# Patient Record
Sex: Male | Born: 1945
Health system: Southern US, Community
[De-identification: ages and names within clinical notes are randomized; demographics above are authoritative.]

## PROBLEM LIST (undated history)

## (undated) DIAGNOSIS — Z8673 Personal history of transient ischemic attack (TIA), and cerebral infarction without residual deficits: Secondary | ICD-10-CM

## (undated) DIAGNOSIS — E785 Hyperlipidemia, unspecified: Secondary | ICD-10-CM

## (undated) DIAGNOSIS — M47812 Spondylosis without myelopathy or radiculopathy, cervical region: Secondary | ICD-10-CM

## (undated) DIAGNOSIS — Z8669 Personal history of other diseases of the nervous system and sense organs: Secondary | ICD-10-CM

## (undated) DIAGNOSIS — D151 Benign neoplasm of heart: Secondary | ICD-10-CM

## (undated) DIAGNOSIS — C439 Malignant melanoma of skin, unspecified: Secondary | ICD-10-CM

## (undated) DIAGNOSIS — I10 Essential (primary) hypertension: Secondary | ICD-10-CM

## (undated) DIAGNOSIS — Z8679 Personal history of other diseases of the circulatory system: Secondary | ICD-10-CM

## (undated) HISTORY — PX: BACK SURGERY: SHX140

## (undated) HISTORY — PX: APPENDECTOMY: SHX54

## (undated) HISTORY — DX: Personal history of other diseases of the nervous system and sense organs: Z86.69

## (undated) HISTORY — PX: ROTATOR CUFF REPAIR: SHX139

## (undated) HISTORY — PX: NOSE SURGERY: SHX723

## (undated) HISTORY — DX: Spondylosis without myelopathy or radiculopathy, cervical region: M47.812

## (undated) HISTORY — DX: Hyperlipidemia, unspecified: E78.5

## (undated) HISTORY — DX: Essential (primary) hypertension: I10

## (undated) HISTORY — DX: Personal history of other diseases of the circulatory system: Z86.79

## (undated) HISTORY — PX: NASAL SEPTUM SURGERY: SHX37

---

## 2000-04-27 ENCOUNTER — Encounter: Payer: Self-pay | Admitting: Orthopedic Surgery

## 2000-04-27 ENCOUNTER — Ambulatory Visit (HOSPITAL_COMMUNITY): Admission: RE | Admit: 2000-04-27 | Discharge: 2000-04-27 | Payer: Self-pay | Admitting: Orthopedic Surgery

## 2000-06-13 ENCOUNTER — Ambulatory Visit (HOSPITAL_COMMUNITY): Admission: RE | Admit: 2000-06-13 | Discharge: 2000-06-13 | Payer: Self-pay | Admitting: Orthopedic Surgery

## 2000-06-13 ENCOUNTER — Encounter: Payer: Self-pay | Admitting: Orthopedic Surgery

## 2000-06-20 ENCOUNTER — Ambulatory Visit (HOSPITAL_COMMUNITY): Admission: RE | Admit: 2000-06-20 | Discharge: 2000-06-20 | Payer: Self-pay | Admitting: Orthopedic Surgery

## 2000-06-20 ENCOUNTER — Encounter: Payer: Self-pay | Admitting: Orthopedic Surgery

## 2000-07-24 ENCOUNTER — Inpatient Hospital Stay (HOSPITAL_COMMUNITY): Admission: RE | Admit: 2000-07-24 | Discharge: 2000-07-25 | Payer: Self-pay | Admitting: Neurosurgery

## 2000-07-24 ENCOUNTER — Encounter: Payer: Self-pay | Admitting: Neurosurgery

## 2000-08-09 ENCOUNTER — Encounter: Admission: RE | Admit: 2000-08-09 | Discharge: 2000-08-09 | Payer: Self-pay | Admitting: Neurosurgery

## 2000-08-09 ENCOUNTER — Encounter: Payer: Self-pay | Admitting: Neurosurgery

## 2001-01-03 ENCOUNTER — Inpatient Hospital Stay (HOSPITAL_COMMUNITY): Admission: AD | Admit: 2001-01-03 | Discharge: 2001-01-05 | Payer: Self-pay | Admitting: Orthopedic Surgery

## 2003-05-21 ENCOUNTER — Ambulatory Visit (HOSPITAL_COMMUNITY): Admission: RE | Admit: 2003-05-21 | Discharge: 2003-05-21 | Payer: Self-pay | Admitting: Neurosurgery

## 2007-04-19 ENCOUNTER — Encounter: Admission: RE | Admit: 2007-04-19 | Discharge: 2007-04-19 | Payer: Self-pay | Admitting: Neurosurgery

## 2008-05-20 ENCOUNTER — Encounter: Admission: RE | Admit: 2008-05-20 | Discharge: 2008-05-20 | Payer: Self-pay | Admitting: Neurosurgery

## 2008-06-09 ENCOUNTER — Encounter: Admission: RE | Admit: 2008-06-09 | Discharge: 2008-06-09 | Payer: Self-pay | Admitting: Neurosurgery

## 2008-06-29 ENCOUNTER — Encounter: Admission: RE | Admit: 2008-06-29 | Discharge: 2008-06-29 | Payer: Self-pay | Admitting: Neurosurgery

## 2009-03-12 ENCOUNTER — Encounter: Admission: RE | Admit: 2009-03-12 | Discharge: 2009-03-12 | Payer: Self-pay | Admitting: Neurosurgery

## 2009-05-06 ENCOUNTER — Encounter: Admission: RE | Admit: 2009-05-06 | Discharge: 2009-05-06 | Payer: Self-pay | Admitting: Neurosurgery

## 2009-05-26 ENCOUNTER — Encounter: Admission: RE | Admit: 2009-05-26 | Discharge: 2009-05-26 | Payer: Self-pay | Admitting: Neurosurgery

## 2009-06-06 ENCOUNTER — Encounter: Admission: RE | Admit: 2009-06-06 | Discharge: 2009-06-06 | Payer: Self-pay | Admitting: Neurosurgery

## 2009-07-07 ENCOUNTER — Inpatient Hospital Stay (HOSPITAL_COMMUNITY): Admission: RE | Admit: 2009-07-07 | Discharge: 2009-07-08 | Payer: Self-pay | Admitting: Neurosurgery

## 2010-03-22 ENCOUNTER — Ambulatory Visit: Payer: Self-pay | Admitting: Cardiology

## 2010-09-20 ENCOUNTER — Ambulatory Visit (INDEPENDENT_AMBULATORY_CARE_PROVIDER_SITE_OTHER): Payer: Medicare Other | Admitting: Cardiology

## 2010-09-20 DIAGNOSIS — I119 Hypertensive heart disease without heart failure: Secondary | ICD-10-CM

## 2010-09-20 DIAGNOSIS — E7800 Pure hypercholesterolemia, unspecified: Secondary | ICD-10-CM

## 2010-09-20 DIAGNOSIS — Z79899 Other long term (current) drug therapy: Secondary | ICD-10-CM

## 2010-10-10 LAB — URINALYSIS, MICROSCOPIC ONLY
Leukocytes, UA: NEGATIVE
Protein, ur: NEGATIVE mg/dL
Specific Gravity, Urine: 1.008 (ref 1.005–1.030)
pH: 6.5 (ref 5.0–8.0)

## 2010-10-10 LAB — COMPREHENSIVE METABOLIC PANEL
ALT: 29 U/L (ref 0–53)
AST: 21 U/L (ref 0–37)
BUN: 10 mg/dL (ref 6–23)
Calcium: 9.1 mg/dL (ref 8.4–10.5)
Chloride: 103 mEq/L (ref 96–112)
Creatinine, Ser: 0.84 mg/dL (ref 0.4–1.5)
GFR calc Af Amer: >60 mL/min (ref >60)
Glucose, Bld: 107 mg/dL — ABNORMAL HIGH (ref 70–99)
Potassium: 4.3 mEq/L (ref 3.5–5.1)
Sodium: 139 mEq/L (ref 135–145)
Total Bilirubin: 0.7 mg/dL (ref 0.3–1.2)

## 2010-10-10 LAB — DIFFERENTIAL
Eosinophils Absolute: 0.1 10*3/uL (ref 0.0–0.7)
Eosinophils Relative: 2 % (ref 0–5)
Lymphocytes Relative: 22 % (ref 12–46)
Neutro Abs: 4.5 10*3/uL (ref 1.7–7.7)
Neutrophils Relative %: 67 % (ref 43–77)

## 2010-10-10 LAB — CBC
Platelets: 183 10*3/uL (ref 150–400)
RDW: 14.1 % (ref 11.5–15.5)

## 2010-11-25 NOTE — H&P (Signed)
[place]. [facility]  Patient:    Matthew Sullivan, Matthew Sullivan                        MRN: [id number] Adm. Date:  07/24/00 Attending:  Payton Doughty, M.D.                         History and Physical  ADMISSION DIAGNOSIS:  Cervical spondylosis with myelopathy at C3-4, C4-5, and C5-6.  HISTORY OF PRESENT ILLNESS:  A 65 year old right-handed white gentleman who for the past three months has increasing discomfort of his left trapezius, has shoulder discomfort that has been tended to by Dr. Teressa Senter.  During the course of his investigation of his shoulder pain, he had an MRI of the neck which showed severe spondylitic change with cord compression and he has referred himself to me.  PAST MEDICAL HISTORY:  Otherwise reasonably unremarkable save for an appendectomy in 1989 and some attempts to cut his fingers off with a saw. This was attended to by Dr. Teressa Senter.  ALLERGIES:  No known drug allergies.  MEDICATIONS:  He is currently using Celebrex as needed and Vicodin on p.r.n. basis.  FAMILY HISTORY:  Mother is 22 and in good health with spondylitic disease in her lumbar spine.  Father is deceased, cause is not given.  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  REVIEW OF SYSTEMS:  Remarkable for shoulder pain.  He wears glasses, has neck pain, and notices his arm strength is going away.  HEENT:  Within normal limits.  He has limited range of motion in his neck, although, there is no Lhermittes.  CHEST:  Clear.  HEART:  Regular rate and rhythm.  ABDOMEN:  Nontender with no hepatosplenomegaly.  EXTREMITIES:  Without clubbing or cyanosis.  GENITOURINARY:  Deferred.  EXTREMITIES:  Peripheral pulses are good.  NEUROLOGICAL:  He is awake, alert, and oriented.  His Cranial nerves II-XII intact.  Motor examination shows 5/5 strength throughout the upper and lower extremities save for the left biceps which is 4+-5-/5.   He has upgoing toe on the right at this time, previously he did not.  Hoffmans is positive.  MRI shows an 8 mm canal at 3-4, 4-5, and 6 mm canal at 5-6.  6-7 is in good shape as well as 2-3.  IMPRESSION:  Cervical spondylitic disease with myelomalacia and myelopathy. The plan is for an anterior cervical diskectomy and fusion at C3-4, C4-5, and C5-6.  The risks and benefits of this approach had been discussed with him and he wishes to proceed. DD:  07/24/00 TD:  07/24/00 Job: [id number]

## 2010-11-25 NOTE — Op Note (Signed)
[place]. [facility]  Patient:    Matthew Sullivan, Matthew Sullivan                      MRN: [id number] Proc. Date: 07/24/00 Adm. Date:  [id number] Attending:  Emeterio Reeve                           Operative Report  PREOPERATIVE DIAGNOSIS:  Spondylosis with cord compression at C3-4, C4-5 and C5-6.  POSTOPERATIVE DIAGNOSIS:  Spondylosis with cord compression at C3-4, C4-5 and C5-6.  OPERATION PERFORMED:  Anterior cervical diskectomy and fusion at C3-4, C4-5 and C5-6 with a tether plate.  SURGEON:  Payton Doughty, M.D.  ANESTHESIA:  General endotracheal.  PREP:  Sterile Betadine prep and scrub with alcohol wipe.  COMPLICATIONS:  None.  ASSISTANT:  Kyle L. Cabbell, M.D.  DESCRIPTION OF PROCEDURE:  The patient is a 65 year old, right-handed, white gentleman with severe cervical spondylosis at 3-4, 4-5 and 5-6. The patient was taken to the operating room, smoothly anesthetized and intubated, and placed supine on the operating table.  Following shave, prep and drape in the usual sterile fashion.  The skin was incised starting one fingerbreadth below the level of the carotid tubercle extending parallel to the sternocleidomastoid muscle for approximately 10 cm.  The platysma was identified, elevated, divided and undermined.  The sternocleidomastoid was identified and medial dissection revealed the carotid artery to retract lateral to the left, trachea and esophagus retracted laterally to the right, exposing the bones of the anterior cervical spine.  Marker was placed and intraoperative x-ray obtained correctness of level.  The longus colli was taken down and the self-retaining retractor Shadowline retractor placed. Diskectomy was carried out at C3-4, C4-5 and C5-6, first under gross observation and then under the operating microscope.  The neural foramina and then epidural space at each level was carefully explored.  The posterior longitudinal ligament was divided.  At  C3-4 there was disk into the anterior epidural space which was removed without difficulty.  At 4-5 and 5-6, there was severe spondylosis with biforaminal compression.  The osteophytes were removed from the neural foramina.  Following complete decompression of nerves roots at all three levels, a 7 mm bone graft was fashioned from patellar allograft and tapped into place.  Following  placement of bone graft, a 40 mm tether plate was then placed with two screws in C3, one in C4, one in C5 and two in C6.  Intraoperative x-ray showed good placement of bone grafts, plate and screws.  The wound was irrigated and hemostasis assured.  The platysma was reapproximated with 3-0 Vicryl in interrupted fashion.  Subcutaneous tissues were reapproximated with 3-0 Vicryl in interrupted fashion.  The skin was closed with 4-0 Vicryl in a running subcuticular fashion.  Benzoin and Steri-Strips were placed and made occlusive with Telfa and Op-Site.  The patient was then placed in an Aspen collar and returned to the recovery room in good condition. DD:  07/24/00 TD:  07/24/00 Job: [id number]

## 2011-02-08 ENCOUNTER — Other Ambulatory Visit: Payer: Self-pay | Admitting: Cardiology

## 2011-02-08 DIAGNOSIS — E785 Hyperlipidemia, unspecified: Secondary | ICD-10-CM

## 2011-02-09 ENCOUNTER — Other Ambulatory Visit: Payer: Self-pay | Admitting: *Deleted

## 2011-02-09 DIAGNOSIS — E7800 Pure hypercholesterolemia, unspecified: Secondary | ICD-10-CM

## 2011-02-09 MED ORDER — ATORVASTATIN CALCIUM 80 MG PO TABS: ORAL_TABLET | ORAL | Status: DC

## 2011-02-09 NOTE — Telephone Encounter (Signed)
Refilled generic lipitor 

## 2011-02-20 ENCOUNTER — Other Ambulatory Visit: Payer: Self-pay | Admitting: Neurosurgery

## 2011-02-20 DIAGNOSIS — M47816 Spondylosis without myelopathy or radiculopathy, lumbar region: Secondary | ICD-10-CM

## 2011-03-08 ENCOUNTER — Other Ambulatory Visit: Payer: Medicare Other

## 2011-03-09 ENCOUNTER — Ambulatory Visit
Admission: RE | Admit: 2011-03-09 | Discharge: 2011-03-09 | Disposition: A | Discharge status: Home / Self Care | Payer: Medicare Other | Source: Ambulatory Visit | Attending: Neurosurgery | Admitting: Neurosurgery

## 2011-03-09 DIAGNOSIS — M47816 Spondylosis without myelopathy or radiculopathy, lumbar region: Secondary | ICD-10-CM

## 2011-03-14 ENCOUNTER — Other Ambulatory Visit: Payer: Self-pay | Admitting: Cardiology

## 2011-03-14 DIAGNOSIS — I10 Essential (primary) hypertension: Secondary | ICD-10-CM

## 2011-03-14 DIAGNOSIS — E785 Hyperlipidemia, unspecified: Secondary | ICD-10-CM

## 2011-04-20 ENCOUNTER — Ambulatory Visit: Payer: Medicare Other | Admitting: Cardiology

## 2011-04-20 ENCOUNTER — Other Ambulatory Visit: Payer: Medicare Other | Admitting: *Deleted

## 2011-05-04 ENCOUNTER — Encounter: Payer: Self-pay | Admitting: Cardiology

## 2011-05-08 ENCOUNTER — Ambulatory Visit: Payer: Medicare Other | Admitting: Cardiology

## 2011-05-08 ENCOUNTER — Other Ambulatory Visit: Payer: Medicare Other | Admitting: *Deleted

## 2011-06-22 ENCOUNTER — Encounter: Payer: Self-pay | Admitting: Cardiology

## 2011-06-22 ENCOUNTER — Ambulatory Visit
Admission: RE | Admit: 2011-06-22 | Discharge: 2011-06-22 | Disposition: A | Discharge status: Home / Self Care | Payer: Medicare Other | Source: Ambulatory Visit | Attending: Cardiology | Admitting: Cardiology

## 2011-06-22 ENCOUNTER — Other Ambulatory Visit (INDEPENDENT_AMBULATORY_CARE_PROVIDER_SITE_OTHER): Payer: Medicare Other | Admitting: *Deleted

## 2011-06-22 ENCOUNTER — Ambulatory Visit (INDEPENDENT_AMBULATORY_CARE_PROVIDER_SITE_OTHER): Payer: Medicare Other | Admitting: Cardiology

## 2011-06-22 VITALS — BP 128/78 | HR 60 | Ht 72.0 in | Wt 208.0 lb

## 2011-06-22 DIAGNOSIS — I119 Hypertensive heart disease without heart failure: Secondary | ICD-10-CM

## 2011-06-22 DIAGNOSIS — E7800 Pure hypercholesterolemia, unspecified: Secondary | ICD-10-CM | POA: Insufficient documentation

## 2011-06-22 LAB — BASIC METABOLIC PANEL
BUN: 14 mg/dL (ref 6–23)
CO2: 29 mEq/L (ref 19–32)
Chloride: 102 mEq/L (ref 96–112)
GFR: 103.97 mL/min (ref >60.00)
Glucose, Bld: 99 mg/dL (ref 70–99)
Sodium: 140 mEq/L (ref 135–145)

## 2011-06-22 LAB — LIPID PANEL
HDL: 45.9 mg/dL (ref >39.00)
Triglycerides: 84 mg/dL (ref 0.0–149.0)
VLDL: 16.8 mg/dL (ref 0.0–40.0)

## 2011-06-22 LAB — HEPATIC FUNCTION PANEL
AST: 20 U/L (ref 0–37)
Albumin: 4.3 g/dL (ref 3.5–5.2)
Alkaline Phosphatase: 82 U/L (ref 39–117)
Bilirubin, Direct: 0.1 mg/dL (ref 0.0–0.3)
Total Protein: 7.3 g/dL (ref 6.0–8.3)

## 2011-06-22 NOTE — Progress Notes (Signed)
Matthew Sullivan Date of Birth:  Sep 15, 1945 [facility] [address]   [address] [place], [place]  [address] [phone]           Fax   [phone]  HPI: This pleasant 65 year old gentleman is seen for a six-month followup office visit.  He has a history of essential hypertension and history of hypercholesterolemia.  Since last visit has had no new cardiac symptoms.  He denies any chest pain or shortness of breath.  He exercises regularly by hiking in the woods.  He is not having any side effects from his hydrochlorothiazide or Lipitor.  He also remains on a daily aspirin.  Current Outpatient Prescriptions  Medication Sig Dispense Refill  . aspirin 81 MG tablet Take 81 mg by mouth daily.        Marland Kitchen atorvastatin (LIPITOR) 80 MG tablet Takes 1/2 daily  30 tablet  11  . hydrochlorothiazide (MICROZIDE) 12.5 MG capsule Take 12.5 mg by mouth daily.        . Multiple Vitamin (MULTIVITAMIN) tablet Take 1 tablet by mouth daily.          No Known Allergies  There is no problem list on file for this patient.   History  Smoking status  . Former Smoker  . Quit date: 07/10/1977  Smokeless tobacco  . Not on file    History  Alcohol Use No    Family History  Problem Relation Age of Onset  . Cancer Father     Review of Systems: The patient denies any heat or cold intolerance.  No weight gain or weight loss.  The patient denies headaches or blurry vision.  There is no cough or sputum production.  The patient denies dizziness.  There is no hematuria or hematochezia.  The patient denies any muscle aches or arthritis.  The patient denies any rash.  The patient denies frequent falling or instability.  There is no history of depression or anxiety.  All other systems were reviewed and are negative.   Physical Exam: Filed Vitals:   06/22/11 1105  BP: 128/78  Pulse: 60   The general appearance reveals a well-developed well-nourished gentleman in no distress.The  head and neck exam reveals pupils equal and reactive.  Extraocular movements are full.  There is no scleral icterus.  The mouth and pharynx are normal.  The neck is supple.  The carotids reveal no bruits.  The jugular venous pressure is normal.  The  thyroid is not enlarged.  There is no lymphadenopathy.  The chest is clear to percussion and auscultation.  There are no rales or rhonchi.  Expansion of the chest is symmetrical.  The precordium is quiet.  The first heart sound is normal.  The second heart sound is physiologically split.  There is no murmur gallop rub or click.  There is no abnormal lift or heave.  The abdomen is soft and nontender.  The bowel sounds are normal.  The liver and spleen are not enlarged.  There are no abdominal masses.  There are no abdominal bruits.  Extremities reveal good pedal pulses.  There is no phlebitis or edema.  There is no cyanosis or clubbing.  Strength is normal and symmetrical in all extremities.  There is no lateralizing weakness.  There are no sensory deficits.  The skin is warm and dry.  There is no rash.     Assessment / Plan: Continue present regimen.  Blood work today is pending.  His  last chest x-ray was in 2007 and we are getting a followup chest x-ray today to followup on heart size.

## 2011-06-22 NOTE — Patient Instructions (Addendum)
Will have you get chest xray and call you with the results.  Your physician recommends that you continue on your current medications as directed. Please refer to the Current Medication list given to you today.  Your physician wants you to follow-up in: 6 months You will receive a reminder letter in the mail two months in advance. If you don't receive a letter, please call our office to schedule the follow-up appointment.

## 2011-06-22 NOTE — Assessment & Plan Note (Signed)
Patient remains on Lipitor.  He is having no side effects.  Blood work today is pending

## 2011-06-22 NOTE — Assessment & Plan Note (Signed)
The patient denies any headaches or dizzy spells.  No exertional chest pain or shortness of breath.

## 2011-06-23 ENCOUNTER — Telehealth: Payer: Self-pay | Admitting: *Deleted

## 2011-06-23 NOTE — Telephone Encounter (Signed)
Message copied by Eugenia Pancoast on Fri Jun 23, 2011  5:20 PM ------      Message from: Cassell Clement      Created: Thu Jun 22, 2011  8:46 PM       Xray okay.  Lungs clear. Heart okay.

## 2011-06-23 NOTE — Telephone Encounter (Signed)
Adv patient of labs

## 2011-06-23 NOTE — Telephone Encounter (Signed)
Adv patient of cxr report

## 2011-06-23 NOTE — Telephone Encounter (Signed)
Adv patient of labs 

## 2011-06-23 NOTE — Telephone Encounter (Signed)
Message copied by Eugenia Pancoast on Fri Jun 23, 2011  5:18 PM ------      Message from: Cassell Clement      Created: Thu Jun 22, 2011  8:38 PM       Please report.  The labs are stable.  Continue same meds.  Continue careful diet.

## 2011-06-23 NOTE — Telephone Encounter (Signed)
Message copied by Eugenia Pancoast on Fri Jun 23, 2011  5:21 PM ------      Message from: Cassell Clement      Created: Thu Jun 22, 2011  8:45 PM       Please report.  The labs are stable.  Continue same meds.  Continue careful diet.

## 2011-08-01 ENCOUNTER — Ambulatory Visit: Payer: Medicare Other | Admitting: Family Medicine

## 2011-10-16 DIAGNOSIS — Z85820 Personal history of malignant melanoma of skin: Secondary | ICD-10-CM | POA: Diagnosis not present

## 2011-10-16 DIAGNOSIS — L570 Actinic keratosis: Secondary | ICD-10-CM | POA: Diagnosis not present

## 2011-11-06 ENCOUNTER — Other Ambulatory Visit: Payer: Self-pay | Admitting: Cardiology

## 2011-12-22 ENCOUNTER — Ambulatory Visit: Payer: Medicare Other | Admitting: Cardiology

## 2011-12-28 DIAGNOSIS — M47817 Spondylosis without myelopathy or radiculopathy, lumbosacral region: Secondary | ICD-10-CM | POA: Diagnosis not present

## 2012-01-01 ENCOUNTER — Other Ambulatory Visit: Payer: Self-pay | Admitting: Neurosurgery

## 2012-01-01 DIAGNOSIS — M47816 Spondylosis without myelopathy or radiculopathy, lumbar region: Secondary | ICD-10-CM

## 2012-01-05 ENCOUNTER — Other Ambulatory Visit: Payer: Self-pay | Admitting: Neurosurgery

## 2012-01-05 ENCOUNTER — Ambulatory Visit
Admission: RE | Admit: 2012-01-05 | Discharge: 2012-01-05 | Disposition: A | Discharge status: Home / Self Care | Payer: Medicare Other | Source: Ambulatory Visit | Attending: Neurosurgery | Admitting: Neurosurgery

## 2012-01-05 VITALS — BP 146/71 | HR 61

## 2012-01-05 DIAGNOSIS — M47816 Spondylosis without myelopathy or radiculopathy, lumbar region: Secondary | ICD-10-CM

## 2012-01-05 DIAGNOSIS — M47817 Spondylosis without myelopathy or radiculopathy, lumbosacral region: Secondary | ICD-10-CM | POA: Diagnosis not present

## 2012-01-05 MED ORDER — METHYLPREDNISOLONE ACETATE 40 MG/ML INJ SUSP (RADIOLOG
120.0000 mg | Freq: Once | INTRAMUSCULAR | Status: AC
  Administered 2012-01-05: 120 mg via EPIDURAL

## 2012-01-05 MED ORDER — IOHEXOL 180 MG/ML  SOLN
1.0000 mL | Freq: Once | INTRAMUSCULAR | Status: AC | PRN
  Administered 2012-01-05: 1 mL via EPIDURAL

## 2012-01-05 NOTE — Discharge Instructions (Signed)

## 2012-01-18 ENCOUNTER — Ambulatory Visit: Payer: Medicare Other | Admitting: Cardiology

## 2012-02-01 ENCOUNTER — Encounter: Payer: Self-pay | Admitting: Cardiology

## 2012-02-01 ENCOUNTER — Ambulatory Visit (INDEPENDENT_AMBULATORY_CARE_PROVIDER_SITE_OTHER): Payer: Medicare Other | Admitting: Cardiology

## 2012-02-01 VITALS — BP 130/86 | HR 60 | Ht 72.0 in | Wt 216.0 lb

## 2012-02-01 DIAGNOSIS — R5381 Other malaise: Secondary | ICD-10-CM

## 2012-02-01 DIAGNOSIS — E785 Hyperlipidemia, unspecified: Secondary | ICD-10-CM

## 2012-02-01 DIAGNOSIS — E7800 Pure hypercholesterolemia, unspecified: Secondary | ICD-10-CM | POA: Diagnosis not present

## 2012-02-01 DIAGNOSIS — I10 Essential (primary) hypertension: Secondary | ICD-10-CM

## 2012-02-01 DIAGNOSIS — R5383 Other fatigue: Secondary | ICD-10-CM

## 2012-02-01 DIAGNOSIS — I119 Hypertensive heart disease without heart failure: Secondary | ICD-10-CM

## 2012-02-01 LAB — CBC WITH DIFFERENTIAL/PLATELET
Basophils Relative: 0.4 % (ref 0.0–3.0)
Hemoglobin: 13.2 g/dL (ref 13.0–17.0)
Lymphocytes Relative: 21.9 % (ref 12.0–46.0)
MCHC: 33 g/dL (ref 30.0–36.0)
Monocytes Relative: 9.4 % (ref 3.0–12.0)
Neutro Abs: 4.1 10*3/uL (ref 1.4–7.7)
RBC: 4.36 Mil/uL (ref 4.22–5.81)

## 2012-02-01 LAB — BASIC METABOLIC PANEL
Calcium: 9.1 mg/dL (ref 8.4–10.5)
GFR: 87.05 mL/min (ref >60.00)
Glucose, Bld: 122 mg/dL — ABNORMAL HIGH (ref 70–99)
Sodium: 137 mEq/L (ref 135–145)

## 2012-02-01 LAB — HEPATIC FUNCTION PANEL
Albumin: 4.2 g/dL (ref 3.5–5.2)
Alkaline Phosphatase: 64 U/L (ref 39–117)

## 2012-02-01 LAB — LIPID PANEL
HDL: 47.4 mg/dL (ref >39.00)
VLDL: 24.2 mg/dL (ref 0.0–40.0)

## 2012-02-01 LAB — TSH: TSH: 3.06 u[IU]/mL (ref 0.35–5.50)

## 2012-02-01 NOTE — Assessment & Plan Note (Signed)
Patient is on Lipitor 80 mg daily.  He is not having any myalgias.  Blood work today is pending.  Continue same dose for now.

## 2012-02-01 NOTE — Progress Notes (Signed)
Quick Note:  Please make copy of labs for patient visit. Thyroid function is normal. There is no anemia. No cause for his fatigue noted in the lab work ______

## 2012-02-01 NOTE — Patient Instructions (Signed)
Will obtain labs today and call you with the results   Your physician recommends that you continue on your current medications as directed. Please refer to the Current Medication list given to you today.  Your physician wants you to follow-up in: 6 months with fasting labs (lp/bmet/hfp)  You will receive a reminder letter in the mail two months in advance. If you don't receive a letter, please call our office to schedule the follow-up appointment.  

## 2012-02-01 NOTE — Assessment & Plan Note (Signed)
His blood pressure is higher today.  This reflects his weight gain and decreased exercise regimen.  We talked about adding a ACE inhibitor but he would prefer not to add any additional drugs at this time but he will work harder on exercise diet and weight loss instead.

## 2012-02-01 NOTE — Progress Notes (Signed)
Matthew Sullivan Radon Date of Birth:  10-22-1945 [facility] [address] [place], [place]  [address] [phone]  Fax   [phone]  HPI: This pleasant 66 year old gentleman is seen for a six-month followup office visit.  He has a past history of hypercholesterolemia and essential hypertension.  He also has a history of chronic back pain.  Last year he had surgery on L4.  Now he is having additional pain coming from L5.  He had a injection of steroids about 6 weeks ago.  He sees Dr. Channing Mutters.  The patient is hoping to avoid further surgery.  Because of his back pain his exercise regimen has been decreased and he has gained 8 pounds.  He complains of malaise fatigue and lack of energy and we are going to check TSH and CBC today as well as his fasting labs.  The patient is also concerned that he may have low testosterone level.  We had checked it several years ago and it was normal at bedtime.  Medicare will not pay for it from this office.  He may want to see urology for further evaluation.  Current Outpatient Prescriptions  Medication Sig Dispense Refill  . aspirin 81 MG tablet Take 81 mg by mouth daily.        Marland Kitchen atorvastatin (LIPITOR) 80 MG tablet Takes 1/2 daily  30 tablet  11  . hydrochlorothiazide (MICROZIDE) 12.5 MG capsule TAKE ONE CAPSULE DAILY FOR BLOOD PRESSURE.  30 capsule  11  . Multiple Vitamin (MULTIVITAMIN) tablet Take 1 tablet by mouth daily.          No Known Allergies  Patient Active Problem List  Diagnosis  . Benign hypertensive heart disease without heart failure  . Pure hypercholesterolemia    History  Smoking status  . Former Smoker  . Quit date: 07/10/1977  Smokeless tobacco  . Not on file    History  Alcohol Use No    Family History  Problem Relation Age of Onset  . Cancer Father     Review of Systems: The patient denies any heat or cold intolerance.  No weight gain or weight loss.  The patient denies headaches or blurry vision.   There is no cough or sputum production.  The patient denies dizziness.  There is no hematuria or hematochezia.  The patient denies any muscle aches or arthritis.  The patient denies any rash.  The patient denies frequent falling or instability.  There is no history of depression or anxiety.  All other systems were reviewed and are negative.   Physical Exam: Filed Vitals:   02/01/12 0908  BP: 130/86  Pulse: 60   the general appearance reveals a well-developed well-nourished gentleman in no distress.The head and neck exam reveals pupils equal and reactive.  Extraocular movements are full.  There is no scleral icterus.  The mouth and pharynx are normal.  The neck is supple.  The carotids reveal no bruits.  The jugular venous pressure is normal.  The  thyroid is not enlarged.  There is no lymphadenopathy.  The chest is clear to percussion and auscultation.  There are no rales or rhonchi.  Expansion of the chest is symmetrical.  The precordium is quiet.  The first heart sound is normal.  The second heart sound is physiologically split.  There is no murmur gallop rub or click.  There is no abnormal lift or heave.  The abdomen is soft and nontender.  The bowel sounds are normal.  The  liver and spleen are not enlarged.  There are no abdominal masses.  There are no abdominal bruits.  Extremities reveal good pedal pulses.  There is no phlebitis or edema.  There is no cyanosis or clubbing.  Strength is normal and symmetrical in all extremities.  There is no lateralizing weakness.  There are no sensory deficits.  The skin is warm and dry.  There is no rash.      Assessment / Plan: Continue same medication.  Work harder on weight loss.  Continue walking.  Recheck 6 months for followup office visit lipid panel hepatic function panel and basal metabolic panel.  Today because of his malaise and fatigue we are also checking CBC and TSH

## 2012-02-02 ENCOUNTER — Telehealth: Payer: Self-pay | Admitting: *Deleted

## 2012-02-02 NOTE — Telephone Encounter (Signed)
Message copied by Burnell Blanks on Fri Feb 02, 2012  2:15 PM ------      Message from: Cassell Clement      Created: Thu Feb 01, 2012  4:32 PM       Please make copy of labs for patient visit.  Thyroid function is normal.  There is no anemia.  No cause for his fatigue noted in the lab work

## 2012-02-02 NOTE — Telephone Encounter (Signed)
Mailed copy of labs and left message to call if any questions  

## 2012-03-27 ENCOUNTER — Telehealth: Payer: Self-pay | Admitting: Cardiology

## 2012-03-27 ENCOUNTER — Other Ambulatory Visit: Payer: Self-pay | Admitting: Cardiology

## 2012-03-27 NOTE — Telephone Encounter (Signed)
Spoke with wife and she stated patients fatigue is getting worse not better.   Per wife patient doesn't really snore, but wakes up tired.  She doesn't know what to do next.  Will forward to  Dr. [name] for review aware it will be next week.   Dr. [name] is PCP

## 2012-03-27 NOTE — Telephone Encounter (Signed)
New Problem:    PAtient's wife called in with questions regarding the patient's labs form 02/01/12.  Please call back.

## 2012-04-01 NOTE — Telephone Encounter (Signed)
Advised patients wife of changes. After reviewing medications notice his HCTZ is a capsule that he takes daily.  Will forward to  Dr. [name] for review

## 2012-04-01 NOTE — Telephone Encounter (Signed)
Left message to call back  

## 2012-04-01 NOTE — Telephone Encounter (Signed)
Would decrease the present dose of HCTZ by half. If he is taking one daily, change to QOD

## 2012-04-01 NOTE — Telephone Encounter (Signed)
His fatigue may be coming from some of his medication.  The elevated blood sugar may be secondary in part to the hydrochlorothiazide.  The Lipitor may also be causing some fatigue.  I want him to decrease his Lipitor to just one half tablet every other day.  I want him to decrease his hydrochlorothiazide to one half tablet every other day.  Watch carbohydrates carefully in his diet and try to lose weight.

## 2012-04-02 NOTE — Telephone Encounter (Signed)
Advised wife  

## 2012-04-08 DIAGNOSIS — M47817 Spondylosis without myelopathy or radiculopathy, lumbosacral region: Secondary | ICD-10-CM | POA: Diagnosis not present

## 2012-04-08 DIAGNOSIS — M47812 Spondylosis without myelopathy or radiculopathy, cervical region: Secondary | ICD-10-CM | POA: Diagnosis not present

## 2012-04-16 DIAGNOSIS — Z85820 Personal history of malignant melanoma of skin: Secondary | ICD-10-CM | POA: Diagnosis not present

## 2012-04-16 DIAGNOSIS — D235 Other benign neoplasm of skin of trunk: Secondary | ICD-10-CM | POA: Diagnosis not present

## 2012-08-15 DIAGNOSIS — M47817 Spondylosis without myelopathy or radiculopathy, lumbosacral region: Secondary | ICD-10-CM | POA: Diagnosis not present

## 2012-08-15 DIAGNOSIS — M47812 Spondylosis without myelopathy or radiculopathy, cervical region: Secondary | ICD-10-CM | POA: Diagnosis not present

## 2012-08-26 DIAGNOSIS — M6530 Trigger finger, unspecified finger: Secondary | ICD-10-CM | POA: Diagnosis not present

## 2012-11-12 ENCOUNTER — Other Ambulatory Visit: Payer: Self-pay | Admitting: *Deleted

## 2012-11-12 MED ORDER — HYDROCHLOROTHIAZIDE 12.5 MG PO CAPS: 12.5000 mg | ORAL_CAPSULE | Freq: Every day | ORAL | Status: DC

## 2012-11-12 MED ORDER — CELECOXIB 200 MG PO CAPS: 200.0000 mg | ORAL_CAPSULE | Freq: Every day | ORAL | Status: DC

## 2012-11-12 NOTE — Telephone Encounter (Signed)
Wife requested Rx for Celebrex, ok by  Dr. [name]. Given to wife at her appointment

## 2012-11-15 ENCOUNTER — Other Ambulatory Visit: Payer: Self-pay | Admitting: *Deleted

## 2012-11-15 MED ORDER — HYDROCHLOROTHIAZIDE 12.5 MG PO CAPS: 12.5000 mg | ORAL_CAPSULE | Freq: Every day | ORAL | Status: DC

## 2012-12-24 DIAGNOSIS — Z85820 Personal history of malignant melanoma of skin: Secondary | ICD-10-CM | POA: Diagnosis not present

## 2013-01-23 DIAGNOSIS — H2510 Age-related nuclear cataract, unspecified eye: Secondary | ICD-10-CM | POA: Diagnosis not present

## 2013-01-23 DIAGNOSIS — H40039 Anatomical narrow angle, unspecified eye: Secondary | ICD-10-CM | POA: Diagnosis not present

## 2013-01-24 DIAGNOSIS — H2510 Age-related nuclear cataract, unspecified eye: Secondary | ICD-10-CM | POA: Diagnosis not present

## 2013-03-21 ENCOUNTER — Ambulatory Visit (INDEPENDENT_AMBULATORY_CARE_PROVIDER_SITE_OTHER): Payer: Medicare Other | Admitting: Cardiology

## 2013-03-21 ENCOUNTER — Encounter: Payer: Self-pay | Admitting: Cardiology

## 2013-03-21 VITALS — BP 122/78 | HR 55 | Ht 73.0 in | Wt 213.0 lb

## 2013-03-21 DIAGNOSIS — E7800 Pure hypercholesterolemia, unspecified: Secondary | ICD-10-CM | POA: Diagnosis not present

## 2013-03-21 DIAGNOSIS — I119 Hypertensive heart disease without heart failure: Secondary | ICD-10-CM | POA: Diagnosis not present

## 2013-03-21 LAB — BASIC METABOLIC PANEL
CO2: 30 mEq/L (ref 19–32)
GFR: 99.08 mL/min (ref >60.00)
Glucose, Bld: 93 mg/dL (ref 70–99)
Potassium: 3.8 mEq/L (ref 3.5–5.1)
Sodium: 139 mEq/L (ref 135–145)

## 2013-03-21 LAB — LIPID PANEL
HDL: 42.5 mg/dL (ref >39.00)
LDL Cholesterol: 70 mg/dL (ref 0–99)
Total CHOL/HDL Ratio: 3
VLDL: 13 mg/dL (ref 0.0–40.0)

## 2013-03-21 LAB — HEPATIC FUNCTION PANEL: Total Bilirubin: 1 mg/dL (ref 0.3–1.2)

## 2013-03-21 NOTE — Assessment & Plan Note (Signed)
Patient remains on hydrochlorothiazide 12.5 mg daily for his blood pressure.  No headaches.  No dizzy spells.  Blood pressure remaining stable per.

## 2013-03-21 NOTE — Patient Instructions (Addendum)
Will obtain labs today and call you with the results (LP/BMET/HFP)  Your physician recommends that you continue on your current medications as directed. Please refer to the Current Medication list given to you today.  Your physician wants you to follow-up in: 6 months with fasting labs (lp/bmet/hfp)  You will receive a reminder letter in the mail two months in advance. If you don't receive a letter, please call our office to schedule the follow-up appointment.  

## 2013-03-21 NOTE — Progress Notes (Signed)
Quick Note:  Please report to patient. The recent labs are stable. Continue same medication and careful diet. ______ 

## 2013-03-21 NOTE — Assessment & Plan Note (Signed)
The patient has a past history of hypercholesterolemia and is on Lipitor 80 mg daily.  We're awaiting results of today's labs work.

## 2013-03-21 NOTE — Progress Notes (Signed)
Tahmir Elige Radon Date of Birth:  03/05/1946 [facility] [address] [place], [place]  [address] [phone]  Fax   [phone]  HPI: This pleasant 67 year old gentleman is seen for a six-month followup office visit. He has a past history of hypercholesterolemia and essential hypertension. He also has a history of chronic back pain. Last year he had surgery on L4.  Dr. Trey Sailors is his neurosurgeon. Overall the patient has been feeling well.  No chest pain or cardiac symptoms.  His back pain appears to be better and he has been elevated walk on a regular basis in his neighborhood.  Current Outpatient Prescriptions  Medication Sig Dispense Refill  . aspirin 81 MG tablet Take 81 mg by mouth daily.        Marland Kitchen atorvastatin (LIPITOR) 80 MG tablet 1/2 tab  every  day      . celecoxib (CELEBREX) 200 MG capsule Take 1 capsule (200 mg total) by mouth daily.  90 capsule  3  . hydrochlorothiazide (MICROZIDE) 12.5 MG capsule Take 1 capsule (12.5 mg total) by mouth daily.  90 capsule  0  . Multiple Vitamin (MULTIVITAMIN) tablet Take 1 tablet by mouth daily.         No current facility-administered medications for this visit.    No Known Allergies  Patient Active Problem List   Diagnosis Date Noted  . Benign hypertensive heart disease without heart failure 06/22/2011  . Pure hypercholesterolemia 06/22/2011    History  Smoking status  . Former Smoker  . Quit date: 07/10/1977  Smokeless tobacco  . Not on file    History  Alcohol Use No    Family History  Problem Relation Age of Onset  . Cancer Father     Review of Systems: The patient denies any heat or cold intolerance.  No weight gain or weight loss.  The patient denies headaches or blurry vision.  There is no cough or sputum production.  The patient denies dizziness.  There is no hematuria or hematochezia.  The patient denies any muscle aches or arthritis.  The patient denies any rash.  The patient denies  frequent falling or instability.  There is no history of depression or anxiety.  All other systems were reviewed and are negative.   Physical Exam: Filed Vitals:   03/21/13 1109  BP: 122/78  Pulse: 55   the general appearance reveals a well-developed well-nourished gentleman in no distress.The head and neck exam reveals pupils equal and reactive.  Extraocular movements are full.  There is no scleral icterus.  The mouth and pharynx are normal.  The neck is supple.  The carotids reveal no bruits.  The jugular venous pressure is normal.  The  thyroid is not enlarged.  There is no lymphadenopathy.  The chest is clear to percussion and auscultation.  There are no rales or rhonchi.  Expansion of the chest is symmetrical.  The precordium is quiet.  The first heart sound is normal.  The second heart sound is physiologically split.  There is no murmur gallop rub or click.  There is no abnormal lift or heave.  The abdomen is soft and nontender.  The bowel sounds are normal.  The liver and spleen are not enlarged.  There are no abdominal masses.  There are no abdominal bruits.  Extremities reveal good pedal pulses.  There is no phlebitis or edema.  There is no cyanosis or clubbing.  Strength is normal and symmetrical in all extremities.  There is no lateralizing weakness.  There are no sensory deficits.  The skin is warm and dry.  There is no rash.  EKG shows sinus bradycardia otherwise within normal limits    Assessment / Plan:  continue same medication.  Rec 6 months for office visit and fasting lipid panel, hepatic function panel, and basal metabolic panel.

## 2013-03-26 ENCOUNTER — Telehealth: Payer: Self-pay | Admitting: *Deleted

## 2013-03-26 NOTE — Telephone Encounter (Signed)
Message copied by Burnell Blanks on Wed Mar 26, 2013  8:59 AM ------      Message from: Cassell Clement      Created: Fri Mar 21, 2013  9:21 PM       Please report to patient.  The recent labs are stable. Continue same medication and careful diet. ------

## 2013-03-26 NOTE — Telephone Encounter (Signed)
Advised wife of labs 

## 2013-04-04 ENCOUNTER — Other Ambulatory Visit: Payer: Self-pay | Admitting: Neurosurgery

## 2013-04-04 DIAGNOSIS — M47816 Spondylosis without myelopathy or radiculopathy, lumbar region: Secondary | ICD-10-CM

## 2013-04-05 ENCOUNTER — Emergency Department (HOSPITAL_COMMUNITY): Payer: Medicare Other

## 2013-04-05 ENCOUNTER — Inpatient Hospital Stay (HOSPITAL_COMMUNITY)
Admission: EM | Admit: 2013-04-05 | Discharge: 2013-04-13 | DRG: 228 | Disposition: A | Discharge status: Home / Self Care | Payer: Medicare Other | Attending: Thoracic Surgery (Cardiothoracic Vascular Surgery) | Admitting: Thoracic Surgery (Cardiothoracic Vascular Surgery)

## 2013-04-05 ENCOUNTER — Encounter (HOSPITAL_COMMUNITY): Payer: Self-pay

## 2013-04-05 DIAGNOSIS — D151 Benign neoplasm of heart: Secondary | ICD-10-CM | POA: Diagnosis not present

## 2013-04-05 DIAGNOSIS — Z79899 Other long term (current) drug therapy: Secondary | ICD-10-CM | POA: Diagnosis not present

## 2013-04-05 DIAGNOSIS — D62 Acute posthemorrhagic anemia: Secondary | ICD-10-CM | POA: Diagnosis not present

## 2013-04-05 DIAGNOSIS — Z85820 Personal history of malignant melanoma of skin: Secondary | ICD-10-CM

## 2013-04-05 DIAGNOSIS — E7800 Pure hypercholesterolemia, unspecified: Secondary | ICD-10-CM | POA: Diagnosis not present

## 2013-04-05 DIAGNOSIS — E8779 Other fluid overload: Secondary | ICD-10-CM | POA: Diagnosis not present

## 2013-04-05 DIAGNOSIS — R0609 Other forms of dyspnea: Secondary | ICD-10-CM | POA: Diagnosis not present

## 2013-04-05 DIAGNOSIS — Z7982 Long term (current) use of aspirin: Secondary | ICD-10-CM

## 2013-04-05 DIAGNOSIS — I498 Other specified cardiac arrhythmias: Secondary | ICD-10-CM | POA: Diagnosis present

## 2013-04-05 DIAGNOSIS — R0789 Other chest pain: Secondary | ICD-10-CM

## 2013-04-05 DIAGNOSIS — I10 Essential (primary) hypertension: Secondary | ICD-10-CM | POA: Diagnosis not present

## 2013-04-05 DIAGNOSIS — Q211 Atrial septal defect: Secondary | ICD-10-CM | POA: Diagnosis not present

## 2013-04-05 DIAGNOSIS — Q2111 Secundum atrial septal defect: Secondary | ICD-10-CM

## 2013-04-05 DIAGNOSIS — Z87891 Personal history of nicotine dependence: Secondary | ICD-10-CM

## 2013-04-05 DIAGNOSIS — I214 Non-ST elevation (NSTEMI) myocardial infarction: Secondary | ICD-10-CM | POA: Diagnosis not present

## 2013-04-05 DIAGNOSIS — E876 Hypokalemia: Secondary | ICD-10-CM | POA: Diagnosis not present

## 2013-04-05 DIAGNOSIS — R0989 Other specified symptoms and signs involving the circulatory and respiratory systems: Secondary | ICD-10-CM | POA: Diagnosis not present

## 2013-04-05 DIAGNOSIS — D696 Thrombocytopenia, unspecified: Secondary | ICD-10-CM | POA: Diagnosis not present

## 2013-04-05 DIAGNOSIS — I119 Hypertensive heart disease without heart failure: Secondary | ICD-10-CM | POA: Diagnosis not present

## 2013-04-05 DIAGNOSIS — D487 Neoplasm of uncertain behavior of other specified sites: Secondary | ICD-10-CM | POA: Diagnosis not present

## 2013-04-05 DIAGNOSIS — E785 Hyperlipidemia, unspecified: Secondary | ICD-10-CM | POA: Diagnosis present

## 2013-04-05 DIAGNOSIS — J9819 Other pulmonary collapse: Secondary | ICD-10-CM | POA: Diagnosis not present

## 2013-04-05 DIAGNOSIS — Z8673 Personal history of transient ischemic attack (TIA), and cerebral infarction without residual deficits: Secondary | ICD-10-CM

## 2013-04-05 DIAGNOSIS — I517 Cardiomegaly: Secondary | ICD-10-CM | POA: Diagnosis not present

## 2013-04-05 DIAGNOSIS — R55 Syncope and collapse: Secondary | ICD-10-CM | POA: Diagnosis present

## 2013-04-05 DIAGNOSIS — I252 Old myocardial infarction: Secondary | ICD-10-CM | POA: Diagnosis not present

## 2013-04-05 DIAGNOSIS — R42 Dizziness and giddiness: Secondary | ICD-10-CM | POA: Diagnosis not present

## 2013-04-05 DIAGNOSIS — R404 Transient alteration of awareness: Secondary | ICD-10-CM | POA: Diagnosis not present

## 2013-04-05 DIAGNOSIS — Z452 Encounter for adjustment and management of vascular access device: Secondary | ICD-10-CM | POA: Diagnosis not present

## 2013-04-05 DIAGNOSIS — Z4682 Encounter for fitting and adjustment of non-vascular catheter: Secondary | ICD-10-CM | POA: Diagnosis not present

## 2013-04-05 HISTORY — DX: Benign neoplasm of heart: D15.1

## 2013-04-05 HISTORY — DX: Malignant melanoma of skin, unspecified: C43.9

## 2013-04-05 LAB — BASIC METABOLIC PANEL
BUN: 18 mg/dL (ref 6–23)
Calcium: 9.1 mg/dL (ref 8.4–10.5)
Creatinine, Ser: 1.04 mg/dL (ref 0.50–1.35)
GFR calc Af Amer: 83 mL/min — ABNORMAL LOW (ref >90)
GFR calc non Af Amer: 72 mL/min — ABNORMAL LOW (ref >90)
Potassium: 3.5 mEq/L (ref 3.5–5.1)

## 2013-04-05 LAB — TROPONIN I: Troponin I: 1.9 ng/mL (ref <0.30)

## 2013-04-05 LAB — CBC
MCHC: 35 g/dL (ref 30.0–36.0)
MCV: 88.5 fL (ref 78.0–100.0)
Platelets: 170 10*3/uL (ref 150–400)
RDW: 13.3 % (ref 11.5–15.5)
WBC: 9.7 10*3/uL (ref 4.0–10.5)

## 2013-04-05 LAB — URINALYSIS, ROUTINE W REFLEX MICROSCOPIC
Bilirubin Urine: NEGATIVE
Nitrite: NEGATIVE
Protein, ur: NEGATIVE mg/dL
Specific Gravity, Urine: 1.013 (ref 1.005–1.030)
Urobilinogen, UA: 0.2 mg/dL (ref 0.0–1.0)

## 2013-04-05 LAB — RAPID URINE DRUG SCREEN, HOSP PERFORMED
Amphetamines: NOT DETECTED
Barbiturates: NOT DETECTED
Benzodiazepines: NOT DETECTED

## 2013-04-05 MED ORDER — ASPIRIN 81 MG PO TABS: 81.0000 mg | ORAL_TABLET | Freq: Every day | ORAL | Status: DC

## 2013-04-05 MED ORDER — ASPIRIN 81 MG PO CHEW: 81.0000 mg | CHEWABLE_TABLET | Freq: Every day | ORAL | Status: DC

## 2013-04-05 MED ORDER — METOPROLOL TARTRATE 12.5 MG HALF TABLET
12.5000 mg | ORAL_TABLET | Freq: Two times a day (BID) | ORAL | Status: DC
  Administered 2013-04-05 – 2013-04-08 (×6): 12.5 mg via ORAL
  Filled 2013-04-05 (×10): qty 1

## 2013-04-05 MED ORDER — SODIUM CHLORIDE 0.9 % IJ SOLN: 3.0000 mL | Freq: Two times a day (BID) | INTRAMUSCULAR | Status: DC

## 2013-04-05 MED ORDER — ENOXAPARIN SODIUM 40 MG/0.4ML ~~LOC~~ SOLN
40.0000 mg | SUBCUTANEOUS | Status: DC
  Administered 2013-04-05: 40 mg via SUBCUTANEOUS
  Filled 2013-04-05 (×2): qty 0.4

## 2013-04-05 MED ORDER — ATORVASTATIN CALCIUM 40 MG PO TABS
40.0000 mg | ORAL_TABLET | Freq: Every day | ORAL | Status: DC
  Administered 2013-04-06 – 2013-04-12 (×6): 40 mg via ORAL
  Filled 2013-04-05 (×9): qty 1

## 2013-04-05 MED ORDER — HYDROCHLOROTHIAZIDE 12.5 MG PO CAPS
12.5000 mg | ORAL_CAPSULE | Freq: Every day | ORAL | Status: DC
  Administered 2013-04-05 – 2013-04-08 (×3): 12.5 mg via ORAL
  Filled 2013-04-05 (×5): qty 1

## 2013-04-05 MED ORDER — ONE-DAILY MULTI VITAMINS PO TABS: 1.0000 | ORAL_TABLET | Freq: Every day | ORAL | Status: DC

## 2013-04-05 MED ORDER — SODIUM CHLORIDE 0.9 % IJ SOLN: 3.0000 mL | INTRAMUSCULAR | Status: DC | PRN

## 2013-04-05 MED ORDER — ONDANSETRON HCL 4 MG/2ML IJ SOLN: 4.0000 mg | Freq: Four times a day (QID) | INTRAMUSCULAR | Status: DC | PRN

## 2013-04-05 MED ORDER — ONDANSETRON HCL 4 MG PO TABS: 4.0000 mg | ORAL_TABLET | Freq: Four times a day (QID) | ORAL | Status: DC | PRN

## 2013-04-05 MED ORDER — CELECOXIB 200 MG PO CAPS
200.0000 mg | ORAL_CAPSULE | Freq: Every day | ORAL | Status: DC
  Administered 2013-04-05: 200 mg via ORAL
  Filled 2013-04-05 (×2): qty 1

## 2013-04-05 MED ORDER — SODIUM CHLORIDE 0.9 % IJ SOLN
3.0000 mL | Freq: Two times a day (BID) | INTRAMUSCULAR | Status: DC
  Administered 2013-04-05: 3 mL via INTRAVENOUS

## 2013-04-05 MED ORDER — ADULT MULTIVITAMIN W/MINERALS CH
1.0000 | ORAL_TABLET | Freq: Every day | ORAL | Status: DC
  Administered 2013-04-05 – 2013-04-08 (×3): 1 via ORAL
  Filled 2013-04-05 (×5): qty 1

## 2013-04-05 MED ORDER — SODIUM CHLORIDE 0.9 % IV SOLN: 250.0000 mL | INTRAVENOUS | Status: DC | PRN

## 2013-04-05 NOTE — ED Provider Notes (Signed)
I saw and evaluated the patient, reviewed the resident's note and I agree with the findings and plan.   .Face to face Exam:  General:  Awake HEENT:  Atraumatic Resp:  Normal effort Abd:  Nondistended Neuro:No focal weakness  Nelia Shi, MD 04/05/13 [phone]

## 2013-04-05 NOTE — H&P (Signed)
[facility] History and Physical  Matthew Sullivan ZOX:[id number] DOB: Apr 15, 1946 DOA: 04/05/2013  Referring physician: None PCP: Cassell Clement, MD  Specialists: None  Chief Complaint: Dizziness  HPI: Matthew Sullivan is a 67 y.o. male has a history of hypertension as well as hyperlipidemia that presents to the emergency department for dizziness. Patient states he was outside working in his yard doing some heavy lifting when he started to feel dizzy. At that point he decided to sit down his dizziness continued. Patient stated that his surroundings were not or not moving but he just felt lightheaded. Patient denies any chest pain shortness of breath or palpitations. He does state that this happened approximately one month ago when he was walking his dog and he had walked approximately 1-2 miles. Patient states he stays very active. Patient also stated that he did feel very hot today and did and started sweating profusely.  He also stated that he felt as if he was going to pass out however did not. I got roughly 1 month ago patient states he felt he was put on his left side when he caught himself.    Of note in the emergency department CT of the head was conducted showing no acute problems. Patient does state he had a questionable TIA in the past.  Review of Systems:  In addition to the HPI above,  No Fever-chills, No Headache, No changes with Vision or hearing, No problems swallowing food or Liquids, No Chest pain, Cough or Shortness of Breath, No Abdominal pain, No Nausea or Vommitting, Bowel movements are regular, No Blood in stool or Urine, No dysuria, No new skin rashes or bruises, No new joints pains-aches,  No new weakness, tingling, numbness in any extremity, No recent weight gain or loss, No polyuria, polydypsia or polyphagia, No significant Mental Stressors.  Past Medical History  Diagnosis Date  . Hypertension   . Hyperlipidemia   . History of tinnitus   . Cervical  osteoarthritis   . MI, old 57  . History of embolic stroke   . Stroke    Past Surgical History  Procedure Laterality Date  . Back surgery    . Appendectomy    . Nose surgery    . Nasal septum surgery     Social History:  reports that he quit smoking about 35 years ago. He does not have any smokeless tobacco history on file. He reports that he does not drink alcohol or use illicit drugs. Patient is married and lives with his wife.  No Known Allergies  Family History  Problem Relation Age of Onset  . Cancer Father     Prior to Admission medications   Medication Sig Start Date End Date Taking? Authorizing Provider  aspirin EC 81 MG tablet Take 81 mg by mouth every evening.   Yes Historical Provider, MD  atorvastatin (LIPITOR) 80 MG tablet Take 40 mg by mouth every evening.   Yes Historical Provider, MD  celecoxib (CELEBREX) 200 MG capsule Take 200 mg by mouth daily as needed for pain.   Yes Historical Provider, MD  hydrochlorothiazide (MICROZIDE) 12.5 MG capsule Take 12.5 mg by mouth every evening.   Yes Historical Provider, MD   Physical Exam: Filed Vitals:   04/05/13 1758  BP:   Pulse:   Temp: 98 F (36.7 C)  Resp:      General: Well developed, well nourished, NAD, appears stated age  HEENT: NCAT, PERRLA, EOMI, Anicteic Sclera, mucous membranes moist. No pharyngeal erythema  or exudates  Neck: Supple, no JVD, no masses,  Cardiovascular: S1 S2 auscultated, no rubs, murmurs or gallops.  No ventricular heave. Regular rate and rhythm.  Respiratory: Clear to auscultation bilaterally with equal chest rise  Abdomen: Soft, non-tender to palpation, nondistended + bowel sounds  Extremities: warm dry without cyanosis clubbing or edema  Neuro: AAOx3, cranial nerves grossly intact. Strength 5/5 in patient's upper and lower extremities bilaterally  Skin: Without rashes exudates or nodules  Psych: Normal affect and demeanor with intact judgement and insight  Labs on  Admission:  Basic Metabolic Panel:  Recent Labs Lab 04/05/13 1610  NA 139  K 3.5  CL 101  CO2 28  GLUCOSE 113*  BUN 18  CREATININE 1.04  CALCIUM 9.1   Liver Function Tests: No results found for this basename: AST, ALT, ALKPHOS, BILITOT, PROT, ALBUMIN,  in the last 168 hours No results found for this basename: LIPASE, AMYLASE,  in the last 168 hours No results found for this basename: AMMONIA,  in the last 168 hours CBC:  Recent Labs Lab 04/05/13 1610  WBC 9.7  HGB 13.4  HCT 38.3*  MCV 88.5  PLT 170   Cardiac Enzymes: No results found for this basename: CKTOTAL, CKMB, CKMBINDEX, TROPONINI,  in the last 168 hours  BNP (last 3 results) No results found for this basename: PROBNP,  in the last 8760 hours CBG: No results found for this basename: GLUCAP,  in the last 168 hours  Radiological Exams on Admission: Dg Chest 2 View  04/05/2013   CLINICAL DATA:  Dizziness and syncope. History of hypertension.  EXAM: CHEST  2 VIEW  COMPARISON:  06/22/2011  FINDINGS: The heart size and mediastinal contours are within normal limits. Both lungs are clear. There has been a previous anterior cervical spine fusion. The bony thorax is intact.  IMPRESSION: No active cardiopulmonary disease.   Electronically Signed   By: Amie Portland   On: 04/05/2013 17:10   Ct Head Wo Contrast  04/05/2013   CLINICAL DATA:  At the sudden dizziness.  EXAM: CT HEAD WITHOUT CONTRAST  TECHNIQUE: Contiguous axial images were obtained from the base of the skull through the vertex without intravenous contrast.  COMPARISON:  None.  FINDINGS: Ventricles are normal in size and configuration.  There is a focus of well-defined fluid attenuation along the superior left cerebellum. This may reflect an old lacune infarct. It is nonspecific.  No other areas of abnormal parenchymal attenuation. No mass effect. No evidence of a recent infarct.  No extra-axial masses or abnormal fluid collections.  There is no intracranial  hemorrhage.  The visualized sinuses, mastoid air cells and middle ear cavities are clear.  IMPRESSION: Oval, well-defined fluid attenuation lesion of the superior left cerebellum. This could reflect an old lacune infarct or a dilated perivascular space. It is nonspecific.  There are no other abnormalities.   Electronically Signed   By: Amie Portland   On: 04/05/2013 16:39    EKG: Independently reviewed. Sinus bradycardia, rate 57, normal axis  Assessment/Plan 1. Syncope/ Near syncope  Will the patient to telemetry unit for observation. Will monitor for any tachycardia or bradycardia arrhythmias. Will obtain orthostatic blood pressures and neuro checks every 6 hours.  CT of the head was negative. EKG does show sinus bradycardia. This is likely vasovagal in nature. This patient did state he got profusely hot and started to sweat today. Will place holding parameters on his blood pressure medications.  May consider neuro or cardiology  consult if needed.  2.  Pure hypercholesterolemia  Will continue patient on his statin.  3.  HTN (hypertension)  We'll continue patient on his HCTZ with holding parameters.   Code Status: Full  Condition: Stable  Family Communication: wife at bedside  Disposition Plan: Admitted for observation  Time spent: 30 minutes  Catha Gosselin [facility] Pager [phone]  If 7PM-7AM, please contact night-coverage [email or web address] Password [facility] 04/05/2013, 6:35 PM

## 2013-04-05 NOTE — ED Notes (Signed)
MD at bedside. 

## 2013-04-05 NOTE — ED Notes (Signed)
Family at bedside. 

## 2013-04-05 NOTE — ED Provider Notes (Signed)
CSN: [id number]     Arrival date & time 04/05/13  1508 History   First MD Initiated Contact with Patient 04/05/13 1517     Chief Complaint  Patient presents with  . Near Syncope   (Consider location/radiation/quality/duration/timing/severity/associated sxs/prior Treatment) Patient is a 67 y.o. male presenting with general illness.  Illness Location:  Generalized Quality:  Dizziness Severity:  Moderate Onset quality:  Sudden Duration:  1 hour Timing:  Constant Progression:  Partially resolved Chronicity:  New Context:  While doing yardwork Relieved by:  Partially by rest Worsened by:  Nothing Associated symptoms: chest pain (pressure)   Associated symptoms: no abdominal pain, no congestion, no cough, no diarrhea, no fever, no headaches, no nausea, no rash, no rhinorrhea, no shortness of breath, no sore throat and no vomiting     Past Medical History  Diagnosis Date  . Hypertension   . Hyperlipidemia   . History of tinnitus   . Cervical osteoarthritis   . MI, old 44  . History of embolic stroke   . Stroke    Past Surgical History  Procedure Laterality Date  . Back surgery    . Appendectomy    . Nose surgery    . Nasal septum surgery     Family History  Problem Relation Age of Onset  . Cancer Father    History  Substance Use Topics  . Smoking status: Former Smoker    Quit date: 07/10/1977  . Smokeless tobacco: Not on file  . Alcohol Use: No    Review of Systems  Constitutional: Negative for fever and chills.  HENT: Negative for congestion, sore throat and rhinorrhea.   Eyes: Negative for photophobia and visual disturbance.  Respiratory: Negative for cough and shortness of breath.   Cardiovascular: Positive for chest pain (pressure). Negative for leg swelling.  Gastrointestinal: Negative for nausea, vomiting, abdominal pain, diarrhea and constipation.  Endocrine: Negative for polydipsia and polyuria.  Genitourinary: Negative for dysuria and hematuria.   Musculoskeletal: Negative for back pain and arthralgias.  Skin: Negative for color change and rash.  Neurological: Negative for dizziness, syncope, light-headedness and headaches.  Hematological: Negative for adenopathy. Does not bruise/bleed easily.  All other systems reviewed and are negative.    Allergies  Review of patient's allergies indicates no known allergies.  Home Medications   Current Outpatient Rx  Name  Route  Sig  Dispense  Refill  . aspirin EC 81 MG tablet   Oral   Take 81 mg by mouth every evening.         Marland Kitchen atorvastatin (LIPITOR) 80 MG tablet   Oral   Take 40 mg by mouth every evening.         . celecoxib (CELEBREX) 200 MG capsule   Oral   Take 200 mg by mouth daily as needed for pain.         . hydrochlorothiazide (MICROZIDE) 12.5 MG capsule   Oral   Take 12.5 mg by mouth every evening.          BP 130/75  Pulse 58  Temp(Src) 98 F (36.7 C) (Oral)  Resp 16  Ht 6' (1.829 m)  Wt 212 lb (96.163 kg)  BMI 28.75 kg/m2  SpO2 99% Physical Exam  Vitals reviewed. Constitutional: He is oriented to person, place, and time. He appears well-developed and well-nourished.  HENT:  Head: Normocephalic and atraumatic.  Eyes: Conjunctivae and EOM are normal.  Neck: Normal range of motion. Neck supple.  Cardiovascular: Normal rate, regular rhythm  and normal heart sounds.   Pulmonary/Chest: Effort normal and breath sounds normal. No respiratory distress.  Abdominal: He exhibits no distension. There is no tenderness. There is no rebound and no guarding.  Musculoskeletal: Normal range of motion.  Neurological: He is alert and oriented to person, place, and time. He has normal strength. No cranial nerve deficit or sensory deficit. Gait normal. GCS eye subscore is 4. GCS verbal subscore is 5. GCS motor subscore is 6.  Reflex Scores:      Tricep reflexes are 2+ on the right side and 2+ on the left side.      Bicep reflexes are 2+ on the right side and 2+ on the  left side.      Brachioradialis reflexes are 2+ on the right side and 2+ on the left side.      Patellar reflexes are 1+ on the right side and 3+ on the left side.      Achilles reflexes are 2+ on the right side and 2+ on the left side. Skin: Skin is warm and dry.    ED Course  Procedures (including critical care time) Labs Review Labs Reviewed  CBC - Abnormal; Notable for the following:    HCT 38.3 (*)    All other components within normal limits  BASIC METABOLIC PANEL - Abnormal; Notable for the following:    Glucose, Bld 113 (*)    GFR calc non Af Amer 72 (*)    GFR calc Af Amer 83 (*)    All other components within normal limits  URINE RAPID DRUG SCREEN (HOSP PERFORMED)  URINALYSIS, ROUTINE W REFLEX MICROSCOPIC  POCT I-STAT TROPONIN I   Imaging Review Dg Chest 2 View  04/05/2013   CLINICAL DATA:  Dizziness and syncope. History of hypertension.  EXAM: CHEST  2 VIEW  COMPARISON:  06/22/2011  FINDINGS: The heart size and mediastinal contours are within normal limits. Both lungs are clear. There has been a previous anterior cervical spine fusion. The bony thorax is intact.  IMPRESSION: No active cardiopulmonary disease.   Electronically Signed   By: Amie Portland   On: 04/05/2013 17:10   Ct Head Wo Contrast  04/05/2013   CLINICAL DATA:  At the sudden dizziness.  EXAM: CT HEAD WITHOUT CONTRAST  TECHNIQUE: Contiguous axial images were obtained from the base of the skull through the vertex without intravenous contrast.  COMPARISON:  None.  FINDINGS: Ventricles are normal in size and configuration.  There is a focus of well-defined fluid attenuation along the superior left cerebellum. This may reflect an old lacune infarct. It is nonspecific.  No other areas of abnormal parenchymal attenuation. No mass effect. No evidence of a recent infarct.  No extra-axial masses or abnormal fluid collections.  There is no intracranial hemorrhage.  The visualized sinuses, mastoid air cells and middle ear  cavities are clear.  IMPRESSION: Oval, well-defined fluid attenuation lesion of the superior left cerebellum. This could reflect an old lacune infarct or a dilated perivascular space. It is nonspecific.  There are no other abnormalities.   Electronically Signed   By: Amie Portland   On: 04/05/2013 16:39      Date: 04/05/2013  Rate: 57  Rhythm: sinus bradycardia  QRS Axis: normal  Intervals: normal  ST/T Wave abnormalities: normal  Conduction Disutrbances:none  Narrative Interpretation:   Old EKG Reviewed: unchanged   MDM   1. HTN (hypertension)   2. Benign hypertensive heart disease without heart failure   3. Pure hypercholesterolemia  4. Syncope   5. Near syncope   6. Chest pressure    67 y.o. male  with pertinent PMH of TIA, CAD, HTN presents with near syncopal episode while working in yard shortly prior to arrival.  Pt has ho prior TIA, states today he had onset of "dizziness" which he cannot describe as lightheadedness or vertigo.  He also complained of chest pressure, without frank pain.   He denies exertional component, states symptoms persisted when sitting.  ECG as above with sinus brady, unchanged from previous, and physical exam with decreased R patellar reflex, otherwise benign.  Labs and imaging as above without acute pathology to explain symptoms.  Consulted hospitalist and pt admitted for tele and further wu.     Labs and imaging as above reviewed by myself and attending,Dr. Radford Pax, with whom case was discussed.   1. HTN (hypertension)   2. Benign hypertensive heart disease without heart failure   3. Pure hypercholesterolemia   4. Syncope   5. Near syncope   6. Chest pressure         Noel Gerold, MD 04/05/13 1819

## 2013-04-05 NOTE — ED Notes (Signed)
Pt out of room in x-ray  

## 2013-04-05 NOTE — ED Notes (Signed)
Arrived EMS.  Pt awake and oriented.  Per EMS pt working in yard and got dizzy, felt like he was going to pass out.  Now pt denies chest pain, dizziness.  States "feel fatigued".

## 2013-04-06 ENCOUNTER — Encounter (HOSPITAL_COMMUNITY): Payer: Self-pay | Admitting: *Deleted

## 2013-04-06 DIAGNOSIS — R0789 Other chest pain: Secondary | ICD-10-CM

## 2013-04-06 DIAGNOSIS — I10 Essential (primary) hypertension: Secondary | ICD-10-CM

## 2013-04-06 DIAGNOSIS — R55 Syncope and collapse: Secondary | ICD-10-CM

## 2013-04-06 DIAGNOSIS — I214 Non-ST elevation (NSTEMI) myocardial infarction: Secondary | ICD-10-CM

## 2013-04-06 DIAGNOSIS — E7800 Pure hypercholesterolemia, unspecified: Secondary | ICD-10-CM

## 2013-04-06 LAB — TROPONIN I
Troponin I: 5.91 ng/mL (ref <0.30)
Troponin I: 6.38 ng/mL (ref <0.30)
Troponin I: 3.21 ng/mL (ref <0.30)
Troponin I: 2.24 ng/mL (ref <0.30)

## 2013-04-06 LAB — CBC
HCT: 37.9 % — ABNORMAL LOW (ref 39.0–52.0)
HCT: 37.9 % — ABNORMAL LOW (ref 39.0–52.0)
Hemoglobin: 13.3 g/dL (ref 13.0–17.0)
MCH: 31.3 pg (ref 26.0–34.0)
MCHC: 35.1 g/dL (ref 30.0–36.0)
MCV: 88.6 fL (ref 78.0–100.0)
Platelets: 148 10*3/uL — ABNORMAL LOW (ref 150–400)
RBC: 4.26 MIL/uL (ref 4.22–5.81)
RDW: 13.6 % (ref 11.5–15.5)
WBC: 7 10*3/uL (ref 4.0–10.5)

## 2013-04-06 LAB — BASIC METABOLIC PANEL
BUN: 16 mg/dL (ref 6–23)
BUN: 15 mg/dL (ref 6–23)
CO2: 27 mEq/L (ref 19–32)
CO2: 27 mEq/L (ref 19–32)
Chloride: 103 mEq/L (ref 96–112)
Chloride: 102 mEq/L (ref 96–112)
Creatinine, Ser: 0.9 mg/dL (ref 0.50–1.35)
Creatinine, Ser: 0.9 mg/dL (ref 0.50–1.35)
GFR calc Af Amer: >90 mL/min (ref >90)
GFR calc non Af Amer: 85 mL/min — ABNORMAL LOW (ref >90)
Glucose, Bld: 110 mg/dL — ABNORMAL HIGH (ref 70–99)
Glucose, Bld: 117 mg/dL — ABNORMAL HIGH (ref 70–99)
Potassium: 3.4 mEq/L — ABNORMAL LOW (ref 3.5–5.1)
Sodium: 139 mEq/L (ref 135–145)

## 2013-04-06 LAB — CK TOTAL AND CKMB (NOT AT ARMC)
CK, MB: 29.1 ng/mL (ref 0.3–4.0)
CK, MB: 22.7 ng/mL (ref 0.3–4.0)
CK, MB: 14.2 ng/mL (ref 0.3–4.0)
Relative Index: 4.9 — ABNORMAL HIGH (ref 0.0–2.5)
Total CK: 436 U/L — ABNORMAL HIGH (ref 7–232)

## 2013-04-06 LAB — HEPARIN LEVEL (UNFRACTIONATED)
Heparin Unfractionated: 0.22 IU/mL — ABNORMAL LOW (ref 0.30–0.70)
Heparin Unfractionated: 0.23 IU/mL — ABNORMAL LOW (ref 0.30–0.70)

## 2013-04-06 MED ORDER — SODIUM CHLORIDE 0.9 % IV SOLN: 1.0000 mL/kg/h | INTRAVENOUS | Status: DC

## 2013-04-06 MED ORDER — SODIUM CHLORIDE 0.9 % IJ SOLN: 3.0000 mL | INTRAMUSCULAR | Status: DC | PRN

## 2013-04-06 MED ORDER — ASPIRIN EC 325 MG PO TBEC
325.0000 mg | DELAYED_RELEASE_TABLET | Freq: Every day | ORAL | Status: DC
  Administered 2013-04-06 – 2013-04-08 (×2): 325 mg via ORAL
  Filled 2013-04-06 (×2): qty 1

## 2013-04-06 MED ORDER — HEPARIN BOLUS VIA INFUSION
4000.0000 [IU] | Freq: Once | INTRAVENOUS | Status: AC
  Administered 2013-04-06: 4000 [IU] via INTRAVENOUS
  Filled 2013-04-06: qty 4000

## 2013-04-06 MED ORDER — POTASSIUM CHLORIDE CRYS ER 20 MEQ PO TBCR
40.0000 meq | EXTENDED_RELEASE_TABLET | Freq: Once | ORAL | Status: AC
  Administered 2013-04-06: 40 meq via ORAL
  Filled 2013-04-06: qty 2

## 2013-04-06 MED ORDER — SODIUM CHLORIDE 0.9 % IV SOLN: 250.0000 mL | INTRAVENOUS | Status: DC | PRN

## 2013-04-06 MED ORDER — SODIUM CHLORIDE 0.9 % IJ SOLN: 3.0000 mL | Freq: Two times a day (BID) | INTRAMUSCULAR | Status: DC

## 2013-04-06 MED ORDER — NITROGLYCERIN 0.4 MG SL SUBL
0.4000 mg | SUBLINGUAL_TABLET | SUBLINGUAL | Status: DC | PRN
Start: 2013-04-06 — End: 2013-04-09

## 2013-04-06 MED ORDER — HEPARIN (PORCINE) IN NACL 100-0.45 UNIT/ML-% IJ SOLN
1600.0000 [IU]/h | INTRAMUSCULAR | Status: AC
  Administered 2013-04-06: 950 [IU]/h via INTRAVENOUS
  Filled 2013-04-06 (×3): qty 250

## 2013-04-06 MED ORDER — POTASSIUM CHLORIDE 20 MEQ/15ML (10%) PO LIQD
40.0000 meq | Freq: Once | ORAL | Status: DC
  Filled 2013-04-06: qty 30

## 2013-04-06 NOTE — Progress Notes (Signed)
CRITICAL VALUE ALERT  Critical value received:  Troponin 1.9  Date of notification:  04/05/13  Time of notification:  2030  Critical value read back:yes  Nurse who received alert:  Merry Proud, RN  MD notified (1st page):  Elray Mcgregor, NP  Time of first page:  2040  MD notified (2nd page):  Time of second page:  Responding MD:  Elray Mcgregor, NP  Time MD responded:  2042

## 2013-04-06 NOTE — Progress Notes (Signed)
Patient ID: Matthew Sullivan  male  ZOX:[id number]    DOB: 12/25/1945    DOA: 04/05/2013  PCP: Cassell Clement, MD  Assessment/Plan: Principal Problem:   NSTEMI : Patient is not having any chest pain or shortness of breath, he did endorse having intermittent dizziness for last one month. Had mowed lawn, lifting, cutting trees yesterday. Troponin 1.9 at the time of admission. - Troponins trended up to 8.0 this a.m (0040), stat cardiac enzymes showed CK-MB 29.1 CK 436, troponin 6.3. - per sign-out, hospitalist PA, Ms Burnadette Peter had notified on-call cardiology fellow, Dr Katha Cabal.  - Placed on heparin drip this morning, continue aspirin, Lipitor, beta blocker - Discussed in detail with Dr. Donnie Aho for cardiology consultation. Will keep patient n.p.o. for possible cath  Active Problems:   Pure hypercholesterolemia: - Continue statin    HTN (hypertension): - Currently stable, continue beta blocker, HCTZ    Syncope: Likely due to #1 - Cardiology consultation called, await recommendations  DVT Prophylaxis:  Code Status:  Disposition:    Subjective: Denies any chest pain, no shortness of breath, states helping intermittent dizziness with exertion for last one month  Objective: Weight change:   Intake/Output Summary (Last 24 hours) at 04/06/13 1005 Last data filed at 04/06/13 0853  Gross per 24 hour  Intake      0 ml  Output      0 ml  Net      0 ml   Blood pressure 120/74, pulse 59, temperature 97.6 F (36.4 C), temperature source Oral, resp. rate 18, height 6' (1.829 m), weight 96.163 kg (212 lb), SpO2 96.00%.  Physical Exam: General: Alert and awake, oriented x3, not in any acute distress. CVS: S1-S2 clear, no murmur rubs or gallops Chest: clear to auscultation bilaterally, no wheezing, rales or rhonchi Abdomen: soft nontender, nondistended, normal bowel sounds  Extremities: no cyanosis, clubbing or edema noted bilaterally Neuro: Cranial nerves II-XII intact, no focal  neurological deficits  Lab Results: Basic Metabolic Panel:  Recent Labs Lab 04/06/13 0700 04/06/13 0805  NA 139 139  K 3.4* 3.5  CL 103 102  CO2 27 27  GLUCOSE 110* 117*  BUN 16 15  CREATININE 0.90 0.90  CALCIUM 8.9 9.0   Liver Function Tests: No results found for this basename: AST, ALT, ALKPHOS, BILITOT, PROT, ALBUMIN,  in the last 168 hours No results found for this basename: LIPASE, AMYLASE,  in the last 168 hours No results found for this basename: AMMONIA,  in the last 168 hours CBC:  Recent Labs Lab 04/06/13 0700 04/06/13 0805  WBC 7.2 7.0  HGB 13.3 13.4  HCT 37.9* 37.9*  MCV 89.0 88.6  PLT 156 148*   Cardiac Enzymes:  Recent Labs Lab 04/06/13 0040 04/06/13 0700 04/06/13 0805  CKTOTAL  --   --  436*  CKMB  --   --  29.1*  TROPONINI 8.00* 5.91* 6.38*   BNP: No components found with this basename: POCBNP,  CBG: No results found for this basename: GLUCAP,  in the last 168 hours   Micro Results: No results found for this or any previous visit (from the past 240 hour(s)).  Studies/Results: Dg Chest 2 View  04/05/2013   CLINICAL DATA:  Dizziness and syncope. History of hypertension.  EXAM: CHEST  2 VIEW  COMPARISON:  06/22/2011  FINDINGS: The heart size and mediastinal contours are within normal limits. Both lungs are clear. There has been a previous anterior cervical spine fusion. The bony thorax is intact.  IMPRESSION: No active cardiopulmonary disease.   Electronically Signed   By: Amie Portland   On: 04/05/2013 17:10   Ct Head Wo Contrast  04/05/2013   CLINICAL DATA:  At the sudden dizziness.  EXAM: CT HEAD WITHOUT CONTRAST  TECHNIQUE: Contiguous axial images were obtained from the base of the skull through the vertex without intravenous contrast.  COMPARISON:  None.  FINDINGS: Ventricles are normal in size and configuration.  There is a focus of well-defined fluid attenuation along the superior left cerebellum. This may reflect an old lacune infarct.  It is nonspecific.  No other areas of abnormal parenchymal attenuation. No mass effect. No evidence of a recent infarct.  No extra-axial masses or abnormal fluid collections.  There is no intracranial hemorrhage.  The visualized sinuses, mastoid air cells and middle ear cavities are clear.  IMPRESSION: Oval, well-defined fluid attenuation lesion of the superior left cerebellum. This could reflect an old lacune infarct or a dilated perivascular space. It is nonspecific.  There are no other abnormalities.   Electronically Signed   By: Amie Portland   On: 04/05/2013 16:39    Medications: Scheduled Meds: . aspirin EC  325 mg Oral Daily  . atorvastatin  40 mg Oral q1800  . hydrochlorothiazide  12.5 mg Oral Daily  . metoprolol tartrate  12.5 mg Oral BID  . multivitamin with minerals  1 tablet Oral Daily  . sodium chloride  3 mL Intravenous Q12H  . sodium chloride  3 mL Intravenous Q12H      LOS: 1 day   RAI,RIPUDEEP M.D. [facility] 04/06/2013, 10:05 AM Pager: [phone]  If 7PM-7AM, please contact night-coverage [email or web address] Password [email or web address]

## 2013-04-06 NOTE — Progress Notes (Signed)
ANTICOAGULATION CONSULT NOTE - Initial Consult  Pharmacy Consult for Heparin Indication: chest pain/ACS  No Known Allergies  Patient Measurements: Height: 6' (182.9 cm) Weight: 212 lb (96.163 kg) IBW/kg (Calculated) : 77.6 Heparin Dosing Weight:   Vital Signs: Temp: 98.1 F (36.7 C) (09/28 0500) BP: 120/88 mmHg (09/28 0506) Pulse Rate: 58 (09/28 0506)  Labs:  Recent Labs  04/05/13 1610 04/05/13 1903 04/06/13 0040 04/06/13 0700 04/06/13 0805  HGB 13.4  --   --  13.3 13.4  HCT 38.3*  --   --  37.9* 37.9*  PLT 170  --   --  156 148*  CREATININE 1.04  --   --  0.90  --   TROPONINI  --  1.90* 8.00* 5.91*  --     Estimated Creatinine Clearance: 94.4 ml/min (by C-G formula based on Cr of 0.9).   Medical History: Past Medical History  Diagnosis Date  . Hypertension   . Hyperlipidemia   . History of tinnitus   . Cervical osteoarthritis   . MI, old 67  . History of embolic stroke   . Stroke   . Melanoma     Medications:  Scheduled:  . aspirin EC  325 mg Oral Daily  . atorvastatin  40 mg Oral q1800  . celecoxib  200 mg Oral Daily  . heparin  4,000 Units Intravenous Once  . hydrochlorothiazide  12.5 mg Oral Daily  . metoprolol tartrate  12.5 mg Oral BID  . multivitamin with minerals  1 tablet Oral Daily  . sodium chloride  3 mL Intravenous Q12H  . sodium chloride  3 mL Intravenous Q12H    Assessment: 67yo male presenting with syncope/near syncope, to start Heparin for R/O MI.  Heparin has been initiated at 950 units/hr, will adjust dosing for weight.  Hg and pltc are wnl; there are no baseline coags.  No history of bleeding problems per d/w patient.    Goal of Therapy:  Heparin level 0.3-0.7 units/ml Monitor platelets by anticoagulation protocol: Yes   Plan:  1.  Heparin 4000 units IV x 1 and increase to 1100 units/hr 2.  Heparin level 6 hours 3.  Daily HL, CBC  Marisue Humble, PharmD Clinical Pharmacist [facility]

## 2013-04-06 NOTE — Progress Notes (Signed)
ANTICOAGULATION CONSULT NOTE - Initial Consult  Pharmacy Consult for Heparin Indication: chest pain/ACS  No Known Allergies  Patient Measurements: Height: 6' (182.9 cm) Weight: 212 lb (96.163 kg) IBW/kg (Calculated) : 77.6 Heparin Dosing Weight: 96 kg  Vital Signs: Temp: 98 F (36.7 C) (09/28 1629) Temp src: Oral (09/28 1629) BP: 121/76 mmHg (09/28 1629) Pulse Rate: 58 (09/28 1629)  Labs:  Recent Labs  04/05/13 1610  04/06/13 0700 04/06/13 0805 04/06/13 1310 04/06/13 1437  HGB 13.4  --  13.3 13.4  --   --   HCT 38.3*  --  37.9* 37.9*  --   --   PLT 170  --  156 148*  --   --   LABPROT  --   --   --  12.8  --   --   INR  --   --   --  0.98  --   --   HEPARINUNFRC  --   --   --   --   --  0.22*  CREATININE 1.04  --  0.90 0.90  --   --   CKTOTAL  --   --   --  436* 359*  --   CKMB  --   --   --  29.1* 22.7*  --   TROPONINI  --   < > 5.91* 6.38* 3.21*  --   < > = values in this interval not displayed.  Estimated Creatinine Clearance: 94.4 ml/min (by C-G formula based on Cr of 0.9).   Medical History: Past Medical History  Diagnosis Date  . Hypertension   . Hyperlipidemia   . History of tinnitus   . Cervical osteoarthritis   . MI, old 60  . History of embolic stroke   . Stroke   . Melanoma     Medications:  Scheduled:  . aspirin EC  325 mg Oral Daily  . atorvastatin  40 mg Oral q1800  . hydrochlorothiazide  12.5 mg Oral Daily  . metoprolol tartrate  12.5 mg Oral BID  . multivitamin with minerals  1 tablet Oral Daily  . sodium chloride  3 mL Intravenous Q12H  . sodium chloride  3 mL Intravenous Q12H    Assessment: 67 yo male presenting with syncope/near syncope, on Heparin for NSTEMI.  Heparin level is sub therapeutic on 1100 units/hr.  No IV issues or bleeding noted per RN.  Noted plans for cardiac cath tomorrow.  Goal of Therapy:  Heparin level 0.3-0.7 units/ml Monitor platelets by anticoagulation protocol: Yes   Plan:  1.  Increase heparin  to 1350 units/hr.   2.  Heparin level 6 hours 3.  Daily HL, CBC  Cheyanna Strick, Pharm.D., BCPS Clinical Pharmacist Pager [phone] 04/06/2013 5:51 PM

## 2013-04-06 NOTE — Progress Notes (Signed)
TC from lab: Critical lab value CKMB 29.1 . Text to Dr. Isidoro Donning- TC from MD acknowledged.Will await cards

## 2013-04-06 NOTE — Consult Note (Addendum)
CARDIOLOGY CONSULT NOTE  Patient ID: ZYAN COBY MRN: [id number], DOB/AGE: 12-04-1945   Admit date: 04/05/2013 Date of Consult: 04/06/2013   Primary Physician: Cassell Clement, MD Primary Cardiologist: Cassell Clement, MD  Pt. Profile  Dizziness,Syncope, Elevated cardiac enzymes  Problem List  Past Medical History  Diagnosis Date  . Hypertension   . Hyperlipidemia   . History of tinnitus   . Cervical osteoarthritis   . MI, old 78  . History of embolic stroke   . Stroke   . Melanoma     Past Surgical History  Procedure Laterality Date  . Back surgery    . Appendectomy    . Nose surgery    . Nasal septum surgery       Allergies  No Known Allergies  HPI   67 year old male who is followed by Dr [name] in his clinic for hypertension and hyperlipidemia, last seen on 03/21/2013. He presented to the ER yesterday for intermittent dizziness. He states that for the last month he gets dizzy while walking his dog. Yesterday, while working in his yard, he experienced dizziness even after sitting down and resting. Patient denies shortness of breath or palpitations but admits to diaphoresis yesterday. He also stated that he felt as if he was going to pass out however did not. Right now he feels mild chest heaviness.  He has no prior CAD history. He quit smoking in 1979 and has significant family h/o CAD - father had MI at 60, uncle at 15.   Inpatient Medications  . aspirin EC  325 mg Oral Daily  . atorvastatin  40 mg Oral q1800  . hydrochlorothiazide  12.5 mg Oral Daily  . metoprolol tartrate  12.5 mg Oral BID  . multivitamin with minerals  1 tablet Oral Daily  . sodium chloride  3 mL Intravenous Q12H  . sodium chloride  3 mL Intravenous Q12H    Family History Family History  Problem Relation Age of Onset  . Cancer Father      Social History History   Social History  . Marital Status: Married    Spouse Name: N/A    Number of Children: N/A  . Years of  Education: N/A   Occupational History  . Not on file.   Social History Main Topics  . Smoking status: Former Smoker    Quit date: 07/10/1977  . Smokeless tobacco: Not on file  . Alcohol Use: No  . Drug Use: No  . Sexual Activity: Not on file   Other Topics Concern  . Not on file   Social History Narrative  . No narrative on file     Review of Systems  General:  No chills, fever, night sweats or weight changes.  Cardiovascular:  No chest pain, dyspnea on exertion, edema, orthopnea, palpitations, paroxysmal nocturnal dyspnea. Dermatological: No rash, lesions/masses Respiratory: No cough, dyspnea Urologic: No hematuria, dysuria Abdominal:   No nausea, vomiting, diarrhea, bright red blood per rectum, melena, or hematemesis Neurologic:  No visual changes, wkns, changes in mental status. All other systems reviewed and are otherwise negative except as noted above.  Physical Exam  Blood pressure 116/71, pulse 59, temperature 97.6 F (36.4 C), temperature source Oral, resp. rate 18, height 6' (1.829 m), weight 212 lb (96.163 kg), SpO2 96.00%.  General: Pleasant, NAD Psych: Normal affect. Neuro: Alert and oriented X 3. Moves all extremities spontaneously. HEENT: Normal  Neck: Supple without bruits or JVD. Lungs:  Resp regular and unlabored, CTA. Heart:  RRR no s3, s4, or murmurs. Abdomen: Soft, non-tender, non-distended, BS + x 4.  Extremities: No clubbing, cyanosis or edema. DP/PT/Radials 2+ and equal bilaterally.  Labs  Recent Labs  04/05/13 1903 04/06/13 0040 04/06/13 0700 04/06/13 0805  CKTOTAL  --   --   --  436*  CKMB  --   --   --  29.1*  TROPONINI 1.90* 8.00* 5.91* 6.38*   Lab Results  Component Value Date   WBC 7.0 04/06/2013   HGB 13.4 04/06/2013   HCT 37.9* 04/06/2013   MCV 88.6 04/06/2013   PLT 148* 04/06/2013    Recent Labs Lab 04/06/13 0805  NA 139  K 3.5  CL 102  CO2 27  BUN 15  CREATININE 0.90  CALCIUM 9.0  GLUCOSE 117*   Lab Results    Component Value Date   CHOL 125 03/21/2013   HDL 42.50 03/21/2013   LDLCALC 70 03/21/2013   TRIG 65.0 03/21/2013   Radiology/Studies  Dg Chest 2 View  04/05/2013   CLINICAL DATA:  Dizziness and syncope. History of hypertension. IMPRESSION: No active cardiopulmonary disease.     Ct Head Wo Contrast  04/05/2013   CLINICAL DATA:  IMPRESSION: Oval, well-defined fluid attenuation lesion of the superior left cerebellum. This could reflect an old lacune infarct or a dilated perivascular space. It is nonspecific.  There are no other abnormalities.  ECG  SB 57 BPM, otherwise normal ECG, unchanged fro ECG on 03/21/2013   ASSESSMENT AND PLAN  67 year old male with HTN. HLP, no prior CAD history, but with significant FH of CAD  1. NSTEMI - Troponin peak 8, CK-MB 29, now downtrending, the patient is pain free - continue ASA 325 mg, Heparin drip, Metoprolol, atorvastatin - Cath tomorrow, normal GFR - Echo - we will order  2. Hypertension - well controlled on current regimen  3. Hyperlipdemia - well controlled on Lipitor 40 mg  4. Hypokalemia - replace KCl   Signed, Lars Masson, MD 04/06/2013, 10:42 AM

## 2013-04-06 NOTE — Progress Notes (Signed)
Utilization Review completed.  

## 2013-04-06 NOTE — Progress Notes (Signed)
ANTICOAGULATION CONSULT NOTE - Follow Up Consult  Pharmacy Consult for heparin Indication: NSTEMI  Labs:  Recent Labs  04/05/13 1610  04/06/13 0700 04/06/13 0805 04/06/13 1310 04/06/13 1437 04/06/13 1925 04/06/13 2319  HGB 13.4  --  13.3 13.4  --   --   --   --   HCT 38.3*  --  37.9* 37.9*  --   --   --   --   PLT 170  --  156 148*  --   --   --   --   LABPROT  --   --   --  12.8  --   --   --   --   INR  --   --   --  0.98  --   --   --   --   HEPARINUNFRC  --   --   --   --   --  0.22*  --  0.23*  CREATININE 1.04  --  0.90 0.90  --   --   --   --   CKTOTAL  --   --   --  436* 359*  --  287*  --   CKMB  --   --   --  29.1* 22.7*  --  14.2*  --   TROPONINI  --   < > 5.91* 6.38* 3.21*  --  2.24*  --   < > = values in this interval not displayed.   Assessment: 67yo male remains subtherapeutic on heparin with almost no movement in level after rate increase.  Goal of Therapy:  Heparin level 0.3-0.7 units/ml   Plan:  Will increase heparin gtt by ~2-3 units/kg/hr to 1600 units/hr and check level in 6hr.  Vernard Gambles, PharmD, BCPS  04/07/2013,12:05 AM

## 2013-04-06 NOTE — Progress Notes (Signed)
Troponin = 8.  Elray Mcgregor, NP notified.  No new orders received.  Patient reports no c/o chest pain.  Will continue to monitor.  Matthew Sullivan

## 2013-04-07 ENCOUNTER — Encounter (HOSPITAL_COMMUNITY)
Admission: EM | Disposition: A | Discharge status: Home / Self Care | Payer: Self-pay | Source: Home / Self Care | Attending: Thoracic Surgery (Cardiothoracic Vascular Surgery)

## 2013-04-07 ENCOUNTER — Other Ambulatory Visit: Payer: Self-pay

## 2013-04-07 DIAGNOSIS — D151 Benign neoplasm of heart: Secondary | ICD-10-CM | POA: Diagnosis present

## 2013-04-07 DIAGNOSIS — I214 Non-ST elevation (NSTEMI) myocardial infarction: Secondary | ICD-10-CM

## 2013-04-07 DIAGNOSIS — I517 Cardiomegaly: Secondary | ICD-10-CM

## 2013-04-07 HISTORY — PX: LEFT HEART CATHETERIZATION WITH CORONARY ANGIOGRAM: SHX5451

## 2013-04-07 LAB — HEPARIN LEVEL (UNFRACTIONATED): Heparin Unfractionated: 0.33 IU/mL (ref 0.30–0.70)

## 2013-04-07 SURGERY — LEFT HEART CATHETERIZATION WITH CORONARY ANGIOGRAM
Anesthesia: LOCAL

## 2013-04-07 MED ORDER — HEPARIN (PORCINE) IN NACL 100-0.45 UNIT/ML-% IJ SOLN
1700.0000 [IU]/h | INTRAMUSCULAR | Status: DC
  Administered 2013-04-07: 1600 [IU]/h via INTRAVENOUS
  Administered 2013-04-08 (×2): 1700 [IU]/h via INTRAVENOUS
  Filled 2013-04-07 (×4): qty 250

## 2013-04-07 MED ORDER — SODIUM CHLORIDE 0.9 % IJ SOLN: 3.0000 mL | Freq: Two times a day (BID) | INTRAMUSCULAR | Status: DC

## 2013-04-07 MED ORDER — LIDOCAINE HCL (PF) 1 % IJ SOLN
INTRAMUSCULAR | Status: AC
  Filled 2013-04-07: qty 30

## 2013-04-07 MED ORDER — HEPARIN SODIUM (PORCINE) 1000 UNIT/ML IJ SOLN
INTRAMUSCULAR | Status: AC
  Filled 2013-04-07: qty 1

## 2013-04-07 MED ORDER — SODIUM CHLORIDE 0.9 % IV SOLN: 1.0000 mL/kg/h | INTRAVENOUS | Status: AC

## 2013-04-07 MED ORDER — HEPARIN (PORCINE) IN NACL 2-0.9 UNIT/ML-% IJ SOLN
INTRAMUSCULAR | Status: AC
  Filled 2013-04-07: qty 1000

## 2013-04-07 MED ORDER — NITROGLYCERIN 0.2 MG/ML ON CALL CATH LAB
INTRAVENOUS | Status: AC
  Filled 2013-04-07: qty 1

## 2013-04-07 MED ORDER — MIDAZOLAM HCL 2 MG/2ML IJ SOLN
INTRAMUSCULAR | Status: AC
  Filled 2013-04-07: qty 2

## 2013-04-07 MED ORDER — SODIUM CHLORIDE 0.9 % IV SOLN: 250.0000 mL | INTRAVENOUS | Status: DC | PRN

## 2013-04-07 MED ORDER — FENTANYL CITRATE 0.05 MG/ML IJ SOLN
INTRAMUSCULAR | Status: AC
  Filled 2013-04-07: qty 2

## 2013-04-07 MED ORDER — ACETAMINOPHEN 325 MG PO TABS: 650.0000 mg | ORAL_TABLET | ORAL | Status: DC | PRN

## 2013-04-07 MED ORDER — SODIUM CHLORIDE 0.9 % IJ SOLN: 3.0000 mL | INTRAMUSCULAR | Status: DC | PRN

## 2013-04-07 MED ORDER — VERAPAMIL HCL 2.5 MG/ML IV SOLN
INTRAVENOUS | Status: AC
  Filled 2013-04-07: qty 2

## 2013-04-07 NOTE — Progress Notes (Signed)
Subjective:  Patient seen briefly this am as he was being transported to cath lab. Patient feels well. No chest pain overnight. No further dizziness or syncope.  Objective:  Vital Signs in the last 24 hours: Temp:  [97.6 F (36.4 C)-98.1 F (36.7 C)] 97.7 F (36.5 C) (09/29 0500) Pulse Rate:  [56-78] 78 (09/29 0506) Resp:  [16-18] 16 (09/29 0500) BP: (105-121)/(60-76) 105/65 mmHg (09/29 0506) SpO2:  [96 %-98 %] 96 % (09/29 0500)  Intake/Output from previous day: 09/28 0701 - 09/29 0700 In: 475.3 [P.O.:240; I.V.:235.3] Out: -  Intake/Output from this shift:    . aspirin EC  325 mg Oral Daily  . atorvastatin  40 mg Oral q1800  . hydrochlorothiazide  12.5 mg Oral Daily  . metoprolol tartrate  12.5 mg Oral BID  . multivitamin with minerals  1 tablet Oral Daily  . sodium chloride  3 mL Intravenous Q12H   . sodium chloride 1 mL/kg/hr (04/07/13 0444)    Physical Exam: The patient appears to be in no distress.  Head and neck exam reveals that the pupils are equal and reactive.  The extraocular movements are full.  There is no scleral icterus.  Mouth and pharynx are benign.  No lymphadenopathy.  No carotid bruits.  The jugular venous pressure is normal.  Thyroid is not enlarged or tender.  Chest is clear to percussion and auscultation.  No rales or rhonchi.  Expansion of the chest is symmetrical.  Heart reveals no abnormal lift or heave.  First and second heart sounds are normal.  There is no murmur gallop rub or click.  The abdomen is soft and nontender.  Bowel sounds are normoactive.  There is no hepatosplenomegaly or mass.  There are no abdominal bruits.  Extremities reveal no phlebitis or edema.  Pedal pulses are good.  There is no cyanosis or clubbing.  Neurologic exam is normal strength and no lateralizing weakness.  No sensory deficits.  Integument reveals no rash  Lab Results:  Recent Labs  04/06/13 0700 04/06/13 0805  WBC 7.2 7.0  HGB 13.3 13.4  PLT 156  148*    Recent Labs  04/06/13 0700 04/06/13 0805  NA 139 139  K 3.4* 3.5  CL 103 102  CO2 27 27  GLUCOSE 110* 117*  BUN 16 15  CREATININE 0.90 0.90    Recent Labs  04/06/13 1310 04/06/13 1925  TROPONINI 3.21* 2.24*   Hepatic Function Panel No results found for this basename: PROT, ALBUMIN, AST, ALT, ALKPHOS, BILITOT, BILIDIR, IBILI,  in the last 72 hours No results found for this basename: CHOL,  in the last 72 hours No results found for this basename: PROTIME,  in the last 72 hours  Imaging: Dg Chest 2 View  04/05/2013   CLINICAL DATA:  Dizziness and syncope. History of hypertension.  EXAM: CHEST  2 VIEW  COMPARISON:  06/22/2011  FINDINGS: The heart size and mediastinal contours are within normal limits. Both lungs are clear. There has been a previous anterior cervical spine fusion. The bony thorax is intact.  IMPRESSION: No active cardiopulmonary disease.   Electronically Signed   By: Amie Portland   On: 04/05/2013 17:10   Ct Head Wo Contrast  04/05/2013   CLINICAL DATA:  At the sudden dizziness.  EXAM: CT HEAD WITHOUT CONTRAST  TECHNIQUE: Contiguous axial images were obtained from the base of the skull through the vertex without intravenous contrast.  COMPARISON:  None.  FINDINGS: Ventricles are normal in size and  configuration.  There is a focus of well-defined fluid attenuation along the superior left cerebellum. This may reflect an old lacune infarct. It is nonspecific.  No other areas of abnormal parenchymal attenuation. No mass effect. No evidence of a recent infarct.  No extra-axial masses or abnormal fluid collections.  There is no intracranial hemorrhage.  The visualized sinuses, mastoid air cells and middle ear cavities are clear.  IMPRESSION: Oval, well-defined fluid attenuation lesion of the superior left cerebellum. This could reflect an old lacune infarct or a dilated perivascular space. It is nonspecific.  There are no other abnormalities.   Electronically Signed    By: Amie Portland   On: 04/05/2013 16:39    Cardiac Studies: Left heart cath this am pending. 2D echo pending. Assessment/Plan:  1. NSTEMI - Troponin peak 8, CK-MB 29, now downtrending, the patient is pain free  - continue ASA 325 mg,, Metoprolol, atorvastatin  - Cath today - Echo - pending. 2. Hypertension - well controlled on current regimen  3. Hyperlipdemia - well controlled on Lipitor 40 mg  4. Hypokalemia - resolved.  Plan: possibly home later today depending on results of cath and echo.   LOS: 2 days    Cassell Clement 04/07/2013, 7:34 AM

## 2013-04-07 NOTE — CV Procedure (Signed)
   Cardiac Catheterization Procedure Note  Name: Matthew Sullivan MRN: [id number] DOB: [birth year]  Procedure: Left Heart Cath, Selective Coronary Angiography, LV angiography  Indication: NSTEMI   Procedural Details: The right wrist was prepped, draped, and anesthetized with 1% lidocaine. Using the modified Seldinger technique, a 5 French sheath was introduced into the right radial artery. 3 mg of verapamil was administered through the sheath, weight-based unfractionated heparin was administered intravenously. Standard Judkins catheters were used for selective coronary angiography and left ventriculography. Catheter exchanges were performed over an exchange length guidewire. There were no immediate procedural complications. A TR band was used for radial hemostasis at the completion of the procedure.  The patient was transferred to the post catheterization recovery area for further monitoring.  Procedural Findings: Hemodynamics: AO 140/80 LV 140/17  Coronary angiography: Coronary dominance: right  Left mainstem: The left main stem is angiographically normal. There is no obstructive disease. The vessel arises from the left cusp it divides into the LAD and left circumflex.  Left anterior descending (LAD): The LAD reaches the left ventricular apex. There is a large first diagonal with no obstructive disease. There is no significant obstructive disease throughout the course of the LAD.  Left circumflex (LCx): The left circumflex is normal in caliber. The intermediate branch divides into twin vessels and has no obstructive disease. The AV circumflex is large and gives off an obtuse marginal branch without stenosis.  Right coronary artery (RCA): The RCA is a dominant vessel. There is no significant obstructive disease throughout the course of this vessel. The PDA and PLA branches are widely patent. At the very end of the PLA branch, there is a dilated vessel which may represent an arteriovenous  fistula or a small coronary aneurysm.  Left ventriculography: Left ventricular systolic function is normal, LVEF is estimated at 55-65%, there is no significant mitral regurgitation   Final Conclusions:   1. Widely patent coronary arteries without obstructive CAD 2. Normal left ventricular systolic function.  Recommendations: The patient has no apparent coronary artery disease. His history is concerning with near syncope and elevated cardiac enzymes. He did have some supraventricular tachycardia during the procedure and I think the differential for his non-ST infarct includes demand ischemia related to cardiac arrhythmia versus pulmonary embolus. Will check a VQ scan today to rule out pulmonary embolus.  Tonny Bollman 04/07/2013, 9:13 AM

## 2013-04-07 NOTE — Progress Notes (Signed)
ANTICOAGULATION CONSULT NOTE - Follow Up Consult  Pharmacy Consult for heparin Indication: r/o PE  Labs:  Recent Labs  04/05/13 1610  04/06/13 0700 04/06/13 0805 04/06/13 1310 04/06/13 1437 04/06/13 1925 04/06/13 2319 04/07/13 2240  HGB 13.4  --  13.3 13.4  --   --   --   --   --   HCT 38.3*  --  37.9* 37.9*  --   --   --   --   --   PLT 170  --  156 148*  --   --   --   --   --   LABPROT  --   --   --  12.8  --   --   --   --   --   INR  --   --   --  0.98  --   --   --   --   --   HEPARINUNFRC  --   --   --   --   --  0.22*  --  0.23* 0.33  CREATININE 1.04  --  0.90 0.90  --   --   --   --   --   CKTOTAL  --   --   --  436* 359*  --  287*  --   --   CKMB  --   --   --  29.1* 22.7*  --  14.2*  --   --   TROPONINI  --   < > 5.91* 6.38* 3.21*  --  2.24*  --   --   < > = values in this interval not displayed.   Assessment/Plan:  67yo male therapeutic on heparin after resumed post-cath, now awaiting VQ scan.  Will continue gtt at current rate and confirm stable with am labs.  Vernard Gambles, PharmD, BCPS  04/07/2013,11:54 PM

## 2013-04-07 NOTE — Progress Notes (Signed)
Patient ID: Matthew Sullivan  male  ZOX:[id number]    DOB: 1945-12-02    DOA: 04/05/2013  PCP: Cassell Clement, MD  Assessment/Plan: Principal Problem: Elevated troponins with Intermittent dizziness for last one month/ syncope. Had mowed lawn, lifting, cutting trees yesterday. Troponin 1.9 at the time of admission, then trended up to 8.0, . 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 Dr. [name] has called CT surgery consult per note. - V/Q scan canceled by cardiology  Active Problems:   Pure hypercholesterolemia: - Continue statin    HTN (hypertension): - Currently stable, continue beta blocker, HCTZ  DVT Prophylaxis: Currently on heparin drip  Code Status:  Disposition:    Subjective: Patient seen after cardiac catheterization, had no complaints except wanted to go home  Objective: Weight change:   Intake/Output Summary (Last 24 hours) at 04/07/13 1315 Last data filed at 04/07/13 0300  Gross per 24 hour  Intake 469.93 ml  Output      0 ml  Net 469.93 ml   Blood pressure 112/61, pulse 69, temperature 97.7 F (36.5 C), temperature source Oral, resp. rate 16, height 6' (1.829 m), weight 96.163 kg (212 lb), SpO2 100.00%.  Physical Exam: General: Alert and awake, oriented x3, not in any acute distress. CVS: S1-S2 clear, no murmur rubs or gallops Chest: clear to auscultation bilaterally, no wheezing, rales or rhonchi Abdomen: soft nontender, nondistended, normal bowel sounds  Extremities: no cyanosis, clubbing or edema noted bilaterally Neuro: Cranial nerves II-XII intact, no focal neurological deficits  Lab Results: Basic Metabolic Panel:  Recent  Labs Lab 04/06/13 0700 04/06/13 0805  NA 139 139  K 3.4* 3.5  CL 103 102  CO2 27 27  GLUCOSE 110* 117*  BUN 16 15  CREATININE 0.90 0.90  CALCIUM 8.9 9.0   Liver Function Tests: No results found for this basename: AST, ALT, ALKPHOS, BILITOT, PROT, ALBUMIN,  in the last 168 hours No results found for this basename: LIPASE, AMYLASE,  in the last 168 hours No results found for this basename: AMMONIA,  in the last 168 hours CBC:  Recent Labs Lab 04/06/13 0700 04/06/13 0805  WBC 7.2 7.0  HGB 13.3 13.4  HCT 37.9* 37.9*  MCV 89.0 88.6  PLT 156 148*   Cardiac Enzymes:  Recent Labs Lab 04/06/13 0805 04/06/13 1310 04/06/13 1925  CKTOTAL 436* 359* 287*  CKMB 29.1* 22.7* 14.2*  TROPONINI 6.38* 3.21* 2.24*   BNP: No components found with this basename: POCBNP,  CBG: No results found for this basename: GLUCAP,  in the last 168 hours   Micro Results: No results found for this or any previous visit (from the past 240 hour(s)).  Studies/Results: Dg Chest 2 View  04/05/2013   CLINICAL DATA:  Dizziness and syncope. History of hypertension.  EXAM: CHEST  2 VIEW  COMPARISON:  06/22/2011  FINDINGS: The heart size and mediastinal contours are within normal limits. Both lungs are clear. There has been a previous anterior cervical spine fusion. The bony thorax is intact.  IMPRESSION: No active cardiopulmonary disease.   Electronically Signed   By: Amie Portland   On: 04/05/2013 17:10   Ct Head Wo Contrast  04/05/2013  CLINICAL DATA:  At the sudden dizziness.  EXAM: CT HEAD WITHOUT CONTRAST  TECHNIQUE: Contiguous axial images were obtained from the base of the skull through the vertex without intravenous contrast.  COMPARISON:  None.  FINDINGS: Ventricles are normal in size and configuration.  There is a focus of well-defined fluid attenuation along the superior left cerebellum. This may reflect an old lacune infarct. It is nonspecific.  No other areas of abnormal parenchymal attenuation.  No mass effect. No evidence of a recent infarct.  No extra-axial masses or abnormal fluid collections.  There is no intracranial hemorrhage.  The visualized sinuses, mastoid air cells and middle ear cavities are clear.  IMPRESSION: Oval, well-defined fluid attenuation lesion of the superior left cerebellum. This could reflect an old lacune infarct or a dilated perivascular space. It is nonspecific.  There are no other abnormalities.   Electronically Signed   By: Amie Portland   On: 04/05/2013 16:39    Medications: Scheduled Meds: . aspirin EC  325 mg Oral Daily  . atorvastatin  40 mg Oral q1800  . hydrochlorothiazide  12.5 mg Oral Daily  . metoprolol tartrate  12.5 mg Oral BID  . multivitamin with minerals  1 tablet Oral Daily  . sodium chloride  3 mL Intravenous Q12H      LOS: 2 days   RAI,RIPUDEEP M.D. [facility] 04/07/2013, 1:15 PM Pager: [phone]  If 7PM-7AM, please contact night-coverage [email or web address] Password [email or web address]

## 2013-04-07 NOTE — Progress Notes (Signed)
ANTICOAGULATION CONSULT NOTE - Follow Up Consult  Pharmacy Consult for Heparin Indication: r/o PE  No Known Allergies  Patient Measurements: Height: 6' (182.9 cm) Weight: 212 lb (96.163 kg) IBW/kg (Calculated) : 77.6 Heparin Dosing Weight: 96kg  Vital Signs: Temp: 97.7 F (36.5 C) (09/29 0500) BP: 105/65 mmHg (09/29 0506) Pulse Rate: 55 (09/29 0743)  Labs:  Recent Labs  04/05/13 1610  04/06/13 0700 04/06/13 0805 04/06/13 1310 04/06/13 1437 04/06/13 1925 04/06/13 2319  HGB 13.4  --  13.3 13.4  --   --   --   --   HCT 38.3*  --  37.9* 37.9*  --   --   --   --   PLT 170  --  156 148*  --   --   --   --   LABPROT  --   --   --  12.8  --   --   --   --   INR  --   --   --  0.98  --   --   --   --   HEPARINUNFRC  --   --   --   --   --  0.22*  --  0.23*  CREATININE 1.04  --  0.90 0.90  --   --   --   --   CKTOTAL  --   --   --  436* 359*  --  287*  --   CKMB  --   --   --  29.1* 22.7*  --  14.2*  --   TROPONINI  --   < > 5.91* 6.38* 3.21*  --  2.24*  --   < > = values in this interval not displayed.  Estimated Creatinine Clearance: 94.4 ml/min (by C-G formula based on Cr of 0.9).  Assessment: 66yom initially on heparin for NSTEMI. He is now s/p cath and found to have patent coronary arteries w/o obstructive CAD. He will resume heparin for possible PE as he had SVT during the cath. VQ scan ordered for today.  Heparin to resume 4 hours after TR band removal. Per RN, TR band will be removed ~1045.   Heparin rate was increased to 1600 units/hr this morning but a follow up level was never checked since he went to cath. Will resume at this rate.  Goal of Therapy:  Heparin level 0.3-0.7 units/ml Monitor platelets by anticoagulation protocol: Yes   Plan:  1) At 1500, resume heparin at 1600 units/hr w/ NO bolus 2) Check 6 hour heparin level 3) Follow up VQ scan  Fredrik Rigger 04/07/2013,10:02 AM

## 2013-04-07 NOTE — Progress Notes (Signed)
  Echocardiogram 2D Echocardiogram has been performed.  Deann Mclaine 04/07/2013, 11:14 AM

## 2013-04-07 NOTE — Consult Note (Signed)
Reason for Consult: left atrial myxoma Referring Physician: Dr. Janne Lab Matthew Sullivan is an 67 y.o. male.  HPI: 67 yo male presents with cc/o dizziness.  67 yo male with PMH of TIA. He was walking his dog about a month ago when he felt extremely dizzy and almost fell. On the day of admission he was doing yard work. He felt dizzy and stopped what he was doing. His light headedness did not resolve with rest. He had a sense that something was very wrong and asked his wife to take him to the fire station. He tried to stand up but had to sit right back down. He began sweating profusely.   He was admitted and ruled in for MI. 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  Past Medical History  Diagnosis Date  . Hypertension   . Hyperlipidemia   . History of tinnitus   . Cervical osteoarthritis   . MI, old 62  . History of embolic stroke   . Stroke   . Melanoma     Past Surgical History  Procedure Laterality Date  . Back surgery    . Appendectomy    . Nose surgery    . Nasal septum surgery      Family History  Problem Relation Age of Onset  . Cancer Father     Social History:  reports that he quit smoking about 35 years ago. He does not have any smokeless tobacco history on file. He reports that he does not drink alcohol or use illicit drugs.  Allergies: No Known Allergies  Medications:  Prior to Admission:  Prescriptions prior to admission  Medication Sig Dispense Refill  . aspirin EC 81 MG tablet Take 81 mg by mouth every evening.      Marland Kitchen atorvastatin (LIPITOR) 80 MG tablet Take 40 mg by mouth every evening.      . celecoxib (CELEBREX) 200 MG capsule Take 200 mg by mouth daily as needed for pain.      . hydrochlorothiazide (MICROZIDE) 12.5 MG capsule Take 12.5 mg by mouth every evening.        Results for orders placed during the hospital encounter of 04/05/13 (from the past 48 hour(s))   URINE RAPID DRUG SCREEN (HOSP PERFORMED)     Status: None   Collection Time    04/05/13  5:31 PM      Result Value Range   Opiates NONE DETECTED  NONE DETECTED   Cocaine NONE DETECTED  NONE DETECTED   Benzodiazepines NONE DETECTED  NONE DETECTED   Amphetamines NONE DETECTED  NONE DETECTED   Tetrahydrocannabinol NONE DETECTED  NONE DETECTED   Barbiturates NONE DETECTED  NONE DETECTED   Comment:            DRUG SCREEN FOR MEDICAL PURPOSES     ONLY.  IF CONFIRMATION IS NEEDED     FOR ANY PURPOSE, NOTIFY LAB     WITHIN 5 DAYS.                LOWEST DETECTABLE LIMITS     FOR URINE DRUG SCREEN     Drug Class       Cutoff (ng/mL)     Amphetamine      1000     Barbiturate      200     Benzodiazepine   200     Tricyclics       300  Opiates          300     Cocaine          300     THC              50  URINALYSIS, ROUTINE W REFLEX MICROSCOPIC     Status: None   Collection Time    04/05/13  5:31 PM      Result Value Range   Color, Urine YELLOW  YELLOW   APPearance CLEAR  CLEAR   Specific Gravity, Urine 1.013  1.005 - 1.030   pH 6.0  5.0 - 8.0   Glucose, UA NEGATIVE  NEGATIVE mg/dL   Hgb urine dipstick NEGATIVE  NEGATIVE   Bilirubin Urine NEGATIVE  NEGATIVE   Ketones, ur NEGATIVE  NEGATIVE mg/dL   Protein, ur NEGATIVE  NEGATIVE mg/dL   Urobilinogen, UA 0.2  0.0 - 1.0 mg/dL   Nitrite NEGATIVE  NEGATIVE   Leukocytes, UA NEGATIVE  NEGATIVE   Comment: MICROSCOPIC NOT DONE ON URINES WITH NEGATIVE PROTEIN, BLOOD, LEUKOCYTES, NITRITE, OR GLUCOSE <1000 mg/dL.  TROPONIN I     Status: Abnormal   Collection Time    04/05/13  7:03 PM      Result Value Range   Troponin I 1.90 (*) <0.30 ng/mL   Comment:            Due to the release kinetics of cTnI,     a negative result within the first hours     of the onset of symptoms does not rule out     myocardial infarction with certainty.     If myocardial infarction is still suspected,     repeat the test at appropriate intervals.      REPEATED TO VERIFY     CRITICAL RESULT CALLED TO, READ BACK BY AND VERIFIED WITH:     D.DUVALL,RN 2030 04/05/13 M.CAMPBELL  TROPONIN I     Status: Abnormal   Collection Time    04/06/13 12:40 AM      Result Value Range   Troponin I 8.00 (*) <0.30 ng/mL   Comment:            Due to the release kinetics of cTnI,     a negative result within the first hours     of the onset of symptoms does not rule out     myocardial infarction with certainty.     If myocardial infarction is still suspected,     repeat the test at appropriate intervals.     CRITICAL VALUE NOTED.  VALUE IS CONSISTENT WITH PREVIOUSLY REPORTED AND CALLED VALUE.     REPEATED TO VERIFY  CBC     Status: Abnormal   Collection Time    04/06/13  7:00 AM      Result Value Range   WBC 7.2  4.0 - 10.5 K/uL   RBC 4.26  4.22 - 5.81 MIL/uL   Hemoglobin 13.3  13.0 - 17.0 g/dL   HCT 16.1 (*) 09.6 - 04.5 %   MCV 89.0  78.0 - 100.0 fL   MCH 31.2  26.0 - 34.0 pg   MCHC 35.1  30.0 - 36.0 g/dL   RDW 40.9  81.1 - 91.4 %   Platelets 156  150 - 400 K/uL  BASIC METABOLIC PANEL     Status: Abnormal   Collection Time    04/06/13  7:00 AM      Result Value Range   Sodium 139  135 - 145 mEq/L   Potassium 3.4 (*) 3.5 - 5.1 mEq/L   Chloride 103  96 - 112 mEq/L   CO2 27  19 - 32 mEq/L   Glucose, Bld 110 (*) 70 - 99 mg/dL   BUN 16  6 - 23 mg/dL   Creatinine, Ser 1.61  0.50 - 1.35 mg/dL   Calcium 8.9  8.4 - 09.6 mg/dL   GFR calc non Af Amer 85 (*) >90 mL/min   GFR calc Af Amer >90  >90 mL/min   Comment: (NOTE)     The eGFR has been calculated using the CKD EPI equation.     This calculation has not been validated in all clinical situations.     eGFR's persistently <90 mL/min signify possible Chronic Kidney     Disease.  TROPONIN I     Status: Abnormal   Collection Time    04/06/13  7:00 AM      Result Value Range   Troponin I 5.91 (*) <0.30 ng/mL   Comment:            Due to the release kinetics of cTnI,     a negative result  within the first hours     of the onset of symptoms does not rule out     myocardial infarction with certainty.     If myocardial infarction is still suspected,     repeat the test at appropriate intervals.     REPEATED TO VERIFY     CRITICAL VALUE NOTED.  VALUE IS CONSISTENT WITH PREVIOUSLY REPORTED AND CALLED VALUE.  TROPONIN I     Status: Abnormal   Collection Time    04/06/13  8:05 AM      Result Value Range   Troponin I 6.38 (*) <0.30 ng/mL   Comment:            Due to the release kinetics of cTnI,     a negative result within the first hours     of the onset of symptoms does not rule out     myocardial infarction with certainty.     If myocardial infarction is still suspected,     repeat the test at appropriate intervals.     REPEATED TO VERIFY     CRITICAL VALUE NOTED.  VALUE IS CONSISTENT WITH PREVIOUSLY REPORTED AND CALLED VALUE.  CK TOTAL AND CKMB     Status: Abnormal   Collection Time    04/06/13  8:05 AM      Result Value Range   Total CK 436 (*) 7 - 232 U/L   CK, MB 29.1 (*) 0.3 - 4.0 ng/mL   Comment: CRITICAL RESULT CALLED TO, READ BACK BY AND VERIFIED WITH:     WORRELL J.,RN 04/06/13 0955 BY JONESJ   Relative Index 6.7 (*) 0.0 - 2.5  BASIC METABOLIC PANEL     Status: Abnormal   Collection Time    04/06/13  8:05 AM      Result Value Range   Sodium 139  135 - 145 mEq/L   Potassium 3.5  3.5 - 5.1 mEq/L   Chloride 102  96 - 112 mEq/L   CO2 27  19 - 32 mEq/L   Glucose, Bld 117 (*) 70 - 99 mg/dL   BUN 15  6 - 23 mg/dL   Creatinine, Ser 0.45  0.50 - 1.35 mg/dL   Calcium 9.0  8.4 - 40.9 mg/dL   GFR calc non Af Amer 85 (*) >  90 mL/min   GFR calc Af Amer >90  >90 mL/min   Comment: (NOTE)     The eGFR has been calculated using the CKD EPI equation.     This calculation has not been validated in all clinical situations.     eGFR's persistently <90 mL/min signify possible Chronic Kidney     Disease.  CBC     Status: Abnormal   Collection Time    04/06/13  8:05 AM       Result Value Range   WBC 7.0  4.0 - 10.5 K/uL   RBC 4.28  4.22 - 5.81 MIL/uL   Hemoglobin 13.4  13.0 - 17.0 g/dL   HCT 40.9 (*) 81.1 - 91.4 %   MCV 88.6  78.0 - 100.0 fL   MCH 31.3  26.0 - 34.0 pg   MCHC 35.4  30.0 - 36.0 g/dL   RDW 78.2  95.6 - 21.3 %   Platelets 148 (*) 150 - 400 K/uL  PROTIME-INR     Status: None   Collection Time    04/06/13  8:05 AM      Result Value Range   Prothrombin Time 12.8  11.6 - 15.2 seconds   INR 0.98  0.00 - 1.49  TROPONIN I     Status: Abnormal   Collection Time    04/06/13  1:10 PM      Result Value Range   Troponin I 3.21 (*) <0.30 ng/mL   Comment:            Due to the release kinetics of cTnI,     a negative result within the first hours     of the onset of symptoms does not rule out     myocardial infarction with certainty.     If myocardial infarction is still suspected,     repeat the test at appropriate intervals.     REPEATED TO VERIFY     CRITICAL VALUE NOTED.  VALUE IS CONSISTENT WITH PREVIOUSLY REPORTED AND CALLED VALUE.  CK TOTAL AND CKMB     Status: Abnormal   Collection Time    04/06/13  1:10 PM      Result Value Range   Total CK 359 (*) 7 - 232 U/L   CK, MB 22.7 (*) 0.3 - 4.0 ng/mL   Comment: CRITICAL VALUE NOTED.  VALUE IS CONSISTENT WITH PREVIOUSLY REPORTED AND CALLED VALUE.   Relative Index 6.3 (*) 0.0 - 2.5  HEPARIN LEVEL (UNFRACTIONATED)     Status: Abnormal   Collection Time    04/06/13  2:37 PM      Result Value Range   Heparin Unfractionated 0.22 (*) 0.30 - 0.70 IU/mL   Comment:            IF HEPARIN RESULTS ARE BELOW     EXPECTED VALUES, AND PATIENT     DOSAGE HAS BEEN CONFIRMED,     SUGGEST FOLLOW UP TESTING     OF ANTITHROMBIN III LEVELS.  TROPONIN I     Status: Abnormal   Collection Time    04/06/13  7:25 PM      Result Value Range   Troponin I 2.24 (*) <0.30 ng/mL   Comment:            Due to the release kinetics of cTnI,     a negative result within the first hours     of the onset of  symptoms does not rule out     myocardial  infarction with certainty.     If myocardial infarction is still suspected,     repeat the test at appropriate intervals.     REPEATED TO VERIFY     CRITICAL VALUE NOTED.  VALUE IS CONSISTENT WITH PREVIOUSLY REPORTED AND CALLED VALUE.  CK TOTAL AND CKMB     Status: Abnormal   Collection Time    04/06/13  7:25 PM      Result Value Range   Total CK 287 (*) 7 - 232 U/L   CK, MB 14.2 (*) 0.3 - 4.0 ng/mL   Comment: CRITICAL VALUE NOTED.  VALUE IS CONSISTENT WITH PREVIOUSLY REPORTED AND CALLED VALUE.   Relative Index 4.9 (*) 0.0 - 2.5  HEPARIN LEVEL (UNFRACTIONATED)     Status: Abnormal   Collection Time    04/06/13 11:19 PM      Result Value Range   Heparin Unfractionated 0.23 (*) 0.30 - 0.70 IU/mL   Comment:            IF HEPARIN RESULTS ARE BELOW     EXPECTED VALUES, AND PATIENT     DOSAGE HAS BEEN CONFIRMED,     SUGGEST FOLLOW UP TESTING     OF ANTITHROMBIN III LEVELS.    Dg Chest 2 View  04/05/2013   CLINICAL DATA:  Dizziness and syncope. History of hypertension.  EXAM: CHEST  2 VIEW  COMPARISON:  06/22/2011  FINDINGS: The heart size and mediastinal contours are within normal limits. Both lungs are clear. There has been a previous anterior cervical spine fusion. The bony thorax is intact.  IMPRESSION: No active cardiopulmonary disease.   Electronically Signed   By: Amie Portland   On: 04/05/2013 17:10    Review of Systems  Constitutional: Positive for diaphoresis.  Respiratory: Positive for shortness of breath.   Cardiovascular: Negative for chest pain.  Neurological: Positive for dizziness.  All other systems reviewed and are negative.   Blood pressure 112/61, pulse 69, temperature 97.7 F (36.5 C), temperature source Oral, resp. rate 16, height 6' (1.829 m), weight 212 lb (96.163 kg), SpO2 100.00%. Physical Exam  Vitals reviewed. Constitutional: He is oriented to person, place, and time. He appears well-developed and  well-nourished. No distress.  HENT:  Head: Normocephalic and atraumatic.  Eyes: EOM are normal. Pupils are equal, round, and reactive to light.  Neck: Neck supple. No thyromegaly present.  Cardiovascular: Normal rate, regular rhythm and normal heart sounds.  Exam reveals no gallop and no friction rub.   No murmur heard. Respiratory: Effort normal and breath sounds normal. He has no wheezes. He has no rales.  GI: Soft. There is no tenderness.  Musculoskeletal: He exhibits no edema.  Lymphadenopathy:    He has no cervical adenopathy.  Neurological: He is alert and oriented to person, place, and time. No cranial nerve deficit.  Skin: Skin is warm and dry.    Assessment/Plan: 67 yo WM with a presyncopal spell and NQWMI from a left atrial myxoma. This is a large tumor that is prolapsing through the mitral valve. He is at extremely high risk for embolic complications and hemodynamic collapse. Surgery is indicated for survival benefit and relief of symptoms.  I have discussed with the patient the general nature of the procedure, the need for general anesthesia, the use of cardiopulmonary bypass, and the incisions to be used. I have discussed the expected hospital stay, overall recovery and short and long term outcomes. He understands the risks include but are not limited to death,  stroke, MI, DVT/PE, bleeding, possible need for transfusion, infections,other organ system dysfunction including respiratory, renal, or GI complications. He accepts the risks and agrees to proceed.  Plan OR Wednesday 10/1  Marifer Hurd C 04/07/2013, 4:38 PM

## 2013-04-07 NOTE — Progress Notes (Signed)
Phone report from Dr. Jens Som reveals that the patient has a very large left atrial myxoma prolapsing in and out of his mitral outflow tract.  I have called thoracic surgeons for consultation.  The finding of the myxoma could certainly explain his otherwise unexplained syncope.

## 2013-04-07 NOTE — H&P (View-Only) (Signed)
Subjective:  Patient seen briefly this am as he was being transported to cath lab. Patient feels well. No chest pain overnight. No further dizziness or syncope.  Objective:  Vital Signs in the last 24 hours: Temp:  [97.6 F (36.4 C)-98.1 F (36.7 C)] 97.7 F (36.5 C) (09/29 0500) Pulse Rate:  [56-78] 78 (09/29 0506) Resp:  [16-18] 16 (09/29 0500) BP: (105-121)/(60-76) 105/65 mmHg (09/29 0506) SpO2:  [96 %-98 %] 96 % (09/29 0500)  Intake/Output from previous day: 09/28 0701 - 09/29 0700 In: 475.3 [P.O.:240; I.V.:235.3] Out: -  Intake/Output from this shift:    . aspirin EC  325 mg Oral Daily  . atorvastatin  40 mg Oral q1800  . hydrochlorothiazide  12.5 mg Oral Daily  . metoprolol tartrate  12.5 mg Oral BID  . multivitamin with minerals  1 tablet Oral Daily  . sodium chloride  3 mL Intravenous Q12H   . sodium chloride 1 mL/kg/hr (04/07/13 0444)    Physical Exam: The patient appears to be in no distress.  Head and neck exam reveals that the pupils are equal and reactive.  The extraocular movements are full.  There is no scleral icterus.  Mouth and pharynx are benign.  No lymphadenopathy.  No carotid bruits.  The jugular venous pressure is normal.  Thyroid is not enlarged or tender.  Chest is clear to percussion and auscultation.  No rales or rhonchi.  Expansion of the chest is symmetrical.  Heart reveals no abnormal lift or heave.  First and second heart sounds are normal.  There is no murmur gallop rub or click.  The abdomen is soft and nontender.  Bowel sounds are normoactive.  There is no hepatosplenomegaly or mass.  There are no abdominal bruits.  Extremities reveal no phlebitis or edema.  Pedal pulses are good.  There is no cyanosis or clubbing.  Neurologic exam is normal strength and no lateralizing weakness.  No sensory deficits.  Integument reveals no rash  Lab Results:  Recent Labs  04/06/13 0700 04/06/13 0805  WBC 7.2 7.0  HGB 13.3 13.4  PLT 156  148*    Recent Labs  04/06/13 0700 04/06/13 0805  NA 139 139  K 3.4* 3.5  CL 103 102  CO2 27 27  GLUCOSE 110* 117*  BUN 16 15  CREATININE 0.90 0.90    Recent Labs  04/06/13 1310 04/06/13 1925  TROPONINI 3.21* 2.24*   Hepatic Function Panel No results found for this basename: PROT, ALBUMIN, AST, ALT, ALKPHOS, BILITOT, BILIDIR, IBILI,  in the last 72 hours No results found for this basename: CHOL,  in the last 72 hours No results found for this basename: PROTIME,  in the last 72 hours  Imaging: Dg Chest 2 View  04/05/2013   CLINICAL DATA:  Dizziness and syncope. History of hypertension.  EXAM: CHEST  2 VIEW  COMPARISON:  06/22/2011  FINDINGS: The heart size and mediastinal contours are within normal limits. Both lungs are clear. There has been a previous anterior cervical spine fusion. The bony thorax is intact.  IMPRESSION: No active cardiopulmonary disease.   Electronically Signed   By: Amie Portland   On: 04/05/2013 17:10   Ct Head Wo Contrast  04/05/2013   CLINICAL DATA:  At the sudden dizziness.  EXAM: CT HEAD WITHOUT CONTRAST  TECHNIQUE: Contiguous axial images were obtained from the base of the skull through the vertex without intravenous contrast.  COMPARISON:  None.  FINDINGS: Ventricles are normal in size and  configuration.  There is a focus of well-defined fluid attenuation along the superior left cerebellum. This may reflect an old lacune infarct. It is nonspecific.  No other areas of abnormal parenchymal attenuation. No mass effect. No evidence of a recent infarct.  No extra-axial masses or abnormal fluid collections.  There is no intracranial hemorrhage.  The visualized sinuses, mastoid air cells and middle ear cavities are clear.  IMPRESSION: Oval, well-defined fluid attenuation lesion of the superior left cerebellum. This could reflect an old lacune infarct or a dilated perivascular space. It is nonspecific.  There are no other abnormalities.   Electronically Signed    By: Amie Portland   On: 04/05/2013 16:39    Cardiac Studies: Left heart cath this am pending. 2D echo pending. Assessment/Plan:  1. NSTEMI - Troponin peak 8, CK-MB 29, now downtrending, the patient is pain free  - continue ASA 325 mg,, Metoprolol, atorvastatin  - Cath today - Echo - pending. 2. Hypertension - well controlled on current regimen  3. Hyperlipdemia - well controlled on Lipitor 40 mg  4. Hypokalemia - resolved.  Plan: possibly home later today depending on results of cath and echo.   LOS: 2 days    Cassell Clement 04/07/2013, 7:34 AM

## 2013-04-07 NOTE — Interval H&P Note (Signed)
History and Physical Interval Note:  04/07/2013 8:03 AM  Matthew Sullivan  has presented today for surgery, with the diagnosis of cp  The various methods of treatment have been discussed with the patient and family. After consideration of risks, benefits and other options for treatment, the patient has consented to  Procedure(s): LEFT HEART CATHETERIZATION WITH CORONARY ANGIOGRAM (N/A) as a surgical intervention .  The patient's history has been reviewed, patient examined, no change in status, stable for surgery.  I have reviewed the patient's chart and labs.  Questions were answered to the patient's satisfaction.    Cath Lab Visit (complete for each Cath Lab visit)  Clinical Evaluation Leading to the Procedure:   ACS: yes  Non-ACS:    Anginal Classification: CCS IV  Anti-ischemic medical therapy: Minimal Therapy (1 class of medications)  Non-Invasive Test Results: No non-invasive testing performed  Prior CABG: No previous CABG        Tonny Bollman

## 2013-04-08 ENCOUNTER — Inpatient Hospital Stay (HOSPITAL_COMMUNITY): Payer: Medicare Other

## 2013-04-08 DIAGNOSIS — I119 Hypertensive heart disease without heart failure: Secondary | ICD-10-CM

## 2013-04-08 LAB — CBC
HCT: 36.3 % — ABNORMAL LOW (ref 39.0–52.0)
Hemoglobin: 12.2 g/dL — ABNORMAL LOW (ref 13.0–17.0)
MCH: 30.3 pg (ref 26.0–34.0)
MCHC: 33.6 g/dL (ref 30.0–36.0)
MCV: 90.1 fL (ref 78.0–100.0)
Platelets: 151 K/uL (ref 150–400)
RBC: 4.03 MIL/uL — ABNORMAL LOW (ref 4.22–5.81)
RDW: 13.7 % (ref 11.5–15.5)
WBC: 6.7 K/uL (ref 4.0–10.5)

## 2013-04-08 LAB — SURGICAL PCR SCREEN: MRSA, PCR: NEGATIVE

## 2013-04-08 LAB — TYPE AND SCREEN: ABO/RH(D): A NEG

## 2013-04-08 LAB — BLOOD GAS, ARTERIAL
Acid-Base Excess: 2.2 mmol/L — ABNORMAL HIGH (ref 0.0–2.0)
Bicarbonate: 26.4 mEq/L — ABNORMAL HIGH (ref 20.0–24.0)
FIO2: 0.21 %
O2 Saturation: 96 %
Patient temperature: 98.6

## 2013-04-08 LAB — ABO/RH: ABO/RH(D): A NEG

## 2013-04-08 MED ORDER — EPINEPHRINE HCL 1 MG/ML IJ SOLN
0.5000 ug/min | INTRAVENOUS | Status: DC
  Filled 2013-04-08: qty 4

## 2013-04-08 MED ORDER — PLASMA-LYTE 148 IV SOLN
INTRAVENOUS | Status: DC
  Filled 2013-04-08: qty 2.5

## 2013-04-08 MED ORDER — CHLORHEXIDINE GLUCONATE 4 % EX LIQD
60.0000 mL | Freq: Once | CUTANEOUS | Status: AC
  Administered 2013-04-08: 4 via TOPICAL
  Filled 2013-04-08: qty 60

## 2013-04-08 MED ORDER — DIAZEPAM 5 MG PO TABS
5.0000 mg | ORAL_TABLET | Freq: Once | ORAL | Status: AC
  Administered 2013-04-09: 5 mg via ORAL
  Filled 2013-04-08: qty 1

## 2013-04-08 MED ORDER — CHLORHEXIDINE GLUCONATE 4 % EX LIQD
60.0000 mL | Freq: Once | CUTANEOUS | Status: AC
  Administered 2013-04-09: 4 via TOPICAL
  Filled 2013-04-08: qty 45

## 2013-04-08 MED ORDER — SODIUM CHLORIDE 0.9 % IV SOLN
INTRAVENOUS | Status: DC
  Administered 2013-04-09: 70 mL/h via INTRAVENOUS
  Filled 2013-04-08: qty 40

## 2013-04-08 MED ORDER — POTASSIUM CHLORIDE 2 MEQ/ML IV SOLN
80.0000 meq | INTRAVENOUS | Status: DC
  Filled 2013-04-08: qty 40

## 2013-04-08 MED ORDER — DOPAMINE-DEXTROSE 3.2-5 MG/ML-% IV SOLN
2.0000 ug/kg/min | INTRAVENOUS | Status: DC
  Filled 2013-04-08: qty 250

## 2013-04-08 MED ORDER — NITROGLYCERIN IN D5W 200-5 MCG/ML-% IV SOLN
2.0000 ug/min | INTRAVENOUS | Status: DC
  Administered 2013-04-09: 5 ug/min via INTRAVENOUS
  Filled 2013-04-08: qty 250

## 2013-04-08 MED ORDER — TEMAZEPAM 15 MG PO CAPS: 15.0000 mg | ORAL_CAPSULE | Freq: Once | ORAL | Status: AC | PRN

## 2013-04-08 MED ORDER — SODIUM CHLORIDE 0.9 % IV SOLN
INTRAVENOUS | Status: DC
  Administered 2013-04-09: 1.5 [IU]/h via INTRAVENOUS
  Filled 2013-04-08: qty 1

## 2013-04-08 MED ORDER — DEXTROSE 5 % IV SOLN
750.0000 mg | INTRAVENOUS | Status: DC
  Filled 2013-04-08: qty 750

## 2013-04-08 MED ORDER — ALPRAZOLAM 0.25 MG PO TABS: 0.2500 mg | ORAL_TABLET | ORAL | Status: DC | PRN

## 2013-04-08 MED ORDER — MAGNESIUM SULFATE 50 % IJ SOLN
40.0000 meq | INTRAMUSCULAR | Status: DC
  Filled 2013-04-08: qty 10

## 2013-04-08 MED ORDER — METOPROLOL TARTRATE 12.5 MG HALF TABLET
12.5000 mg | ORAL_TABLET | Freq: Once | ORAL | Status: AC
  Administered 2013-04-09: 12.5 mg via ORAL
  Filled 2013-04-08: qty 1

## 2013-04-08 MED ORDER — DEXTROSE 5 % IV SOLN
1.5000 g | INTRAVENOUS | Status: DC
  Administered 2013-04-09: 1.5 g via INTRAVENOUS
  Filled 2013-04-08: qty 1.5

## 2013-04-08 MED ORDER — BISACODYL 5 MG PO TBEC
5.0000 mg | DELAYED_RELEASE_TABLET | Freq: Once | ORAL | Status: AC
  Administered 2013-04-08: 5 mg via ORAL
  Filled 2013-04-08: qty 1

## 2013-04-08 MED ORDER — PHENYLEPHRINE HCL 10 MG/ML IJ SOLN
30.0000 ug/min | INTRAVENOUS | Status: DC
  Administered 2013-04-09: 40 ug/min via INTRAVENOUS
  Filled 2013-04-08: qty 2

## 2013-04-08 MED ORDER — DEXMEDETOMIDINE HCL IN NACL 400 MCG/100ML IV SOLN
0.1000 ug/kg/h | INTRAVENOUS | Status: DC
  Administered 2013-04-09: 0.2 ug/kg/h via INTRAVENOUS
  Filled 2013-04-08: qty 100

## 2013-04-08 MED ORDER — VANCOMYCIN HCL 10 G IV SOLR
1250.0000 mg | INTRAVENOUS | Status: DC
  Administered 2013-04-09: 1250 mg via INTRAVENOUS
  Filled 2013-04-08: qty 1250

## 2013-04-08 MED ORDER — SODIUM CHLORIDE 0.9 % IV SOLN
INTRAVENOUS | Status: DC
  Filled 2013-04-08: qty 30

## 2013-04-08 NOTE — Progress Notes (Signed)
ANTICOAGULATION CONSULT NOTE - Follow Up Consult  Pharmacy Consult for heparin Indication: r/o PE  Labs:  Recent Labs  04/05/13 1610  04/06/13 0700 04/06/13 0805 04/06/13 1310  04/06/13 1925 04/06/13 2319 04/07/13 2240 04/08/13 0450  HGB 13.4  --  13.3 13.4  --   --   --   --   --  12.2*  HCT 38.3*  --  37.9* 37.9*  --   --   --   --   --  36.3*  PLT 170  --  156 148*  --   --   --   --   --  151  LABPROT  --   --   --  12.8  --   --   --   --   --   --   INR  --   --   --  0.98  --   --   --   --   --   --   HEPARINUNFRC  --   --   --   --   --   < >  --  0.23* 0.33 0.29*  CREATININE 1.04  --  0.90 0.90  --   --   --   --   --   --   CKTOTAL  --   --   --  436* 359*  --  287*  --   --   --   CKMB  --   --   --  29.1* 22.7*  --  14.2*  --   --   --   TROPONINI  --   < > 5.91* 6.38* 3.21*  --  2.24*  --   --   --   < > = values in this interval not displayed.   Assessment: 67yo male now slightly subtherapeutic on heparin after one level at low end of goal.  Goal of Therapy:  Heparin level 0.3-0.7 units/ml   Plan:  Will increase heparin gtt by 1 unit/kg/hr to 1700 units/hr and check level in 6hr.  Vernard Gambles, PharmD, BCPS  04/08/2013,6:06 AM

## 2013-04-08 NOTE — Progress Notes (Signed)
No complaints today  Anxious about surgery and about getting back to work  BP 105/72  Pulse 55  Temp(Src) 98 F (36.7 C) (Oral)  Resp 18  Ht 6' (1.829 m)  Wt 212 lb (96.163 kg)  BMI 28.75 kg/m2  SpO2 98%   Intake/Output Summary (Last 24 hours) at 04/08/13 1217 Last data filed at 04/08/13 0500  Gross per 24 hour  Intake 845.43 ml  Output      0 ml  Net 845.43 ml    Exam unchanged  For excision of left atrial myxoma in AM  All questions answered

## 2013-04-08 NOTE — Progress Notes (Signed)
Subjective:  Patient was found to have a very large mobile left atrial myxoma by two-dimensional echocardiogram yesterday.  He is scheduled for surgery tomorrow by Dr. Dorris Fetch.  He has had no further episodes of dizziness or syncope.  He denies any chest pain.  He is disappointed that he will be out of work for several months after his open-heart surgery.  Objective:  Vital Signs in the last 24 hours: Temp:  [98 F (36.7 C)-98.8 F (37.1 C)] 98 F (36.7 C) (09/30 0535) Pulse Rate:  [53-69] 55 (09/30 0535) Resp:  [18] 18 (09/30 0535) BP: (105-144)/(59-113) 105/72 mmHg (09/30 0535) SpO2:  [94 %-100 %] 98 % (09/30 0535)  Intake/Output from previous day: 09/29 0701 - 09/30 0700 In: 845.4 [I.V.:845.4] Out: -  Intake/Output from this shift:    . aspirin EC  325 mg Oral Daily  . atorvastatin  40 mg Oral q1800  . hydrochlorothiazide  12.5 mg Oral Daily  . metoprolol tartrate  12.5 mg Oral BID  . multivitamin with minerals  1 tablet Oral Daily  . sodium chloride  3 mL Intravenous Q12H   . heparin 1,700 Units/hr (04/08/13 1610)    Physical Exam: The patient appears to be in no distress.  Head and neck exam reveals that the pupils are equal and reactive.  The extraocular movements are full.  There is no scleral icterus.  Mouth and pharynx are benign.  No lymphadenopathy.  No carotid bruits.  The jugular venous pressure is normal.  Thyroid is not enlarged or tender.  Chest is clear to percussion and auscultation.  No rales or rhonchi.  Expansion of the chest is symmetrical.  Heart reveals no abnormal lift or heave.  First and second heart sounds are normal.  There is no murmur gallop rub or click.  The abdomen is soft and nontender.  Bowel sounds are normoactive.  There is no hepatosplenomegaly or mass.  There are no abdominal bruits.  Extremities reveal no phlebitis or edema.  Pedal pulses are good.  There is no cyanosis or clubbing.  Neurologic exam is normal strength and no  lateralizing weakness.  No sensory deficits.  Integument reveals no rash  Lab Results:  Recent Labs  04/06/13 0805 04/08/13 0450  WBC 7.0 6.7  HGB 13.4 12.2*  PLT 148* 151    Recent Labs  04/06/13 0700 04/06/13 0805  NA 139 139  K 3.4* 3.5  CL 103 102  CO2 27 27  GLUCOSE 110* 117*  BUN 16 15  CREATININE 0.90 0.90    Recent Labs  04/06/13 1310 04/06/13 1925  TROPONINI 3.21* 2.24*   Hepatic Function Panel No results found for this basename: PROT, ALBUMIN, AST, ALT, ALKPHOS, BILITOT, BILIDIR, IBILI,  in the last 72 hours No results found for this basename: CHOL,  in the last 72 hours No results found for this basename: PROTIME,  in the last 72 hours  Imaging: No results found.  Cardiac Studies: Left heart cath reveals clean coronaries 2D echo reveals large left atrial myxoma prolapsing into the mitral valve orifice. Assessment/Plan:  1. NSTEMI - Troponin peak 8, CK-MB 29, now downtrending, the patient is pain free.  The non-STEMI may have been secondary to a coronary artery embolus from the left atrial myxoma.  - continue ASA 325 mg,, Metoprolol, atorvastatin   2. Hypertension - well controlled on current regimen  3. Hyperlipdemia - well controlled on Lipitor 40 mg  4. Hypokalemia - resolved.  Plan: Surgery Wednesday for removal  of left atrial myxoma.   LOS: 3 days    Cassell Clement 04/08/2013, 8:30 AM

## 2013-04-08 NOTE — Progress Notes (Signed)
Patient ID: Matthew Sullivan  male  ZOX:[id number]    DOB: February 12, 1946    DOA: 04/05/2013  PCP: Cassell Clement, MD  Brief interim history Patient is a 67 year old male with history of hypertension, hyperlipidemia had presented to ER with intermittent dizziness for last 1 month. On the day of admission patient had reported that he was outside working in his your, doing some heavy lifting when he started to feel dizzy. Even after resting, it was persistent and came to ED for further workup.  Patient was found to have elevated troponins, peaked at 8.0 and with the concern of NSTEMI, he was placed on heparin drip and underwent cardiac cath on 04/07/13. Cardiac cath however was clean with normal coronaries. 2-D echo showed large left atrial myxoma prolapsing in and out of mitral outflow tract.  He is scheduled to have CT surgery tomorrow.  Consults  Cardiology, Dr. [name] Cardiothoracic surgery: Dr. Dorris Fetch  Assessment/Plan: Principal Problem: Elevated troponins with Intermittent dizziness for last one month/ syncope.Troponin 1.9 at the time of admission, then trended up to 8.0, . 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 - V/Q scan canceled by cardiology - CT surgery planned for tomorrow  Active Problems:   Pure hypercholesterolemia: - Continue statin    HTN (hypertension): - Currently stable, continue beta blocker, HCTZ  DVT Prophylaxis: Currently on heparin drip  Code Status:  Disposition:    Subjective:  Patient seen this morning, no complaints, worried about the hospital bills as he also lost his job yesterday  Objective: Weight change:   Intake/Output Summary (Last 24 hours) at 04/08/13 1347 Last data filed at 04/08/13 0500  Gross per 24 hour  Intake  845.43 ml  Output      0 ml  Net 845.43 ml   Blood pressure 105/72, pulse 55, temperature 98 F (36.7 C), temperature source Oral, resp. rate 18, height 6' (1.829 m), weight 96.163 kg (212 lb), SpO2 98.00%.  Physical Exam: General: Alert and awake, oriented x3, not in any acute distress. CVS: S1-S2 clear, no murmur rubs or gallops Chest: clear to auscultation bilaterally, no wheezing, rales or rhonchi Abdomen: soft nontender, nondistended, normal bowel sounds  Extremities: no cyanosis, clubbing or edema noted bilaterally Neuro: Cranial nerves II-XII intact, no focal neurological deficits  Lab Results: Basic Metabolic Panel:  Recent Labs Lab 04/06/13 0700 04/06/13 0805  NA 139 139  K 3.4* 3.5  CL 103 102  CO2 27 27  GLUCOSE 110* 117*  BUN 16 15  CREATININE 0.90 0.90  CALCIUM 8.9 9.0   Liver Function Tests: No results found for this basename: AST, ALT, ALKPHOS, BILITOT, PROT, ALBUMIN,  in the last 168 hours No results found for this basename: LIPASE, AMYLASE,  in the last 168 hours No results found for this basename: AMMONIA,  in the last 168 hours CBC:  Recent Labs Lab 04/06/13 0805 04/08/13 0450  WBC 7.0 6.7  HGB 13.4 12.2*  HCT 37.9* 36.3*  MCV 88.6 90.1  PLT 148* 151   Cardiac Enzymes:  Recent Labs Lab 04/06/13 0805 04/06/13 1310 04/06/13 1925  CKTOTAL 436* 359* 287*  CKMB 29.1* 22.7* 14.2*  TROPONINI 6.38* 3.21* 2.24*   BNP: No components found with this basename: POCBNP,  CBG: No results found for this basename: GLUCAP,  in the  last 168 hours   Micro Results: No results found for this or any previous visit (from the past 240 hour(s)).  Studies/Results: Dg Chest 2 View  04/05/2013   CLINICAL DATA:  Dizziness and syncope. History of hypertension.  EXAM: CHEST  2 VIEW  COMPARISON:  06/22/2011  FINDINGS: The heart size and mediastinal contours are within normal limits. Both lungs are clear. There has been a previous anterior cervical spine fusion.  The bony thorax is intact.  IMPRESSION: No active cardiopulmonary disease.   Electronically Signed   By: Amie Portland   On: 04/05/2013 17:10   Ct Head Wo Contrast  04/05/2013   CLINICAL DATA:  At the sudden dizziness.  EXAM: CT HEAD WITHOUT CONTRAST  TECHNIQUE: Contiguous axial images were obtained from the base of the skull through the vertex without intravenous contrast.  COMPARISON:  None.  FINDINGS: Ventricles are normal in size and configuration.  There is a focus of well-defined fluid attenuation along the superior left cerebellum. This may reflect an old lacune infarct. It is nonspecific.  No other areas of abnormal parenchymal attenuation. No mass effect. No evidence of a recent infarct.  No extra-axial masses or abnormal fluid collections.  There is no intracranial hemorrhage.  The visualized sinuses, mastoid air cells and middle ear cavities are clear.  IMPRESSION: Oval, well-defined fluid attenuation lesion of the superior left cerebellum. This could reflect an old lacune infarct or a dilated perivascular space. It is nonspecific.  There are no other abnormalities.   Electronically Signed   By: Amie Portland   On: 04/05/2013 16:39    Medications: Scheduled Meds: . Melene Muller ON 04/09/2013] aminocaproic acid (AMICAR) for OHS   Intravenous To OR  . aspirin EC  325 mg Oral Daily  . atorvastatin  40 mg Oral q1800  . bisacodyl  5 mg Oral Once  . [START ON 04/09/2013] cefUROXime (ZINACEF)  IV  750 mg Intravenous To OR  . chlorhexidine  60 mL Topical Once  . [START ON 04/09/2013] chlorhexidine  60 mL Topical Once  . [START ON 04/09/2013] dexmedetomidine  0.1-0.7 mcg/kg/hr Intravenous To OR  . [START ON 04/09/2013] diazepam  5 mg Oral Once  . [START ON 04/09/2013] DOPamine  2-20 mcg/kg/min Intravenous To OR  . [START ON 04/09/2013] epinephrine  0.5-20 mcg/min Intravenous To OR  . [START ON 04/09/2013] heparin-papaverine-plasmalyte irrigation   Irrigation To OR  . [START ON 04/09/2013] heparin 30,000  units/NS 1000 mL solution for CELLSAVER   Other To OR  . hydrochlorothiazide  12.5 mg Oral Daily  . [START ON 04/09/2013] insulin (NOVOLIN-R) infusion   Intravenous To OR  . [START ON 04/09/2013] magnesium sulfate  40 mEq Other To OR  . metoprolol tartrate  12.5 mg Oral BID  . [START ON 04/09/2013] metoprolol tartrate  12.5 mg Oral Once  . multivitamin with minerals  1 tablet Oral Daily  . [START ON 04/09/2013] nitroGLYCERIN  2-200 mcg/min Intravenous To OR  . [START ON 04/09/2013] phenylephrine (NEO-SYNEPHRINE) Adult infusion  30-200 mcg/min Intravenous To OR  . [START ON 04/09/2013] potassium chloride  80 mEq Other To OR  . sodium chloride  3 mL Intravenous Q12H      LOS: 3 days   RAI,RIPUDEEP M.D. [facility] 04/08/2013, 1:47 PM Pager: [phone]  If 7PM-7AM, please contact night-coverage [email or web address] Password [email or web address]

## 2013-04-09 ENCOUNTER — Encounter (HOSPITAL_COMMUNITY): Payer: Self-pay | Admitting: Certified Registered"

## 2013-04-09 ENCOUNTER — Inpatient Hospital Stay (HOSPITAL_COMMUNITY): Payer: Medicare Other | Admitting: Certified Registered"

## 2013-04-09 ENCOUNTER — Inpatient Hospital Stay (HOSPITAL_COMMUNITY): Payer: Medicare Other

## 2013-04-09 ENCOUNTER — Encounter (HOSPITAL_COMMUNITY)
Admission: EM | Disposition: A | Discharge status: Home / Self Care | Payer: Medicare Other | Source: Home / Self Care | Attending: Thoracic Surgery (Cardiothoracic Vascular Surgery)

## 2013-04-09 DIAGNOSIS — D151 Benign neoplasm of heart: Secondary | ICD-10-CM

## 2013-04-09 DIAGNOSIS — Q2111 Secundum atrial septal defect: Secondary | ICD-10-CM

## 2013-04-09 DIAGNOSIS — Q211 Atrial septal defect: Secondary | ICD-10-CM

## 2013-04-09 HISTORY — PX: EXCISION OF ATRIAL MYXOMA: SHX5821

## 2013-04-09 HISTORY — PX: INTRAOPERATIVE TRANSESOPHAGEAL ECHOCARDIOGRAM: SHX5062

## 2013-04-09 LAB — POCT I-STAT 3, ART BLOOD GAS (G3+)
Acid-Base Excess: 2 mmol/L (ref 0.0–2.0)
Acid-base deficit: 2 mmol/L (ref 0.0–2.0)
Acid-base deficit: 3 mmol/L — ABNORMAL HIGH (ref 0.0–2.0)
Acid-base deficit: 3 mmol/L — ABNORMAL HIGH (ref 0.0–2.0)
Bicarbonate: 22.8 mEq/L (ref 20.0–24.0)
Bicarbonate: 23.4 mEq/L (ref 20.0–24.0)
Bicarbonate: 23.1 mEq/L (ref 20.0–24.0)
O2 Saturation: 100 %
O2 Saturation: 97 %
Patient temperature: 37.1
Patient temperature: 38.3
TCO2: 28 mmol/L (ref 0–100)
TCO2: 25 mmol/L (ref 0–100)
pCO2 arterial: 46.6 mmHg — ABNORMAL HIGH (ref 35.0–45.0)
pCO2 arterial: 45.2 mmHg — ABNORMAL HIGH (ref 35.0–45.0)
pH, Arterial: 7.431 (ref 7.350–7.450)
pO2, Arterial: 102 mmHg — ABNORMAL HIGH (ref 80.0–100.0)

## 2013-04-09 LAB — BASIC METABOLIC PANEL
BUN: 12 mg/dL (ref 6–23)
CO2: 26 mEq/L (ref 19–32)
Calcium: 9 mg/dL (ref 8.4–10.5)
Chloride: 107 mEq/L (ref 96–112)
Creatinine, Ser: 0.87 mg/dL (ref 0.50–1.35)
GFR calc Af Amer: >90 mL/min (ref >90)
Glucose, Bld: 106 mg/dL — ABNORMAL HIGH (ref 70–99)

## 2013-04-09 LAB — CBC
HCT: 38.1 % — ABNORMAL LOW (ref 39.0–52.0)
HCT: 35.2 % — ABNORMAL LOW (ref 39.0–52.0)
Hemoglobin: 13.1 g/dL (ref 13.0–17.0)
Hemoglobin: 13.3 g/dL (ref 13.0–17.0)
MCH: 30.9 pg (ref 26.0–34.0)
MCH: 30.1 pg (ref 26.0–34.0)
MCH: 30.9 pg (ref 26.0–34.0)
MCHC: 33.7 g/dL (ref 30.0–36.0)
MCHC: 34.4 g/dL (ref 30.0–36.0)
MCV: 89.9 fL (ref 78.0–100.0)
MCV: 89.4 fL (ref 78.0–100.0)
Platelets: 153 10*3/uL (ref 150–400)
Platelets: 95 10*3/uL — ABNORMAL LOW (ref 150–400)
Platelets: 101 10*3/uL — ABNORMAL LOW (ref 150–400)
RBC: 4.42 MIL/uL (ref 4.22–5.81)
RDW: 13.8 % (ref 11.5–15.5)
RDW: 13.8 % (ref 11.5–15.5)
RDW: 14 % (ref 11.5–15.5)
WBC: 11.5 10*3/uL — ABNORMAL HIGH (ref 4.0–10.5)

## 2013-04-09 LAB — POCT I-STAT 4, (NA,K, GLUC, HGB,HCT)
Glucose, Bld: 114 mg/dL — ABNORMAL HIGH (ref 70–99)
Glucose, Bld: 105 mg/dL — ABNORMAL HIGH (ref 70–99)
Glucose, Bld: 155 mg/dL — ABNORMAL HIGH (ref 70–99)
HCT: 36 % — ABNORMAL LOW (ref 39.0–52.0)
HCT: 27 % — ABNORMAL LOW (ref 39.0–52.0)
HCT: 28 % — ABNORMAL LOW (ref 39.0–52.0)
HCT: 26 % — ABNORMAL LOW (ref 39.0–52.0)
HCT: 28 % — ABNORMAL LOW (ref 39.0–52.0)
HCT: 40 % (ref 39.0–52.0)
Hemoglobin: 12.2 g/dL — ABNORMAL LOW (ref 13.0–17.0)
Hemoglobin: 9.2 g/dL — ABNORMAL LOW (ref 13.0–17.0)
Hemoglobin: 8.8 g/dL — ABNORMAL LOW (ref 13.0–17.0)
Hemoglobin: 9.5 g/dL — ABNORMAL LOW (ref 13.0–17.0)
Hemoglobin: 13.6 g/dL (ref 13.0–17.0)
Potassium: 3.8 mEq/L (ref 3.5–5.1)
Potassium: 3.9 mEq/L (ref 3.5–5.1)
Potassium: 5.1 mEq/L (ref 3.5–5.1)
Potassium: 5.4 mEq/L — ABNORMAL HIGH (ref 3.5–5.1)
Potassium: 4.2 mEq/L (ref 3.5–5.1)
Sodium: 136 mEq/L (ref 135–145)
Sodium: 138 mEq/L (ref 135–145)

## 2013-04-09 LAB — POCT I-STAT, CHEM 8
Calcium, Ion: 1.13 mmol/L (ref 1.13–1.30)
HCT: 33 % — ABNORMAL LOW (ref 39.0–52.0)
TCO2: 23 mmol/L (ref 0–100)

## 2013-04-09 LAB — CREATININE, SERUM: Creatinine, Ser: 0.87 mg/dL (ref 0.50–1.35)

## 2013-04-09 LAB — PLATELET COUNT: Platelets: 102 10*3/uL — ABNORMAL LOW (ref 150–400)

## 2013-04-09 LAB — HEMOGLOBIN A1C: Mean Plasma Glucose: 114 mg/dL (ref <117)

## 2013-04-09 LAB — MAGNESIUM: Magnesium: 3.1 mg/dL — ABNORMAL HIGH (ref 1.5–2.5)

## 2013-04-09 LAB — PROTIME-INR
INR: 1.31 (ref 0.00–1.49)
Prothrombin Time: 16 seconds — ABNORMAL HIGH (ref 11.6–15.2)

## 2013-04-09 LAB — GLUCOSE, CAPILLARY
Glucose-Capillary: 64 mg/dL — ABNORMAL LOW (ref 70–99)
Glucose-Capillary: 80 mg/dL (ref 70–99)

## 2013-04-09 LAB — HEMOGLOBIN AND HEMATOCRIT, BLOOD: HCT: 28.4 % — ABNORMAL LOW (ref 39.0–52.0)

## 2013-04-09 SURGERY — EXCISION, MYXOMA, CARDIAC ATRIUM
Anesthesia: General | Site: Chest

## 2013-04-09 MED ORDER — MORPHINE SULFATE 2 MG/ML IJ SOLN
2.0000 mg | INTRAMUSCULAR | Status: DC | PRN
  Administered 2013-04-09 – 2013-04-11 (×4): 2 mg via INTRAVENOUS
  Filled 2013-04-09 (×4): qty 1

## 2013-04-09 MED ORDER — SODIUM CHLORIDE 0.9 % IV SOLN: 250.0000 mL | INTRAVENOUS | Status: DC

## 2013-04-09 MED ORDER — METOPROLOL TARTRATE 12.5 MG HALF TABLET
12.5000 mg | ORAL_TABLET | Freq: Two times a day (BID) | ORAL | Status: DC
  Filled 2013-04-09 (×3): qty 1

## 2013-04-09 MED ORDER — METOCLOPRAMIDE HCL 5 MG/ML IJ SOLN
10.0000 mg | Freq: Four times a day (QID) | INTRAMUSCULAR | Status: AC
Start: 2013-04-09 — End: 2013-04-10
  Administered 2013-04-09 – 2013-04-10 (×4): 10 mg via INTRAVENOUS
  Filled 2013-04-09 (×4): qty 2

## 2013-04-09 MED ORDER — ONDANSETRON HCL 4 MG/2ML IJ SOLN: 4.0000 mg | Freq: Four times a day (QID) | INTRAMUSCULAR | Status: DC | PRN

## 2013-04-09 MED ORDER — INSULIN REGULAR BOLUS VIA INFUSION
0.0000 [IU] | Freq: Three times a day (TID) | INTRAVENOUS | Status: DC
  Filled 2013-04-09: qty 10

## 2013-04-09 MED ORDER — DEXMEDETOMIDINE HCL IN NACL 200 MCG/50ML IV SOLN
0.1000 ug/kg/h | INTRAVENOUS | Status: DC
  Administered 2013-04-09: 0.5 ug/kg/h via INTRAVENOUS
  Filled 2013-04-09: qty 50

## 2013-04-09 MED ORDER — LACTATED RINGERS IV SOLN
INTRAVENOUS | Status: DC | PRN
  Administered 2013-04-09: 08:00:00 via INTRAVENOUS

## 2013-04-09 MED ORDER — MIDAZOLAM HCL 2 MG/2ML IJ SOLN: 2.0000 mg | INTRAMUSCULAR | Status: DC | PRN

## 2013-04-09 MED ORDER — DOCUSATE SODIUM 100 MG PO CAPS
200.0000 mg | ORAL_CAPSULE | Freq: Every day | ORAL | Status: DC
  Administered 2013-04-10 – 2013-04-13 (×4): 200 mg via ORAL
  Filled 2013-04-09 (×4): qty 2

## 2013-04-09 MED ORDER — GLYCOPYRROLATE 0.2 MG/ML IJ SOLN
INTRAMUSCULAR | Status: DC | PRN
  Administered 2013-04-09: 0.2 mg via INTRAVENOUS

## 2013-04-09 MED ORDER — LACTATED RINGERS IV SOLN
INTRAVENOUS | Status: DC
  Administered 2013-04-09: 14:00:00 via INTRAVENOUS

## 2013-04-09 MED ORDER — HEPARIN SODIUM (PORCINE) 1000 UNIT/ML IJ SOLN
INTRAMUSCULAR | Status: DC | PRN
  Administered 2013-04-09: 38000 [IU] via INTRAVENOUS

## 2013-04-09 MED ORDER — NITROGLYCERIN IN D5W 200-5 MCG/ML-% IV SOLN: 0.0000 ug/min | INTRAVENOUS | Status: DC

## 2013-04-09 MED ORDER — PROPOFOL 10 MG/ML IV BOLUS
INTRAVENOUS | Status: DC | PRN
  Administered 2013-04-09: 80 mg via INTRAVENOUS

## 2013-04-09 MED ORDER — INSULIN ASPART 100 UNIT/ML ~~LOC~~ SOLN
0.0000 [IU] | SUBCUTANEOUS | Status: DC
  Administered 2013-04-09 – 2013-04-10 (×6): 2 [IU] via SUBCUTANEOUS

## 2013-04-09 MED ORDER — METOPROLOL TARTRATE 1 MG/ML IV SOLN: 2.5000 mg | INTRAVENOUS | Status: DC | PRN

## 2013-04-09 MED ORDER — PHENYLEPHRINE HCL 10 MG/ML IJ SOLN
0.0000 ug/min | INTRAVENOUS | Status: DC
  Administered 2013-04-09: 35 ug/min via INTRAVENOUS
  Filled 2013-04-09 (×3): qty 2

## 2013-04-09 MED ORDER — PROTAMINE SULFATE 10 MG/ML IV SOLN
INTRAVENOUS | Status: DC | PRN
  Administered 2013-04-09 (×2): 150 mg via INTRAVENOUS

## 2013-04-09 MED ORDER — DOPAMINE-DEXTROSE 3.2-5 MG/ML-% IV SOLN
INTRAVENOUS | Status: DC | PRN
  Administered 2013-04-09: 3 ug/kg/min via INTRAVENOUS

## 2013-04-09 MED ORDER — DEXTROSE 5 % IV SOLN
1.5000 g | Freq: Two times a day (BID) | INTRAVENOUS | Status: AC
  Administered 2013-04-09 – 2013-04-11 (×4): 1.5 g via INTRAVENOUS
  Filled 2013-04-09 (×4): qty 1.5

## 2013-04-09 MED ORDER — SODIUM CHLORIDE 0.9 % IJ SOLN
OROMUCOSAL | Status: DC | PRN
  Administered 2013-04-09 (×3): via TOPICAL

## 2013-04-09 MED ORDER — SUFENTANIL CITRATE 50 MCG/ML IV SOLN
INTRAVENOUS | Status: DC | PRN
  Administered 2013-04-09 (×2): 25 ug via INTRAVENOUS
  Administered 2013-04-09: 15 ug via INTRAVENOUS
  Administered 2013-04-09: 50 ug via INTRAVENOUS
  Administered 2013-04-09: 10 ug via INTRAVENOUS
  Administered 2013-04-09: 50 ug via INTRAVENOUS
  Administered 2013-04-09: 25 ug via INTRAVENOUS

## 2013-04-09 MED ORDER — VECURONIUM BROMIDE 10 MG IV SOLR
INTRAVENOUS | Status: DC | PRN
  Administered 2013-04-09: 10 mg via INTRAVENOUS
  Administered 2013-04-09 (×5): 2 mg via INTRAVENOUS

## 2013-04-09 MED ORDER — ACETAMINOPHEN 160 MG/5ML PO SOLN
1000.0000 mg | Freq: Four times a day (QID) | ORAL | Status: DC
  Filled 2013-04-09: qty 40

## 2013-04-09 MED ORDER — SODIUM CHLORIDE 0.9 % IJ SOLN: 3.0000 mL | INTRAMUSCULAR | Status: DC | PRN

## 2013-04-09 MED ORDER — BISACODYL 10 MG RE SUPP: 10.0000 mg | Freq: Every day | RECTAL | Status: DC

## 2013-04-09 MED ORDER — LACTATED RINGERS IV SOLN: 500.0000 mL | Freq: Once | INTRAVENOUS | Status: AC | PRN

## 2013-04-09 MED ORDER — MORPHINE SULFATE 2 MG/ML IJ SOLN: 1.0000 mg | INTRAMUSCULAR | Status: AC | PRN

## 2013-04-09 MED ORDER — ACETAMINOPHEN 650 MG RE SUPP
650.0000 mg | Freq: Once | RECTAL | Status: AC
  Administered 2013-04-09: 650 mg via RECTAL

## 2013-04-09 MED ORDER — ALBUMIN HUMAN 5 % IV SOLN
250.0000 mL | INTRAVENOUS | Status: AC | PRN
  Administered 2013-04-09 (×3): 250 mL via INTRAVENOUS
  Filled 2013-04-09: qty 250

## 2013-04-09 MED ORDER — SODIUM CHLORIDE 0.9 % IV SOLN
INTRAVENOUS | Status: DC
  Filled 2013-04-09: qty 1

## 2013-04-09 MED ORDER — MIDAZOLAM HCL 5 MG/5ML IJ SOLN
INTRAMUSCULAR | Status: DC | PRN
  Administered 2013-04-09 (×4): 2 mg via INTRAVENOUS

## 2013-04-09 MED ORDER — SODIUM CHLORIDE 0.45 % IV SOLN
INTRAVENOUS | Status: DC
  Administered 2013-04-09: 15:00:00 via INTRAVENOUS

## 2013-04-09 MED ORDER — SODIUM CHLORIDE 0.9 % IV SOLN: INTRAVENOUS | Status: DC

## 2013-04-09 MED ORDER — DOPAMINE-DEXTROSE 3.2-5 MG/ML-% IV SOLN
0.0000 ug/kg/min | INTRAVENOUS | Status: DC
Start: 2013-04-09 — End: 2013-04-11

## 2013-04-09 MED ORDER — POTASSIUM CHLORIDE 10 MEQ/50ML IV SOLN: 10.0000 meq | INTRAVENOUS | Status: AC

## 2013-04-09 MED ORDER — PANTOPRAZOLE SODIUM 40 MG PO TBEC
40.0000 mg | DELAYED_RELEASE_TABLET | Freq: Every day | ORAL | Status: DC
  Administered 2013-04-11 – 2013-04-13 (×3): 40 mg via ORAL
  Filled 2013-04-09 (×3): qty 1

## 2013-04-09 MED ORDER — ASPIRIN 81 MG PO CHEW
324.0000 mg | CHEWABLE_TABLET | Freq: Every day | ORAL | Status: DC
  Administered 2013-04-10: 324 mg
  Filled 2013-04-09: qty 4

## 2013-04-09 MED ORDER — BISACODYL 5 MG PO TBEC
10.0000 mg | DELAYED_RELEASE_TABLET | Freq: Every day | ORAL | Status: DC
  Administered 2013-04-11: 10 mg via ORAL
  Filled 2013-04-09: qty 2

## 2013-04-09 MED ORDER — ACETAMINOPHEN 500 MG PO TABS
1000.0000 mg | ORAL_TABLET | Freq: Four times a day (QID) | ORAL | Status: DC
  Administered 2013-04-10 – 2013-04-13 (×14): 1000 mg via ORAL
  Filled 2013-04-09 (×17): qty 2

## 2013-04-09 MED ORDER — MAGNESIUM SULFATE 40 MG/ML IJ SOLN
4.0000 g | Freq: Once | INTRAMUSCULAR | Status: AC
  Administered 2013-04-09: 4 g via INTRAVENOUS
  Filled 2013-04-09: qty 100

## 2013-04-09 MED ORDER — HEMOSTATIC AGENTS (NO CHARGE) OPTIME
TOPICAL | Status: DC | PRN
  Administered 2013-04-09: 1 via TOPICAL

## 2013-04-09 MED ORDER — SODIUM BICARBONATE 8.4 % IV SOLN
50.0000 meq | Freq: Once | INTRAVENOUS | Status: AC
  Administered 2013-04-09: 50 meq via INTRAVENOUS

## 2013-04-09 MED ORDER — FAMOTIDINE IN NACL 20-0.9 MG/50ML-% IV SOLN
20.0000 mg | Freq: Two times a day (BID) | INTRAVENOUS | Status: AC
  Administered 2013-04-09: 20 mg via INTRAVENOUS

## 2013-04-09 MED ORDER — OXYCODONE HCL 5 MG PO TABS: 5.0000 mg | ORAL_TABLET | ORAL | Status: DC | PRN

## 2013-04-09 MED ORDER — SODIUM CHLORIDE 0.9 % IJ SOLN
3.0000 mL | Freq: Two times a day (BID) | INTRAMUSCULAR | Status: DC
  Administered 2013-04-10 – 2013-04-11 (×3): 3 mL via INTRAVENOUS

## 2013-04-09 MED ORDER — METOPROLOL TARTRATE 25 MG/10 ML ORAL SUSPENSION
12.5000 mg | Freq: Two times a day (BID) | ORAL | Status: DC
  Filled 2013-04-09 (×3): qty 5

## 2013-04-09 MED ORDER — ASPIRIN EC 325 MG PO TBEC
325.0000 mg | DELAYED_RELEASE_TABLET | Freq: Every day | ORAL | Status: DC
  Administered 2013-04-11 – 2013-04-13 (×3): 325 mg via ORAL
  Filled 2013-04-09 (×4): qty 1

## 2013-04-09 MED ORDER — VANCOMYCIN HCL IN DEXTROSE 1-5 GM/200ML-% IV SOLN
1000.0000 mg | Freq: Once | INTRAVENOUS | Status: AC
  Administered 2013-04-09: 1000 mg via INTRAVENOUS
  Filled 2013-04-09: qty 200

## 2013-04-09 MED ORDER — ACETAMINOPHEN 160 MG/5ML PO SOLN: 650.0000 mg | Freq: Once | ORAL | Status: AC

## 2013-04-09 MED ORDER — ARTIFICIAL TEARS OP OINT
TOPICAL_OINTMENT | OPHTHALMIC | Status: DC | PRN
  Administered 2013-04-09: 1 via OPHTHALMIC

## 2013-04-09 SURGICAL SUPPLY — 102 items
ADAPTER CARDIO PERF ANTE/RETRO (ADAPTER) ×6 IMPLANT
ANTEGRADE CPLG (MISCELLANEOUS) ×3 IMPLANT
BAG DECANTER FOR FLEXI CONT (MISCELLANEOUS) ×3 IMPLANT
BLADE STERNUM SYSTEM 6 (BLADE) ×3 IMPLANT
BLADE SURG 11 STRL SS (BLADE) ×3 IMPLANT
BLADE SURG 15 STRL LF DISP TIS (BLADE) ×4 IMPLANT
BLADE SURG 15 STRL SS (BLADE) ×2
CANISTER SUCTION 2500CC (MISCELLANEOUS) ×3 IMPLANT
CANN PRFSN 3/8XRT ANG TPR 14 (MISCELLANEOUS) ×2
CANNULA ARTERIAL NVNT 3/8 22FR (MISCELLANEOUS) ×3 IMPLANT
CANNULA EZ GLIDE AORTIC 21FR (CANNULA) ×3 IMPLANT
CANNULA GUNDRY RCSP 15FR (MISCELLANEOUS) ×6 IMPLANT
CANNULA PRFSN 3/8XRT ANG TPR14 (MISCELLANEOUS) ×2 IMPLANT
CANNULA VEN 1 STAGE STR 66236 (MISCELLANEOUS) IMPLANT
CANNULA VEN MTL TIP RT (MISCELLANEOUS) ×1
CANNULA VRC MALB SNGL STG 36FR (MISCELLANEOUS) ×2 IMPLANT
CATH ROBINSON RED A/P 18FR (CATHETERS) ×12 IMPLANT
CATH THORACIC 36FR (CATHETERS) ×3 IMPLANT
CATH THORACIC 36FR RT ANG (CATHETERS) ×3 IMPLANT
CLIP FOGARTY SPRING 6M (CLIP) IMPLANT
CONN 1/2X1/2X1/2  BEN (MISCELLANEOUS) ×1
CONN 1/2X1/2X1/2 BEN (MISCELLANEOUS) ×2 IMPLANT
CONN 3/8X1/2 ST GISH (MISCELLANEOUS) ×6 IMPLANT
CONN ST 1/2X1/2  BEN (MISCELLANEOUS) ×1
CONN ST 1/2X1/2 BEN (MISCELLANEOUS) ×2 IMPLANT
CONN Y 3/8X3/8X3/8  BEN (MISCELLANEOUS)
CONN Y 3/8X3/8X3/8 BEN (MISCELLANEOUS) IMPLANT
CONT SPEC 4OZ CLIKSEAL STRL BL (MISCELLANEOUS) ×6 IMPLANT
COVER SURGICAL LIGHT HANDLE (MISCELLANEOUS) ×3 IMPLANT
CRADLE DONUT ADULT HEAD (MISCELLANEOUS) ×3 IMPLANT
DRAPE CARDIOVASCULAR INCISE (DRAPES) ×1
DRAPE SLUSH/WARMER DISC (DRAPES) ×3 IMPLANT
DRAPE SRG 135X102X78XABS (DRAPES) ×2 IMPLANT
DRSG COVADERM 4X14 (GAUZE/BANDAGES/DRESSINGS) ×3 IMPLANT
ELECT CAUTERY BLADE 6.4 (BLADE) IMPLANT
ELECT REM PT RETURN 9FT ADLT (ELECTROSURGICAL) ×6
ELECTRODE REM PT RTRN 9FT ADLT (ELECTROSURGICAL) ×4 IMPLANT
GLOVE BIO SURGEON STRL SZ 6 (GLOVE) ×18 IMPLANT
GLOVE BIO SURGEON STRL SZ 6.5 (GLOVE) ×12 IMPLANT
GLOVE BIO SURGEON STRL SZ7 (GLOVE) ×12 IMPLANT
GLOVE EUDERMIC 7 POWDERFREE (GLOVE) ×12 IMPLANT
GOWN PREVENTION PLUS XLARGE (GOWN DISPOSABLE) ×3 IMPLANT
GOWN STRL NON-REIN LRG LVL3 (GOWN DISPOSABLE) ×30 IMPLANT
HEMOSTAT POWDER SURGIFOAM 1G (HEMOSTASIS) IMPLANT
HEMOSTAT SURGICEL 2X14 (HEMOSTASIS) ×6 IMPLANT
INSERT FOGARTY XLG (MISCELLANEOUS) IMPLANT
KIT BASIN OR (CUSTOM PROCEDURE TRAY) ×3 IMPLANT
KIT ROOM TURNOVER OR (KITS) ×3 IMPLANT
KIT SUCTION CATH 14FR (SUCTIONS) ×6 IMPLANT
LEAD PACING MYOCARDI (MISCELLANEOUS) ×3 IMPLANT
LINE VENT (MISCELLANEOUS) ×3 IMPLANT
LOOP VESSEL SUPERMAXI WHITE (MISCELLANEOUS) ×3 IMPLANT
NS IRRIG 1000ML POUR BTL (IV SOLUTION) ×12 IMPLANT
PACK OPEN HEART (CUSTOM PROCEDURE TRAY) IMPLANT
PAD ARMBOARD 7.5X6 YLW CONV (MISCELLANEOUS) ×6 IMPLANT
PATCH CORMATRIX 4CMX7CM (Prosthesis & Implant Heart) ×3 IMPLANT
SET CARDIOPLEGIA MPS 5001102 (MISCELLANEOUS) ×3 IMPLANT
SOLUTION ANTI FOG 6CC (MISCELLANEOUS) ×3 IMPLANT
SPONGE GAUZE 4X4 12PLY (GAUZE/BANDAGES/DRESSINGS) ×3 IMPLANT
SUCKER INTRACARDIAC WEIGHTED (SUCKER) ×3 IMPLANT
SUCKER WEIGHTED FLEX (MISCELLANEOUS) ×3 IMPLANT
SUT BONE WAX W31G (SUTURE) ×3 IMPLANT
SUT ETHIBOND 2 0 SH (SUTURE) ×6 IMPLANT
SUT ETHIBOND 2 0 SH 36X2 (SUTURE) ×6 IMPLANT
SUT ETHIBOND 2 0 V4 (SUTURE) IMPLANT
SUT ETHIBOND 2 0V4 GREEN (SUTURE) IMPLANT
SUT PROLENE 3 0 SH 1 (SUTURE) ×9 IMPLANT
SUT PROLENE 3 0 SH DA (SUTURE) ×3 IMPLANT
SUT PROLENE 3 0 SH1 36 (SUTURE) ×3 IMPLANT
SUT PROLENE 4 0 RB 1 (SUTURE) ×10
SUT PROLENE 4 0 SH DA (SUTURE) ×27 IMPLANT
SUT PROLENE 4-0 RB1 .5 CRCL 36 (SUTURE) ×20 IMPLANT
SUT PROLENE 5 0 C 1 36 (SUTURE) ×6 IMPLANT
SUT PROLENE 5 0 CC1 (SUTURE) ×3 IMPLANT
SUT PROLENE 6 0 C 1 30 (SUTURE) ×6 IMPLANT
SUT SILK  1 MH (SUTURE) ×2
SUT SILK 1 MH (SUTURE) ×4 IMPLANT
SUT SILK 1 TIES 10X30 (SUTURE) ×3 IMPLANT
SUT SILK 2 0 (SUTURE) ×1
SUT SILK 2 0 SH CR/8 (SUTURE) ×6 IMPLANT
SUT SILK 2-0 18XBRD TIE 12 (SUTURE) ×2 IMPLANT
SUT SILK 3 0 SH CR/8 (SUTURE) ×3 IMPLANT
SUT SILK 4 0 (SUTURE) ×1
SUT SILK 4-0 18XBRD TIE 12 (SUTURE) ×2 IMPLANT
SUT STEEL 6MS V (SUTURE) ×3 IMPLANT
SUT STEEL SZ 6 DBL 3X14 BALL (SUTURE) ×3 IMPLANT
SUT TEM PAC WIRE 2 0 SH (SUTURE) ×12 IMPLANT
SUT VIC AB 1 CTX 27 (SUTURE) ×6 IMPLANT
SUT VIC AB 1 CTX 36 (SUTURE)
SUT VIC AB 1 CTX36XBRD ANBCTR (SUTURE) IMPLANT
SUT VIC AB 2-0 CTX 27 (SUTURE) IMPLANT
SUT VIC AB 3-0 X1 27 (SUTURE) IMPLANT
SYSTEM SAHARA CHEST DRAIN ATS (WOUND CARE) ×3 IMPLANT
TAPE CLOTH SURG 4X10 WHT LF (GAUZE/BANDAGES/DRESSINGS) ×3 IMPLANT
TOWEL OR 17X24 6PK STRL BLUE (TOWEL DISPOSABLE) ×3 IMPLANT
TOWEL OR 17X26 10 PK STRL BLUE (TOWEL DISPOSABLE) ×3 IMPLANT
TRAY FOLEY IC TEMP SENS 14FR (CATHETERS) ×3 IMPLANT
TUBE FEEDING 8FR 16IN STR KANG (MISCELLANEOUS) ×3 IMPLANT
TUBE SUCT INTRACARD DLP 20F (MISCELLANEOUS) ×6 IMPLANT
UNDERPAD 30X30 INCONTINENT (UNDERPADS AND DIAPERS) ×3 IMPLANT
VRC MALLEABLE SINGLE STG 36FR (MISCELLANEOUS) ×3
WATER STERILE IRR 1000ML POUR (IV SOLUTION) ×9 IMPLANT

## 2013-04-09 NOTE — H&P (View-Only) (Signed)
Reason for Consult: left atrial myxoma Referring Physician: Dr. Janne Lab Matthew Sullivan is an 67 y.o. male.  HPI: 67 yo male presents with cc/o dizziness.  67 yo male with PMH of TIA. He was walking his dog about a month ago when he felt extremely dizzy and almost fell. On the day of admission he was doing yard work. He felt dizzy and stopped what he was doing. His light headedness did not resolve with rest. He had a sense that something was very wrong and asked his wife to take him to the fire station. He tried to stand up but had to sit right back down. He began sweating profusely.   He was admitted and ruled in for MI. 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  Past Medical History  Diagnosis Date  . Hypertension   . Hyperlipidemia   . History of tinnitus   . Cervical osteoarthritis   . MI, old 60  . History of embolic stroke   . Stroke   . Melanoma     Past Surgical History  Procedure Laterality Date  . Back surgery    . Appendectomy    . Nose surgery    . Nasal septum surgery      Family History  Problem Relation Age of Onset  . Cancer Father     Social History:  reports that he quit smoking about 35 years ago. He does not have any smokeless tobacco history on file. He reports that he does not drink alcohol or use illicit drugs.  Allergies: No Known Allergies  Medications:  Prior to Admission:  Prescriptions prior to admission  Medication Sig Dispense Refill  . aspirin EC 81 MG tablet Take 81 mg by mouth every evening.      Marland Kitchen atorvastatin (LIPITOR) 80 MG tablet Take 40 mg by mouth every evening.      . celecoxib (CELEBREX) 200 MG capsule Take 200 mg by mouth daily as needed for pain.      . hydrochlorothiazide (MICROZIDE) 12.5 MG capsule Take 12.5 mg by mouth every evening.        Results for orders placed during the hospital encounter of 04/05/13 (from the past 48 hour(s))   URINE RAPID DRUG SCREEN (HOSP PERFORMED)     Status: None   Collection Time    04/05/13  5:31 PM      Result Value Range   Opiates NONE DETECTED  NONE DETECTED   Cocaine NONE DETECTED  NONE DETECTED   Benzodiazepines NONE DETECTED  NONE DETECTED   Amphetamines NONE DETECTED  NONE DETECTED   Tetrahydrocannabinol NONE DETECTED  NONE DETECTED   Barbiturates NONE DETECTED  NONE DETECTED   Comment:            DRUG SCREEN FOR MEDICAL PURPOSES     ONLY.  IF CONFIRMATION IS NEEDED     FOR ANY PURPOSE, NOTIFY LAB     WITHIN 5 DAYS.                LOWEST DETECTABLE LIMITS     FOR URINE DRUG SCREEN     Drug Class       Cutoff (ng/mL)     Amphetamine      1000     Barbiturate      200     Benzodiazepine   200     Tricyclics       300  Opiates          300     Cocaine          300     THC              50  URINALYSIS, ROUTINE W REFLEX MICROSCOPIC     Status: None   Collection Time    04/05/13  5:31 PM      Result Value Range   Color, Urine YELLOW  YELLOW   APPearance CLEAR  CLEAR   Specific Gravity, Urine 1.013  1.005 - 1.030   pH 6.0  5.0 - 8.0   Glucose, UA NEGATIVE  NEGATIVE mg/dL   Hgb urine dipstick NEGATIVE  NEGATIVE   Bilirubin Urine NEGATIVE  NEGATIVE   Ketones, ur NEGATIVE  NEGATIVE mg/dL   Protein, ur NEGATIVE  NEGATIVE mg/dL   Urobilinogen, UA 0.2  0.0 - 1.0 mg/dL   Nitrite NEGATIVE  NEGATIVE   Leukocytes, UA NEGATIVE  NEGATIVE   Comment: MICROSCOPIC NOT DONE ON URINES WITH NEGATIVE PROTEIN, BLOOD, LEUKOCYTES, NITRITE, OR GLUCOSE <1000 mg/dL.  TROPONIN I     Status: Abnormal   Collection Time    04/05/13  7:03 PM      Result Value Range   Troponin I 1.90 (*) <0.30 ng/mL   Comment:            Due to the release kinetics of cTnI,     a negative result within the first hours     of the onset of symptoms does not rule out     myocardial infarction with certainty.     If myocardial infarction is still suspected,     repeat the test at appropriate intervals.      REPEATED TO VERIFY     CRITICAL RESULT CALLED TO, READ BACK BY AND VERIFIED WITH:     D.DUVALL,RN 2030 04/05/13 M.CAMPBELL  TROPONIN I     Status: Abnormal   Collection Time    04/06/13 12:40 AM      Result Value Range   Troponin I 8.00 (*) <0.30 ng/mL   Comment:            Due to the release kinetics of cTnI,     a negative result within the first hours     of the onset of symptoms does not rule out     myocardial infarction with certainty.     If myocardial infarction is still suspected,     repeat the test at appropriate intervals.     CRITICAL VALUE NOTED.  VALUE IS CONSISTENT WITH PREVIOUSLY REPORTED AND CALLED VALUE.     REPEATED TO VERIFY  CBC     Status: Abnormal   Collection Time    04/06/13  7:00 AM      Result Value Range   WBC 7.2  4.0 - 10.5 K/uL   RBC 4.26  4.22 - 5.81 MIL/uL   Hemoglobin 13.3  13.0 - 17.0 g/dL   HCT 16.1 (*) 09.6 - 04.5 %   MCV 89.0  78.0 - 100.0 fL   MCH 31.2  26.0 - 34.0 pg   MCHC 35.1  30.0 - 36.0 g/dL   RDW 40.9  81.1 - 91.4 %   Platelets 156  150 - 400 K/uL  BASIC METABOLIC PANEL     Status: Abnormal   Collection Time    04/06/13  7:00 AM      Result Value Range   Sodium 139  135 - 145 mEq/L   Potassium 3.4 (*) 3.5 - 5.1 mEq/L   Chloride 103  96 - 112 mEq/L   CO2 27  19 - 32 mEq/L   Glucose, Bld 110 (*) 70 - 99 mg/dL   BUN 16  6 - 23 mg/dL   Creatinine, Ser 1.61  0.50 - 1.35 mg/dL   Calcium 8.9  8.4 - 09.6 mg/dL   GFR calc non Af Amer 85 (*) >90 mL/min   GFR calc Af Amer >90  >90 mL/min   Comment: (NOTE)     The eGFR has been calculated using the CKD EPI equation.     This calculation has not been validated in all clinical situations.     eGFR's persistently <90 mL/min signify possible Chronic Kidney     Disease.  TROPONIN I     Status: Abnormal   Collection Time    04/06/13  7:00 AM      Result Value Range   Troponin I 5.91 (*) <0.30 ng/mL   Comment:            Due to the release kinetics of cTnI,     a negative result  within the first hours     of the onset of symptoms does not rule out     myocardial infarction with certainty.     If myocardial infarction is still suspected,     repeat the test at appropriate intervals.     REPEATED TO VERIFY     CRITICAL VALUE NOTED.  VALUE IS CONSISTENT WITH PREVIOUSLY REPORTED AND CALLED VALUE.  TROPONIN I     Status: Abnormal   Collection Time    04/06/13  8:05 AM      Result Value Range   Troponin I 6.38 (*) <0.30 ng/mL   Comment:            Due to the release kinetics of cTnI,     a negative result within the first hours     of the onset of symptoms does not rule out     myocardial infarction with certainty.     If myocardial infarction is still suspected,     repeat the test at appropriate intervals.     REPEATED TO VERIFY     CRITICAL VALUE NOTED.  VALUE IS CONSISTENT WITH PREVIOUSLY REPORTED AND CALLED VALUE.  CK TOTAL AND CKMB     Status: Abnormal   Collection Time    04/06/13  8:05 AM      Result Value Range   Total CK 436 (*) 7 - 232 U/L   CK, MB 29.1 (*) 0.3 - 4.0 ng/mL   Comment: CRITICAL RESULT CALLED TO, READ BACK BY AND VERIFIED WITH:     WORRELL J.,RN 04/06/13 0955 BY JONESJ   Relative Index 6.7 (*) 0.0 - 2.5  BASIC METABOLIC PANEL     Status: Abnormal   Collection Time    04/06/13  8:05 AM      Result Value Range   Sodium 139  135 - 145 mEq/L   Potassium 3.5  3.5 - 5.1 mEq/L   Chloride 102  96 - 112 mEq/L   CO2 27  19 - 32 mEq/L   Glucose, Bld 117 (*) 70 - 99 mg/dL   BUN 15  6 - 23 mg/dL   Creatinine, Ser 0.45  0.50 - 1.35 mg/dL   Calcium 9.0  8.4 - 40.9 mg/dL   GFR calc non Af Amer 85 (*) >  90 mL/min   GFR calc Af Amer >90  >90 mL/min   Comment: (NOTE)     The eGFR has been calculated using the CKD EPI equation.     This calculation has not been validated in all clinical situations.     eGFR's persistently <90 mL/min signify possible Chronic Kidney     Disease.  CBC     Status: Abnormal   Collection Time    04/06/13  8:05 AM       Result Value Range   WBC 7.0  4.0 - 10.5 K/uL   RBC 4.28  4.22 - 5.81 MIL/uL   Hemoglobin 13.4  13.0 - 17.0 g/dL   HCT 57.8 (*) 46.9 - 62.9 %   MCV 88.6  78.0 - 100.0 fL   MCH 31.3  26.0 - 34.0 pg   MCHC 35.4  30.0 - 36.0 g/dL   RDW 52.8  41.3 - 24.4 %   Platelets 148 (*) 150 - 400 K/uL  PROTIME-INR     Status: None   Collection Time    04/06/13  8:05 AM      Result Value Range   Prothrombin Time 12.8  11.6 - 15.2 seconds   INR 0.98  0.00 - 1.49  TROPONIN I     Status: Abnormal   Collection Time    04/06/13  1:10 PM      Result Value Range   Troponin I 3.21 (*) <0.30 ng/mL   Comment:            Due to the release kinetics of cTnI,     a negative result within the first hours     of the onset of symptoms does not rule out     myocardial infarction with certainty.     If myocardial infarction is still suspected,     repeat the test at appropriate intervals.     REPEATED TO VERIFY     CRITICAL VALUE NOTED.  VALUE IS CONSISTENT WITH PREVIOUSLY REPORTED AND CALLED VALUE.  CK TOTAL AND CKMB     Status: Abnormal   Collection Time    04/06/13  1:10 PM      Result Value Range   Total CK 359 (*) 7 - 232 U/L   CK, MB 22.7 (*) 0.3 - 4.0 ng/mL   Comment: CRITICAL VALUE NOTED.  VALUE IS CONSISTENT WITH PREVIOUSLY REPORTED AND CALLED VALUE.   Relative Index 6.3 (*) 0.0 - 2.5  HEPARIN LEVEL (UNFRACTIONATED)     Status: Abnormal   Collection Time    04/06/13  2:37 PM      Result Value Range   Heparin Unfractionated 0.22 (*) 0.30 - 0.70 IU/mL   Comment:            IF HEPARIN RESULTS ARE BELOW     EXPECTED VALUES, AND PATIENT     DOSAGE HAS BEEN CONFIRMED,     SUGGEST FOLLOW UP TESTING     OF ANTITHROMBIN III LEVELS.  TROPONIN I     Status: Abnormal   Collection Time    04/06/13  7:25 PM      Result Value Range   Troponin I 2.24 (*) <0.30 ng/mL   Comment:            Due to the release kinetics of cTnI,     a negative result within the first hours     of the onset of  symptoms does not rule out     myocardial  infarction with certainty.     If myocardial infarction is still suspected,     repeat the test at appropriate intervals.     REPEATED TO VERIFY     CRITICAL VALUE NOTED.  VALUE IS CONSISTENT WITH PREVIOUSLY REPORTED AND CALLED VALUE.  CK TOTAL AND CKMB     Status: Abnormal   Collection Time    04/06/13  7:25 PM      Result Value Range   Total CK 287 (*) 7 - 232 U/L   CK, MB 14.2 (*) 0.3 - 4.0 ng/mL   Comment: CRITICAL VALUE NOTED.  VALUE IS CONSISTENT WITH PREVIOUSLY REPORTED AND CALLED VALUE.   Relative Index 4.9 (*) 0.0 - 2.5  HEPARIN LEVEL (UNFRACTIONATED)     Status: Abnormal   Collection Time    04/06/13 11:19 PM      Result Value Range   Heparin Unfractionated 0.23 (*) 0.30 - 0.70 IU/mL   Comment:            IF HEPARIN RESULTS ARE BELOW     EXPECTED VALUES, AND PATIENT     DOSAGE HAS BEEN CONFIRMED,     SUGGEST FOLLOW UP TESTING     OF ANTITHROMBIN III LEVELS.    Dg Chest 2 View  04/05/2013   CLINICAL DATA:  Dizziness and syncope. History of hypertension.  EXAM: CHEST  2 VIEW  COMPARISON:  06/22/2011  FINDINGS: The heart size and mediastinal contours are within normal limits. Both lungs are clear. There has been a previous anterior cervical spine fusion. The bony thorax is intact.  IMPRESSION: No active cardiopulmonary disease.   Electronically Signed   By: Amie Portland   On: 04/05/2013 17:10    Review of Systems  Constitutional: Positive for diaphoresis.  Respiratory: Positive for shortness of breath.   Cardiovascular: Negative for chest pain.  Neurological: Positive for dizziness.  All other systems reviewed and are negative.   Blood pressure 112/61, pulse 69, temperature 97.7 F (36.5 C), temperature source Oral, resp. rate 16, height 6' (1.829 m), weight 212 lb (96.163 kg), SpO2 100.00%. Physical Exam  Vitals reviewed. Constitutional: He is oriented to person, place, and time. He appears well-developed and  well-nourished. No distress.  HENT:  Head: Normocephalic and atraumatic.  Eyes: EOM are normal. Pupils are equal, round, and reactive to light.  Neck: Neck supple. No thyromegaly present.  Cardiovascular: Normal rate, regular rhythm and normal heart sounds.  Exam reveals no gallop and no friction rub.   No murmur heard. Respiratory: Effort normal and breath sounds normal. He has no wheezes. He has no rales.  GI: Soft. There is no tenderness.  Musculoskeletal: He exhibits no edema.  Lymphadenopathy:    He has no cervical adenopathy.  Neurological: He is alert and oriented to person, place, and time. No cranial nerve deficit.  Skin: Skin is warm and dry.    Assessment/Plan: 67 yo WM with a presyncopal spell and NQWMI from a left atrial myxoma. This is a large tumor that is prolapsing through the mitral valve. He is at extremely high risk for embolic complications and hemodynamic collapse. Surgery is indicated for survival benefit and relief of symptoms.  I have discussed with the patient the general nature of the procedure, the need for general anesthesia, the use of cardiopulmonary bypass, and the incisions to be used. I have discussed the expected hospital stay, overall recovery and short and long term outcomes. He understands the risks include but are not limited to death,  stroke, MI, DVT/PE, bleeding, possible need for transfusion, infections,other organ system dysfunction including respiratory, renal, or GI complications. He accepts the risks and agrees to proceed.  Plan OR Wednesday 10/1  Daron Breeding C 04/07/2013, 4:38 PM

## 2013-04-09 NOTE — Transfer of Care (Signed)
Immediate Anesthesia Transfer of Care Note  Patient: Matthew Sullivan  Procedure(s) Performed: Procedure(s): EXCISION OF ATRIAL MYXOMA (Left) INTRAOPERATIVE TRANSESOPHAGEAL ECHOCARDIOGRAM (N/A)  Patient Location: ICU  Anesthesia Type:General  Level of Consciousness: unresponsive and Patient remains intubated per anesthesia plan  Airway & Oxygen Therapy: Patient remains intubated per anesthesia plan and Patient placed on Ventilator (see vital sign flow sheet for setting)  Post-op Assessment: Report given to PACU RN and Post -op Vital signs reviewed and stable  Post vital signs: Reviewed and stable  Complications: No apparent anesthesia complications

## 2013-04-09 NOTE — Progress Notes (Signed)
Pt has been transferred to CVTS.  Will be available as needed.  Crista Curb, M.D. [facility] [phone]

## 2013-04-09 NOTE — Preoperative (Signed)
Beta Blockers   Reason not to administer Beta Blockers:Metoprolol given at 0606 hrs on 04/09/13

## 2013-04-09 NOTE — Procedures (Signed)
Extubation Procedure Note  Patient Details:   Name: Matthew Sullivan DOB: Nov 06, 1945 MRN: [id number]   Airway Documentation:     Evaluation  O2 sats: stable throughout Complications: No apparent complications Patient did tolerate procedure well. Bilateral Breath Sounds: Coarse crackles Suctioning: Airway Yes  Pt extubated per protocol to 4 L [place]. No complications noted; VC 1.4 L and NIF -40.  IS performed X 4.  No stridor noted.  Vitals are stable.  RT will continue to monitor.   Epifanio Lesches A 04/09/2013, 5:22 PM

## 2013-04-09 NOTE — Anesthesia Procedure Notes (Addendum)
Procedure Name: Intubation Date/Time: 04/09/2013 8:47 AM Performed by: Charm Barges, Keaden Gunnoe R Pre-anesthesia Checklist: Patient identified, Emergency Drugs available, Suction available, Patient being monitored and Timeout performed Patient Re-evaluated:Patient Re-evaluated prior to inductionOxygen Delivery Method: Circle system utilized Preoxygenation: Pre-oxygenation with 100% oxygen Intubation Type: IV induction Ventilation: Mask ventilation without difficulty Grade View: Grade II Tube type: Oral Tube size: 8.0 mm Number of attempts: 1 Airway Equipment and Method: Rigid stylet and Video-laryngoscopy Placement Confirmation: ETT inserted through vocal cords under direct vision,  positive ETCO2 and breath sounds checked- equal and bilateral Secured at: 24 cm Tube secured with: Tape Dental Injury: Teeth and Oropharynx as per pre-operative assessment  Difficulty Due To: Difficult Airway- due to reduced neck mobility Future Recommendations: Recommend- induction with short-acting agent, and alternative techniques readily available    PA catheter:  Routine monitors. Timeout, sterile prep, drape, FBP R neck.  Trendelenburg position.  1% [place] local, finder and trocar RIJ 1st pass with US guidance.  Cordis placed over J wire. PA catheter in easily.  Sterile dressing applied.  Patient tolerated well, VSS.  Sandford Craze, MD  [phone]

## 2013-04-09 NOTE — Progress Notes (Signed)
  Echocardiogram Echocardiogram Transesophageal has been performed.  Matthew Sullivan 04/09/2013, 9:56 AM

## 2013-04-09 NOTE — Progress Notes (Signed)
UR Completed.  Vangie Bicker [phone] 04/09/2013

## 2013-04-09 NOTE — Progress Notes (Signed)
Patient ID: RUTILIO YELLOWHAIR, male   DOB: 02/04/1946, 67 y.o.   MRN: [id number]  SICU Evening Rounds:  Hemodynamically stable  Extubated and just dangled.  Urine output good CT output low  BMET    Component Value Date/Time   NA 141 04/09/2013 1416   K 4.2 04/09/2013 1416   CL 107 04/09/2013 0530   CO2 26 04/09/2013 0530   GLUCOSE 120* 04/09/2013 1416   BUN 12 04/09/2013 0530   CREATININE 0.87 04/09/2013 0530   CALCIUM 9.0 04/09/2013 0530   GFRNONAA 87* 04/09/2013 0530   GFRAA >90 04/09/2013 0530    CBC    Component Value Date/Time   WBC 10.9* 04/09/2013 1400   RBC 4.42 04/09/2013 1400   HGB 13.6 04/09/2013 1416   HCT 40.0 04/09/2013 1416   PLT 95* 04/09/2013 1400   MCV 89.4 04/09/2013 1400   MCH 30.1 04/09/2013 1400   MCHC 33.7 04/09/2013 1400   RDW 13.8 04/09/2013 1400   LYMPHSABS 1.3 02/01/2012 0945   MONOABS 0.6 02/01/2012 0945   EOSABS 0.1 02/01/2012 0945   BASOSABS 0.0 02/01/2012 0945

## 2013-04-09 NOTE — Care Management Note (Signed)
    Page 1 of 1   04/09/2013     11:54:46 AM   CARE MANAGEMENT NOTE 04/09/2013  Patient:  Matthew Sullivan, Matthew Sullivan   Account Number:  [id number]  Date Initiated:  04/06/2013  Documentation initiated by:  Lanier Clam  Subjective/Objective Assessment:   67 y/o m admitted w/syncope.nstemi.     Action/Plan:   From home.   Anticipated DC Date:  04/08/2013   Anticipated DC Plan:  HOME/SELF CARE         Choice offered to / List presented to:             Status of service:  In process, will continue to follow Medicare Important Message given?   (If response is "NO", the following Medicare IM given date fields will be blank) Date Medicare IM given:   Date Additional Medicare IM given:    Discharge Disposition:    Per UR Regulation:  Reviewed for med. necessity/level of care/duration of stay  If discussed at Long Length of Stay Meetings, dates discussed:   04/10/2013    Comments:  04-09-13 1151 Tomi Bamberger, RN,BSN [phone] Pt in with Nstemi- 2-D echo showed large left atrial myxoma prolapsing in and out of mitral outflow tract. Plan EXCISION OF ATRIAL MYXOMA (Left 04-09-13. CM will cintinue to f/u for disposition needs. CM did call [facility] Tobi Bastos for assistance with hospital bills.

## 2013-04-09 NOTE — Anesthesia Postprocedure Evaluation (Signed)
  Anesthesia Post-op Note  Patient: Matthew Sullivan  Procedure(s) Performed: Procedure(s): EXCISION OF ATRIAL MYXOMA (Left) INTRAOPERATIVE TRANSESOPHAGEAL ECHOCARDIOGRAM (N/A)  Patient Location: PACU  Anesthesia Type:General  Level of Consciousness: sedated and Patient remains intubated per anesthesia plan  Airway and Oxygen Therapy: Patient remains intubated per anesthesia plan  Post-op Pain: none  Post-op Assessment: Patient's Cardiovascular Status Stable  Post-op Vital Signs: stable  Complications: No apparent anesthesia complications

## 2013-04-09 NOTE — Interval H&P Note (Signed)
History and Physical Interval Note:  04/09/2013 8:00 AM  Matthew Sullivan  has presented today for surgery, with the diagnosis of (L) ATRIAL MYXOMA  The various methods of treatment have been discussed with the patient and family. After consideration of risks, benefits and other options for treatment, the patient has consented to  Procedure(s): EXCISION OF ATRIAL MYXOMA (Left) INTRAOPERATIVE TRANSESOPHAGEAL ECHOCARDIOGRAM (N/A) as a surgical intervention .  The patient's history has been reviewed, patient examined, no change in status, stable for surgery.  I have reviewed the patient's chart and labs.  Questions were answered to the patient's satisfaction.     Maggie Dworkin C

## 2013-04-09 NOTE — Brief Op Note (Addendum)
04/05/2013 - 04/09/2013  12:10 PM  PATIENT:  Matthew Sullivan  67 y.o. male  PRE-OPERATIVE DIAGNOSIS:  (L) ATRIAL MYXOMA  POST-OPERATIVE DIAGNOSIS:  (L) ATRIAL MYXOMA  PROCEDURE:  Procedure(s):  -EXCISION OF ATRIAL MYXOMA  -PATCH OF SEPTUM -CLOSURE OF PFO  INTRAOPERATIVE TRANSESOPHAGEAL ECHOCARDIOGRAM (N/A)  SURGEON:  Surgeon(s) and Role:    * Loreli Slot, MD - Primary  PHYSICIAN ASSISTANT: Lowella Dandy PA-C  ANESTHESIA:   general  EBL:  Total I/O In: -  Out: 125 [Urine:125]  BLOOD ADMINISTERED: CELLSAVER  DRAINS: Mediastinal chest drains   LOCAL MEDICATIONS USED:  NONE  SPECIMEN:  Source of Specimen:  Atrial Myxoma  DISPOSITION OF SPECIMEN:  PATHOLOGY  COUNTS:  YES  PLAN OF CARE: Admit to inpatient   PATIENT DISPOSITION:  ICU - intubated and hemodynamically stable.   Delay start of Pharmacological VTE agent (>24hrs) due to surgical blood loss or risk of bleeding: yes   XC= 107 min CPB= 155 min  Findings: large left atrial myoxoma attached to septum between the fossa ovalis and the mitral annulus by a thin stalk. + margin on initial excision. Additional atrial wall resected. Final margins (#5,6,7,8) all negative for tumor.  TEE post resection showed a small piece of tissue projecting into left atrium. Probably redundant portion of the patch. Also a small jet possibly from aorta to LA

## 2013-04-09 NOTE — Anesthesia Preprocedure Evaluation (Addendum)
Anesthesia Evaluation  Patient identified by MRN, date of birth, ID band Patient awake    Reviewed: Allergy & Precautions, H&P , NPO status , Patient's Chart, lab work & pertinent test results, reviewed documented beta blocker date and time   History of Anesthesia Complications Negative for: history of anesthetic complications  Airway Mallampati: II TM Distance: >3 FB Neck ROM: Limited    Dental  (+) Teeth Intact   Pulmonary neg pulmonary ROS,  breath sounds clear to auscultation        Cardiovascular hypertension, Pt. on medications and Pt. on home beta blockers + Past MI Rhythm:Regular Rate:Normal     Neuro/Psych CVA    GI/Hepatic   Endo/Other  negative endocrine ROS  Renal/GU      Musculoskeletal   Abdominal   Peds  Hematology negative hematology ROS (+)   Anesthesia Other Findings   Reproductive/Obstetrics                        Anesthesia Physical Anesthesia Plan  ASA: III  Anesthesia Plan: General   Post-op Pain Management:    Induction: Intravenous  Airway Management Planned: Oral ETT  Additional Equipment: Arterial line, TEE, 3D TEE and Ultrasound Guidance Line Placement  Intra-op Plan:   Post-operative Plan: Post-operative intubation/ventilation  Informed Consent: I have reviewed the patients History and Physical, chart, labs and discussed the procedure including the risks, benefits and alternatives for the proposed anesthesia with the patient or authorized representative who has indicated his/her understanding and acceptance.   Dental advisory given  Plan Discussed with: CRNA, Anesthesiologist and Surgeon  Anesthesia Plan Comments:        Anesthesia Quick Evaluation

## 2013-04-10 ENCOUNTER — Inpatient Hospital Stay (HOSPITAL_COMMUNITY): Payer: Medicare Other

## 2013-04-10 LAB — CBC
Hemoglobin: 10 g/dL — ABNORMAL LOW (ref 13.0–17.0)
MCH: 30.8 pg (ref 26.0–34.0)
MCHC: 34.3 g/dL (ref 30.0–36.0)
MCHC: 34 g/dL (ref 30.0–36.0)
MCV: 89.7 fL (ref 78.0–100.0)
Platelets: 105 10*3/uL — ABNORMAL LOW (ref 150–400)
RBC: 3.51 MIL/uL — ABNORMAL LOW (ref 4.22–5.81)
RDW: 14.3 % (ref 11.5–15.5)
RDW: 14.3 % (ref 11.5–15.5)

## 2013-04-10 LAB — BASIC METABOLIC PANEL
BUN: 16 mg/dL (ref 6–23)
CO2: 23 mEq/L (ref 19–32)
Calcium: 7.1 mg/dL — ABNORMAL LOW (ref 8.4–10.5)
Chloride: 106 mEq/L (ref 96–112)
Creatinine, Ser: 0.81 mg/dL (ref 0.50–1.35)
GFR calc Af Amer: >90 mL/min (ref >90)
GFR calc non Af Amer: 89 mL/min — ABNORMAL LOW (ref >90)

## 2013-04-10 LAB — GLUCOSE, CAPILLARY

## 2013-04-10 LAB — CREATININE, SERUM
Creatinine, Ser: 0.9 mg/dL (ref 0.50–1.35)
GFR calc non Af Amer: 85 mL/min — ABNORMAL LOW (ref >90)

## 2013-04-10 LAB — POCT I-STAT, CHEM 8
BUN: 20 mg/dL (ref 6–23)
Chloride: 102 mEq/L (ref 96–112)
Creatinine, Ser: 1.1 mg/dL (ref 0.50–1.35)
Glucose, Bld: 151 mg/dL — ABNORMAL HIGH (ref 70–99)
HCT: 28 % — ABNORMAL LOW (ref 39.0–52.0)
Potassium: 4.1 mEq/L (ref 3.5–5.1)

## 2013-04-10 LAB — MAGNESIUM
Magnesium: 2.4 mg/dL (ref 1.5–2.5)
Magnesium: 2.6 mg/dL — ABNORMAL HIGH (ref 1.5–2.5)

## 2013-04-10 MED ORDER — POTASSIUM CHLORIDE 10 MEQ/50ML IV SOLN
10.0000 meq | INTRAVENOUS | Status: AC
  Administered 2013-04-10 (×3): 10 meq via INTRAVENOUS
  Filled 2013-04-10 (×3): qty 50

## 2013-04-10 MED ORDER — INSULIN ASPART 100 UNIT/ML ~~LOC~~ SOLN
0.0000 [IU] | SUBCUTANEOUS | Status: DC
  Administered 2013-04-10 – 2013-04-11 (×2): 2 [IU] via SUBCUTANEOUS

## 2013-04-10 MED ORDER — FUROSEMIDE 10 MG/ML IJ SOLN
40.0000 mg | Freq: Once | INTRAMUSCULAR | Status: AC
  Administered 2013-04-10: 40 mg via INTRAVENOUS
  Filled 2013-04-10: qty 4

## 2013-04-10 MED ORDER — ALBUMIN HUMAN 5 % IV SOLN
12.5000 g | Freq: Once | INTRAVENOUS | Status: AC
  Administered 2013-04-10: 12.5 g via INTRAVENOUS
  Filled 2013-04-10: qty 250

## 2013-04-10 MED ORDER — ENOXAPARIN SODIUM 40 MG/0.4ML ~~LOC~~ SOLN
40.0000 mg | Freq: Every day | SUBCUTANEOUS | Status: DC
  Administered 2013-04-10: 40 mg via SUBCUTANEOUS
  Filled 2013-04-10 (×3): qty 0.4

## 2013-04-10 MED ORDER — GUAIFENESIN ER 600 MG PO TB12
1200.0000 mg | ORAL_TABLET | Freq: Two times a day (BID) | ORAL | Status: DC | PRN
  Filled 2013-04-10: qty 2

## 2013-04-10 MED ORDER — BENZONATATE 100 MG PO CAPS
200.0000 mg | ORAL_CAPSULE | Freq: Three times a day (TID) | ORAL | Status: DC | PRN
  Filled 2013-04-10: qty 2

## 2013-04-10 MED ORDER — KETOROLAC TROMETHAMINE 15 MG/ML IJ SOLN
15.0000 mg | Freq: Four times a day (QID) | INTRAMUSCULAR | Status: AC
  Administered 2013-04-10 – 2013-04-12 (×5): 15 mg via INTRAVENOUS
  Filled 2013-04-10 (×6): qty 1

## 2013-04-10 MED FILL — Heparin Sodium (Porcine) Inj 1000 Unit/ML: INTRAMUSCULAR | Qty: 30 | Status: AC

## 2013-04-10 MED FILL — Magnesium Sulfate Inj 50%: INTRAMUSCULAR | Qty: 10 | Status: AC

## 2013-04-10 MED FILL — Potassium Chloride Inj 2 mEq/ML: INTRAVENOUS | Qty: 40 | Status: AC

## 2013-04-10 MED FILL — Sodium Chloride IV Soln 0.9%: INTRAVENOUS | Qty: 1000 | Status: AC

## 2013-04-10 NOTE — Progress Notes (Signed)
1 Day Post-Op Procedure(s) (LRB): EXCISION OF ATRIAL MYXOMA (Left) INTRAOPERATIVE TRANSESOPHAGEAL ECHOCARDIOGRAM (N/A) Subjective: Some incisional pain No nausea Tingling in thumb, index finger of right hand  Objective: Vital signs in last 24 hours: Temp:  [98.6 F (37 C)-101.1 F (38.4 C)] 99.9 F (37.7 C) (10/02 0700) Pulse Rate:  [50-93] 93 (10/02 0700) Cardiac Rhythm:  [-] Atrial paced (10/02 0500) Resp:  [10-25] 20 (10/02 0700) BP: (82-106)/(51-62) 106/55 mmHg (10/02 0600) SpO2:  [97 %-100 %] 99 % (10/02 0700) Arterial Line BP: (79-141)/(47-82) 125/53 mmHg (10/02 0700) FiO2 (%):  [40 %-50 %] 40 % (10/01 1630) Weight:  [219 lb 9.3 oz (99.6 kg)] 219 lb 9.3 oz (99.6 kg) (10/02 0500)  Hemodynamic parameters for last 24 hours: PAP: (24-38)/(12-24) 29/14 mmHg CO:  [3.4 L/min-7.5 L/min] 7.5 L/min CI:  [1.6 L/min/m2-3.5 L/min/m2] 3.5 L/min/m2  Intake/Output from previous day: 10/01 0701 - 10/02 0700 In: 6309.9 [I.V.:3853.9; Blood:1126; NG/GT:30; IV Piggyback:1300] Out: 4815 [Urine:2925; Blood:1650; Chest Tube:240] Intake/Output this shift:    General appearance: alert and no distress Neurologic: Alert, oriented, no motor deficit, decreased sensation right thumb, index Heart: RRR, no murmur, + rub Lungs: diminished breath sounds bibasilar Abdomen: normal findings: soft, non-tender  Lab Results:  Recent Labs  04/09/13 1900 04/09/13 1928 04/10/13 0420  WBC 11.5*  --  11.2*  HGB 12.1* 11.2* 10.8*  HCT 35.2* 33.0* 31.5*  PLT 101*  --  105*   BMET:  Recent Labs  04/09/13 0530  04/09/13 1928 04/10/13 0420  NA 144  < > 140 139  K 3.6  < > 4.2 3.7  CL 107  --  107 106  CO2 26  --   --  23  GLUCOSE 106*  < > 140* 153*  BUN 12  --  12 16  CREATININE 0.87  < > 0.90 0.81  CALCIUM 9.0  --   --  7.1*  < > = values in this interval not displayed.  PT/INR:  Recent Labs  04/09/13 1400  LABPROT 16.0*  INR 1.31   ABG    Component Value Date/Time   PHART 7.322*  04/09/2013 1924   HCO3 23.1 04/09/2013 1924   TCO2 23 04/09/2013 1928   ACIDBASEDEF 3.0* 04/09/2013 1924   O2SAT 97.0 04/09/2013 1924   CBG (last 3)   Recent Labs  04/09/13 1634 04/09/13 2343 04/10/13 0441  GLUCAP 80 131* 127*    Assessment/Plan: S/P Procedure(s) (LRB): EXCISION OF ATRIAL MYXOMA (Left) INTRAOPERATIVE TRANSESOPHAGEAL ECHOCARDIOGRAM (N/A) POD # 1 CV- good cardiac output  Sinus brady- dc metoprolol for now, AP until native rate picks up  Dc swan  RESP- IS for bibasilar atelectasis  RENAL- lytes and creatinine OK, volume overloaded- diurese  ENDO- CBG well controlled  ANEMIA secondary to ABL, expected, follow  Thrombocytopenia- stable  DVT prophylaxis- SCD, will add lovenox as well but will need to keep an eye on PLT  NEURO- paresthesias right hand- both hands are pale and cool- ? Vasospasm, wean gtts and follow  Could be brachial plexopathy but that more commonly affects the 4th and 5th fingers  DC CT  OOB, ambulate   LOS: 5 days    HENDRICKSON,STEVEN C 04/10/2013

## 2013-04-10 NOTE — Progress Notes (Signed)
Pt states his right hand has felt numb since surgery. Pt has +2 right radial pulse and good capillary refill. Pt is able to move all fingers. Hand elevated on a pillow to help reduce edema in fingers and hand. Will continue to monitor.  Alfonso Ellis, RN

## 2013-04-10 NOTE — Op Note (Signed)
NAME:  GREGORY, BARRICK NO.:  [id number]  MEDICAL RECORD NO.:  [id number]  LOCATION:  2S09C                        FACILITY:  [facility]  PHYSICIAN:  Salvatore Decent. Dorris Fetch, M.D.DATE OF BIRTH:  1946/01/17  DATE OF PROCEDURE:  04/09/2013 DATE OF DISCHARGE:                              OPERATIVE REPORT   PREOPERATIVE DIAGNOSIS:  Left atrial myxoma.  POSTOPERATIVE DIAGNOSIS:  Left atrial myxoma.  PROCEDURE:  Median sternotomy, extracorporeal circulation, excision of left atrial myxoma, patch repair with cor matrix, closure of patent foramen ovale.  SURGEON:  Salvatore Decent. Dorris Fetch, M.D.  ASSISTANT:  Lowella Dandy, PA.  ANESTHESIA:  General.  FINDINGS:  Large left atrial myxoma prolapsed through the mitral valve, arising from atrial septum between the fossa ovalis and mitral anulus, attached by approximately 8 mm diameter stalk.  Initial frozen section of margins showed myxoma, additional tissue resected, final pathologic margins negative.  CLINICAL NOTE:  Mr. Kinley is a 67 year old gentleman, who presented following a near syncopal spell.  His workup included an echocardiogram, which showed a large left atrial myxoma measuring approximately 6 cm in diameter which was mobile and prolapsing through the mitral valve during diastole.  The patient was advised to undergo surgical resection of the left atrial myxoma.  The indications, risks, benefits, and alternatives were discussed in detail with the patient.  He understood and accepted the risks and agreed to proceed.  OPERATIVE NOTE:  Mr. Overfield was brought to the preoperative holding area on April 09, 2013, there Anesthesia placed a Swan-Ganz catheter and arterial blood pressure monitoring line.  Intravenous antibiotics were administered.  He was taken to the operating room, anesthetized, and intubated.  A Foley catheter was placed.  Transesophageal echocardiography was performed.  It confirmed the presence of a  large left atrial myxoma.  There was no right atrial component.  The myxoma was prolapsing through the mitral valve.  There was no pathology of the mitral valve itself with only trace mitral regurgitation.  There was preserved left ventricular systolic function.  The chest, abdomen, and legs were prepped and draped in usual sterile fashion.  A median sternotomy was performed.  After achieving initial hemostasis, the retractor was placed.  Heparin was administered.  The pericardium was opened.  After confirming adequate anticoagulation with ACT measurement, the aorta was cannulated via concentric 2-0 Ethibond pledgeted pursestring sutures.  A 31-French venous cannula was placed into the superior vena cava, and cardiopulmonary bypass was instituted. A 36-French venous cannula then was placed through a separate pursestring into the inferior vena cava and connected to the bypass circuit.  Flows were maintained per protocol.  The patient was cooled to 32 degrees Celsius.  A retrograde cardioplegia cannula was placed via pursestring suture in the right atrium and directed into the coronary sinus.  An antegrade cardioplegia cannula placed in the ascending aorta.  The aorta was crossclamped.  The left ventricle was emptied via the aortic root vent.  Cardiac arrest then was achieved with a combination of cold antegrade and retrograde blood cardioplegia and topical iced saline.  1 L of cardioplegia was administered antegrade.  There was a diastolic arrest.  An additional liter of cardioplegia was administered  retrograde.  There was septal cooling to less than 10 degrees Celsius.  The interatrial groove was dissected out and the left atrium was opened.  The myxoma was immediately apparent.  It was attached by a stalk to the atrial septum midway between the fossa ovalis and the mitral annulus initially to get the bulk of the tumor out.  The stalk was transected,  The tumor was removed. It was  approximately 8 cm in total length.  Next, the stalk was grasped and portion of the atrial septum was excised around the stalk with approximately a 1 cm margin on either side.  This was sent to pathology for frozen section.  A cor-matrix patch then was used to close the atrial septum with a running 4-0 Prolene suture.  After completing the patch repair, the left atriotomy was closed in 2 layers with running 4-0 Prolene sutures.  The right atriotomy was performed.  A suture was placed closing the patent foramen ovale.  At this point, the right atrium was closed.  The pathologist then called to say that the specimen did show myxoma. It was unclear if this was truly at the margin or if frozen included the actual stalk itself. To be sure the decision made to excise additional atrial tissue.  The left atriotomy was reopened and retractor was placed.  The patch was taken down.  An additional margin of myocardium was taken at the site of the resection. This was divided into 4 pieces representing the "compass points" of the defect and labeled 1, 2, 3, and 4.  Then, an additional margin was taken and again labled in a similar fashion, with specimens 5, 6, 7, and 8 representing the final margins.  A 2nd piece of core matrix was prepared and sewn over the defect with a running 4-0 Prolene suture. The left atriotomy then was closed again in 2 layers with running 4-0 Prolene horizontal mattress suture followed by a simple suture and the atrium was de-aired as it was closed.  After closing the left atriotomy, the aortic crossclamp was removed.  Total crossclamp time was 107 minutes.  It should be noted that additional cardioplegia was administered at 20 minute intervals during the crossclamp portion of the procedure via the retrograde cannula.  After removal of cross-clamp, the patient was initially in a bradycardic ventricular rhythm.  All suture lines were inspected for hemostasis. There was bleeding  from the dome of the left atrium which was repaired with 4-0 Prolene horizontal mattress sutures with Teflon felt pledgets.  Epicardial pacing wires were placed on the right ventricle and right atrium and DDD pacing was initiated at 80 beats per minute.  The dopamine infusion was initiated at 3 mcg/kg/min.  When the patient had rewarmed to a core temperature of 37 degrees Celsius, he was weaned from cardiopulmonary bypass on the first attempt without difficulty.  The total bypass time was 155 minutes.  The initial cardiac index was greater than 2 liters/minute/meter squared.  Postbypass transesophageal echocardiography revealed approximately 8 x 4 mm piece of tissue protruding from the area of the patch repair. This was likely a redundant portion of the patch.  There was a question of a very small fistula from the aorta to the left atrium in 1- view of the echo.  This was not felt to be of clinical significance to warrant additional crossclamp time.  The inferior vena caval cannula was removed, as was the retrograde cardioplegic cannula.  The test dose of protamine was administered  and was well tolerated.  The superior vena caval cannula, aortic cardioplegic cannula, and aortic cannula were removed.  The remainder of protamine was administered without incident.  Chest was irrigated with warm saline.  Hemostasis was achieved.  The pericardium was reapproximated with interrupted 3-0 silk sutures.  It came together easily without tension.  A single mediastinal chest tube was placed through a separate subcostal incision.  The sternum was closed with interrupted single and double heavy gauge stainless steel wires.  The pectoralis fascia, subcutaneous tissue, and skin were closed in standard fashion.  All sponge, needle, and instrument counts were correct at the end of the procedure.  The patient remained hemodynamically stable throughout the post bypass.  He was transported from the  operating room to the surgical intensive care unit in good condition.     Salvatore Decent Dorris Fetch, M.D.     SCH/MEDQ  D:  04/09/2013  T:  04/10/2013  Job:  [id number]

## 2013-04-10 NOTE — Progress Notes (Signed)
CT surgery p.m. Rounds   Sitting in chair comfortable postop day 1 after resection of atrial myxoma Feels much better Sinus rhythm , a pacing stable blood pressure P.m. labs satisfactory potassium 4.1 hematocrit 29 glucose 133

## 2013-04-11 ENCOUNTER — Inpatient Hospital Stay (HOSPITAL_COMMUNITY): Payer: Medicare Other

## 2013-04-11 LAB — GLUCOSE, CAPILLARY
Glucose-Capillary: 121 mg/dL — ABNORMAL HIGH (ref 70–99)
Glucose-Capillary: 198 mg/dL — ABNORMAL HIGH (ref 70–99)
Glucose-Capillary: 131 mg/dL — ABNORMAL HIGH (ref 70–99)

## 2013-04-11 LAB — CBC
HCT: 26.1 % — ABNORMAL LOW (ref 39.0–52.0)
Hemoglobin: 8.8 g/dL — ABNORMAL LOW (ref 13.0–17.0)
MCH: 30.4 pg (ref 26.0–34.0)
MCV: 90.3 fL (ref 78.0–100.0)
Platelets: 70 10*3/uL — ABNORMAL LOW (ref 150–400)
RDW: 14.3 % (ref 11.5–15.5)
WBC: 7.9 10*3/uL (ref 4.0–10.5)

## 2013-04-11 LAB — BASIC METABOLIC PANEL
BUN: 24 mg/dL — ABNORMAL HIGH (ref 6–23)
CO2: 26 mEq/L (ref 19–32)
Calcium: 7.6 mg/dL — ABNORMAL LOW (ref 8.4–10.5)
Chloride: 100 mEq/L (ref 96–112)
Creatinine, Ser: 0.86 mg/dL (ref 0.50–1.35)
GFR calc Af Amer: >90 mL/min (ref >90)
Glucose, Bld: 131 mg/dL — ABNORMAL HIGH (ref 70–99)

## 2013-04-11 MED ORDER — ALPRAZOLAM 0.25 MG PO TABS: 0.2500 mg | ORAL_TABLET | Freq: Four times a day (QID) | ORAL | Status: DC | PRN

## 2013-04-11 MED ORDER — SODIUM CHLORIDE 0.9 % IJ SOLN: 3.0000 mL | INTRAMUSCULAR | Status: DC | PRN

## 2013-04-11 MED ORDER — ALUM & MAG HYDROXIDE-SIMETH 200-200-20 MG/5ML PO SUSP: 15.0000 mL | ORAL | Status: DC | PRN

## 2013-04-11 MED ORDER — SODIUM CHLORIDE 0.9 % IJ SOLN
3.0000 mL | Freq: Two times a day (BID) | INTRAMUSCULAR | Status: DC
Start: 2013-04-11 — End: 2013-04-13
  Administered 2013-04-11 – 2013-04-12 (×3): 3 mL via INTRAVENOUS

## 2013-04-11 MED ORDER — INSULIN ASPART 100 UNIT/ML ~~LOC~~ SOLN
0.0000 [IU] | Freq: Three times a day (TID) | SUBCUTANEOUS | Status: DC
  Administered 2013-04-11 (×2): 2 [IU] via SUBCUTANEOUS
  Administered 2013-04-11: 3 [IU] via SUBCUTANEOUS
  Administered 2013-04-12: 2 [IU] via SUBCUTANEOUS
  Administered 2013-04-12: 3 [IU] via SUBCUTANEOUS
  Administered 2013-04-12: 2 [IU] via SUBCUTANEOUS

## 2013-04-11 MED ORDER — ZOLPIDEM TARTRATE 5 MG PO TABS: 5.0000 mg | ORAL_TABLET | Freq: Every evening | ORAL | Status: DC | PRN

## 2013-04-11 MED ORDER — SODIUM CHLORIDE 0.9 % IV SOLN: 250.0000 mL | INTRAVENOUS | Status: DC | PRN

## 2013-04-11 MED ORDER — POTASSIUM CHLORIDE CRYS ER 20 MEQ PO TBCR
20.0000 meq | EXTENDED_RELEASE_TABLET | Freq: Two times a day (BID) | ORAL | Status: DC
  Administered 2013-04-11 – 2013-04-13 (×5): 20 meq via ORAL
  Filled 2013-04-11 (×7): qty 1

## 2013-04-11 MED ORDER — FUROSEMIDE 40 MG PO TABS
40.0000 mg | ORAL_TABLET | Freq: Every day | ORAL | Status: DC
  Administered 2013-04-11 – 2013-04-13 (×3): 40 mg via ORAL
  Filled 2013-04-11 (×3): qty 1

## 2013-04-11 MED ORDER — MOVING RIGHT ALONG BOOK
Freq: Once | Status: AC
  Administered 2013-04-11: 12:00:00
  Filled 2013-04-11: qty 1

## 2013-04-11 MED ORDER — MAGNESIUM HYDROXIDE 400 MG/5ML PO SUSP: 30.0000 mL | Freq: Every day | ORAL | Status: DC | PRN

## 2013-04-11 MED FILL — Sodium Chloride Irrigation Soln 0.9%: Qty: 3000 | Status: AC

## 2013-04-11 MED FILL — Mannitol IV Soln 20%: INTRAVENOUS | Qty: 500 | Status: AC

## 2013-04-11 MED FILL — Heparin Sodium (Porcine) Inj 1000 Unit/ML: INTRAMUSCULAR | Qty: 10 | Status: AC

## 2013-04-11 MED FILL — Sodium Bicarbonate IV Soln 8.4%: INTRAVENOUS | Qty: 50 | Status: AC

## 2013-04-11 MED FILL — Electrolyte-R (PH 7.4) Solution: INTRAVENOUS | Qty: 4000 | Status: AC

## 2013-04-11 MED FILL — Lidocaine HCl IV Inj 20 MG/ML: INTRAVENOUS | Qty: 5 | Status: AC

## 2013-04-11 NOTE — Plan of Care (Signed)
Problem: Phase III Progression Outcomes Goal: Time patient transferred to PCTU/Telemetry POD Outcome: Completed/Met Date Met:  04/11/13 Transfer ~1105 04/11/13

## 2013-04-11 NOTE — Progress Notes (Signed)
1410 Offered to walk with pt. He walked over at transfer and is resting now. Stated would like to walk later and rest now. Pt to notify nurse when ready to walk.  Will continue to follow. Nusayba Cadenas DunlapRNBSN

## 2013-04-11 NOTE — Progress Notes (Signed)
2 Days Post-Op Procedure(s) (LRB): EXCISION OF ATRIAL MYXOMA (Left) INTRAOPERATIVE TRANSESOPHAGEAL ECHOCARDIOGRAM (N/A) Subjective: Unable to sleep last night Not much incisional pain  Objective: Vital signs in last 24 hours: Temp:  [97.3 F (36.3 C)-99.9 F (37.7 C)] 97.4 F (36.3 C) (10/03 0400) Pulse Rate:  [89-96] 89 (10/03 0700) Cardiac Rhythm:  [-] Atrial paced (10/03 0600) Resp:  [13-23] 17 (10/03 0600) BP: (89-117)/(54-62) 112/61 mmHg (10/03 0700) SpO2:  [91 %-100 %] 100 % (10/03 0700) Arterial Line BP: (98-136)/(42-59) 115/42 mmHg (10/03 0400) Weight:  [220 lb 14.4 oz (100.2 kg)] 220 lb 14.4 oz (100.2 kg) (10/03 0500)  Hemodynamic parameters for last 24 hours: PAP: (28-31)/(15-16) 31/15 mmHg  Intake/Output from previous day: 10/02 0701 - 10/03 0700 In: 762.3 [I.V.:712.3; IV Piggyback:50] Out: 1240 [Urine:1240] Intake/Output this shift:    General appearance: alert and no distress Neurologic: intact Heart: regular rate and rhythm Lungs: diminished breath sounds bibasilar Abdomen: normal findings: soft, non-tender  Lab Results:  Recent Labs  04/10/13 1703 04/11/13 0448  WBC 9.7 7.9  HGB 10.0* 8.8*  HCT 29.4* 26.1*  PLT 98* 70*   BMET:  Recent Labs  04/10/13 0420 04/10/13 1635 04/10/13 1703 04/11/13 0448  NA 139 138  --  135  K 3.7 4.1  --  3.9  CL 106 102  --  100  CO2 23  --   --  26  GLUCOSE 153* 151*  --  131*  BUN 16 20  --  24*  CREATININE 0.81 1.10 0.90 0.86  CALCIUM 7.1*  --   --  7.6*    PT/INR:  Recent Labs  04/09/13 1400  LABPROT 16.0*  INR 1.31   ABG    Component Value Date/Time   PHART 7.322* 04/09/2013 1924   HCO3 23.1 04/09/2013 1924   TCO2 24 04/10/2013 1635   ACIDBASEDEF 3.0* 04/09/2013 1924   O2SAT 97.0 04/09/2013 1924   CBG (last 3)   Recent Labs  04/10/13 2004 04/10/13 2345 04/11/13 0358  GLUCAP 133* 116* 121*    Assessment/Plan: S/P Procedure(s) (LRB): EXCISION OF ATRIAL MYXOMA (Left) INTRAOPERATIVE  TRANSESOPHAGEAL ECHOCARDIOGRAM (N/A) Plan for transfer to step-down: see transfer orders  POD # 2  CV- stable- in SR at 65- change pacer to backup at 50  RESP- continue IS for bibasilar atelectasis  RENAL- lytes and creatinine OK, diurese as BP allows  ENDO- CBG well controlled  Thrombocytopenia- PLT count down after starting enoxaparin- dc enoxaparin, check HIT, SCD for DVT prophylaxis   LOS: 6 days    HENDRICKSON,STEVEN C 04/11/2013

## 2013-04-11 NOTE — Progress Notes (Signed)
Pt transferred to 2W16. Pt ambulated distance, tolerated well. Pt transported on portable tele, room air. Pt wearing glasses on transfer. Wife notified of new room number. Receiving RN at bedside with pt arrival. Placed on receiving units tele monitor. Will continue to monitor. Koren Bound

## 2013-04-12 ENCOUNTER — Inpatient Hospital Stay (HOSPITAL_COMMUNITY): Payer: Medicare Other

## 2013-04-12 LAB — CBC
HCT: 26.3 % — ABNORMAL LOW (ref 39.0–52.0)
MCV: 90.7 fL (ref 78.0–100.0)
Platelets: 101 10*3/uL — ABNORMAL LOW (ref 150–400)
RBC: 2.9 MIL/uL — ABNORMAL LOW (ref 4.22–5.81)
WBC: 8.8 10*3/uL (ref 4.0–10.5)

## 2013-04-12 LAB — BASIC METABOLIC PANEL
BUN: 33 mg/dL — ABNORMAL HIGH (ref 6–23)
CO2: 25 mEq/L (ref 19–32)
Chloride: 100 mEq/L (ref 96–112)
Creatinine, Ser: 1.18 mg/dL (ref 0.50–1.35)
GFR calc Af Amer: 71 mL/min — ABNORMAL LOW (ref >90)
Potassium: 4.3 mEq/L (ref 3.5–5.1)

## 2013-04-12 LAB — GLUCOSE, CAPILLARY
Glucose-Capillary: 125 mg/dL — ABNORMAL HIGH (ref 70–99)
Glucose-Capillary: 127 mg/dL — ABNORMAL HIGH (ref 70–99)
Glucose-Capillary: 156 mg/dL — ABNORMAL HIGH (ref 70–99)
Glucose-Capillary: 120 mg/dL — ABNORMAL HIGH (ref 70–99)

## 2013-04-12 NOTE — Progress Notes (Addendum)
Pacing wires removed.  Wires intact on removal. Patient tolerated procedure well.  Sites covered with a dry, sterile dressing.

## 2013-04-12 NOTE — Op Note (Signed)
NAME:  Matthew Sullivan, Matthew Sullivan NO.:  [id number]  MEDICAL RECORD NO.:  [id number]  LOCATION:  [id number]                        FACILITY:  [facility]  PHYSICIAN:  Burna Forts, M.D.DATE OF BIRTH:  Jun 01, 1946  DATE OF PROCEDURE:  04/09/2013 DATE OF DISCHARGE:                              OPERATIVE REPORT   INDICATIONS FOR PROCEDURE:  Matthew Sullivan is a 67 year old gentleman, who presents today for removal of a left atrial mass by Dr. Charlett Lango.  He is brought to the holding area the morning of surgery where under local anesthesia with sedation, pulmonary artery and radial arterial lines were placed.  He was then taken to the OR for routine induction of general anesthesia, after which the TEE probe is prepared, then passed oropharyngeally into the stomach, then slightly withdrawn for imaging of the cardiac structures.  PRECARDIOPULMONARY BYPASS TEE EXAMINATION:  Left ventricle:  The left ventricular chamber is seen initially in the short axis view.  This is a normal left ventricular chamber in appearance, size, and function. Estimated ejection fraction is greater than 50%.  All segmental wall areas are contractile.  Mitral valve:  This is a normal mitral valve apparatus seen initially in the 4-chamber view.  Multiple views are carried out.  The mitral valve is thin, compliant, mobile.  Left atrium:  The left atrium is initially seen in its 4 chamber view. It is mildly dilated.  There is a large multilobular mobile mass appreciated within the chamber.  It measures approximately 5-6 cm in length and 3-4 cm in diameter.  It is quite irregularly shaped.  It does appear to be attached at the lower portion of the atrial wall just posterior to the aortic valve in the short axis view by a long but fairly thick stalk.  This area is approximately a cm in diameter.  As mentioned, it is just posterior to the noncoronary cusp of the aortic valve, but attached to the atrial wall.   Multiple interrogations along the interatrial septum are unable to show any sort of regurgitant jets or flows.  This mass, in fact, prolapsed severely across the mitral valve into the upper portion of the left ventricular chamber during the diastolic systolic cycle.  Again multiple views are carried out.  The right ventricle, tricuspid valve, and right atrial are normal structures in size, shape, and function.  Cardiopulmonary bypass is begun.  The left atrial chamber is opened and the mass is removed during the bypass.  The patch repair is performed and associated with closure of the left atrial and interatrial septum. De-airing maneuvers were carried out and the patient is prepared for separation of cardiopulmonary bypass.  POST CARDIOPULMONARY BYPASS TEE EXAMINATION:  Directed exam:  Attention is drawn carefully to the area of the interatrial septum and the aortic valve in the short axis view.  The mass is in fact removed.  There is a patch that is visible in the lower portion of the inner atrial septum. Also visualized is a small 1 x 1 cm flap on the left atrial side.  It is mobile.  At the periphery of the patch itself, there is a tiny jet of flow which appears to  come either across the atrium or through the patch itself.  Due to the drop out of the images, it was not exactly able to be determined where this jet originated from.  It was not well visualized, but it was small.  Overall the mass itself had been removed in total.  The rest of the cardiac examination was as previously described.  The patient was then returned to the cardiac intensive care unit in stable condition.          ______________________________ Burna Forts, M.D.     JTM/MEDQ  D:  04/12/2013  T:  04/12/2013  Job:  [id number]

## 2013-04-12 NOTE — Progress Notes (Signed)
CARDIAC REHAB PHASE I   PRE:  Rate/Rhythm: Sinus 67  BP:    Sitting: 118/60     SaO2: 98 Room Air  MODE:  Ambulation: 550 ft   POST:  Rate/Rhythem: Sinus 67  BP:    Sitting: 148/60     SaO2: 98% Room Air  1300-1320 Matthew Sullivan without complaints or difficultly. Back to recliner with call bell within reach.  Patient encouraged to use IS.  Matthew Sullivan, Matthew Bruce RN BSN

## 2013-04-12 NOTE — Progress Notes (Addendum)
[address]       [place] [address]             [phone]      3 Days Post-Op  Procedure(s) (LRB): EXCISION OF ATRIAL MYXOMA (Left) INTRAOPERATIVE TRANSESOPHAGEAL ECHOCARDIOGRAM (N/A) Subjective: Feels fair, some discomfort with cough, sputum is clear  Objective  Telemetry sinus rhythm, one couplet noted   Temp:  [97.6 F (36.4 C)-98.5 F (36.9 C)] 97.6 F (36.4 C) (10/04 0536) Pulse Rate:  [61-72] 61 (10/04 0536) Resp:  [15-25] 18 (10/04 0536) BP: (88-116)/(48-66) 102/48 mmHg (10/04 0536) SpO2:  [91 %-100 %] 98 % (10/04 0536) Weight:  [222 lb 14.2 oz (101.1 kg)] 222 lb 14.2 oz (101.1 kg) (10/04 0536)   Intake/Output Summary (Last 24 hours) at 04/12/13 0736 Last data filed at 04/12/13 0500  Gross per 24 hour  Intake     40 ml  Output    756 ml  Net   -716 ml       General appearance: alert, cooperative and no distress Heart: regular rate and rhythm Lungs: min dim in bases Abdomen: benign Extremities: + BLE edema Wound: incis healing well  Lab Results:  Recent Labs  04/10/13 0420  04/10/13 1703 04/11/13 0448 04/12/13 0555  NA 139  < >  --  135 137  K 3.7  < >  --  3.9 4.3  CL 106  < >  --  100 100  CO2 23  --   --  26 25  GLUCOSE 153*  < >  --  131* 132*  BUN 16  < >  --  24* 33*  CREATININE 0.81  < > 0.90 0.86 1.18  CALCIUM 7.1*  --   --  7.6* 7.8*  MG 2.4  --  2.6*  --   --   < > = values in this interval not displayed. No results found for this basename: AST, ALT, ALKPHOS, BILITOT, PROT, ALBUMIN,  in the last 72 hours No results found for this basename: LIPASE, AMYLASE,  in the last 72 hours  Recent Labs  04/11/13 0448 04/12/13 0555  WBC 7.9 8.8  HGB 8.8* 8.9*  HCT 26.1* 26.3*  MCV 90.3 90.7  PLT 70* 101*   No results found for this basename: CKTOTAL, CKMB, TROPONINI,  in the last 72 hours No components found with this basename: POCBNP,  No results found for this basename: DDIMER,  in the last 72 hours No  results found for this basename: HGBA1C,  in the last 72 hours No results found for this basename: CHOL, HDL, LDLCALC, TRIG, CHOLHDL,  in the last 72 hours No results found for this basename: TSH, T4TOTAL, FREET3, T3FREE, THYROIDAB,  in the last 72 hours No results found for this basename: VITAMINB12, FOLATE, FERRITIN, TIBC, IRON, RETICCTPCT,  in the last 72 hours  Medications: Scheduled . acetaminophen  1,000 mg Oral Q6H   Or  . acetaminophen (TYLENOL) oral liquid 160 mg/5 mL  1,000 mg Per Tube Q6H  . aspirin EC  325 mg Oral Daily   Or  . aspirin  324 mg Per Tube Daily  . atorvastatin  40 mg Oral q1800  . bisacodyl  10 mg Oral Daily   Or  . bisacodyl  10 mg Rectal Daily  . docusate sodium  200 mg Oral Daily  . furosemide  40 mg Oral Daily  . insulin aspart  0-15 Units Subcutaneous TID WC  . pantoprazole  40 mg Oral Daily  . potassium chloride  20 mEq Oral BID  . sodium chloride  3 mL Intravenous Q12H     Radiology/Studies:  Dg Chest 2 View  04/12/2013   *RADIOLOGY REPORT*  Clinical Data: Evaluate atelectasis  CHEST - 2 VIEW  Comparison: 04/11/2013; 04/10/2013; 04/09/2013  Findings:  Grossly unchanged cardiac silhouette and mediastinal contours post median sternotomy.  Interval removal of right jugular approach vascular sheath. Grossly unchanged trace bilateral effusions with improved aeration of the lung bases with minimal persistent bibasilar opacities.  No new focal airspace opacity.  No evidence of edema.  No pneumothorax.  Grossly unchanged bones including sequela of lower cervical ACDF. A minimal amount of pneumomediastinum persists.  IMPRESSION: 1.  Improved aeration of the lung bases with minimal persistent atelectasis. 2.  Trace bilateral effusions, right greater than left, are grossly unchanged   Original Report Authenticated By: Tacey Ruiz, MD   Dg Chest Port 1 View  04/11/2013   CLINICAL DATA:  Post cardiac surgery.  EXAM: PORTABLE CHEST - 1 VIEW  COMPARISON:  04/10/2013   FINDINGS: The midline chest drain and Swan-Ganz catheter has been removed. Right jugular central line is still present. New densities at the left lung base are most compatible with atelectasis. There is no evidence for a pneumothorax. Heart size is roughly stable. Again noted is surgical hardware in the lower cervical spine.  IMPRESSION: Densities at the left lung base are suggestive for atelectasis.  Removal of support apparatuses as described.   Electronically Signed   By: Richarda Overlie M.D.   On: 04/11/2013 07:30    INR: Will add last result for INR, ABG once components are confirmed Will add last 4 CBG results once components are confirmed  Assessment/Plan: S/P Procedure(s) (LRB): EXCISION OF ATRIAL MYXOMA (Left) INTRAOPERATIVE TRANSESOPHAGEAL ECHOCARDIOGRAM (N/A)  1 cont pulm toilet/rehab, CXR improving atx/effus 2 sinus rhythm- monitor 3 sugars controlled on SSI, no DM preop 4 labs stable in regards to ABL anemia, lytes, renal function, platelets improved 5 some volume overload, cont gentle diuresis    LOS: 7 days    GOLD,WAYNE E 10/4/20147:36 AM  Patient seen and examined, agree with above Dc EPW Probably home tomorrow

## 2013-04-13 ENCOUNTER — Encounter (HOSPITAL_COMMUNITY): Payer: Self-pay | Admitting: Thoracic Surgery (Cardiothoracic Vascular Surgery)

## 2013-04-13 LAB — GLUCOSE, CAPILLARY: Glucose-Capillary: 117 mg/dL — ABNORMAL HIGH (ref 70–99)

## 2013-04-13 MED ORDER — OXYCODONE HCL 5 MG PO TABS: 5.0000 mg | ORAL_TABLET | ORAL | Status: DC | PRN

## 2013-04-13 MED ORDER — ASPIRIN 325 MG PO TBEC: 325.0000 mg | DELAYED_RELEASE_TABLET | Freq: Every day | ORAL | Status: DC

## 2013-04-13 NOTE — Discharge Summary (Signed)
[address]       [place] [address]             [phone]       Matthew Sullivan 1945/11/04 67 y.o. [id number]  04/05/2013   Loreli Slot, MD  Pure hypercholesterolemia [272.0] Benign hypertensive heart disease without heart failure [402.10] Syncope [780.2] HTN (hypertension) [401.9] Chest pressure [786.59] Near syncope [780.2]   HPI:  The patient is a 67 year old male with a history of hypertension and hyperlipidemia who presented to the emergency department for complaints of dizziness. Patient states that he is outside working in his yard doing some heavy lifting when he started to feel dizzy. At that point decided to sit down and his dizziness continued. He denied any chest pain or shortness of breath or palpitations. He states that he had a similar event approximately one month ago when walking his dog approximately 1-2 miles. Patient is very active. The patient also states that he did not feel very well and also had an episode of profuse sweating. He presented to the emergency department CT scan showed no acute findings. He was felt to require admission for further evaluation and treatment. During the evaluation he did note an elevated troponin. He had a peak troponin of 8 and cardiology consultation was obtained.  Hospital Course:   The patient was admitted through the emergency department. Cardiology consultation was obtained and he ruled in for non-ST segment elevation myocardial infarction. Cardiac catheterization was scheduled. He was found to have widely patent coronary arteries without obstructive coronary artery disease as well as normal left ventricular function. An echocardiogram was obtained and this revealed a very large atrial myxoma prolapsing in and out of the mitral outflow tract. Due to this finding cardiothoracic surgical consultation was obtained with Charlett Lango M.D. who evaluated the patient and agreed with recommendations  to proceed with surgical removal of the atrial myxoma. On 04/09/2013 the patient was taken the operating room at which time he underwent the following procedure:  DATE OF PROCEDURE: 04/09/2013  DATE OF DISCHARGE:  OPERATIVE REPORT  PREOPERATIVE DIAGNOSIS: Left atrial myxoma.  POSTOPERATIVE DIAGNOSIS: Left atrial myxoma.  PROCEDURE: Median sternotomy, extracorporeal circulation, excision of  left atrial myxoma, patch repair with core matrix, closure of patent  foramen ovale.  SURGEON: Salvatore Decent. Dorris Fetch, M.D.  ASSISTANT: Lowella Dandy, PA.  ANESTHESIA: General.  FINDINGS: Large left atrial myxoma prolapse through the mitral valve,  arising from atrial septum between the fossa ovalis and mitral anulus  attached by approximately 8 mm diameter stalk. Initial frozen section  of margins showed myxoma, additional tissue resected, final pathologic  margins negative. The patient remained hemodynamically stable  throughout the post bypass. He was transported from the operating room  to the surgical intensive care unit in good condition.  Postoperative hospital course:  The patient is on quite well. He is maintained stable hemodynamics and sinus rhythm. He was weaned from the ventilator without difficulty. All routine lines, monitors and drainage devices were discontinued in the standard fashion. He tolerating gradually increasing activities using standard protocols. Incisions were healing well without evidence of infection. Oxygen was weaned and he maintains good saturations on room air. He had a moderate acute blood loss anemia and values have stabilized. He had mild postoperative volume overload which improved over time. Overall at time of discharge she is felt to be quite stable.   Recent Labs  04/11/13 0448 04/12/13 0555  WBC 7.9 8.8  HGB 8.8* 8.9*  HCT 26.1* 26.3*  PLT 70* 101*   No results found for this basename: INR,  in the last 72 hours   Discharge Instructions:  The patient  is discharged to home with extensive instructions on wound care and progressive ambulation.  They are instructed not to drive or perform any heavy lifting until returning to see the physician in his office.  Discharge Diagnosis:  Pure hypercholesterolemia [272.0] Benign hypertensive heart disease without heart failure [402.10] Syncope [780.2] HTN (hypertension) [401.9] Chest pressure [786.59] Near syncope [780.2]  Secondary Diagnosis: Patient Active Problem List   Diagnosis Date Noted  . Myxoma of heart 04/07/2013  . NSTEMI  04/06/2013  . HTN (hypertension) 04/05/2013  . Syncope 04/05/2013  . Benign hypertensive heart disease without heart failure 06/22/2011  . Pure hypercholesterolemia 06/22/2011   Past Medical History  Diagnosis Date  . Hypertension   . Hyperlipidemia   . History of tinnitus   . Cervical osteoarthritis   . MI, old 7  . History of embolic stroke   . Stroke   . Melanoma      Medication List         aspirin 325 MG EC tablet  Take 1 tablet (325 mg total) by mouth daily.     atorvastatin 80 MG tablet  Commonly known as:  LIPITOR  Take 40 mg by mouth every evening.     celecoxib 200 MG capsule  Commonly known as:  CELEBREX  Take 200 mg by mouth daily as needed for pain.     hydrochlorothiazide 12.5 MG capsule  Commonly known as:  MICROZIDE  Take 12.5 mg by mouth every evening.     oxyCODONE 5 MG immediate release tablet  Commonly known as:  Oxy IR/ROXICODONE  Take 1-2 tablets (5-10 mg total) by mouth every 4 (four) hours as needed for pain.        Follow-up Information   Follow up with HENDRICKSON,STEVEN C, MD. (3 weeks-the office will contact you with an appointment. Please obtain a chest x-ray at [facility] imaging 1 hour prior to appointment. [facility] is located in the same office complex.)    Specialty:  Cardiothoracic Surgery   Contact information:   [address] [place] [address] [phone]        Follow up with Cassell Clement, MD. (2 weeks-please contact the office to arrange this appointment.)    Specialty:  Cardiology   Contact information:   [address] [place] [address] [phone]       Disposition:  Discharged home  Patient's condition is Good  Gershon Crane, PA-C 04/13/2013  10:03 AM

## 2013-04-13 NOTE — Progress Notes (Signed)
Pt ambulated 1000 ft in hallway unassisted on room air.  Pt tolerates ambulation without any issues.  Will continue to monitor. Matthew Sullivan

## 2013-04-13 NOTE — Progress Notes (Addendum)
[address]       [place] [address]             [phone]      4 Days Post-Op  Procedure(s) (LRB): EXCISION OF ATRIAL MYXOMA (Left) INTRAOPERATIVE TRANSESOPHAGEAL ECHOCARDIOGRAM (N/A) Subjective: Feels well  Objective  Telemetry sinus rhythm  Temp:  [98 F (36.7 C)-98.7 F (37.1 C)] 98.7 F (37.1 C) (10/05 0529) Pulse Rate:  [59-81] 59 (10/05 0529) Resp:  [18-20] 18 (10/05 0529) BP: (106-132)/(50-69) 106/62 mmHg (10/05 0529) SpO2:  [96 %-99 %] 96 % (10/05 0529) Weight:  [220 lb 1.6 oz (99.837 kg)] 220 lb 1.6 oz (99.837 kg) (10/05 0529)   Intake/Output Summary (Last 24 hours) at 04/13/13 0939 Last data filed at 04/13/13 0700  Gross per 24 hour  Intake    720 ml  Output   2575 ml  Net  -1855 ml       General appearance: alert, cooperative and no distress Heart: regular rate and rhythm Lungs: clear to auscultation bilaterally Abdomen: benign Extremities: + edema- minor Wound: incisions healing well  Lab Results:  Recent Labs  04/10/13 1703 04/11/13 0448 04/12/13 0555  NA  --  135 137  K  --  3.9 4.3  CL  --  100 100  CO2  --  26 25  GLUCOSE  --  131* 132*  BUN  --  24* 33*  CREATININE 0.90 0.86 1.18  CALCIUM  --  7.6* 7.8*  MG 2.6*  --   --    No results found for this basename: AST, ALT, ALKPHOS, BILITOT, PROT, ALBUMIN,  in the last 72 hours No results found for this basename: LIPASE, AMYLASE,  in the last 72 hours  Recent Labs  04/11/13 0448 04/12/13 0555  WBC 7.9 8.8  HGB 8.8* 8.9*  HCT 26.1* 26.3*  MCV 90.3 90.7  PLT 70* 101*   No results found for this basename: CKTOTAL, CKMB, TROPONINI,  in the last 72 hours No components found with this basename: POCBNP,  No results found for this basename: DDIMER,  in the last 72 hours No results found for this basename: HGBA1C,  in the last 72 hours No results found for this basename: CHOL, HDL, LDLCALC, TRIG, CHOLHDL,  in the last 72 hours No results found for this  basename: TSH, T4TOTAL, FREET3, T3FREE, THYROIDAB,  in the last 72 hours No results found for this basename: VITAMINB12, FOLATE, FERRITIN, TIBC, IRON, RETICCTPCT,  in the last 72 hours  Medications: Scheduled . acetaminophen  1,000 mg Oral Q6H   Or  . acetaminophen (TYLENOL) oral liquid 160 mg/5 mL  1,000 mg Per Tube Q6H  . aspirin EC  325 mg Oral Daily   Or  . aspirin  324 mg Per Tube Daily  . atorvastatin  40 mg Oral q1800  . bisacodyl  10 mg Oral Daily   Or  . bisacodyl  10 mg Rectal Daily  . docusate sodium  200 mg Oral Daily  . furosemide  40 mg Oral Daily  . insulin aspart  0-15 Units Subcutaneous TID WC  . pantoprazole  40 mg Oral Daily  . potassium chloride  20 mEq Oral BID  . sodium chloride  3 mL Intravenous Q12H     Radiology/Studies:  Dg Chest 2 View  04/12/2013   *RADIOLOGY REPORT*  Clinical Data: Evaluate atelectasis  CHEST - 2 VIEW  Comparison: 04/11/2013; 04/10/2013; 04/09/2013  Findings:  Grossly unchanged cardiac silhouette  and mediastinal contours post median sternotomy.  Interval removal of right jugular approach vascular sheath. Grossly unchanged trace bilateral effusions with improved aeration of the lung bases with minimal persistent bibasilar opacities.  No new focal airspace opacity.  No evidence of edema.  No pneumothorax.  Grossly unchanged bones including sequela of lower cervical ACDF. A minimal amount of pneumomediastinum persists.  IMPRESSION: 1.  Improved aeration of the lung bases with minimal persistent atelectasis. 2.  Trace bilateral effusions, right greater than left, are grossly unchanged   Original Report Authenticated By: Tacey Ruiz, MD    INR: Will add last result for INR, ABG once components are confirmed Will add last 4 CBG results once components are confirmed  Assessment/Plan: S/P Procedure(s) (LRB): EXCISION OF ATRIAL MYXOMA (Left) INTRAOPERATIVE TRANSESOPHAGEAL ECHOCARDIOGRAM (N/A) Plan for discharge: see discharge orders Doing  very well  LOS: 8 days    GOLD,WAYNE E 10/5/20149:39 AM  Patient seen and examined, agree with above Dc home

## 2013-04-14 LAB — HEPARIN INDUCED THROMBOCYTOPENIA PNL
Heparin Induced Plt Ab: NEGATIVE
UFH Low Dose 0.1 IU/mL: 0 % Release
UFH SRA Result: NEGATIVE

## 2013-04-17 ENCOUNTER — Telehealth: Payer: Self-pay | Admitting: *Deleted

## 2013-04-17 NOTE — Telephone Encounter (Signed)
Spoke with patient regarding episode he had night before last. He had done his breathing exercise and felt like phlegm loosened up and cause him to have difficulty breathing. Patient stated this caused him to pass out for a few seconds. Stated after he woke up felt fine. Patient denies any increased shortness of breath or chest pain. Patient does get a little short of breath with exertion, nothing different than he was having before episode.  He stated he was feeling good and doing quite well. Schedule ov with Dawayne Patricia NP for tomorrow.

## 2013-04-18 ENCOUNTER — Ambulatory Visit (HOSPITAL_COMMUNITY): Payer: Medicare Other | Attending: Cardiology | Admitting: Radiology

## 2013-04-18 ENCOUNTER — Ambulatory Visit (INDEPENDENT_AMBULATORY_CARE_PROVIDER_SITE_OTHER): Payer: Medicare Other | Admitting: Nurse Practitioner

## 2013-04-18 ENCOUNTER — Encounter: Payer: Self-pay | Admitting: Nurse Practitioner

## 2013-04-18 ENCOUNTER — Telehealth: Payer: Self-pay | Admitting: *Deleted

## 2013-04-18 ENCOUNTER — Encounter (INDEPENDENT_AMBULATORY_CARE_PROVIDER_SITE_OTHER): Payer: Medicare Other

## 2013-04-18 VITALS — BP 130/80 | HR 144 | Ht 72.0 in | Wt 215.4 lb

## 2013-04-18 DIAGNOSIS — I214 Non-ST elevation (NSTEMI) myocardial infarction: Secondary | ICD-10-CM | POA: Diagnosis not present

## 2013-04-18 DIAGNOSIS — I4891 Unspecified atrial fibrillation: Secondary | ICD-10-CM

## 2013-04-18 DIAGNOSIS — I319 Disease of pericardium, unspecified: Secondary | ICD-10-CM

## 2013-04-18 DIAGNOSIS — R55 Syncope and collapse: Secondary | ICD-10-CM | POA: Diagnosis not present

## 2013-04-18 LAB — BASIC METABOLIC PANEL
BUN: 14 mg/dL (ref 6–23)
CO2: 30 mEq/L (ref 19–32)
Calcium: 8.9 mg/dL (ref 8.4–10.5)
Chloride: 100 mEq/L (ref 96–112)
Creatinine, Ser: 0.9 mg/dL (ref 0.4–1.5)
GFR: 85.66 mL/min (ref >60.00)
Glucose, Bld: 112 mg/dL — ABNORMAL HIGH (ref 70–99)
Potassium: 3.9 mEq/L (ref 3.5–5.1)
Sodium: 140 mEq/L (ref 135–145)

## 2013-04-18 LAB — CBC WITH DIFFERENTIAL/PLATELET
Basophils Absolute: 0 10*3/uL (ref 0.0–0.1)
Basophils Relative: 0 % (ref 0.0–3.0)
Eosinophils Absolute: 0.1 10*3/uL (ref 0.0–0.7)
Eosinophils Relative: 0.8 % (ref 0.0–5.0)
HCT: 27.7 % — ABNORMAL LOW (ref 39.0–52.0)
Hemoglobin: 9.1 g/dL — ABNORMAL LOW (ref 13.0–17.0)
Lymphocytes Relative: 12.3 % (ref 12.0–46.0)
Lymphs Abs: 1.1 10*3/uL (ref 0.7–4.0)
MCHC: 32.9 g/dL (ref 30.0–36.0)
MCV: 90.2 fl (ref 78.0–100.0)
Monocytes Absolute: 0.8 10*3/uL (ref 0.1–1.0)
Monocytes Relative: 8.6 % (ref 3.0–12.0)
Neutro Abs: 6.8 10*3/uL (ref 1.4–7.7)
Neutrophils Relative %: 78.3 % — ABNORMAL HIGH (ref 43.0–77.0)
Platelets: 452 10*3/uL — ABNORMAL HIGH (ref 150.0–400.0)
RBC: 3.07 Mil/uL — ABNORMAL LOW (ref 4.22–5.81)
RDW: 13.7 % (ref 11.5–14.6)
WBC: 8.7 10*3/uL (ref 4.5–10.5)

## 2013-04-18 MED ORDER — METOPROLOL TARTRATE 25 MG PO TABS: 25.0000 mg | ORAL_TABLET | Freq: Two times a day (BID) | ORAL | Status: DC

## 2013-04-18 MED ORDER — RIVAROXABAN 20 MG PO TABS: 20.0000 mg | ORAL_TABLET | Freq: Every day | ORAL | Status: DC

## 2013-04-18 NOTE — Telephone Encounter (Signed)
1:30pm central time ecardio recording baseline today of afib @160  bpm for 12 sec. Per Cathe Mons with [name] NP that saw him today, advised to call pt and make sure he gets both doses of metoprolol in today and she was aware he was afib/rvr. Both numbers called and a msg was left on both numbers to get both doses of metoprolol in today.

## 2013-04-18 NOTE — Progress Notes (Signed)
Limited Echocardiogram performed.  

## 2013-04-18 NOTE — Patient Instructions (Addendum)
We are adding Lopressor 25 mg two times a day - this is at [employer]  We are adding Xarelto 20 mg a day - use the samples  We will check labs today  We need to do a limited echo  I will see you on Monday   Minimize your activities for now  Call the [facility] office at [phone] if you have any questions, problems or concerns.

## 2013-04-18 NOTE — Progress Notes (Signed)
[name] Date of Birth: 12-07-1945 Medical Record #[id number]  History of Present Illness: [name] is seen back today for a work in visit. Seen for Dr. [name]. Has a history of HTn and HLD.   Most recently presented to the hospital at the end of September with a dizzy spell. His evaluation showed an elevated troponin consistent with NSTEMI. Cardiac cath was performed and he has normal coronaries. Echo showed a very large atrial myxoma prolapsing in and out of the mitral outflow track. Seen by TCTS and was operated on 04/09/2013 by Dr. Dorris Fetch. Post op course was uncomplicated except for anemia. Discharged 04/13/2013.   Called yesterday about a spell he had 2 nights ago - was doing his breathing exercises - felt like phlegm was coming up - made him short of breath - says he passed out for a few seconds. Thus added to my schedule for today. Little fever right after he got home - but none over the past 3 days.   Comes in today. Here with his wife. He says he feels "fantastic". Some mild dyspnea. No chest pain. No real sense of palpitations but thinks that when he laid on his side that he could feel his heart beating "funny". The spell that he had 2 nights ago consisted of no real cough but he "held his breath" to prevent coughing. No sense of palpitations at that time as well.   Current Outpatient Prescriptions  Medication Sig Dispense Refill  . acetaminophen (TYLENOL) 325 MG tablet Take 650 mg by mouth every 6 (six) hours as needed for pain.      Marland Kitchen aspirin EC 325 MG EC tablet Take 1 tablet (325 mg total) by mouth daily.  30 tablet  0  . atorvastatin (LIPITOR) 80 MG tablet Take 40 mg by mouth every evening.      . celecoxib (CELEBREX) 200 MG capsule Take 200 mg by mouth daily as needed for pain.      . hydrochlorothiazide (MICROZIDE) 12.5 MG capsule Take 12.5 mg by mouth every evening.       No current facility-administered medications for this visit.    No Known  Allergies  Past Medical History  Diagnosis Date  . Hypertension   . Hyperlipidemia   . History of tinnitus   . Cervical osteoarthritis   . MI, old 16  . History of embolic stroke   . Stroke   . Melanoma   . Atrial myxoma     Past Surgical History  Procedure Laterality Date  . Back surgery    . Appendectomy    . Nose surgery    . Nasal septum surgery      History  Smoking status  . Former Smoker  . Quit date: 07/10/1977  Smokeless tobacco  . Not on file    History  Alcohol Use No    Family History  Problem Relation Age of Onset  . Cancer Father     Review of Systems: The review of systems is per the HPI.  All other systems were reviewed and are negative.  Physical Exam: BP 130/80  Pulse 144  Ht 6' (1.829 m)  Wt 215 lb 6.4 oz (97.705 kg)  BMI 29.21 kg/m2 Patient is very pleasant and in no acute distress. Skin is warm and dry. Color is normal.  HEENT is unremarkable. Normocephalic/atraumatic. PERRL. Sclera are nonicteric. Neck is supple. No masses. No JVD. Lungs are clear. Cardiac exam shows an irregular rate and rhythm.Rate  is fast. Sternum looks ok.  Abdomen is soft. Extremities are without edema. Gait and ROM are intact. No gross neurologic deficits noted.  LABORATORY DATA: EKG today shows atrial fib with RVR - rate of 144.   Lab Results  Component Value Date   WBC 8.8 04/12/2013   HGB 8.9* 04/12/2013   HCT 26.3* 04/12/2013   PLT 101* 04/12/2013   GLUCOSE 132* 04/12/2013   CHOL 125 03/21/2013   TRIG 65.0 03/21/2013   HDL 42.50 03/21/2013   LDLCALC 70 03/21/2013   ALT 24 03/21/2013   AST 27 03/21/2013   NA 137 04/12/2013   K 4.3 04/12/2013   CL 100 04/12/2013   CREATININE 1.18 04/12/2013   BUN 33* 04/12/2013   CO2 25 04/12/2013   TSH 3.06 02/01/2012   INR 1.31 04/09/2013   HGBA1C 5.6 04/09/2013     Assessment / Plan: 1. Syncope - - hard to say if this was from holding his breath/cough/vagal reaction - he is in atrial fib today and was unaware - he had had  some rare dizzy spells before this myxoma was ever noted - could potentially have 2 issues going on - I have discussed his case with Dr. Jens Som who has subsequently seen him with me.  2. Recent removal of a left atrial myxoma - was anemic post op - no real complication noted. Sternum looks ok.   3. HTN - BP is ok.   4. Post op anemia - recheck his labs today  5. New onset atrial fib - with RVR - will add Lopressor 25 mg BID - start Xarelto 20 mg a day - Check limited echo today. I will see back on Monday. Placing event monitor as well - he has had syncope 2 days ago - rare dizzy spells - noted to have SVT during his cardiac cath.   Patient is agreeable to this plan and will call if any problems develop in the interim.   Matthew Macadamia, RN, ANP-C [facility] [address] [place], [place]  [address]

## 2013-04-21 ENCOUNTER — Encounter (HOSPITAL_COMMUNITY): Payer: Self-pay | Admitting: Thoracic Surgery (Cardiothoracic Vascular Surgery)

## 2013-04-21 ENCOUNTER — Telehealth: Payer: Self-pay | Admitting: *Deleted

## 2013-04-21 ENCOUNTER — Ambulatory Visit (INDEPENDENT_AMBULATORY_CARE_PROVIDER_SITE_OTHER): Payer: Medicare Other | Admitting: Nurse Practitioner

## 2013-04-21 VITALS — BP 114/70 | HR 130 | Ht 72.0 in | Wt 208.4 lb

## 2013-04-21 DIAGNOSIS — I4891 Unspecified atrial fibrillation: Secondary | ICD-10-CM | POA: Diagnosis not present

## 2013-04-21 MED ORDER — DILTIAZEM HCL ER COATED BEADS 240 MG PO CP24: 240.0000 mg | ORAL_CAPSULE | Freq: Every day | ORAL | Status: DC

## 2013-04-21 NOTE — OR Nursing (Signed)
LATE ENTRY: Entered missing out of room time

## 2013-04-21 NOTE — Progress Notes (Signed)
Matthew Sullivan Date of Birth: 1945-08-15 Medical Record #[id number]  History of Present Illness: Matthew Sullivan is seen back today for a 3 day check. Seen for Dr. [name]. Has a history of HTN and HLD.   Most recently presented to the hospital at the end of September with a dizzy spell. His evaluation showed an elevated troponin consistent with NSTEMI. Cardiac cath was performed and he has normal coronaries. Echo showed a very large atrial myxoma prolapsing in and out of the mitral outflow track. Seen by TCTS and was operated on 04/09/2013 by Dr. Dorris Fetch. Post op course was uncomplicated except for anemia. Discharged 04/13/2013.   Seen on Friday after having had a syncopal spell - was doing his breathing exercises - felt like phlegm was coming up - made him short of breath and he was holding his breath to try and not cough - says he passed out for a few seconds.  No real sense of palpitations but was noted to be in atrial fib at the time of my visit. We started Lopressor and Xarelto. We also placed an event monitor. Limited echo was also done.   Comes back today. Here with his wife. Little frustrated but doing ok. Not aware of the atrial fib. The lopressor makes him tired. Not short of breath. No syncope. Has his event monitor in place - one spell of what looked to be VT - but was reviewed with Dr. Graciela Husbands this morning and it is his feeling it was artifact.   Current Outpatient Prescriptions  Medication Sig Dispense Refill  . aspirin EC 325 MG EC tablet Take 1 tablet (325 mg total) by mouth daily.  30 tablet  0  . atorvastatin (LIPITOR) 80 MG tablet Take 40 mg by mouth every evening.      . celecoxib (CELEBREX) 200 MG capsule Take 200 mg by mouth daily as needed for pain.      . hydrochlorothiazide (MICROZIDE) 12.5 MG capsule Take 12.5 mg by mouth every evening.      . metoprolol tartrate (LOPRESSOR) 25 MG tablet Take 1 tablet (25 mg total) by mouth 2 (two) times daily.  60 tablet  3  .  Rivaroxaban (XARELTO) 20 MG TABS tablet Take 1 tablet (20 mg total) by mouth daily with supper.  30 tablet     No current facility-administered medications for this visit.    No Known Allergies  Past Medical History  Diagnosis Date  . Hypertension   . Hyperlipidemia   . History of tinnitus   . Cervical osteoarthritis   . MI, old 62  . History of embolic stroke   . Stroke   . Melanoma   . Atrial myxoma     Past Surgical History  Procedure Laterality Date  . Back surgery    . Appendectomy    . Nose surgery    . Nasal septum surgery    . Excision of atrial myxoma Left 04/09/2013    Procedure: EXCISION OF ATRIAL MYXOMA;  Surgeon: Loreli Slot, MD;  Location: [facility] OR;  Service: Open Heart Surgery;  Laterality: Left;  . Intraoperative transesophageal echocardiogram N/A 04/09/2013    Procedure: INTRAOPERATIVE TRANSESOPHAGEAL ECHOCARDIOGRAM;  Surgeon: Loreli Slot, MD;  Location: [facility] OR;  Service: Open Heart Surgery;  Laterality: N/A;    History  Smoking status  . Former Smoker  . Quit date: 07/10/1977  Smokeless tobacco  . Not on file    History  Alcohol Use No    Family History  Problem Relation Age of Onset  . Cancer Father     Review of Systems: The review of systems is per the HPI.  All other systems were reviewed and are negative.  Physical Exam: BP 114/70  Pulse 130  Ht 6' (1.829 m)  Wt 208 lb 6.4 oz (94.53 kg)  BMI 28.26 kg/m2 Patient is very pleasant and in no acute distress. Skin is warm and dry. Color is normal.  HEENT is unremarkable. Normocephalic/atraumatic. PERRL. Sclera are nonicteric. Neck is supple. No masses. No JVD. Lungs are clear. Cardiac exam shows a regular rate and rhythm. Abdomen is soft. Extremities are without edema. Gait and ROM are intact. No gross neurologic deficits noted.  LABORATORY DATA:  EKG today shows AF with RVR - rate 130's today  Lab Results  Component Value Date   WBC 8.7 04/18/2013   HGB 9.1* 04/18/2013     HCT 27.7* 04/18/2013   PLT 452.0* 04/18/2013   GLUCOSE 112* 04/18/2013   CHOL 125 03/21/2013   TRIG 65.0 03/21/2013   HDL 42.50 03/21/2013   LDLCALC 70 03/21/2013   ALT 24 03/21/2013   AST 27 03/21/2013   NA 140 04/18/2013   K 3.9 04/18/2013   CL 100 04/18/2013   CREATININE 0.9 04/18/2013   BUN 14 04/18/2013   CO2 30 04/18/2013   TSH 3.06 02/01/2012   INR 1.31 04/09/2013   HGBA1C 5.6 04/09/2013   Echo Study Conclusions from Friday, October 10th, 2014  - Left ventricle: Systolic function was mildly reduced. The estimated ejection fraction was in the range of 45% to 50%. Diffuse hypokinesis. - Left atrium: The atrium was mildly dilated. - Pericardium, extracardiac: A small pericardial effusion was identified.   Assessment / Plan: 1. AF - rate still not controlled - have stopped his HCTZ and added Diltiazem 240 mg a day to help control his rate better - will continue Xarelto - will try to keep him in samples - he is not going to be able to afford long term.   2. Recent removal of left atrial myxoma  3. Mildly reduced LV function - this was in the setting of atrial fib with RVR - may be more a reflection of his tachycardia - will need repeat echo once we restore NSR.  4. HTN - BP is fine. HCTZ is stopped with the addition of CCB to control his rate.   Medicines are changed as above. See Dr. [name] back next week as planned.   Patient is agreeable to this plan and will call if any problems develop in the interim.   Rosalio Macadamia, RN, ANP-C [facility] [address] [place], [place]  [address]

## 2013-04-21 NOTE — Patient Instructions (Addendum)
Stop your HCTZ  I am adding Diltiazem 240 mg a day to help control your rate - this is at [facility] on your other medicines  See Dr. [name] next week as planned  Call the [facility] office at [phone] if you have any questions, problems or concerns.

## 2013-04-21 NOTE — Telephone Encounter (Signed)
E-Cardio strips received with documentation from 04/20/13 ~4:55 pm. Strips note VT for a duration of 22 seconds. Reviewed with [name], NP to confirm she was not aware of these tracings. Presented strips to Dr. Graciela Husbands to review per Dr. Jens Som. Dr. Graciela Husbands confirms artifact. I called the patient to confirm if he was symptomatic at the time. He confirms no symptoms were present. He is scheduled to follow up with [name], NP at 10:30 am this morning. He confirms he is coming to this appointment and will discuss monitor further with Lawson Fiscal.

## 2013-04-29 ENCOUNTER — Ambulatory Visit (INDEPENDENT_AMBULATORY_CARE_PROVIDER_SITE_OTHER): Payer: Medicare Other | Admitting: Cardiology

## 2013-04-29 ENCOUNTER — Encounter: Payer: Self-pay | Admitting: Cardiology

## 2013-04-29 VITALS — BP 118/74 | HR 80 | Ht 72.0 in | Wt 206.0 lb

## 2013-04-29 DIAGNOSIS — D151 Benign neoplasm of heart: Secondary | ICD-10-CM

## 2013-04-29 DIAGNOSIS — I10 Essential (primary) hypertension: Secondary | ICD-10-CM | POA: Diagnosis not present

## 2013-04-29 DIAGNOSIS — I119 Hypertensive heart disease without heart failure: Secondary | ICD-10-CM | POA: Diagnosis not present

## 2013-04-29 DIAGNOSIS — I4891 Unspecified atrial fibrillation: Secondary | ICD-10-CM

## 2013-04-29 DIAGNOSIS — I480 Paroxysmal atrial fibrillation: Secondary | ICD-10-CM | POA: Insufficient documentation

## 2013-04-29 DIAGNOSIS — R55 Syncope and collapse: Secondary | ICD-10-CM

## 2013-04-29 MED ORDER — METOPROLOL TARTRATE 50 MG PO TABS: 50.0000 mg | ORAL_TABLET | Freq: Two times a day (BID) | ORAL | Status: DC

## 2013-04-29 NOTE — Assessment & Plan Note (Signed)
The patient is on diltiazem and Lopressor for rate control.  His heart rate is still excessive and we will increase his Lopressor up to 50 mg twice a day.  The patient is wearing an event monitor.  So far he has been remaining in atrial fibrillation.  If after approximately a month he has not reestablished normal sinus rhythm we will consider elective outpatient cardioversion.  Now that he is on Xarelto his aspirin which he was on postoperatively has been stopped.

## 2013-04-29 NOTE — Assessment & Plan Note (Signed)
Blood pressure is remaining in satisfactory range.  It is adequate and allows [place] to try increasing his metoprolol for better rate control of his atrial fib

## 2013-04-29 NOTE — Patient Instructions (Addendum)
INCREASE YOUR LOPRESSOR (METOPROLOL) TO 50 MG TWICE A DAY, NEW RX SENT TO PHARMACY  STOP YOUR ASPIRIN  Follow up 1 month
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
## 2013-04-29 NOTE — Assessment & Plan Note (Signed)
The patient has had no further episodes of dizziness or near syncope since his initial presentation in atrial fibrillation on 04/18/13

## 2013-05-02 ENCOUNTER — Other Ambulatory Visit: Payer: Self-pay | Admitting: *Deleted

## 2013-05-02 ENCOUNTER — Encounter: Payer: Self-pay | Admitting: Cardiology

## 2013-05-02 DIAGNOSIS — D151 Benign neoplasm of heart: Secondary | ICD-10-CM

## 2013-05-06 ENCOUNTER — Ambulatory Visit
Admission: RE | Admit: 2013-05-06 | Discharge: 2013-05-06 | Disposition: A | Discharge status: Home / Self Care | Payer: Medicare Other | Source: Ambulatory Visit | Attending: Thoracic Surgery (Cardiothoracic Vascular Surgery) | Admitting: Thoracic Surgery (Cardiothoracic Vascular Surgery)

## 2013-05-06 ENCOUNTER — Ambulatory Visit (INDEPENDENT_AMBULATORY_CARE_PROVIDER_SITE_OTHER): Payer: Self-pay | Admitting: Thoracic Surgery (Cardiothoracic Vascular Surgery)

## 2013-05-06 ENCOUNTER — Encounter: Payer: Self-pay | Admitting: Thoracic Surgery (Cardiothoracic Vascular Surgery)

## 2013-05-06 VITALS — BP 108/75 | HR 83 | Resp 16 | Ht 72.0 in | Wt 200.0 lb

## 2013-05-06 DIAGNOSIS — I4891 Unspecified atrial fibrillation: Secondary | ICD-10-CM

## 2013-05-06 DIAGNOSIS — D151 Benign neoplasm of heart: Secondary | ICD-10-CM

## 2013-05-06 DIAGNOSIS — Z09 Encounter for follow-up examination after completed treatment for conditions other than malignant neoplasm: Secondary | ICD-10-CM

## 2013-05-06 DIAGNOSIS — J9819 Other pulmonary collapse: Secondary | ICD-10-CM | POA: Diagnosis not present

## 2013-05-06 DIAGNOSIS — J90 Pleural effusion, not elsewhere classified: Secondary | ICD-10-CM | POA: Diagnosis not present

## 2013-05-06 NOTE — Progress Notes (Signed)
HPI:  [name] turns today for a scheduled postoperative followup visit. He is a 67 year old gentleman who presented with a presyncopal spell in September. He was found to have a large left atrial myxoma. He underwent resection of the left atrial myxoma on October 1. He did well postoperatively without any significant arrhythmias and was discharged on postoperative day #4.  After discharge he had another presyncopal/syncopal spell. He saw [name] and was found to be in atrial fibrillation. He was started on Cardizem and Xarelto. He followed up with Dr. [name] and was still in atrial fibrillation with a relatively rapid rate. His Lopressor was increased to 50 mg BID.  He says that since his Lopressor was increased that he has felt fatigued. He had questions about reducing that dose.  He is walking about mild to mild half a day. He has minimal incisional discomfort and is only using Tylenol for pain. He has not had any further dizzy spells since the episode that led to the discovery of the atrial fibrillation.  Past Medical History  Diagnosis Date  . Hypertension   . Hyperlipidemia   . History of tinnitus   . Cervical osteoarthritis   . MI, old 32  . History of embolic stroke   . Stroke   . Melanoma   . Atrial myxoma       Current Outpatient Prescriptions  Medication Sig Dispense Refill  . aspirin EC 325 MG EC tablet Take 1 tablet (325 mg total) by mouth daily.  30 tablet  0  . atorvastatin (LIPITOR) 80 MG tablet Take 40 mg by mouth every evening.      . celecoxib (CELEBREX) 200 MG capsule Take 200 mg by mouth daily as needed for pain.      Marland Kitchen diltiazem (CARDIZEM CD) 240 MG 24 hr capsule Take 1 capsule (240 mg total) by mouth daily.  30 capsule  6  . metoprolol tartrate (LOPRESSOR) 50 MG tablet Take 1 tablet (50 mg total) by mouth 2 (two) times daily.  60 tablet  5  . Rivaroxaban (XARELTO) 20 MG TABS tablet Take 1 tablet (20 mg total) by mouth daily with supper.  30 tablet      No current facility-administered medications for this visit.    Physical Exam BP 108/75  Pulse 83  Resp 16  Ht 6' (1.829 m)  Wt 200 lb (90.719 kg)  BMI 27.12 kg/m2  SpO26 4% 67 year old male in no acute distress Neurologic alert and oriented x3, no deficits Cardiac irregularly irregular, no murmur Lungs slightly diminished at left base otherwise clear No peripheral edema Sternum stable, incision clean dry and intact   Diagnostic Tests: Chest x-ray 05/06/2013 EXAM:  CHEST 2 VIEW  COMPARISON: Chest x-ray of 04/12/2013  FINDINGS:  The previous small left effusion has increased slightly in volume  with increase in left basilar atelectasis. The right lung is clear,  and the prior small right effusion has resolved. Cardiomegaly is  stable. Median sternotomy sutures are noted as well as an anterior  cervical spine fusion plate.  IMPRESSION:  Increase in small left pleural effusion with increase in left  basilar atelectasis.  Electronically Signed  By: Dwyane Dee M.D.  On: 05/06/2013 10:33   Impression: 67 year old man who is now about a month out from resection of left atrial myxoma. His initial postoperative course was uncomplicated. He did develop atrial fibrillation after discharge. He currently is on diltiazem and Lopressor for that. He has been anticoagulated with Xarelto. Today he is  in atrial fibrillation, but his rate is varying between 80 and 100. He asked if we can decrease his Lopressor dose because he feels it is making him feel tired and fatigued. I recommended that we not change the dose now. He may adapt to this dose or it may be able to be reduced later once he is out of atrial fibrillation.  From a surgical standpoint he's doing well. He is having minimal discomfort. His exercise tolerance is good, particularly considering that is in atrial fibrillation. He may begin driving. Appropriate precautions were discussed. He is not to lift anything over 10 pounds for  another 2 weeks. He should not play golf for at least another month.   Plan: He will followup with Dr. [name] in about 3 weeks.  I will plan to see him back in one year with a transthoracic echo. He will need be followed with periodic echos to ensure there is no recurrence.

## 2013-05-15 ENCOUNTER — Telehealth: Payer: Self-pay

## 2013-05-15 NOTE — Telephone Encounter (Signed)
New problem    Upcoming dental appt on  11/13 @ 11:30   Please advise on pre-med's   Clearance on tooth extraction.

## 2013-05-15 NOTE — Telephone Encounter (Signed)
I called and spoke with Delice Bison at Dr. Louie Boston office. The patient will be under local anesthesia for tooth extraction. I advised her that he is on xarelto. She states that Dr. Azucena Cecil mentioned that in his note, but did not clarify if that will need to be held. I advised her it usually is only for about 48 hours prior if needed. She will clarify with Dr. Azucena Cecil. I will forward to [name], NP to review in Dr. Yevonne Pax absence. Will clarify with Lawson Fiscal and see her thoughts on hold xarelto if needed. The patient also hold the excision of a left atrial myxoma on 04/05/13- unclear if pre-medication may be needed for this.

## 2013-05-16 NOTE — Telephone Encounter (Signed)
Patient needs to remain on his Xarelto due to possible upcoming cardioversion. If he needs to stop he will have to start all over with the 4 weeks of anticoagulation.   Please call the dentist and the patient.

## 2013-05-16 NOTE — Telephone Encounter (Signed)
S/w pt is aware can not stop xarelto for upcoming cardioversion and that pt has to stay on anticoagulation for 4 continuous weeks but has any angry tooth and is scheduled to get the tooth out 11/13. Also aware if pt does get tooth out to stop xarelto 48 hours prior does not want to do either procedure but has no choice will let us know what pt decides and call us. Pt aware and agreeable to plan

## 2013-05-20 NOTE — Telephone Encounter (Addendum)
Dr Louie Boston office called inquiring if the pt would require SBE prior to the tooth extraction. Discussed with lori gerhardt np, yes the pt will need SBE prior to procedure. Dr burton's office made aware. Left message for pt to call to confirm pharm and call in script

## 2013-05-21 ENCOUNTER — Encounter (HOSPITAL_COMMUNITY): Payer: Self-pay | Admitting: Thoracic Surgery (Cardiothoracic Vascular Surgery)

## 2013-05-21 ENCOUNTER — Telehealth: Payer: Self-pay | Admitting: Cardiology

## 2013-05-21 NOTE — Telephone Encounter (Signed)
Spoke w/ pt.  States he has decided not to have tooth extracted now because he doesn't want to have to start all over with Xarelto.  States Dr. Azucena Cecil has given him Amoxicillin to take and tooth has "calmed down"-is not bothering him now.  He will notify Dr. Louie Boston office.

## 2013-05-21 NOTE — Telephone Encounter (Signed)
New message     Returning a nurses call.  Probably calling about his tooth extraction.

## 2013-05-21 NOTE — OR Nursing (Signed)
LATE ENTRY: Entered procedure end time 

## 2013-06-02 NOTE — Telephone Encounter (Signed)
See telephone note 05-21-13

## 2013-06-03 ENCOUNTER — Other Ambulatory Visit: Payer: Self-pay | Admitting: Cardiology

## 2013-06-04 ENCOUNTER — Ambulatory Visit (INDEPENDENT_AMBULATORY_CARE_PROVIDER_SITE_OTHER): Payer: Medicare Other | Admitting: Cardiology

## 2013-06-04 ENCOUNTER — Encounter: Payer: Self-pay | Admitting: Cardiology

## 2013-06-04 VITALS — BP 117/77 | HR 111 | Ht 72.0 in | Wt 205.0 lb

## 2013-06-04 DIAGNOSIS — I4891 Unspecified atrial fibrillation: Secondary | ICD-10-CM | POA: Diagnosis not present

## 2013-06-04 DIAGNOSIS — I119 Hypertensive heart disease without heart failure: Secondary | ICD-10-CM

## 2013-06-04 DIAGNOSIS — I480 Paroxysmal atrial fibrillation: Secondary | ICD-10-CM

## 2013-06-04 MED ORDER — ATORVASTATIN CALCIUM 80 MG PO TABS: 40.0000 mg | ORAL_TABLET | Freq: Every evening | ORAL | Status: DC

## 2013-06-04 MED ORDER — AMIODARONE HCL 200 MG PO TABS: 200.0000 mg | ORAL_TABLET | Freq: Every day | ORAL | Status: DC

## 2013-06-04 NOTE — Assessment & Plan Note (Signed)
The patient has been on Xarelto for an adequate length of time.  We are searching for a date for cardioversion.  The best date that will work for him is Wednesday, December 17 and it has been scheduled for 11 AM on that date. He will take only his amiodarone and his metoprolol on the morning of the procedure.

## 2013-06-04 NOTE — Progress Notes (Signed)
Matthew Sullivan Date of Birth:  Mar 29, 1946 [address] [place], [place]  [address] [phone]         Fax   [phone]  History of Present Illness: This pleasant 67 year old gentleman is seen for a scheduled followup office visit.  He has a history of left atrial myxoma and underwent successful surgical removal by Dr. Dorris Fetch on 04/09/13.  He was in normal sinus rhythm at discharge.  After returning home the patient went into atrial fibrillation with rapid ventricular response.  He has been on Xarelto since 04/18/13.  He has not been experiencing any TIA symptoms.  Is not having any bleeding problems.  His heart rate has improved with the addition of diltiazem and metoprolol.  He has been walking a mile to a mile and a half a day for exercise.  He is not doing any heavy physical work yet.  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  Current Outpatient Prescriptions  Medication Sig Dispense Refill  . atorvastatin (LIPITOR) 80 MG tablet Take 0.5 tablets (40 mg total) by mouth every evening.  90 tablet  3  . diltiazem (CARDIZEM CD) 240 MG 24 hr capsule Take 1 capsule (240 mg total) by mouth daily.  30 capsule  6  . metoprolol tartrate (LOPRESSOR) 50 MG tablet Take 1 tablet (50 mg total) by mouth 2 (two) times daily.  60 tablet  5  . Rivaroxaban (XARELTO) 20 MG TABS tablet Take 1 tablet (20 mg total) by mouth daily with supper.  30 tablet    . amiodarone (PACERONE) 200 MG tablet Take 1 tablet (200 mg total) by mouth daily.  30 tablet  5  . amoxicillin (AMOXIL) 500 MG capsule For dental procedures       No current facility-administered medications for this visit.    No Known Allergies  Patient Active Problem List   Diagnosis Date Noted  . PAF (paroxysmal atrial fibrillation) 04/29/2013  . Myxoma of heart 04/07/2013  . NSTEMI  04/06/2013  . HTN (hypertension) 04/05/2013  . Syncope 04/05/2013  . Benign hypertensive  heart disease without heart failure 06/22/2011  . Pure hypercholesterolemia 06/22/2011    History  Smoking status  . Former Smoker  . Quit date: 07/10/1977  Smokeless tobacco  . Not on file    History  Alcohol Use No    Family History  Problem Relation Age of Onset  . Cancer Father     Review of Systems: Constitutional: no fever chills diaphoresis or fatigue or change in weight.  Head and neck: no hearing loss, no epistaxis, no photophobia or visual disturbance. Respiratory: No cough, shortness of breath or wheezing. Cardiovascular: No chest pain peripheral edema, palpitations. Gastrointestinal: No abdominal distention, no abdominal pain, no change in bowel habits hematochezia or melena. Genitourinary: No dysuria, no frequency, no urgency, no nocturia. Musculoskeletal:No arthralgias, no back pain, no gait disturbance or myalgias. Neurological: No dizziness, no headaches, no numbness, no seizures, no syncope, no weakness, no tremors. Hematologic: No lymphadenopathy, no easy bruising. Psychiatric: No confusion, no hallucinations, no sleep disturbance.    Physical Exam: Filed Vitals:   06/04/13 1425  BP: 117/77  Pulse: 111   the general appearance reveals a well-developed well-nourished gentleman in no distress.  The sternal incision is healing well.The head and neck exam reveals pupils equal and reactive.  Extraocular movements are full.  There is no scleral icterus.  The mouth and pharynx  are normal.  The neck is supple.  The carotids reveal no bruits.  The jugular venous pressure is normal.  The  thyroid is not enlarged.  There is no lymphadenopathy.  The chest is clear to percussion and auscultation.  There are no rales or rhonchi.  Expansion of the chest is symmetrical.  The precordium is quiet.  The first heart sound is normal.  The second heart sound is physiologically split.  There is no murmur gallop rub or click.  There is no abnormal lift or heave.  The abdomen is soft  and nontender.  The bowel sounds are normal.  The liver and spleen are not enlarged.  There are no abdominal masses.  There are no abdominal bruits.  Extremities reveal good pedal pulses.  There is no phlebitis or edema.  There is no cyanosis or clubbing.  Strength is normal and symmetrical in all extremities.  There is no lateralizing weakness.  There are no sensory deficits.  The skin is warm and dry.  There is no rash.  EKG today shows atrial fibrillation with rapid ventricular response of 111.  Nonspecific ST-T wave changes are present.   Assessment / Plan: We will add amiodarone 200 mg one daily in order to slow his heart rate further and also to help [place] maintain sinus rhythm after cardioversion. Cardioversion has been scheduled for 11 AM on Wednesday, December 17. The patient will return prior to that time for preoperative lab work and we will get a TSH at that time as well.

## 2013-06-04 NOTE — Assessment & Plan Note (Signed)
Blood pressure has been remaining stable on current therapy. 

## 2013-06-04 NOTE — Patient Instructions (Addendum)
START AMIODARONE 200 MG DAILY, RX SENT TO PHARMACY  RETURN ON December 12 FOR LABS  FOLLOW UP WITH  Dr. [name] 07/02/13  You are scheduled for your cardioversion on 06/25/13 at 1:00 pm. You need to arrive at [facility] and go to Short Stay at 11:15 am.  NOTHING TO EAT OR DRINK AFTER MIDNIGHT YOU WILL NEED SOMEONE TO COME WITH YOU AND DRIVE YOU HOME YOU NEED TO TAKE YOUR AMIODARONE AND METOPROLOL ONLY THE MORNING OF  WITH A SMALL SIP OF WATER  Electrical Cardioversion Cardioversion is the delivery of a jolt of electricity to change the rhythm of the heart. Sticky patches or metal paddles are placed on the chest to deliver the electricity from a device. This is done to restore a normal rhythm. A rhythm that is too fast or not regular keeps the heart from pumping well. Compared to medicines used to change an abnormal rhythm (arrhythmia), cardioversion is faster and works better.  WHEN WOULD THIS BE DONE?  In an emergency:  There is low or no blood pressure as a result of the heart rhythm.  Normal rhythm must be restored as fast as possible to protect the brain and heart from further damage.  It may save a life.  For less serious heart rhythms, such as atrial fibrillation or flutter, in which:  The heart is beating too fast or is not regular.  The heart is still able to pump enough blood, but not as well as it should.  Medicine to change the rhythm has not worked.  It is safe to wait in order to allow time for preparation. LET YOUR CAREGIVER KNOW ABOUT:   Medicines taken, including herbs, eyedrops, over-the-counter medicines, and creams.  History of abnormal heartbeats   RISKS AND COMPLICATIONS  Clots may form in your heart if you have an arrhythmia. In rare cases, cardioversion can cause a clot to break free and travel to other parts of the body. This could cause a stroke or other problems.  BEFORE THE PROCEDURE   You may have tests to detect blood  clots in your heart and evaluate heart function.  You may start taking blood thinners (anticoagulants) so your blood does not clot as easily.  Other medicines may be given to help your heart work better. PROCEDURE (SCHEDULED)  You will be given medicine through an intravenous (IV) access to reduce discomfort and make you sleepy (sedative).  Your whole body may move when the shock is delivered. Your chest may feel sore.  You may be able to go home after a few hours. Your heart rhythm will be watched to make sure it does not change. HOME CARE INSTRUCTIONS   Only take medicine as directed by your caregiver. Be sure you understand how and when to take your medicine.  Learn how to feel your pulse and check it often.  Limit your activity for 48 hours.  Avoid caffeine and other stimulants as directed. SEEK MEDICAL CARE IF:   You feel like your heart is beating too fast or your pulse is not regular.  You have any questions about your medicines.  You have bleeding that will not stop. SEEK IMMEDIATE MEDICAL CARE IF:   You are dizzy or feel faint.  It is hard to breathe or you feel short of breath.  There is a change in discomfort in your chest.  Your speech is slurred or you have trouble moving your arm or leg on one side.  You  get a muscle cramp.  Your fingers or toes turn cold or blue. MAKE SURE YOU:   Understand these instructions.  Will watch your condition.  Will get help right away if you are not doing well or get worse. Document Released: 06/16/2002 Document Revised: 10/21/2012 Document Reviewed: 01/08/2013 [facility] Patient Information 2014 [place], [place].

## 2013-06-20 ENCOUNTER — Encounter (HOSPITAL_COMMUNITY): Payer: Self-pay | Admitting: Pharmacy Technician

## 2013-06-20 ENCOUNTER — Other Ambulatory Visit (INDEPENDENT_AMBULATORY_CARE_PROVIDER_SITE_OTHER): Payer: Medicare Other

## 2013-06-20 DIAGNOSIS — I4891 Unspecified atrial fibrillation: Secondary | ICD-10-CM

## 2013-06-20 LAB — CBC WITH DIFFERENTIAL/PLATELET
Basophils Absolute: 0 10*3/uL (ref 0.0–0.1)
Eosinophils Relative: 2 % (ref 0.0–5.0)
HCT: 36.2 % — ABNORMAL LOW (ref 39.0–52.0)
Lymphocytes Relative: 20.2 % (ref 12.0–46.0)
Lymphs Abs: 1.1 10*3/uL (ref 0.7–4.0)
MCV: 84.4 fl (ref 78.0–100.0)
Monocytes Relative: 9.2 % (ref 3.0–12.0)
Neutrophils Relative %: 68.3 % (ref 43.0–77.0)
Platelets: 172 10*3/uL (ref 150.0–400.0)
RBC: 4.29 Mil/uL (ref 4.22–5.81)
WBC: 5.5 10*3/uL (ref 4.5–10.5)

## 2013-06-20 LAB — BASIC METABOLIC PANEL
BUN: 13 mg/dL (ref 6–23)
Calcium: 8.8 mg/dL (ref 8.4–10.5)
Chloride: 104 mEq/L (ref 96–112)
GFR: 86.69 mL/min (ref >60.00)
Potassium: 4.4 mEq/L (ref 3.5–5.1)
Sodium: 138 mEq/L (ref 135–145)

## 2013-06-20 LAB — APTT: aPTT: 41 s — ABNORMAL HIGH (ref 21.7–28.8)

## 2013-06-20 LAB — PROTIME-INR
INR: 1.8 ratio — ABNORMAL HIGH (ref 0.8–1.0)
Prothrombin Time: 19.1 s — ABNORMAL HIGH (ref 10.2–12.4)

## 2013-06-20 LAB — TSH: TSH: 4.42 u[IU]/mL (ref 0.35–5.50)

## 2013-06-24 ENCOUNTER — Other Ambulatory Visit: Payer: Self-pay | Admitting: Dermatology

## 2013-06-24 ENCOUNTER — Telehealth: Payer: Self-pay | Admitting: Cardiology

## 2013-06-24 DIAGNOSIS — D485 Neoplasm of uncertain behavior of skin: Secondary | ICD-10-CM | POA: Diagnosis not present

## 2013-06-24 DIAGNOSIS — D1801 Hemangioma of skin and subcutaneous tissue: Secondary | ICD-10-CM | POA: Diagnosis not present

## 2013-06-24 DIAGNOSIS — D235 Other benign neoplasm of skin of trunk: Secondary | ICD-10-CM | POA: Diagnosis not present

## 2013-06-24 DIAGNOSIS — L821 Other seborrheic keratosis: Secondary | ICD-10-CM | POA: Diagnosis not present

## 2013-06-24 DIAGNOSIS — L819 Disorder of pigmentation, unspecified: Secondary | ICD-10-CM | POA: Diagnosis not present

## 2013-06-24 NOTE — Telephone Encounter (Signed)
Advised patient of lab results   Patient stated he had some dizziness today and just didn't feel quite right earlier. When asked if he had any chest pain stated he had a little discomfort/tightness earlier. States he is feeling better. Discussed with  Dr. [name] and will proceed with cardioversion as planned, advised patient

## 2013-06-24 NOTE — Telephone Encounter (Signed)
New Message'  Pt calling for results.. Please assist.

## 2013-06-24 NOTE — Telephone Encounter (Signed)
Message copied by Burnell Blanks on Tue Jun 24, 2013 12:15 PM ------      Message from: Cassell Clement      Created: Sat Jun 21, 2013  8:16 AM       Please report.   Labs are satisfactory for DCCV ------

## 2013-06-24 NOTE — Telephone Encounter (Signed)
Left message to call back  

## 2013-06-25 ENCOUNTER — Ambulatory Visit (HOSPITAL_COMMUNITY)
Admission: RE | Admit: 2013-06-25 | Discharge: 2013-06-25 | Disposition: A | Discharge status: Home / Self Care | Payer: Medicare Other | Source: Ambulatory Visit | Attending: Cardiology | Admitting: Cardiology

## 2013-06-25 ENCOUNTER — Encounter (HOSPITAL_COMMUNITY): Payer: Self-pay | Admitting: Anesthesiology

## 2013-06-25 ENCOUNTER — Ambulatory Visit (HOSPITAL_COMMUNITY): Payer: Medicare Other | Admitting: Anesthesiology

## 2013-06-25 ENCOUNTER — Encounter (HOSPITAL_COMMUNITY)
Admission: RE | Disposition: A | Discharge status: Home / Self Care | Payer: Self-pay | Source: Ambulatory Visit | Attending: Cardiology

## 2013-06-25 ENCOUNTER — Encounter (HOSPITAL_COMMUNITY): Payer: Medicare Other | Admitting: Anesthesiology

## 2013-06-25 DIAGNOSIS — I4891 Unspecified atrial fibrillation: Secondary | ICD-10-CM | POA: Insufficient documentation

## 2013-06-25 DIAGNOSIS — I10 Essential (primary) hypertension: Secondary | ICD-10-CM | POA: Diagnosis not present

## 2013-06-25 DIAGNOSIS — Z7901 Long term (current) use of anticoagulants: Secondary | ICD-10-CM | POA: Insufficient documentation

## 2013-06-25 DIAGNOSIS — Z8673 Personal history of transient ischemic attack (TIA), and cerebral infarction without residual deficits: Secondary | ICD-10-CM | POA: Diagnosis not present

## 2013-06-25 DIAGNOSIS — E785 Hyperlipidemia, unspecified: Secondary | ICD-10-CM | POA: Insufficient documentation

## 2013-06-25 DIAGNOSIS — Z87891 Personal history of nicotine dependence: Secondary | ICD-10-CM | POA: Insufficient documentation

## 2013-06-25 HISTORY — PX: CARDIOVERSION: SHX1299

## 2013-06-25 SURGERY — CARDIOVERSION
Anesthesia: General

## 2013-06-25 MED ORDER — SODIUM CHLORIDE 0.9 % IV SOLN: INTRAVENOUS | Status: DC

## 2013-06-25 NOTE — CV Procedure (Signed)
Electrical Cardioversion Procedure Note Matthew Sullivan [id number] 1945-11-08  Procedure: Electrical Cardioversion Indications:  Atrial Fibrillation  Procedure Details Consent: Risks of procedure as well as the alternatives and risks of each were explained to the (patient/caregiver).  Consent for procedure obtained. Time Out: Verified patient identification, verified procedure, site/side was marked, verified correct patient position, special equipment/implants available, medications/allergies/relevent history reviewed, required imaging and test results available.  Performed  Patient placed on cardiac monitor, pulse oximetry, supplemental oxygen as necessary.  Sedation given: propofol 70 mg Pacer pads placed anterior and posterior chest.  Cardioverted 1 time(s).  Cardioverted at 120J.  Evaluation Findings: Post procedure EKG shows: NSR Complications: None Patient did tolerate procedure well.   Cassell Clement 06/25/2013, 1:18 PM

## 2013-06-25 NOTE — Anesthesia Procedure Notes (Signed)
Procedure Name: MAC Date/Time: 06/25/2013 1:14 PM Performed by: Carmela Rima Pre-anesthesia Checklist: Patient identified, Emergency Drugs available, Suction available, Patient being monitored and Timeout performed Patient Re-evaluated:Patient Re-evaluated prior to inductionOxygen Delivery Method: Circle system utilized Preoxygenation: Pre-oxygenation with 100% oxygen Intubation Type: IV induction Ventilation: Mask ventilation without difficulty

## 2013-06-25 NOTE — Anesthesia Postprocedure Evaluation (Signed)
  Anesthesia Post-op Note  Patient: Matthew Sullivan  Procedure(s) Performed: Procedure(s): CARDIOVERSION (N/A)  Patient Location: PACU and Endoscopy Unit  Anesthesia Type:MAC  Level of Consciousness: awake, alert  and oriented  Airway and Oxygen Therapy: Patient Spontanous Breathing and Patient connected to nasal cannula oxygen  Post-op Pain: none  Post-op Assessment: Post-op Vital signs reviewed, Patient's Cardiovascular Status Stable, Respiratory Function Stable, Patent Airway, No signs of Nausea or vomiting, Adequate PO intake and Pain level controlled  Post-op Vital Signs: Reviewed and stable  Complications: No apparent anesthesia complications

## 2013-06-25 NOTE — Transfer of Care (Signed)
Immediate Anesthesia Transfer of Care Note  Patient: Matthew Sullivan  Procedure(s) Performed: Procedure(s): CARDIOVERSION (N/A)  Patient Location: PACU and Endoscopy Unit  Anesthesia Type:MAC  Level of Consciousness: awake, alert  and oriented  Airway & Oxygen Therapy: Patient Spontanous Breathing and Patient connected to nasal cannula oxygen  Post-op Assessment: Report given to PACU RN, Post -op Vital signs reviewed and stable and Patient moving all extremities X 4  Post vital signs: Reviewed and stable  Complications: No apparent anesthesia complications

## 2013-06-25 NOTE — Preoperative (Signed)
Beta Blockers   Reason not to administer Beta Blockers:Not Applicable 

## 2013-06-25 NOTE — Anesthesia Preprocedure Evaluation (Addendum)
Anesthesia Evaluation  Patient identified by MRN, date of birth, ID band Patient awake    Reviewed: Allergy & Precautions, H&P , NPO status , Patient's Chart, lab work & pertinent test results  Airway Mallampati: II TM Distance: >3 FB Neck ROM: Full    Dental no notable dental hx. (+) Teeth Intact and Dental Advidsory Given   Pulmonary neg pulmonary ROS, former smoker,  breath sounds clear to auscultation  Pulmonary exam normal       Cardiovascular hypertension, On Medications + Past MI + dysrhythmias Atrial Fibrillation Rhythm:irregular Rate:Normal     Neuro/Psych CVA negative neurological ROS  negative psych ROS   GI/Hepatic negative GI ROS, Neg liver ROS,   Endo/Other  negative endocrine ROS  Renal/GU negative Renal ROS  negative genitourinary   Musculoskeletal   Abdominal   Peds  Hematology negative hematology ROS (+)   Anesthesia Other Findings   Reproductive/Obstetrics negative OB ROS                          Anesthesia Physical Anesthesia Plan  ASA: III  Anesthesia Plan: General   Post-op Pain Management:    Induction: Intravenous  Airway Management Planned: Mask  Additional Equipment:   Intra-op Plan:   Post-operative Plan:   Informed Consent: I have reviewed the patients History and Physical, chart, labs and discussed the procedure including the risks, benefits and alternatives for the proposed anesthesia with the patient or authorized representative who has indicated his/her understanding and acceptance.   Dental Advisory Given  Plan Discussed with: Anesthesiologist, CRNA and Surgeon  Anesthesia Plan Comments:        Anesthesia Quick Evaluation

## 2013-06-25 NOTE — H&P (Signed)
Matthew Sullivan is an 67 y.o. male.   Chief Complaint: Atrial fibrillation. HPI: He has a history of left atrial myxoma and underwent successful surgical removal by Dr. Dorris Fetch on 04/09/13. He was in normal sinus rhythm at discharge. After returning home the patient went into atrial fibrillation with rapid ventricular response. He has been on Xarelto since 04/18/13. He has not been experiencing any TIA symptoms. Is not having any bleeding problems. His heart rate has improved with the addition of diltiazem and metoprolol. He has been walking a mile to a mile and a half a day for exercise. He is not doing any heavy physical work yet. 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 Hypertension   . Hyperlipidemia   . History of tinnitus   . Cervical osteoarthritis   . MI, old 61  . History of embolic stroke   . Melanoma   . Atrial myxoma   . Stroke     "TIA 10 years ago"    Past Surgical History  Procedure Laterality Date  . Back surgery    . Appendectomy    . Nose surgery    . Nasal septum surgery    . Excision of atrial myxoma Left 04/09/2013    Procedure: EXCISION OF ATRIAL MYXOMA;  Surgeon: Loreli Slot, MD;  Location: [facility];  Service: Open Heart Surgery;  Laterality: Left;  . Intraoperative transesophageal echocardiogram N/A 04/09/2013    Procedure: INTRAOPERATIVE TRANSESOPHAGEAL ECHOCARDIOGRAM;  Surgeon: Loreli Slot, MD;  Location: [facility] OR;  Service: Open Heart Surgery;  Laterality: N/A;  . Rotator cuff repair      both shoulders    Family History  Problem Relation Age of Onset  . Cancer Father    Social History:  reports that he quit smoking about 35 years ago. He does not have any smokeless tobacco history on file. He reports that he does not drink alcohol or use illicit drugs.  Allergies: No Known Allergies  Medications Prior to Admission  Medication Sig  Dispense Refill  . amiodarone (PACERONE) 200 MG tablet Take 1 tablet (200 mg total) by mouth daily.  30 tablet  5  . atorvastatin (LIPITOR) 80 MG tablet Take 0.5 tablets (40 mg total) by mouth every evening.  90 tablet  3  . diltiazem (CARDIZEM CD) 240 MG 24 hr capsule Take 1 capsule (240 mg total) by mouth daily.  30 capsule  6  . metoprolol tartrate (LOPRESSOR) 50 MG tablet Take 1 tablet (50 mg total) by mouth 2 (two) times daily.  60 tablet  5  . Rivaroxaban (XARELTO) 20 MG TABS tablet Take 1 tablet (20 mg total) by mouth daily with supper.  30 tablet    . amoxicillin (AMOXIL) 500 MG capsule Take 500 mg by mouth daily as needed (for dental procedure). For dental procedures        No results found for this or any previous visit (from the past 48 hour(s)). No results found.  ROS Negative except for heart rhythm  Blood pressure 117/82, pulse 65, temperature 97.8 F (36.6 C), temperature source Oral, resp. rate 17, SpO2 100.00%. Physical Exam  the general appearance reveals a well-developed well-nourished gentleman in no distress. The sternal incision is healing well.The head and neck exam reveals pupils equal and reactive. Extraocular movements are full. There is no scleral icterus. The mouth and pharynx are  normal. The neck is supple. The carotids reveal no bruits. The jugular venous pressure is normal. The thyroid is not enlarged. There is no lymphadenopathy. The chest is clear to percussion and auscultation. There are no rales or rhonchi. Expansion of the chest is symmetrical. The precordium is quiet. The first heart sound is normal. The second heart sound is physiologically split. There is no murmur gallop rub or click. There is no abnormal lift or heave. The abdomen is soft and nontender. The bowel sounds are normal. The liver and spleen are not enlarged. There are no abdominal masses. There are no abdominal bruits. Extremities reveal good pedal pulses. There is no phlebitis or edema. There is  no cyanosis or clubbing. Strength is normal and symmetrical in all extremities. There is no lateralizing weakness. There are no sensory deficits. The skin is warm and dry. There is no rash  Assessment/Plan Atrial fibrillation Post-op left atrial myxoma resection.  Plan: DCCV today  Cassell Clement 06/25/2013, 12:27 PM

## 2013-06-26 ENCOUNTER — Encounter (HOSPITAL_COMMUNITY): Payer: Self-pay | Admitting: Cardiology

## 2013-07-02 ENCOUNTER — Encounter: Payer: Self-pay | Admitting: Cardiology

## 2013-07-02 ENCOUNTER — Ambulatory Visit (INDEPENDENT_AMBULATORY_CARE_PROVIDER_SITE_OTHER): Payer: Medicare Other | Admitting: Cardiology

## 2013-07-02 VITALS — BP 110/72 | HR 58 | Ht 72.0 in | Wt 207.0 lb

## 2013-07-02 DIAGNOSIS — I480 Paroxysmal atrial fibrillation: Secondary | ICD-10-CM

## 2013-07-02 DIAGNOSIS — I119 Hypertensive heart disease without heart failure: Secondary | ICD-10-CM

## 2013-07-02 DIAGNOSIS — I10 Essential (primary) hypertension: Secondary | ICD-10-CM | POA: Diagnosis not present

## 2013-07-02 DIAGNOSIS — I4891 Unspecified atrial fibrillation: Secondary | ICD-10-CM

## 2013-07-02 NOTE — Assessment & Plan Note (Signed)
Blood pressure is remaining low normal on current medication.  EKG today shows sinus rhythm at 60 per minute.  He states that he is having side effects from the diltiazem.  He states that the smell of the diltiazem is coming through his skin.  We will stop the diltiazem at this point since he is back in sinus rhythm.

## 2013-07-02 NOTE — Assessment & Plan Note (Signed)
The patient has felt much better since cardioversion.  He states that he felt better immediately.  He is not having any shortness of breath or chest discomfort.  He has had no TIA symptoms.  He remains on Xarelto.

## 2013-07-02 NOTE — Progress Notes (Signed)
Kem Elige Radon Date of Birth:  06-15-1946 [address] [place], [place]  [address] [phone]         Fax   [phone]  History of Present Illness: This pleasant 67 year old gentleman is seen for a post cardioversion office visit.  The patient had successful cardioversion on 06/25/13.  He has a history of left atrial myxoma and underwent successful surgical removal by Dr. Dorris Fetch on 04/09/13.  He was in normal sinus rhythm at discharge.  After returning home the patient went into atrial fibrillation with rapid ventricular response.  He has been on Xarelto since 04/18/13.  He has not been experiencing any TIA symptoms.  Is not having any bleeding problems.    He has been walking a mile to a mile and a half a day for exercise.  He is not doing any heavy physical work yet.    Current Outpatient Prescriptions  Medication Sig Dispense Refill  . amiodarone (PACERONE) 200 MG tablet Take 1 tablet (200 mg total) by mouth daily.  30 tablet  5  . amoxicillin (AMOXIL) 500 MG capsule Take 500 mg by mouth daily as needed (for dental procedure). For dental procedures      . atorvastatin (LIPITOR) 80 MG tablet Take 0.5 tablets (40 mg total) by mouth every evening.  90 tablet  3  . metoprolol tartrate (LOPRESSOR) 50 MG tablet Take 1 tablet (50 mg total) by mouth 2 (two) times daily.  60 tablet  5  . Rivaroxaban (XARELTO) 20 MG TABS tablet Take 1 tablet (20 mg total) by mouth daily with supper.  30 tablet     No current facility-administered medications for this visit.    No Known Allergies  Patient Active Problem List   Diagnosis Date Noted  . PAF (paroxysmal atrial fibrillation) 04/29/2013  . Myxoma of heart 04/07/2013  . NSTEMI  04/06/2013  . HTN (hypertension) 04/05/2013  . Syncope 04/05/2013  . Benign hypertensive heart disease without heart failure 06/22/2011  . Pure hypercholesterolemia 06/22/2011    History  Smoking status  . Former Smoker  . Quit date:  07/10/1977  Smokeless tobacco  . Not on file    History  Alcohol Use No    Family History  Problem Relation Age of Onset  . Cancer Father     Review of Systems: Constitutional: no fever chills diaphoresis or fatigue or change in weight.  Head and neck: no hearing loss, no epistaxis, no photophobia or visual disturbance. Respiratory: No cough, shortness of breath or wheezing. Cardiovascular: No chest pain peripheral edema, palpitations. Gastrointestinal: No abdominal distention, no abdominal pain, no change in bowel habits hematochezia or melena. Genitourinary: No dysuria, no frequency, no urgency, no nocturia. Musculoskeletal:No arthralgias, no back pain, no gait disturbance or myalgias. Neurological: No dizziness, no headaches, no numbness, no seizures, no syncope, no weakness, no tremors. Hematologic: No lymphadenopathy, no easy bruising. Psychiatric: No confusion, no hallucinations, no sleep disturbance.    Physical Exam: Filed Vitals:   07/02/13 1043  BP: 110/72  Pulse: 58   the general appearance reveals a well-developed well-nourished gentleman in no distress.  The sternal incision is healing well.The head and neck exam reveals pupils equal and reactive.  Extraocular movements are full.  There is no scleral icterus.  The mouth and pharynx are normal.  The neck is supple.  The carotids reveal no bruits.  The jugular venous pressure is normal.  The  thyroid is not enlarged.  There is  no lymphadenopathy.  The chest is clear to percussion and auscultation.  There are no rales or rhonchi.  Expansion of the chest is symmetrical.  The precordium is quiet.  The first heart sound is normal.  The second heart sound is physiologically split.  There is no murmur gallop rub or click.  There is no abnormal lift or heave.  The abdomen is soft and nontender.  The bowel sounds are normal.  The liver and spleen are not enlarged.  There are no abdominal masses.  There are no abdominal bruits.   Extremities reveal good pedal pulses.  There is no phlebitis or edema.  There is no cyanosis or clubbing.  Strength is normal and symmetrical in all extremities.  There is no lateralizing weakness.  There are no sensory deficits.  The skin is warm and dry.  There is no rash.  EKG today shows normal sinus rhythm with occasional PACs.  There is nonspecific ST-T wave abnormality.  Assessment / Plan: Stop diltiazem.  Continue other medicine the same.  Recheck in 2 months for followup office visit and EKG.

## 2013-07-02 NOTE — Patient Instructions (Signed)
STOP DILTIAZEM   Your physician recommends that you schedule a follow-up appointment in: 2 month ov/ekg

## 2013-07-29 ENCOUNTER — Other Ambulatory Visit: Payer: Self-pay | Admitting: Dermatology

## 2013-07-29 DIAGNOSIS — L905 Scar conditions and fibrosis of skin: Secondary | ICD-10-CM | POA: Diagnosis not present

## 2013-07-29 DIAGNOSIS — D485 Neoplasm of uncertain behavior of skin: Secondary | ICD-10-CM | POA: Diagnosis not present

## 2013-08-13 ENCOUNTER — Telehealth: Payer: Self-pay | Admitting: Cardiology

## 2013-08-13 NOTE — Telephone Encounter (Signed)
New message    Patient wife calling    Samples of xarelto  20 mg

## 2013-08-14 ENCOUNTER — Telehealth: Payer: Self-pay

## 2013-08-14 NOTE — Telephone Encounter (Signed)
Patient called for samples of xarelto ,placed them up front

## 2013-09-02 ENCOUNTER — Encounter: Payer: Self-pay | Admitting: Cardiology

## 2013-09-02 ENCOUNTER — Ambulatory Visit (INDEPENDENT_AMBULATORY_CARE_PROVIDER_SITE_OTHER): Payer: Medicare Other | Admitting: Cardiology

## 2013-09-02 VITALS — BP 144/87 | HR 59 | Ht 72.0 in | Wt 208.0 lb

## 2013-09-02 DIAGNOSIS — D151 Benign neoplasm of heart: Secondary | ICD-10-CM

## 2013-09-02 DIAGNOSIS — I214 Non-ST elevation (NSTEMI) myocardial infarction: Secondary | ICD-10-CM | POA: Diagnosis not present

## 2013-09-02 DIAGNOSIS — I119 Hypertensive heart disease without heart failure: Secondary | ICD-10-CM | POA: Diagnosis not present

## 2013-09-02 DIAGNOSIS — I4891 Unspecified atrial fibrillation: Secondary | ICD-10-CM

## 2013-09-02 DIAGNOSIS — I480 Paroxysmal atrial fibrillation: Secondary | ICD-10-CM

## 2013-09-02 NOTE — Patient Instructions (Signed)
Your physician recommends that you continue on your current medications as directed. Please refer to the Current Medication list given to you today.  Your physician recommends that you schedule a follow-up appointment in: 2 month ov  Decrease your salt intake

## 2013-09-02 NOTE — Assessment & Plan Note (Signed)
Blood pressure was elevated today on arrival.  I rechecked his blood pressure and it was still high at 160/84.  Normally he has normal blood pressure.  However he has been eating a lot of salt and last night ate an entire bag of salty popcorn.  He will avoid dietary salt and we will continue to monitor his blood pressure.  He has a blood pressure cuff at home.

## 2013-09-02 NOTE — Assessment & Plan Note (Signed)
The patient has not had any recurrent chest pain or angina pectoris. 

## 2013-09-02 NOTE — Progress Notes (Signed)
[place] Date of Birth:  12-25-1945 [address] [place], [place]  [address] [phone]         Fax   [phone]  History of Present Illness: This pleasant 68 year old gentleman is seen for a scheduled followup office visit.  The patient had successful cardioversion on 06/25/13.  He has a history of left atrial myxoma and underwent successful surgical removal by Dr. Roxan Hockey on 04/09/13.  He was in normal sinus rhythm at discharge.  After returning home the patient went into atrial fibrillation with rapid ventricular response.  He has been on Xarelto since 04/18/13.  He has not been experiencing any TIA symptoms.  Is not having any bleeding problems.    He has been walking a mile to a mile and a half a day for exercise.  He has gone back to doing Architect work.   Current Outpatient Prescriptions  Medication Sig Dispense Refill  . amiodarone (PACERONE) 200 MG tablet Take 1 tablet (200 mg total) by mouth daily.  30 tablet  5  . amoxicillin (AMOXIL) 500 MG capsule Take 500 mg by mouth daily as needed (for dental procedure). For dental procedures      . atorvastatin (LIPITOR) 80 MG tablet Take 0.5 tablets (40 mg total) by mouth every evening.  90 tablet  3  . metoprolol tartrate (LOPRESSOR) 50 MG tablet Take 1 tablet (50 mg total) by mouth 2 (two) times daily.  60 tablet  5  . Rivaroxaban (XARELTO) 20 MG TABS tablet Take 1 tablet (20 mg total) by mouth daily with supper.  30 tablet     No current facility-administered medications for this visit.    No Known Allergies  Patient Active Problem List   Diagnosis Date Noted  . PAF (paroxysmal atrial fibrillation) 04/29/2013  . Myxoma of heart 04/07/2013  . NSTEMI  04/06/2013  . HTN (hypertension) 04/05/2013  . Syncope 04/05/2013  . Benign hypertensive heart disease without heart failure 06/22/2011  . Pure hypercholesterolemia 06/22/2011    History  Smoking status  . Former Smoker  . Quit date:  07/10/1977  Smokeless tobacco  . Not on file    History  Alcohol Use No    Family History  Problem Relation Age of Onset  . Cancer Father     Review of Systems: Constitutional: no fever chills diaphoresis or fatigue or change in weight.  Head and neck: no hearing loss, no epistaxis, no photophobia or visual disturbance. Respiratory: No cough, shortness of breath or wheezing. Cardiovascular: No chest pain peripheral edema, palpitations. Gastrointestinal: No abdominal distention, no abdominal pain, no change in bowel habits hematochezia or melena. Genitourinary: No dysuria, no frequency, no urgency, no nocturia. Musculoskeletal:No arthralgias, no back pain, no gait disturbance or myalgias. Neurological: No dizziness, no headaches, no numbness, no seizures, no syncope, no weakness, no tremors. Hematologic: No lymphadenopathy, no easy bruising. Psychiatric: No confusion, no hallucinations, no sleep disturbance.    Physical Exam: Filed Vitals:   09/02/13 1219  BP: 144/87  Pulse: 59   the general appearance reveals a well-developed well-nourished gentleman in no distress.  The sternal incision is healing well.The head and neck exam reveals pupils equal and reactive.  Extraocular movements are full.  There is no scleral icterus.  The mouth and pharynx are normal.  The neck is supple.  The carotids reveal no bruits.  The jugular venous pressure is normal.  The  thyroid is not enlarged.  There is no  lymphadenopathy.  The chest is clear to percussion and auscultation.  There are no rales or rhonchi.  Expansion of the chest is symmetrical.  The precordium is quiet.  The first heart sound is normal.  The second heart sound is physiologically split.  There is no murmur gallop rub or click.  There is no abnormal lift or heave.  The abdomen is soft and nontender.  The bowel sounds are normal.  The liver and spleen are not enlarged.  There are no abdominal masses.  There are no abdominal bruits.   Extremities reveal good pedal pulses.  There is no phlebitis or edema.  There is no cyanosis or clubbing.  Strength is normal and symmetrical in all extremities.  There is no lateralizing weakness.  There are no sensory deficits.  The skin is warm and dry.  There is no rash.  EKG today shows normal sinus rhythm with occasional PACs.  There is nonspecific ST-T wave abnormality which has improved since the previous tracing of 07/02/13.  Assessment / Plan: Continue same medication.  Cut way back on dietary salt because of elevated blood pressure.  Continue to monitor blood pressure at home.  No new medications prescribed today.  Recheck in 2 months for office visit.

## 2013-09-02 NOTE — Assessment & Plan Note (Signed)
The [place] this [place].  On February 11 and again on February 12 he did feel some palpitations which lasted several hours in which he thinks may have been intermittent paroxysmal atrial fibrillation.  The patient is on Xarelto.  EKG today shows no arrhythmia.

## 2013-09-15 ENCOUNTER — Telehealth: Payer: Self-pay | Admitting: *Deleted

## 2013-09-15 NOTE — Telephone Encounter (Signed)
Patients wife requests xarelto samples for patient. Samples provided.

## 2013-09-17 ENCOUNTER — Other Ambulatory Visit: Payer: Self-pay | Admitting: Neurosurgery

## 2013-09-17 DIAGNOSIS — M47816 Spondylosis without myelopathy or radiculopathy, lumbar region: Secondary | ICD-10-CM

## 2013-09-18 ENCOUNTER — Telehealth: Payer: Self-pay | Admitting: Cardiology

## 2013-09-18 NOTE — Telephone Encounter (Signed)
Left message to call back with kind of procedure patient is having

## 2013-09-18 NOTE — Telephone Encounter (Signed)
Lumbar steroid injection, one day per radiologist recommendations. Will forward to  Dr. Mare Ferrari for review

## 2013-09-18 NOTE — Telephone Encounter (Signed)
He is back in NSR. Okay to leave off xarelto for 1 day.

## 2013-09-18 NOTE — Telephone Encounter (Signed)
New Prob   Requesting to stop Xarelto 1 day prior to procedure (has not been scheduled yet). Please call.

## 2013-09-19 NOTE — Telephone Encounter (Signed)
Advised Matthew Sullivan

## 2013-09-19 NOTE — Telephone Encounter (Signed)
Discussed further with  Dr. Mare Ferrari and will have patient leave off Xarelto x 2 days  Left Danielle message to call back

## 2013-09-25 ENCOUNTER — Ambulatory Visit
Admission: RE | Admit: 2013-09-25 | Discharge: 2013-09-25 | Disposition: A | Discharge status: Home / Self Care | Payer: Medicare Other | Source: Ambulatory Visit | Attending: Neurosurgery | Admitting: Neurosurgery

## 2013-09-25 ENCOUNTER — Other Ambulatory Visit: Payer: Self-pay | Admitting: Neurosurgery

## 2013-09-25 VITALS — BP 168/89 | HR 68

## 2013-09-25 DIAGNOSIS — M47816 Spondylosis without myelopathy or radiculopathy, lumbar region: Secondary | ICD-10-CM

## 2013-09-25 DIAGNOSIS — M5126 Other intervertebral disc displacement, lumbar region: Secondary | ICD-10-CM | POA: Diagnosis not present

## 2013-09-25 MED ORDER — IOHEXOL 180 MG/ML  SOLN
1.0000 mL | Freq: Once | INTRAMUSCULAR | Status: AC | PRN
  Administered 2013-09-25: 1 mL via EPIDURAL

## 2013-09-25 MED ORDER — METHYLPREDNISOLONE ACETATE 40 MG/ML INJ SUSP (RADIOLOG
120.0000 mg | Freq: Once | INTRAMUSCULAR | Status: AC
  Administered 2013-09-25: 120 mg via EPIDURAL

## 2013-09-25 NOTE — Discharge Instructions (Signed)
Post Procedure Spinal Discharge Instruction Sheet  1. You may resume a regular diet and any medications that you routinely take (including pain medications).  2. No driving day of procedure.  3. Light activity throughout the rest of the day.  Do not do any strenuous work, exercise, bending or lifting.  The day following the procedure, you can resume normal physical activity but you should refrain from exercising or physical therapy for at least three days thereafter.   Common Side Effects:   Headaches- take your usual medications as directed by your physician.  Increase your fluid intake.  Caffeinated beverages may be helpful.  Lie flat in bed until your headache resolves.   Restlessness or inability to sleep- you may have trouble sleeping for the next few days.  Ask your referring physician if you need any medication for sleep.   Facial flushing or redness- should subside within a few days.   Increased pain- a temporary increase in pain a day or two following your procedure is not unusual.  Take your pain medication as prescribed by your referring physician.   Leg cramps  Please contact our office at [phone] for the following symptoms:  Fever greater than 100 degrees.  Headaches unresolved with medication after 2-3 days.  Increased swelling, pain, or redness at injection site.  Thank you for visiting our office.  Resume xarelto today.

## 2013-10-07 ENCOUNTER — Telehealth: Payer: Self-pay | Admitting: *Deleted

## 2013-10-07 NOTE — Telephone Encounter (Signed)
Per [employer] [facility] approved patient  CELEBREX 200 MG CAPSULES Will be shipped to our office VALID until 07/09/2014 ID # [id number] [phone] automated re-ordering system Allow 7-10 days

## 2013-10-07 NOTE — Telephone Encounter (Signed)
Patient is just taking Celebrex as needed as instructed by  Dr. Mare Ferrari per patient. Will forward to  Dr. Mare Ferrari to make sure ok to continue with Xarelto. Patient states he does not take them daily

## 2013-10-07 NOTE — Telephone Encounter (Signed)
Yes okay to take occasional celebrex.  Continue xarelto

## 2013-10-08 DIAGNOSIS — M47812 Spondylosis without myelopathy or radiculopathy, cervical region: Secondary | ICD-10-CM | POA: Diagnosis not present

## 2013-10-08 DIAGNOSIS — M47817 Spondylosis without myelopathy or radiculopathy, lumbosacral region: Secondary | ICD-10-CM | POA: Diagnosis not present

## 2013-10-08 NOTE — Telephone Encounter (Signed)
Patient aware.

## 2013-10-31 ENCOUNTER — Ambulatory Visit (INDEPENDENT_AMBULATORY_CARE_PROVIDER_SITE_OTHER): Payer: Medicare Other | Admitting: Cardiology

## 2013-10-31 ENCOUNTER — Encounter: Payer: Self-pay | Admitting: Cardiology

## 2013-10-31 VITALS — BP 126/70 | HR 53 | Ht 72.0 in | Wt 216.0 lb

## 2013-10-31 DIAGNOSIS — I4891 Unspecified atrial fibrillation: Secondary | ICD-10-CM

## 2013-10-31 DIAGNOSIS — I119 Hypertensive heart disease without heart failure: Secondary | ICD-10-CM

## 2013-10-31 DIAGNOSIS — D151 Benign neoplasm of heart: Secondary | ICD-10-CM

## 2013-10-31 DIAGNOSIS — E7800 Pure hypercholesterolemia, unspecified: Secondary | ICD-10-CM

## 2013-10-31 DIAGNOSIS — Z79899 Other long term (current) drug therapy: Secondary | ICD-10-CM

## 2013-10-31 DIAGNOSIS — I480 Paroxysmal atrial fibrillation: Secondary | ICD-10-CM

## 2013-10-31 NOTE — Assessment & Plan Note (Signed)
Blood pressure was remaining stable on metoprolol.

## 2013-10-31 NOTE — Patient Instructions (Signed)
STOP XARELTO  START ASPIRIN 2 MG DAILY  Your physician wants you to follow-up in: 3 months with fasting labs (LP/BMET/HFP/TSH) AND EKG You will receive a reminder letter in the mail two months in advance. If you don't receive a letter, please call our office to schedule the follow-up appointment.

## 2013-10-31 NOTE — Assessment & Plan Note (Signed)
The patient has had no recurrence of his atrial fibrillation.

## 2013-10-31 NOTE — Assessment & Plan Note (Signed)
Patient is on high-dose Lipitor 80 mg daily for his hypercholesterolemia.  Is not having any myalgias.  We will plan to recheck his lipids and his next visit

## 2013-10-31 NOTE — Addendum Note (Signed)
Addended by: Alvina Filbert B on: 10/31/2013 03:35 PM   Modules accepted: Orders

## 2013-10-31 NOTE — Progress Notes (Signed)
[place] Date of Birth:  27-Apr-1946 [address] [place], [place]  [address] [phone]         Fax   [phone]  History of Present Illness: This pleasant 69 year old gentleman is seen for a scheduled followup office visit.  The patient had successful cardioversion on 06/25/13.  He has a history of left atrial myxoma and underwent successful surgical removal by Dr. Roxan Hockey on 04/09/13.  He was in normal sinus rhythm at discharge.  After returning home the patient went into atrial fibrillation with rapid ventricular response.  He has been on Xarelto since 04/18/13.  He has not been experiencing any TIA symptoms.  Is not having any bleeding problems.    He has been walking a mile to a mile and a half a day for exercise.  He has gone back to doing Architect work.   Current Outpatient Prescriptions  Medication Sig Dispense Refill  . amiodarone (PACERONE) 200 MG tablet Take 1 tablet (200 mg total) by mouth daily.  30 tablet  5  . amoxicillin (AMOXIL) 500 MG capsule Take 500 mg by mouth daily as needed (for dental procedure). For dental procedures      . aspirin 81 MG tablet Take 81 mg by mouth daily.      Marland Kitchen atorvastatin (LIPITOR) 80 MG tablet Take 0.5 tablets (40 mg total) by mouth every evening.  90 tablet  3  . celecoxib (CELEBREX) 200 MG capsule Take 200 mg by mouth as needed.      . metoprolol tartrate (LOPRESSOR) 50 MG tablet Take 1 tablet (50 mg total) by mouth 2 (two) times daily.  60 tablet  5   No current facility-administered medications for this visit.    No Known Allergies  Patient Active Problem List   Diagnosis Date Noted  . PAF (paroxysmal atrial fibrillation) 04/29/2013  . Myxoma of heart 04/07/2013  . NSTEMI  04/06/2013  . HTN (hypertension) 04/05/2013  . Syncope 04/05/2013  . Benign hypertensive heart disease without heart failure 06/22/2011  . Pure hypercholesterolemia 06/22/2011    History  Smoking status  . Former  Smoker  . Quit date: 07/10/1977  Smokeless tobacco  . Not on file    History  Alcohol Use No    Family History  Problem Relation Age of Onset  . Cancer Father     Review of Systems: Constitutional: no fever chills diaphoresis or fatigue or change in weight.  Head and neck: no hearing loss, no epistaxis, no photophobia or visual disturbance. Respiratory: No cough, shortness of breath or wheezing. Cardiovascular: No chest pain peripheral edema, palpitations. Gastrointestinal: No abdominal distention, no abdominal pain, no change in bowel habits hematochezia or melena. Genitourinary: No dysuria, no frequency, no urgency, no nocturia. Musculoskeletal:No arthralgias, no back pain, no gait disturbance or myalgias. Neurological: No dizziness, no headaches, no numbness, no seizures, no syncope, no weakness, no tremors. Hematologic: No lymphadenopathy, no easy bruising. Psychiatric: No confusion, no hallucinations, no sleep disturbance.    Physical Exam: Filed Vitals:   10/31/13 1040  BP: 126/70  Pulse: 53   the general appearance reveals a well-developed well-nourished gentleman in no distress.  The sternal incision is healing well.The head and neck exam reveals pupils equal and reactive.  Extraocular movements are full.  There is no scleral icterus.  The mouth and pharynx are normal.  The neck is supple.  The carotids reveal no bruits.  The jugular venous pressure is normal.  The  thyroid is not enlarged.  There is no lymphadenopathy.  The chest is clear to percussion and auscultation.  There are no rales or rhonchi.  Expansion of the chest is symmetrical.  The precordium is quiet.  The first heart sound is normal.  The second heart sound is physiologically split.  There is no murmur gallop rub or click.  There is no abnormal lift or heave.  The abdomen is soft and nontender.  The bowel sounds are normal.  The liver and spleen are not enlarged.  There are no abdominal masses.  There are no  abdominal bruits.  Extremities reveal good pedal pulses.  There is no phlebitis or edema.  There is no cyanosis or clubbing.  Strength is normal and symmetrical in all extremities.  There is no lateralizing weakness.  There are no sensory deficits.  The skin is warm and dry.  There is no rash.     Assessment / Plan: At this point we will stop his Xarelto at his request.  He will use a baby aspirin instead.  He stands it if he goes back into atrial fibrillation to restart the Xarelto.  At this point we will attribute the atrial fibrillation to postoperative healing from his atrial myxoma. Recheck in 3 months for followup office visit EKG lipid panel hepatic function panel basal metabolic panel and TSH.  He will remain on low-dose amiodarone.

## 2013-11-26 ENCOUNTER — Other Ambulatory Visit: Payer: Self-pay

## 2013-11-26 DIAGNOSIS — I4891 Unspecified atrial fibrillation: Secondary | ICD-10-CM

## 2013-11-26 MED ORDER — AMIODARONE HCL 200 MG PO TABS: 200.0000 mg | ORAL_TABLET | Freq: Every day | ORAL | Status: DC

## 2013-12-25 DIAGNOSIS — M25579 Pain in unspecified ankle and joints of unspecified foot: Secondary | ICD-10-CM | POA: Diagnosis not present

## 2013-12-25 DIAGNOSIS — M19019 Primary osteoarthritis, unspecified shoulder: Secondary | ICD-10-CM | POA: Diagnosis not present

## 2013-12-25 DIAGNOSIS — M76899 Other specified enthesopathies of unspecified lower limb, excluding foot: Secondary | ICD-10-CM | POA: Diagnosis not present

## 2014-03-05 ENCOUNTER — Other Ambulatory Visit: Payer: Self-pay

## 2014-03-05 DIAGNOSIS — I480 Paroxysmal atrial fibrillation: Secondary | ICD-10-CM

## 2014-03-05 MED ORDER — METOPROLOL TARTRATE 50 MG PO TABS: 50.0000 mg | ORAL_TABLET | Freq: Two times a day (BID) | ORAL | Status: DC

## 2014-03-10 ENCOUNTER — Other Ambulatory Visit (INDEPENDENT_AMBULATORY_CARE_PROVIDER_SITE_OTHER): Payer: Medicare Other

## 2014-03-10 ENCOUNTER — Ambulatory Visit (INDEPENDENT_AMBULATORY_CARE_PROVIDER_SITE_OTHER): Payer: Medicare Other | Admitting: Cardiology

## 2014-03-10 ENCOUNTER — Encounter: Payer: Self-pay | Admitting: Cardiology

## 2014-03-10 VITALS — BP 160/90 | HR 45 | Ht 72.0 in | Wt 220.8 lb

## 2014-03-10 DIAGNOSIS — I119 Hypertensive heart disease without heart failure: Secondary | ICD-10-CM

## 2014-03-10 DIAGNOSIS — Z79899 Other long term (current) drug therapy: Secondary | ICD-10-CM

## 2014-03-10 DIAGNOSIS — D151 Benign neoplasm of heart: Secondary | ICD-10-CM

## 2014-03-10 DIAGNOSIS — I4891 Unspecified atrial fibrillation: Secondary | ICD-10-CM | POA: Diagnosis not present

## 2014-03-10 DIAGNOSIS — E7800 Pure hypercholesterolemia, unspecified: Secondary | ICD-10-CM | POA: Diagnosis not present

## 2014-03-10 DIAGNOSIS — I480 Paroxysmal atrial fibrillation: Secondary | ICD-10-CM

## 2014-03-10 LAB — BASIC METABOLIC PANEL
BUN: 13 mg/dL (ref 6–23)
CALCIUM: 8.8 mg/dL (ref 8.4–10.5)
CO2: 32 mEq/L (ref 19–32)
CREATININE: 1.1 mg/dL (ref 0.4–1.5)
Chloride: 101 mEq/L (ref 96–112)
GFR: 73.47 mL/min (ref >60.00)
Glucose, Bld: 100 mg/dL — ABNORMAL HIGH (ref 70–99)
Potassium: 4 mEq/L (ref 3.5–5.1)
SODIUM: 139 meq/L (ref 135–145)

## 2014-03-10 LAB — HEPATIC FUNCTION PANEL
ALBUMIN: 3.9 g/dL (ref 3.5–5.2)
ALK PHOS: 51 U/L (ref 39–117)
ALT: 32 U/L (ref 0–53)
AST: 26 U/L (ref 0–37)
BILIRUBIN DIRECT: 0.1 mg/dL (ref 0.0–0.3)
Total Bilirubin: 1.1 mg/dL (ref 0.2–1.2)
Total Protein: 6.5 g/dL (ref 6.0–8.3)

## 2014-03-10 LAB — LIPID PANEL
Cholesterol: 130 mg/dL (ref 0–200)
HDL: 45.4 mg/dL (ref >39.00)
LDL CALC: 67 mg/dL (ref 0–99)
NONHDL: 84.6
Total CHOL/HDL Ratio: 3
Triglycerides: 88 mg/dL (ref 0.0–149.0)
VLDL: 17.6 mg/dL (ref 0.0–40.0)

## 2014-03-10 LAB — TSH: TSH: 6.28 u[IU]/mL — AB (ref 0.35–4.50)

## 2014-03-10 NOTE — Assessment & Plan Note (Signed)
He has not had any recurrent atrial fibrillation.  We will reduce his amiodarone to just 100 mg daily.

## 2014-03-10 NOTE — Progress Notes (Signed)
[place] Date of Birth:  1946/03/15 [facility] [address] [place], [place]  [address] [phone]  Fax   [phone]  HPI: This pleasant 68 year old gentleman is seen for a scheduled followup office visit. The patient had successful cardioversion on 06/25/13. He has a history of left atrial myxoma and underwent successful surgical removal by Dr. Roxan Hockey on 04/09/13. He was in normal sinus rhythm at discharge. After returning home the patient went into atrial fibrillation with rapid ventricular response.  He was placed on Zarrella toe.  He then underwent successful direct current cardioversion as an outpatient on 06/25/13.  Since then he has remained in normal sinus rhythm on low dose amiodarone.  His Xarelto was expensive for him and we switched him back to baby aspirin.  He has been feeling well since last visit with no cardiac symptoms.  His blood pressure is high today which surprised him.  At home his blood pressure is running low normal.  His mother-in-law died last week and is being very later today.  She was 70 years old.   Current Outpatient Prescriptions  Medication Sig Dispense Refill  . amiodarone (PACERONE) 200 MG tablet Take 200 mg by mouth as directed. 1/2 daily      . aspirin 81 MG tablet Take 81 mg by mouth daily.      Marland Kitchen atorvastatin (LIPITOR) 80 MG tablet Take 0.5 tablets (40 mg total) by mouth every evening.  90 tablet  3  . celecoxib (CELEBREX) 200 MG capsule Take 200 mg by mouth as needed.      . metoprolol (LOPRESSOR) 50 MG tablet Take 50 mg by mouth as directed. 1/2 tablet twice a day       No current facility-administered medications for this visit.    No Known Allergies  Patient Active Problem List   Diagnosis Date Noted  . PAF (paroxysmal atrial fibrillation) 04/29/2013  . Myxoma of heart 04/07/2013  . NSTEMI  04/06/2013  . HTN (hypertension) 04/05/2013  . Syncope 04/05/2013  . Benign hypertensive heart disease without  heart failure 06/22/2011  . Pure hypercholesterolemia 06/22/2011    History  Smoking status  . Former Smoker  . Quit date: 07/10/1977  Smokeless tobacco  . Not on file    History  Alcohol Use No    Family History  Problem Relation Age of Onset  . Cancer Father     Review of Systems: The patient denies any heat or cold intolerance.  No weight gain or weight loss.  The patient denies headaches or blurry vision.  There is no cough or sputum production.  The patient denies dizziness.  There is no hematuria or hematochezia.  The patient denies any muscle aches or arthritis.  The patient denies any rash.  The patient denies frequent falling or instability.  There is no history of depression or anxiety.  All other systems were reviewed and are negative.   Physical Exam: Filed Vitals:   03/10/14 0817  BP: 160/90  Pulse: 45   recheck of his blood pressure is 150/80. The patient appears to be in no distress.  Head and neck exam reveals that the pupils are equal and reactive.  The extraocular movements are full.  There is no scleral icterus.  Mouth and pharynx are benign.  No lymphadenopathy.  No carotid bruits.  The jugular venous pressure is normal.  Thyroid is not enlarged or tender.  Chest is clear to percussion and auscultation.  No rales  or rhonchi.  Expansion of the chest is symmetrical.  Heart reveals no abnormal lift or heave.  First and second heart sounds are normal.  There is no murmur gallop rub or click.  The abdomen is soft and nontender.  Bowel sounds are normoactive.  There is no hepatosplenomegaly or mass.  There are no abdominal bruits.  Extremities reveal no phlebitis or edema.  Pedal pulses are good.  There is no cyanosis or clubbing.  Neurologic exam is normal strength and no lateralizing weakness.  No sensory deficits.  Integument reveals no rash  EKG shows marked sinus bradycardia 45 per minute.  Normal intervals.   Assessment / Plan: 1. history of left  atrial myxoma successfully excised on 04/09/13 2. paroxysmal atrial fibrillation resolved 3. hypertensive heart disease without heart failure 4. Hypercholesterolemia 5. Osteoarthritis  Disposition: Continue same medication except decrease metoprolol to just 25 mg twice a day.  Decrease amiodarone to 100 mg daily.  Lab work today pending.  Recheck in 4 months.

## 2014-03-10 NOTE — Assessment & Plan Note (Signed)
The patient is on Lipitor 40 mg daily.  No myalgias.  We are checking lab work today.

## 2014-03-10 NOTE — Assessment & Plan Note (Signed)
The patient has not been experiencing any dizziness or syncope.  No shortness of breath.  Continue current medication and low salt diet.

## 2014-03-10 NOTE — Patient Instructions (Signed)
Will obtain labs today and call you with the results (lp/bmet/hfp/tsh)  DECREASE YOUR AMIODARONE TO 1/2 TABLET DAILY  DECREASE LOPRESSOR (METOPROLOL) TO 50 MG 1/2 TWICE A DAY  Your physician wants you to follow-up in: [place] will receive a reminder letter in the mail two months in advance. If you don't receive a letter, please call our office to schedule the follow-up appointment.

## 2014-03-12 ENCOUNTER — Telehealth: Payer: Self-pay | Admitting: *Deleted

## 2014-03-12 NOTE — Telephone Encounter (Signed)
ID # [id number]  [phone]  Order # [id number] 7-10 days to receive

## 2014-03-12 NOTE — Telephone Encounter (Signed)
Patients wife requests order of celebrex from [facility] for patient. She stated that you already have the necessary information to order this. Thanks, MI

## 2014-03-17 ENCOUNTER — Telehealth: Payer: Self-pay | Admitting: *Deleted

## 2014-03-17 DIAGNOSIS — Z79899 Other long term (current) drug therapy: Secondary | ICD-10-CM

## 2014-03-17 NOTE — Telephone Encounter (Signed)
Message copied by Earvin Hansen on Tue Mar 17, 2014  2:36 PM ------      Message from: [name]      Created: Tue Mar 10, 2014  9:52 PM       Labs stable.  TSH slightly high. This should improve with lower dose of amiodarone.  Recheck TSH and FT4 at next OV ------

## 2014-03-17 NOTE — Telephone Encounter (Signed)
Orders placed   Notes Recorded by Alcario Drought, CMA on 03/13/2014 at 10:06 AM Pt notified. Note put on recall for tsh and FT4 to be checked at next OV.

## 2014-03-23 ENCOUNTER — Telehealth: Payer: Self-pay

## 2014-03-23 NOTE — Telephone Encounter (Signed)
LEFT MESSAGE TO LET PATIENT KNOW THAT HIS CLELBREX WAS HERE AT THE OFFICE

## 2014-03-31 DIAGNOSIS — Z85820 Personal history of malignant melanoma of skin: Secondary | ICD-10-CM | POA: Diagnosis not present

## 2014-03-31 DIAGNOSIS — L821 Other seborrheic keratosis: Secondary | ICD-10-CM | POA: Diagnosis not present

## 2014-03-31 DIAGNOSIS — L819 Disorder of pigmentation, unspecified: Secondary | ICD-10-CM | POA: Diagnosis not present

## 2014-03-31 DIAGNOSIS — D235 Other benign neoplasm of skin of trunk: Secondary | ICD-10-CM | POA: Diagnosis not present

## 2014-06-01 ENCOUNTER — Other Ambulatory Visit: Payer: Self-pay | Admitting: Orthopedic Surgery

## 2014-06-01 DIAGNOSIS — M75101 Unspecified rotator cuff tear or rupture of right shoulder, not specified as traumatic: Secondary | ICD-10-CM | POA: Diagnosis not present

## 2014-06-01 DIAGNOSIS — M19172 Post-traumatic osteoarthritis, left ankle and foot: Secondary | ICD-10-CM | POA: Diagnosis not present

## 2014-06-01 DIAGNOSIS — M25511 Pain in right shoulder: Secondary | ICD-10-CM

## 2014-06-08 ENCOUNTER — Other Ambulatory Visit: Payer: Self-pay | Admitting: Cardiology

## 2014-06-10 ENCOUNTER — Telehealth: Payer: Self-pay | Admitting: *Deleted

## 2014-06-10 NOTE — Telephone Encounter (Signed)
New Message  Pt does not take flu shots

## 2014-06-10 NOTE — Telephone Encounter (Signed)
New Message  Pt was asking about Celebrex. Please call back and discuss.

## 2014-06-11 NOTE — Telephone Encounter (Signed)
CELEBREX 200 MG CAPSULES Will be shipped to our office ID # [id number] automated re-ordering system Allow 7-10 days Order #[id number]

## 2014-06-12 ENCOUNTER — Ambulatory Visit
Admission: RE | Admit: 2014-06-12 | Discharge: 2014-06-12 | Disposition: A | Discharge status: Home / Self Care | Payer: Medicare Other | Source: Ambulatory Visit | Attending: Orthopedic Surgery | Admitting: Orthopedic Surgery

## 2014-06-12 DIAGNOSIS — M25511 Pain in right shoulder: Secondary | ICD-10-CM

## 2014-06-12 DIAGNOSIS — M7551 Bursitis of right shoulder: Secondary | ICD-10-CM | POA: Diagnosis not present

## 2014-06-18 ENCOUNTER — Telehealth: Payer: Self-pay

## 2014-06-18 ENCOUNTER — Encounter (HOSPITAL_COMMUNITY): Payer: Self-pay | Admitting: Cardiovascular Disease

## 2014-06-18 NOTE — Telephone Encounter (Signed)
Called to let patient know that his celebrex was here at the office

## 2014-06-19 NOTE — Telephone Encounter (Signed)
Matthew Sullivan at 06/18/2014 3:34 PM     Status: Signed       Expand All Collapse All   Called to let patient know that his celebrex was here at the office

## 2014-07-15 ENCOUNTER — Encounter: Payer: Self-pay | Admitting: Cardiology

## 2014-07-15 ENCOUNTER — Ambulatory Visit (INDEPENDENT_AMBULATORY_CARE_PROVIDER_SITE_OTHER): Payer: Medicare Other | Admitting: Cardiology

## 2014-07-15 VITALS — BP 150/82 | HR 50 | Ht 72.0 in | Wt 223.0 lb

## 2014-07-15 DIAGNOSIS — I10 Essential (primary) hypertension: Secondary | ICD-10-CM

## 2014-07-15 DIAGNOSIS — I480 Paroxysmal atrial fibrillation: Secondary | ICD-10-CM | POA: Diagnosis not present

## 2014-07-15 DIAGNOSIS — R0789 Other chest pain: Secondary | ICD-10-CM | POA: Diagnosis not present

## 2014-07-15 DIAGNOSIS — D151 Benign neoplasm of heart: Secondary | ICD-10-CM | POA: Diagnosis not present

## 2014-07-15 DIAGNOSIS — I119 Hypertensive heart disease without heart failure: Secondary | ICD-10-CM | POA: Diagnosis not present

## 2014-07-15 NOTE — Patient Instructions (Signed)
Your physician has requested that you have an echocardiogram. Echocardiography is a painless test that uses sound waves to create images of your heart. It provides your doctor with information about the size and shape of your heart and how well your heart's chambers and valves are working. This procedure takes approximately one hour. There are no restrictions for this procedure.  DECREASE YOUR METOPROLOL 50 MG TO 1/2 TABLET IN THE MORNING  Your physician wants you to follow-up in: [place] will receive a reminder letter in the mail two months in advance. If you don't receive a letter, please call our office to schedule the follow-up appointment.

## 2014-07-15 NOTE — Progress Notes (Signed)
Jennings Date of Birth:  1945-07-19 [facility] [address] [place], [place]  [address] [phone]  Fax   [phone]  HPI: This pleasant 69 year old gentleman is seen for a work in office visit.  He was up in [facility] this morning where his wife was having a postoperative check after ask surgery by Dr. Glenna Fellows.  The patient himself complained of some dizziness and chest tightness and Dr. Carloyn Manner asked if we could check the patient today which we were happy to do.  The patient has a past history of left atrial myxoma removed a little more than a year ago.  At the time of his preoperative cardiac catheterization, he had large normal coronary arteries.  He had postoperative atrial fibrillation. The patient had successful cardioversion on 06/25/13. He has a history of left atrial myxoma and underwent successful surgical removal by Dr. Roxan Hockey on 04/09/13. He was in normal sinus rhythm at discharge. After returning home the patient went into atrial fibrillation with rapid ventricular response.  He was placed on Xarelto.  He then underwent successful direct current cardioversion as an outpatient on 06/25/13.  Since then he has remained in normal sinus rhythm on low dose amiodarone.  His Xarelto was expensive for him and we switched him back to baby aspirin.  Recently he has been under a lot of stress.  His granddaughter 47 months old died unexpectedly and tragic several months ago.  Also his favorite uncle died.  Also his wife is presently at [facility] recovering from back surgery.  The patient has not been getting as much regular exercise as usual.  Current Outpatient Prescriptions  Medication Sig Dispense Refill  . amiodarone (PACERONE) 200 MG tablet Take 200 mg by mouth as directed. 1/2 daily    . aspirin 81 MG tablet Take 81 mg by mouth daily.    Marland Kitchen atorvastatin (LIPITOR) 80 MG tablet TAKE 1/2 TABLET BY MOUTH EVERY EVENING. 45 tablet 0  . celecoxib  (CELEBREX) 200 MG capsule Take 200 mg by mouth as needed.    . metoprolol (LOPRESSOR) 50 MG tablet Take 50 mg by mouth as directed. 1/2 tablet EVERY MORNING     No current facility-administered medications for this visit.    No Known Allergies  Patient Active Problem List   Diagnosis Date Noted  . PAF (paroxysmal atrial fibrillation) 04/29/2013  . Myxoma of heart 04/07/2013  . NSTEMI  04/06/2013  . HTN (hypertension) 04/05/2013  . Syncope 04/05/2013  . Benign hypertensive heart disease without heart failure 06/22/2011  . Pure hypercholesterolemia 06/22/2011    History  Smoking status  . Former Smoker  . Quit date: 07/10/1977  Smokeless tobacco  . Not on file    History  Alcohol Use No    Family History  Problem Relation Age of Onset  . Cancer Father     Review of Systems: The patient denies any heat or cold intolerance.  No weight gain or weight loss.  The patient denies headaches or blurry vision.  There is no cough or sputum production.  The patient denies dizziness.  There is no hematuria or hematochezia.  The patient denies any muscle aches or arthritis.  The patient denies any rash.  The patient denies frequent falling or instability.  There is no history of depression or anxiety.  All other systems were reviewed and are negative.   Physical Exam: Filed Vitals:   07/15/14 1332  BP: 150/82  Pulse:  50   The patient appears to be in no distress.  He appears anxious   Head and neck exam reveals that the pupils are equal and reactive.  The extraocular movements are full.  There is no scleral icterus.  Mouth and pharynx are benign.  No lymphadenopathy.  No carotid bruits.  The jugular venous pressure is normal.  Thyroid is not enlarged or tender.  Chest is clear to percussion and auscultation.  No rales or rhonchi.  Expansion of the chest is symmetrical.  Heart reveals no abnormal lift or heave.  First and second heart sounds are normal.  There is no murmur gallop  rub or click.  The abdomen is soft and nontender.  Bowel sounds are normoactive.  There is no hepatosplenomegaly or mass.  There are no abdominal bruits.  Extremities reveal no phlebitis or edema.  Pedal pulses are good.  There is no cyanosis or clubbing.  Neurologic exam is normal strength and no lateralizing weakness.  No sensory deficits.  Integument reveals no rash  EKG shows marked sinus bradycardia 50 per minute.  Normal intervals.  Since the previous tracing on 03/10/14, no significant change.  Assessment / Plan: 1. history of left atrial myxoma successfully excised on 04/09/13 2. paroxysmal atrial fibrillation resolved 3. hypertensive heart disease without heart failure.  His systolic blood pressure is high today which I believe is related to stress.  He has had some mild lightheadedness.  He had this prior to discovery of his myxoma 15 months ago.  We will update his echocardiogram. 4. Hypercholesterolemia 5. Osteoarthritis  Disposition: Because of his bradycardia we will cut back on his metoprolol 50 mg tablet to just one half tablet once a day in the morning.  He will monitor his blood pressure at home.  If it remains high we will consider adding a low-dose ACE inhibitor or ARB. We will update his echocardiogram looking for any recurrence of his left atrial myxoma. Recheck in 4 months or sooner when necessary

## 2014-07-16 ENCOUNTER — Ambulatory Visit: Payer: Medicare Other | Admitting: Cardiology

## 2014-07-23 ENCOUNTER — Ambulatory Visit (HOSPITAL_COMMUNITY): Payer: Medicare Other | Attending: Cardiology

## 2014-07-23 DIAGNOSIS — E785 Hyperlipidemia, unspecified: Secondary | ICD-10-CM | POA: Diagnosis not present

## 2014-07-23 DIAGNOSIS — Z720 Tobacco use: Secondary | ICD-10-CM | POA: Insufficient documentation

## 2014-07-23 DIAGNOSIS — I10 Essential (primary) hypertension: Secondary | ICD-10-CM | POA: Diagnosis not present

## 2014-07-23 DIAGNOSIS — D151 Benign neoplasm of heart: Secondary | ICD-10-CM | POA: Insufficient documentation

## 2014-07-23 DIAGNOSIS — I480 Paroxysmal atrial fibrillation: Secondary | ICD-10-CM

## 2014-07-23 NOTE — Progress Notes (Signed)
2D Echo completed. 07/23/2014

## 2014-07-24 ENCOUNTER — Telehealth: Payer: Self-pay | Admitting: Cardiology

## 2014-07-24 NOTE — Telephone Encounter (Signed)
Advised patient

## 2014-07-24 NOTE — Telephone Encounter (Signed)
-----   Message from [name], MD sent at 07/23/2014  1:20 PM EST ----- Please report.  The echo is good.  No recurrence of myxoma.  LV systolic function is normal.

## 2014-07-24 NOTE — Telephone Encounter (Signed)
New message  ° ° °Patient calling back to speak with nurse  °

## 2014-08-10 ENCOUNTER — Ambulatory Visit: Payer: Medicare Other | Admitting: Cardiology

## 2014-08-25 DIAGNOSIS — M75101 Unspecified rotator cuff tear or rupture of right shoulder, not specified as traumatic: Secondary | ICD-10-CM | POA: Diagnosis not present

## 2014-08-26 ENCOUNTER — Other Ambulatory Visit: Payer: Self-pay | Admitting: Cardiology

## 2014-08-27 ENCOUNTER — Telehealth: Payer: Self-pay | Admitting: Cardiology

## 2014-08-27 ENCOUNTER — Other Ambulatory Visit: Payer: Self-pay | Admitting: Cardiology

## 2014-08-27 NOTE — Telephone Encounter (Signed)
Received request from Nurse fax box, documents faxed for surgical clearance. To: [facility] Fax number: [phone] Attention: [phone]/km

## 2014-09-29 DIAGNOSIS — Z08 Encounter for follow-up examination after completed treatment for malignant neoplasm: Secondary | ICD-10-CM | POA: Diagnosis not present

## 2014-09-29 DIAGNOSIS — D225 Melanocytic nevi of trunk: Secondary | ICD-10-CM | POA: Diagnosis not present

## 2014-09-29 DIAGNOSIS — L821 Other seborrheic keratosis: Secondary | ICD-10-CM | POA: Diagnosis not present

## 2014-09-29 DIAGNOSIS — L570 Actinic keratosis: Secondary | ICD-10-CM | POA: Diagnosis not present

## 2014-09-29 DIAGNOSIS — Z85820 Personal history of malignant melanoma of skin: Secondary | ICD-10-CM | POA: Diagnosis not present

## 2014-10-22 DIAGNOSIS — M25561 Pain in right knee: Secondary | ICD-10-CM | POA: Diagnosis not present

## 2014-10-22 DIAGNOSIS — M25572 Pain in left ankle and joints of left foot: Secondary | ICD-10-CM | POA: Diagnosis not present

## 2014-11-24 ENCOUNTER — Encounter: Payer: Self-pay | Admitting: Cardiology

## 2014-11-24 ENCOUNTER — Ambulatory Visit (INDEPENDENT_AMBULATORY_CARE_PROVIDER_SITE_OTHER): Payer: Medicare Other | Admitting: Cardiology

## 2014-11-24 VITALS — BP 140/78 | HR 57 | Ht 72.0 in | Wt 217.8 lb

## 2014-11-24 DIAGNOSIS — I480 Paroxysmal atrial fibrillation: Secondary | ICD-10-CM | POA: Diagnosis not present

## 2014-11-24 DIAGNOSIS — I119 Hypertensive heart disease without heart failure: Secondary | ICD-10-CM

## 2014-11-24 NOTE — Progress Notes (Signed)
Cardiology Office Note   Date:  11/24/2014   ID:  [name], [name] Feb 10, 1946, MRN [id number]  PCP:  Warren Danes, MD  Cardiologist: [name] MD  No chief complaint on file.     History of Present Illness: Matthew Sullivan is a 69 y.o. male who presents for follow-up scheduled office visit The patient has a past history of left atrial myxoma removed by Dr. Roxan Hockey on 04/09/13 .the patient does not have any coronary disease by cardiac catheterization .  The patient did have problems with postoperative atrial fibrillation.  He underwent elective cardioversion on 06/25/13 .  Since then he has remained in normal sinus rhythm on tapering doses of amiodarone.  He currently is taking amiodarone 100 mg daily.  He has not been aware of any recurrent atrial fibrillation.  He denies chest pain or shortness of breath   Past Medical History  Diagnosis Date  . Hypertension   . Hyperlipidemia   . History of tinnitus   . Cervical osteoarthritis   . MI, old 65  . History of embolic stroke   . Melanoma   . Atrial myxoma   . Stroke     "TIA 10 years ago"    Past Surgical History  Procedure Laterality Date  . Back surgery    . Appendectomy    . Nose surgery    . Nasal septum surgery    . Excision of atrial myxoma Left 04/09/2013    Procedure: EXCISION OF ATRIAL MYXOMA;  Surgeon: [name], MD;  Location: [facility];  Service: Open Heart Surgery;  Laterality: Left;  . Intraoperative transesophageal echocardiogram N/A 04/09/2013    Procedure: INTRAOPERATIVE TRANSESOPHAGEAL ECHOCARDIOGRAM;  Surgeon: [name], MD;  Location: [facility];  Service: Open Heart Surgery;  Laterality: N/A;  . Rotator cuff repair      both shoulders  . Cardioversion N/A 06/25/2013    Procedure: CARDIOVERSION;  Surgeon: [name], MD;  Location: [facility] ENDOSCOPY;  Service: Cardiovascular;  Laterality: N/A;  . Left heart catheterization with coronary angiogram N/A 04/07/2013   Procedure: LEFT HEART CATHETERIZATION WITH CORONARY ANGIOGRAM;  Surgeon: [name], MD;  Location: [facility] CATH LAB;  Service: Cardiovascular;  Laterality: N/A;       Allergies:   Review of patient's allergies indicates no known allergies.    Social History:  The patient  reports that he quit smoking about 37 years ago. He does not have any smokeless tobacco history on file. He reports that he does not drink alcohol or use illicit drugs.   Family History:  The patient's family history includes Cancer in his father.    ROS:  Please see the history of present illness.   Otherwise, review of systems are positive for none.   All other systems are reviewed and negative.    PHYSICAL EXAM: VS:  BP 140/78 mmHg  Pulse 57  Ht 6' (1.829 m)  Wt 217 lb 12.8 oz (98.793 kg)  BMI 29.53 kg/m2 , BMI Body mass index is 29.53 kg/(m^2). GEN: Well nourished, well developed, in no acute distress HEENT: normal Neck: no JVD, carotid bruits, or masses Cardiac: RRR; no murmurs, rubs, or gallops,no edema  Respiratory:  clear to auscultation bilaterally, normal work of breathing GI: soft, nontender, nondistended, + BS MS: no deformity or atrophy Skin: warm and dry, no rash Neuro:  Strength and sensation are intact Psych: euthymic mood, full affect   EKG:  EKG is not ordered today.  Recent Labs: 03/10/2014: ALT 32; BUN 13; Creatinine 1.1; Potassium 4.0; Sodium 139; TSH 6.28*    Lipid Panel    Component Value Date/Time   CHOL 130 03/10/2014 0848   TRIG 88.0 03/10/2014 0848   HDL 45.40 03/10/2014 0848   CHOLHDL 3 03/10/2014 0848   VLDL 17.6 03/10/2014 0848   LDLCALC 67 03/10/2014 0848      Wt Readings from Last 3 Encounters:  11/24/14 217 lb 12.8 oz (98.793 kg)  07/15/14 223 lb (101.152 kg)  03/10/14 220 lb 12.8 oz (100.154 kg)         ASSESSMENT AND PLAN:  1. history of left atrial myxoma successfully excised on 04/09/13 2. paroxysmal atrial fibrillation resolved 3.  hypertensive heart disease without heart failure.  4. Hypercholesterolemia 5. Osteoarthritis  Disposition: We reviewed the results of his recent echocardiogram done on 07/23/14 which did not show any evidence of recurrent left atrial myxoma.  Ventricular function is normal.    Current medicines are reviewed at length with the patient today.  The patient does not have concerns regarding medicines.  The following changes have been made:  We are stopping his amiodarone today.  He is anxious to get off as many medicines as possible.  He understands that if he goes back into atrial fibrillation he will need to go back on amiodarone and on anticoagulation.   Labs/ tests ordered today include:   Orders Placed This Encounter  Procedures  . Lipid panel  . Hepatic function panel  . Basic metabolic panel     Disposition: We will have him return in 4 months for office visit EKG lipid panel hepatic function panel and basal metabolic panel.   Stop amiodarone at this point  Signed, [name] MD 11/24/2014 1:17 PM    [address], [place], [place]  [address] Phone: [phone]; Fax: [phone]

## 2014-11-24 NOTE — Patient Instructions (Signed)
Medication Instructions:  STOP AMIODARONE   Labwork: NONE  Testing/Procedures: NONE  Follow-Up: Your physician recommends that you schedule a follow-up appointment in: 4 months with fasting labs (lp/bmet/hfp) AND EKG   Any Other Special Instructions Will Be Listed Below (If Applicable).

## 2014-12-16 ENCOUNTER — Other Ambulatory Visit: Payer: Self-pay | Admitting: Cardiology

## 2014-12-17 ENCOUNTER — Other Ambulatory Visit: Payer: Self-pay | Admitting: *Deleted

## 2015-01-07 DIAGNOSIS — M19072 Primary osteoarthritis, left ankle and foot: Secondary | ICD-10-CM | POA: Diagnosis not present

## 2015-04-09 ENCOUNTER — Ambulatory Visit (INDEPENDENT_AMBULATORY_CARE_PROVIDER_SITE_OTHER): Payer: Medicare Other | Admitting: Cardiology

## 2015-04-09 ENCOUNTER — Encounter: Payer: Self-pay | Admitting: Cardiology

## 2015-04-09 ENCOUNTER — Other Ambulatory Visit (INDEPENDENT_AMBULATORY_CARE_PROVIDER_SITE_OTHER): Payer: Medicare Other | Admitting: *Deleted

## 2015-04-09 VITALS — BP 140/88 | HR 58 | Ht 72.0 in | Wt 218.8 lb

## 2015-04-09 DIAGNOSIS — I480 Paroxysmal atrial fibrillation: Secondary | ICD-10-CM

## 2015-04-09 DIAGNOSIS — D151 Benign neoplasm of heart: Secondary | ICD-10-CM

## 2015-04-09 DIAGNOSIS — E7800 Pure hypercholesterolemia, unspecified: Secondary | ICD-10-CM

## 2015-04-09 DIAGNOSIS — I119 Hypertensive heart disease without heart failure: Secondary | ICD-10-CM

## 2015-04-09 LAB — HEPATIC FUNCTION PANEL
ALT: 29 U/L (ref 0–53)
AST: 23 U/L (ref 0–37)
Albumin: 4.2 g/dL (ref 3.5–5.2)
Alkaline Phosphatase: 54 U/L (ref 39–117)
BILIRUBIN DIRECT: 0.2 mg/dL (ref 0.0–0.3)
BILIRUBIN TOTAL: 0.8 mg/dL (ref 0.2–1.2)
Total Protein: 6.3 g/dL (ref 6.0–8.3)

## 2015-04-09 LAB — LIPID PANEL
CHOLESTEROL: 124 mg/dL (ref 0–200)
HDL: 42.7 mg/dL (ref >39.00)
LDL CALC: 67 mg/dL (ref 0–99)
NONHDL: 81.56
Total CHOL/HDL Ratio: 3
Triglycerides: 73 mg/dL (ref 0.0–149.0)
VLDL: 14.6 mg/dL (ref 0.0–40.0)

## 2015-04-09 LAB — BASIC METABOLIC PANEL
BUN: 12 mg/dL (ref 6–23)
CO2: 31 meq/L (ref 19–32)
Calcium: 8.8 mg/dL (ref 8.4–10.5)
Chloride: 106 mEq/L (ref 96–112)
Creatinine, Ser: 0.88 mg/dL (ref 0.40–1.50)
GFR: 90.78 mL/min (ref >60.00)
GLUCOSE: 103 mg/dL — AB (ref 70–99)
POTASSIUM: 4.3 meq/L (ref 3.5–5.1)
SODIUM: 141 meq/L (ref 135–145)

## 2015-04-09 NOTE — Progress Notes (Signed)
Quick Note:  Please report to patient. The recent labs are stable. Continue same medication and careful diet. ______ 

## 2015-04-09 NOTE — Patient Instructions (Signed)
Medication Instructions:  Your physician recommends that you continue on your current medications as directed. Please refer to the Current Medication list given to you today.  Labwork: none  Testing/Procedures: none  Follow-Up: Your physician wants you to follow-up in: 6 month ov You will receive a reminder letter in the mail two months in advance. If you don't receive a letter, please call our office to schedule the follow-up appointment.    

## 2015-04-09 NOTE — Progress Notes (Signed)
Cardiology Office Note   Date:  04/09/2015   ID:  Matthew Sullivan, Matthew Sullivan March 20, 1946, MRN [id number]  PCP:  Warren Danes, MD  Cardiologist: [name] MD  No chief complaint on file.     History of Present Illness: Matthew Sullivan is a 69 y.o. male who presents for a six-month follow-up office visit.  Matthew Sullivan is a 69 y.o. male who presents for follow-up scheduled office visit The patient has a past history of left atrial myxoma removed by Dr. Roxan Hockey on 04/09/13 The patient does not have any coronary disease by cardiac catheterization . The patient did have problems with postoperative atrial fibrillation. He underwent elective cardioversion on 06/25/13 . Since then he has remained in normal sinus rhythm on tapering doses of amiodarone. At his last visit 4 months ago we stopped his amiodarone altogether. He has not been aware of any recurrent atrial fibrillation. He denies chest pain or shortness of breath.  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 He has chronic pain in his left ankle which is a residual from a staph infection that he had in his left ankle in 1958.  His orthopedist tells him that he needs to have his ankle fused but he is not yet willing to make that commitment.  At the present time he wears an ankle brace. He has a history of hyperlipidemia.  His most recent lipids were September 2015 and his LDL was satisfactory at 68.  Past Medical History  Diagnosis Date  . Hypertension   . Hyperlipidemia   . History of tinnitus   . Cervical osteoarthritis   . MI, old 40  . History of embolic stroke   . Melanoma   . Atrial myxoma   . Stroke     "TIA 10 years ago"    Past Surgical History  Procedure Laterality Date  . Back surgery    . Appendectomy    . Nose surgery    . Nasal septum surgery    . Excision of atrial myxoma Left 04/09/2013    Procedure: EXCISION OF ATRIAL MYXOMA;  Surgeon: [name], MD;  Location: [facility];  Service: Open Heart Surgery;  Laterality: Left;  . Intraoperative transesophageal echocardiogram N/A 04/09/2013    Procedure: INTRAOPERATIVE TRANSESOPHAGEAL ECHOCARDIOGRAM;  Surgeon: [name], MD;  Location: [facility];  Service: Open Heart Surgery;  Laterality: N/A;  . Rotator cuff repair      both shoulders  . Cardioversion N/A 06/25/2013    Procedure: CARDIOVERSION;  Surgeon: [name], MD;  Location: [facility] ENDOSCOPY;  Service: Cardiovascular;  Laterality: N/A;  . Left heart catheterization with coronary angiogram N/A 04/07/2013    Procedure: LEFT HEART CATHETERIZATION WITH CORONARY ANGIOGRAM;  Surgeon: [name], MD;  Location: [facility] CATH LAB;  Service: Cardiovascular;  Laterality: N/A;     Current Outpatient Prescriptions  Medication Sig Dispense Refill  . aspirin 81 MG tablet Take 81 mg by mouth daily.    Marland Kitchen atorvastatin (LIPITOR) 80 MG tablet Take 40 mg by mouth daily.    . celecoxib (CELEBREX) 200 MG capsule Take 200 mg by mouth as needed (joint pain).     . metoprolol (LOPRESSOR) 50 MG tablet Take 50 mg by mouth as directed. Take 1/2 tablet by mouth every morning     No current facility-administered medications for this visit.    Allergies:   Review of patient's allergies indicates no known allergies.  Social History:  The patient  reports that he quit smoking about 37 years ago. He does not have any smokeless tobacco history on file. He reports that he does not drink alcohol or use illicit drugs.   Family History:  The patient's family history includes Cancer in his father.    ROS:  Please see the history of present illness.   Otherwise, review of systems are positive for none.   All other systems are reviewed and negative.    PHYSICAL EXAM: VS:  BP 140/88 mmHg  Pulse 58  Ht 6' (1.829 m)  Wt 218 lb 12.8 oz (99.247 kg)  BMI 29.67 kg/m2 , BMI Body mass index is 29.67 kg/(m^2). GEN: Well nourished, well developed, in no acute distress HEENT:  normal Neck: no JVD, carotid bruits, or masses Cardiac: RRR; no murmurs, rubs, or gallops,no edema  Respiratory:  clear to auscultation bilaterally, normal work of breathing GI: soft, nontender, nondistended, + BS MS: no deformity or atrophy Skin: warm and dry, no rash Neuro:  Strength and sensation are intact Psych: euthymic mood, full affect   EKG:  EKG is ordered today. The ekg ordered today demonstrates sinus bradycardia at 58/m.  Otherwise within normal limits.   Recent Labs: 04/09/2015: ALT 29; BUN 12; Creatinine, Ser 0.88; Potassium 4.3; Sodium 141    Lipid Panel    Component Value Date/Time   CHOL 124 04/09/2015 0919   TRIG 73.0 04/09/2015 0919   HDL 42.70 04/09/2015 0919   CHOLHDL 3 04/09/2015 0919   VLDL 14.6 04/09/2015 0919   LDLCALC 67 04/09/2015 0919      Wt Readings from Last 3 Encounters:  04/09/15 218 lb 12.8 oz (99.247 kg)  11/24/14 217 lb 12.8 oz (98.793 kg)  07/15/14 223 lb (101.152 kg)        ASSESSMENT AND PLAN:  1. history of left atrial myxoma successfully excised on 04/09/13 2. paroxysmal atrial fibrillation resolved.  No recurrence off amiodarone 3. hypertensive heart disease without heart failure.  4. Hypercholesterolemia 5. 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  Recheck in 6 months for office visit.   Current medicines are reviewed at length with the patient today.  The patient does not have concerns regarding medicines.  The following changes have been made:  no change  Labs/ tests ordered today include:   Orders Placed This Encounter  Procedures  . Lipid panel  . Hepatic function panel  . Basic metabolic panel  . EKG 12-Lead       Signed, [name] MD 04/09/2015 6:41 PM    [facility] [place], [place], [place]  [address] Phone: [phone]; Fax: [phone]

## 2015-04-13 ENCOUNTER — Telehealth: Payer: Self-pay | Admitting: Cardiology

## 2015-04-13 NOTE — Telephone Encounter (Signed)
New message     Returning a call to a nurse 

## 2015-04-13 NOTE — Telephone Encounter (Signed)
Informed pt of lab results. Pt verbalized understanding. 

## 2015-04-13 NOTE — Telephone Encounter (Signed)
Left message to call back  

## 2015-05-27 DIAGNOSIS — M19072 Primary osteoarthritis, left ankle and foot: Secondary | ICD-10-CM | POA: Diagnosis not present

## 2015-07-07 ENCOUNTER — Encounter: Payer: Self-pay | Admitting: *Deleted

## 2015-07-15 ENCOUNTER — Other Ambulatory Visit: Payer: Self-pay | Admitting: Cardiology

## 2015-07-15 NOTE — Telephone Encounter (Signed)
Could you please clarify what patients current dose should be? Thanks, MI

## 2015-07-16 DIAGNOSIS — M25572 Pain in left ankle and joints of left foot: Secondary | ICD-10-CM | POA: Diagnosis not present

## 2015-08-06 ENCOUNTER — Telehealth: Payer: Self-pay

## 2015-08-06 NOTE — Telephone Encounter (Signed)
Pt clearance faxed to [facility] [phone] fax [phone] Ext [phone] Phone

## 2015-08-09 ENCOUNTER — Telehealth: Payer: Self-pay | Admitting: Cardiology

## 2015-08-09 NOTE — Telephone Encounter (Signed)
Surgical clearance was faxed on 08/06/15, confirmation received  Advised patient

## 2015-08-09 NOTE — Telephone Encounter (Signed)
Follow Up  Pt wife is calling to follow up on the surgical clearance

## 2015-08-16 ENCOUNTER — Other Ambulatory Visit: Payer: Self-pay | Admitting: Physician Assistant

## 2015-08-16 NOTE — H&P (Addendum)
Matthew Sullivan comes in for evaluation and treatment recommendation for ongoing worsening issues with his left ankle.  I met with he and his wife.  He has been seen in the past by Dr. Noemi Chapel.  He knows he has end stage dramatic arthritis.  He is coming to see me for discussion of surgical options.  He is aware that Dr. Noemi Chapel does not do ankle arthrodesis.  He is very stiff.  Rest pain and night pain.  He has gone back working doing [employer] working.  Anything uneven causes marked symptoms of locking and catching.  Even activities of daily living causes considerable pain.  He has a history of a remote staph infection in 1958.  He has had no recurrent infectious symptoms at all.  No new trauma.   History and general exam is reviewed.     EXAMINATION: Lungs clear to auscultation bilaterally.  Heart sounds normal.  Specifically, extremely stiff, painful left ankle.  No dorsiflexion, maybe 10 degrees of plantarflexion.  Very reasonable subtalar motion, midfoot motion and forefoot motion.  Some calf atrophy, not marked.  Neurovascularly intact otherwise.  X-RAYS: Repeat standing two view x-rays of his ankle shows that he is essentially bone on bone throughout.  The other joints look relatively well preserved.  He has a little bit of varus, but overall his alignment is not bad.  DISPOSITION:  I had a long extensive talk with [name] and his wife.  At this point in time something needs to be done as his symptoms are intolerable.  The two categories are arthroplasty and arthrodesis.  I have told him that based on what he does and his activity that I would not recommend a replacement arthroplasty.  I told him I would be happy to setup an appointment for him to talk with a surgeon who does these if he wants.  My recommendation however is to do an arthroscopic arthrodesis.  That procedure, risks, benefits and complications reviewed in detail.  Procedure, what to expect post-op explained.  I will let him be weight  bearing as tolerated as soon as comfort allows, 1-2 weeks.  I have told him however this is going to take a good 3-4 months before solidly fused.  I don't think I need to add any autograft or allograft.  Paperwork complete.  All questions answered.  We are going to proceed in the near future.  If he wants to get an opinion about a replacement I have told him I would help him with that.     [name], M.D.

## 2015-08-19 ENCOUNTER — Encounter (HOSPITAL_BASED_OUTPATIENT_CLINIC_OR_DEPARTMENT_OTHER): Payer: Self-pay | Admitting: *Deleted

## 2015-08-19 NOTE — Progress Notes (Signed)
EKG, PMH and Cardiology notes and clearance reviewed with Dr. Al Corpus. Pt will not need any further testing or labs prior to surgery 08/26/15.

## 2015-08-26 ENCOUNTER — Ambulatory Visit (HOSPITAL_BASED_OUTPATIENT_CLINIC_OR_DEPARTMENT_OTHER): Payer: Medicare Other | Admitting: Anesthesiology

## 2015-08-26 ENCOUNTER — Ambulatory Visit (HOSPITAL_BASED_OUTPATIENT_CLINIC_OR_DEPARTMENT_OTHER)
Admission: RE | Admit: 2015-08-26 | Discharge: 2015-08-26 | Disposition: A | Discharge status: Home / Self Care | Payer: Medicare Other | Source: Ambulatory Visit | Attending: Orthopedic Surgery | Admitting: Orthopedic Surgery

## 2015-08-26 ENCOUNTER — Encounter (HOSPITAL_BASED_OUTPATIENT_CLINIC_OR_DEPARTMENT_OTHER): Payer: Self-pay | Admitting: Anesthesiology

## 2015-08-26 ENCOUNTER — Encounter (HOSPITAL_BASED_OUTPATIENT_CLINIC_OR_DEPARTMENT_OTHER)
Admission: RE | Disposition: A | Discharge status: Home / Self Care | Payer: Self-pay | Source: Ambulatory Visit | Attending: Orthopedic Surgery

## 2015-08-26 DIAGNOSIS — G8918 Other acute postprocedural pain: Secondary | ICD-10-CM | POA: Diagnosis not present

## 2015-08-26 DIAGNOSIS — I10 Essential (primary) hypertension: Secondary | ICD-10-CM | POA: Diagnosis not present

## 2015-08-26 DIAGNOSIS — Z87891 Personal history of nicotine dependence: Secondary | ICD-10-CM | POA: Diagnosis not present

## 2015-08-26 DIAGNOSIS — Z7982 Long term (current) use of aspirin: Secondary | ICD-10-CM | POA: Insufficient documentation

## 2015-08-26 DIAGNOSIS — M25572 Pain in left ankle and joints of left foot: Secondary | ICD-10-CM | POA: Diagnosis not present

## 2015-08-26 DIAGNOSIS — I252 Old myocardial infarction: Secondary | ICD-10-CM | POA: Insufficient documentation

## 2015-08-26 DIAGNOSIS — M19072 Primary osteoarthritis, left ankle and foot: Secondary | ICD-10-CM | POA: Insufficient documentation

## 2015-08-26 DIAGNOSIS — Z79899 Other long term (current) drug therapy: Secondary | ICD-10-CM | POA: Insufficient documentation

## 2015-08-26 HISTORY — PX: ANKLE ARTHROSCOPY WITH ARTHRODESIS: SHX5579

## 2015-08-26 SURGERY — ARTHROSCOPY, ANKLE, WITH FUSION
Anesthesia: General | Site: Ankle | Laterality: Left

## 2015-08-26 MED ORDER — PROPOFOL 500 MG/50ML IV EMUL
INTRAVENOUS | Status: AC
  Filled 2015-08-26: qty 50

## 2015-08-26 MED ORDER — CEFAZOLIN SODIUM-DEXTROSE 2-3 GM-% IV SOLR
INTRAVENOUS | Status: AC
  Filled 2015-08-26: qty 50

## 2015-08-26 MED ORDER — PROMETHAZINE HCL 25 MG/ML IJ SOLN: 6.2500 mg | INTRAMUSCULAR | Status: DC | PRN

## 2015-08-26 MED ORDER — SCOPOLAMINE 1 MG/3DAYS TD PT72: 1.0000 | MEDICATED_PATCH | Freq: Once | TRANSDERMAL | Status: DC | PRN

## 2015-08-26 MED ORDER — OXYCODONE-ACETAMINOPHEN 5-325 MG PO TABS
1.0000 | ORAL_TABLET | ORAL | Status: DC | PRN
Start: 2015-08-26 — End: 2015-09-06

## 2015-08-26 MED ORDER — LACTATED RINGERS IV SOLN
INTRAVENOUS | Status: DC
  Administered 2015-08-26: 13:00:00 via INTRAVENOUS
  Administered 2015-08-26: 10 mL/h via INTRAVENOUS
  Administered 2015-08-26: 12:00:00 via INTRAVENOUS

## 2015-08-26 MED ORDER — ONDANSETRON HCL 4 MG PO TABS: 4.0000 mg | ORAL_TABLET | Freq: Three times a day (TID) | ORAL | Status: DC | PRN

## 2015-08-26 MED ORDER — FENTANYL CITRATE (PF) 100 MCG/2ML IJ SOLN
25.0000 ug | INTRAMUSCULAR | Status: DC | PRN
  Administered 2015-08-26: 25 ug via INTRAVENOUS
  Administered 2015-08-26: 50 ug via INTRAVENOUS

## 2015-08-26 MED ORDER — PROPOFOL 10 MG/ML IV BOLUS
INTRAVENOUS | Status: DC | PRN
  Administered 2015-08-26: 30 mg via INTRAVENOUS
  Administered 2015-08-26: 200 mg via INTRAVENOUS
  Administered 2015-08-26: 30 mg via INTRAVENOUS
  Administered 2015-08-26: 50 mg via INTRAVENOUS
  Administered 2015-08-26: 100 mg via INTRAVENOUS

## 2015-08-26 MED ORDER — MIDAZOLAM HCL 2 MG/2ML IJ SOLN
INTRAMUSCULAR | Status: AC
  Filled 2015-08-26: qty 2

## 2015-08-26 MED ORDER — SODIUM CHLORIDE 0.9 % IR SOLN
Status: DC | PRN
  Administered 2015-08-26: 3000 mL

## 2015-08-26 MED ORDER — CHLORHEXIDINE GLUCONATE 4 % EX LIQD: 60.0000 mL | Freq: Once | CUTANEOUS | Status: DC

## 2015-08-26 MED ORDER — FENTANYL CITRATE (PF) 100 MCG/2ML IJ SOLN
INTRAMUSCULAR | Status: AC
  Filled 2015-08-26: qty 2

## 2015-08-26 MED ORDER — LACTATED RINGERS IV SOLN: INTRAVENOUS | Status: DC

## 2015-08-26 MED ORDER — EPHEDRINE SULFATE 50 MG/ML IJ SOLN
INTRAMUSCULAR | Status: AC
  Filled 2015-08-26: qty 1

## 2015-08-26 MED ORDER — BUPIVACAINE-EPINEPHRINE (PF) 0.5% -1:200000 IJ SOLN
INTRAMUSCULAR | Status: DC | PRN
  Administered 2015-08-26: 20 mL via PERINEURAL

## 2015-08-26 MED ORDER — DEXAMETHASONE SODIUM PHOSPHATE 10 MG/ML IJ SOLN
INTRAMUSCULAR | Status: DC | PRN
  Administered 2015-08-26: 10 mg via INTRAVENOUS

## 2015-08-26 MED ORDER — DEXAMETHASONE SODIUM PHOSPHATE 10 MG/ML IJ SOLN
INTRAMUSCULAR | Status: AC
  Filled 2015-08-26: qty 1

## 2015-08-26 MED ORDER — KETOROLAC TROMETHAMINE 30 MG/ML IJ SOLN
INTRAMUSCULAR | Status: DC | PRN
  Administered 2015-08-26: 30 mg via INTRAVENOUS

## 2015-08-26 MED ORDER — MIDAZOLAM HCL 2 MG/2ML IJ SOLN
1.0000 mg | INTRAMUSCULAR | Status: DC | PRN
  Administered 2015-08-26: 1 mg via INTRAVENOUS

## 2015-08-26 MED ORDER — ONDANSETRON HCL 4 MG/2ML IJ SOLN
INTRAMUSCULAR | Status: DC | PRN
  Administered 2015-08-26: 4 mg via INTRAVENOUS

## 2015-08-26 MED ORDER — CEFAZOLIN SODIUM-DEXTROSE 2-3 GM-% IV SOLR
2.0000 g | INTRAVENOUS | Status: AC
  Administered 2015-08-26: 2 g via INTRAVENOUS

## 2015-08-26 MED ORDER — MEPERIDINE HCL 25 MG/ML IJ SOLN
6.2500 mg | INTRAMUSCULAR | Status: DC | PRN
Start: 2015-08-26 — End: 2015-08-26

## 2015-08-26 MED ORDER — GLYCOPYRROLATE 0.2 MG/ML IJ SOLN
INTRAMUSCULAR | Status: AC
  Filled 2015-08-26: qty 1

## 2015-08-26 MED ORDER — FENTANYL CITRATE (PF) 100 MCG/2ML IJ SOLN
50.0000 ug | INTRAMUSCULAR | Status: DC | PRN
  Administered 2015-08-26 (×2): 50 ug via INTRAVENOUS

## 2015-08-26 MED ORDER — GLYCOPYRROLATE 0.2 MG/ML IJ SOLN
0.2000 mg | Freq: Once | INTRAMUSCULAR | Status: AC | PRN
  Administered 2015-08-26: 0.2 mg via INTRAVENOUS

## 2015-08-26 MED ORDER — LIDOCAINE HCL (CARDIAC) 20 MG/ML IV SOLN
INTRAVENOUS | Status: AC
  Filled 2015-08-26: qty 5

## 2015-08-26 MED ORDER — ONDANSETRON HCL 4 MG/2ML IJ SOLN
INTRAMUSCULAR | Status: AC
  Filled 2015-08-26: qty 2

## 2015-08-26 MED ORDER — LIDOCAINE HCL (CARDIAC) 20 MG/ML IV SOLN
INTRAVENOUS | Status: DC | PRN
  Administered 2015-08-26: 50 mg via INTRAVENOUS

## 2015-08-26 MED ORDER — KETOROLAC TROMETHAMINE 30 MG/ML IJ SOLN
INTRAMUSCULAR | Status: AC
  Filled 2015-08-26: qty 1

## 2015-08-26 SURGICAL SUPPLY — 74 items
5MM CANNULATED DRILL ×3 IMPLANT
BANDAGE ACE 4X5 VEL STRL LF (GAUZE/BANDAGES/DRESSINGS) ×3 IMPLANT
BLADE 4.2CUDA (BLADE) IMPLANT
BLADE CUDA GRT WHITE 3.5 (BLADE) ×3 IMPLANT
BLADE CUDA SHAVER 3.5 (BLADE) IMPLANT
BLADE CUTTER GATOR 3.5 (BLADE) IMPLANT
BLADE GREAT WHITE 4.2 (BLADE) IMPLANT
BLADE MINI RND TIP GREEN BEAV (BLADE) IMPLANT
BLADE SURG 15 STRL LF DISP TIS (BLADE) ×2 IMPLANT
BLADE SURG 15 STRL SS (BLADE) ×1
BNDG ESMARK 4X9 LF (GAUZE/BANDAGES/DRESSINGS) IMPLANT
BUR CUDA 2.9 (BURR) IMPLANT
BUR FULL RADIUS 2.9 (BURR) IMPLANT
BUR OVAL 4.0 (BURR) IMPLANT
BUR OVAL 6.0 (BURR) ×3 IMPLANT
CUFF TOURNIQUET SINGLE 34IN LL (TOURNIQUET CUFF) ×3 IMPLANT
CUTTER MENISCUS  4.2MM (BLADE)
CUTTER MENISCUS 4.2MM (BLADE) IMPLANT
DECANTER SPIKE VIAL GLASS SM (MISCELLANEOUS) IMPLANT
DRAPE ARTHROSCOPY W/POUCH 90 (DRAPES) ×3 IMPLANT
DRAPE OEC MINIVIEW 54X84 (DRAPES) ×3 IMPLANT
DRAPE SURG 17X23 STRL (DRAPES) IMPLANT
DURAPREP 26ML APPLICATOR (WOUND CARE) ×3 IMPLANT
ELECT MENISCUS 165MM 90D (ELECTRODE) IMPLANT
ELECT REM PT RETURN 9FT ADLT (ELECTROSURGICAL) ×3
ELECTRODE REM PT RTRN 9FT ADLT (ELECTROSURGICAL) ×2 IMPLANT
GAUZE SPONGE 4X4 12PLY STRL (GAUZE/BANDAGES/DRESSINGS) ×3 IMPLANT
GAUZE XEROFORM 1X8 LF (GAUZE/BANDAGES/DRESSINGS) IMPLANT
GLOVE BIOGEL PI IND STRL 7.0 (GLOVE) ×6 IMPLANT
GLOVE BIOGEL PI INDICATOR 7.0 (GLOVE) ×3
GLOVE ECLIPSE 6.5 STRL STRAW (GLOVE) ×3 IMPLANT
GLOVE ECLIPSE 7.0 STRL STRAW (GLOVE) ×3 IMPLANT
GLOVE SURG ORTHO 8.0 STRL STRW (GLOVE) ×3 IMPLANT
GOWN STRL REUS W/ TWL LRG LVL3 (GOWN DISPOSABLE) ×4 IMPLANT
GOWN STRL REUS W/ TWL XL LVL3 (GOWN DISPOSABLE) ×2 IMPLANT
GOWN STRL REUS W/TWL LRG LVL3 (GOWN DISPOSABLE) ×2
GOWN STRL REUS W/TWL XL LVL3 (GOWN DISPOSABLE) ×1
GUIDE PIN 3.2MM 5PK (PIN) ×6
IV NS IRRIG 3000ML ARTHROMATIC (IV SOLUTION) ×6 IMPLANT
MANIFOLD NEPTUNE II (INSTRUMENTS) IMPLANT
NDL SUT 6 .5 CRC .975X.05 MAYO (NEEDLE) IMPLANT
NEEDLE KEITH (NEEDLE) IMPLANT
NEEDLE MAYO TAPER (NEEDLE)
PACK ARTHROSCOPY DSU (CUSTOM PROCEDURE TRAY) ×3 IMPLANT
PACK BASIN DAY SURGERY FS (CUSTOM PROCEDURE TRAY) ×3 IMPLANT
PASSER SUT SWANSON 36MM LOOP (INSTRUMENTS) IMPLANT
PENCIL BUTTON HOLSTER BLD 10FT (ELECTRODE) IMPLANT
PIN GUIDE 3.2MM 5PK (PIN) ×4 IMPLANT
SCREW CANNULATED 6.5X16X50 (Screw) ×3 IMPLANT
SCREW CANNULATED 6.5X32X70 (Screw) ×3 IMPLANT
SET ARTHROSCOPY TUBING (MISCELLANEOUS) ×1
SET ARTHROSCOPY TUBING LN (MISCELLANEOUS) ×2 IMPLANT
SLEEVE SCD COMPRESS KNEE MED (MISCELLANEOUS) ×3 IMPLANT
SPONGE LAP 4X18 X RAY DECT (DISPOSABLE) ×3 IMPLANT
STOCKINETTE 4X48 STRL (DRAPES) IMPLANT
STOCKINETTE 6  STRL (DRAPES)
STOCKINETTE 6 STRL (DRAPES) IMPLANT
STRAP ANKLE FOOT DISTRACTOR (ORTHOPEDIC SUPPLIES) IMPLANT
SUCTION FRAZIER HANDLE 10FR (MISCELLANEOUS)
SUCTION TUBE FRAZIER 10FR DISP (MISCELLANEOUS) IMPLANT
SUT 2 FIBERLOOP 20 STRT BLUE (SUTURE)
SUT ETHILON 3 0 PS 1 (SUTURE) ×3 IMPLANT
SUT FIBERWIRE #2 38 T-5 BLUE (SUTURE)
SUT MNCRL AB 4-0 PS2 18 (SUTURE) IMPLANT
SUT VIC AB 0 CT1 27 (SUTURE)
SUT VIC AB 0 CT1 27XBRD ANBCTR (SUTURE) IMPLANT
SUT VIC AB 2-0 SH 18 (SUTURE) ×3 IMPLANT
SUT VIC AB 3-0 SH 27 (SUTURE)
SUT VIC AB 3-0 SH 27X BRD (SUTURE) IMPLANT
SUTURE 2 FIBERLOOP 20 STRT BLU (SUTURE) IMPLANT
SUTURE FIBERWR #2 38 T-5 BLUE (SUTURE) IMPLANT
TUBE CONNECTING 20X1/4 (TUBING) IMPLANT
WATER STERILE IRR 1000ML POUR (IV SOLUTION) ×3 IMPLANT
YANKAUER SUCT BULB TIP NO VENT (SUCTIONS) IMPLANT

## 2015-08-26 NOTE — Anesthesia Postprocedure Evaluation (Signed)
Anesthesia Post Note  Patient: Matthew Sullivan  Procedure(s) Performed: Procedure(s) (LRB): LEFT ANKLE ARTHROSCOPY TIBIOTALAR ARTHRODESIS (Left)  Patient location during evaluation: PACU Anesthesia Type: General and Regional Level of consciousness: awake and alert Pain management: pain level controlled Vital Signs Assessment: post-procedure vital signs reviewed and stable Respiratory status: spontaneous breathing, nonlabored ventilation, respiratory function stable and patient connected to nasal cannula oxygen Cardiovascular status: blood pressure returned to baseline and stable Postop Assessment: no signs of nausea or vomiting Anesthetic complications: no    Last Vitals:  Filed Vitals:   08/26/15 1345 08/26/15 1400  BP: 135/75 135/75  Pulse: 62 63  Temp:    Resp: 12 12    Last Pain:  Filed Vitals:   08/26/15 1407  PainSc: 3                  Montez Hageman

## 2015-08-26 NOTE — Discharge Instructions (Signed)
Non-weight bearing.  Do not remove splint.  Do not get splint wet.  Follow up appointment in one week.   SYMPTOMS TO REPORT TO YOUR DOCTOR Extreme pain. Extreme swelling. Temperature above 101 degrees. Change in the feeling, color or movement in your toes. Redness, heat or swelling at your puncture sites.  Regional Anesthesia Blocks  1. Numbness or the inability to move the "blocked" extremity may last from 3-48 hours after placement. The length of time depends on the medication injected and your individual response to the medication. If the numbness is not going away after 48 hours, call your surgeon.  2. The extremity that is blocked will need to be protected until the numbness is gone and the  Strength has returned. Because you cannot feel it, you will need to take extra care to avoid injury. Because it may be weak, you may have difficulty moving it or using it. You may not know what position it is in without looking at it while the block is in effect.  3. For blocks in the legs and feet, returning to weight bearing and walking needs to be done carefully. You will need to wait until the numbness is entirely gone and the strength has returned. You should be able to move your leg and foot normally before you try and bear weight or walk. You will need someone to be with you when you first try to ensure you do not fall and possibly risk injury.  4. Bruising and tenderness at the needle site are common side effects and will resolve in a few days.  5. Persistent numbness or new problems with movement should be communicated to the surgeon or the [place] [phone] [place] [phone]).   Post Anesthesia Home Care Instructions  Activity: Get plenty of rest for the remainder of the day. A responsible adult should stay with you for 24 hours following the procedure.  For the next 24 hours, DO NOT: -Drive a car -Paediatric nurse -Drink alcoholic  beverages -Take any medication unless instructed by your physician -Make any legal decisions or sign important papers.  Meals: Start with liquid foods such as gelatin or soup. Progress to regular foods as tolerated. Avoid greasy, spicy, heavy foods. If nausea and/or vomiting occur, drink only clear liquids until the nausea and/or vomiting subsides. Call your physician if vomiting continues.  Special Instructions/Symptoms: Your throat may feel dry or sore from the anesthesia or the breathing tube placed in your throat during surgery. If this causes discomfort, gargle with warm salt water. The discomfort should disappear within 24 hours.  If you had a scopolamine patch placed behind your ear for the management of post- operative nausea and/or vomiting:  1. The medication in the patch is effective for 72 hours, after which it should be removed.  Wrap patch in a tissue and discard in the trash. Wash hands thoroughly with soap and water. 2. You may remove the patch earlier than 72 hours if you experience unpleasant side effects which may include dry mouth, dizziness or visual disturbances. 3. Avoid touching the patch. Wash your hands with soap and water after contact with the patch.

## 2015-08-26 NOTE — Progress Notes (Signed)
Assisted Dr. Carignan with left, ultrasound guided, popliteal block. Side rails up, monitors on throughout procedure. See vital signs in flow sheet. Tolerated Procedure well. 

## 2015-08-26 NOTE — Transfer of Care (Signed)
Immediate Anesthesia Transfer of Care Note  Patient: Matthew Sullivan  Procedure(s) Performed: Procedure(s): LEFT ANKLE ARTHROSCOPY TIBIOTALAR ARTHRODESIS (Left)  Patient Location: PACU  Anesthesia Type:GA combined with regional for post-op pain  Level of Consciousness: awake and patient cooperative  Airway & Oxygen Therapy: Patient Spontanous Breathing and Patient connected to face mask oxygen  Post-op Assessment: Report given to RN and Post -op Vital signs reviewed and stable  Post vital signs: Reviewed and stable  Last Vitals:  Filed Vitals:   08/26/15 1120 08/26/15 1315  BP:  124/73  Pulse: 59 73  Resp: 12 12    Complications: No apparent anesthesia complications

## 2015-08-26 NOTE — Anesthesia Procedure Notes (Addendum)
Anesthesia Regional Block:  Popliteal block  Pre-Anesthetic Checklist: ,, timeout performed, Correct Patient, Correct Site, Correct Laterality, Correct Procedure, Correct Position, site marked, Risks and benefits discussed,  Surgical consent,  Pre-op evaluation,  At surgeon's request and post-op pain management  Laterality: Left and Lower  Prep: Dura Prep       Needles:  Injection technique: Single-shot  Needle Type: Echogenic Stimulator Needle     Needle Length: 9cm 9 cm Needle Gauge: 21 and 21 G    Additional Needles:  Procedures: ultrasound guided (picture in chart) and nerve stimulator Popliteal block Narrative:  Injection made incrementally with aspirations every 5 mL.  Performed by: Personally   Additional Notes: Risks, benefits and alternative to block explained extensively.  Patient tolerated procedure well, without complications.   Procedure Name: LMA Insertion Date/Time: 08/26/2015 11:49 AM Performed by: Lieutenant Diego Pre-anesthesia Checklist: Patient identified, Emergency Drugs available, Suction available and Patient being monitored Patient Re-evaluated:Patient Re-evaluated prior to inductionOxygen Delivery Method: Circle System Utilized Preoxygenation: Pre-oxygenation with 100% oxygen Intubation Type: IV induction Ventilation: Mask ventilation without difficulty LMA: LMA inserted LMA Size: 5.0 Number of attempts: 1 Airway Equipment and Method: Bite block Placement Confirmation: positive ETCO2 and breath sounds checked- equal and bilateral Tube secured with: Tape Dental Injury: Teeth and Oropharynx as per pre-operative assessment

## 2015-08-26 NOTE — Anesthesia Preprocedure Evaluation (Addendum)
Anesthesia Evaluation  Patient identified by MRN, date of birth, ID band Patient awake    Reviewed: Allergy & Precautions, NPO status , Patient's Chart, lab work & pertinent test results  Airway Mallampati: II  TM Distance: >3 FB Neck ROM: Full    Dental no notable dental hx.    Pulmonary neg pulmonary ROS, former smoker,    Pulmonary exam normal breath sounds clear to auscultation       Cardiovascular hypertension, Pt. on medications + Past MI  Normal cardiovascular exam+ dysrhythmias Atrial Fibrillation  Rhythm:Regular Rate:Normal     Neuro/Psych negative neurological ROS  negative psych ROS   GI/Hepatic negative GI ROS, Neg liver ROS,   Endo/Other  negative endocrine ROS  Renal/GU negative Renal ROS  negative genitourinary   Musculoskeletal negative musculoskeletal ROS (+)   Abdominal   Peds negative pediatric ROS (+)  Hematology negative hematology ROS (+)   Anesthesia Other Findings   Reproductive/Obstetrics negative OB ROS                            Anesthesia Physical Anesthesia Plan  ASA: III  Anesthesia Plan: General   Post-op Pain Management: GA combined w/ Regional for post-op pain   Induction: Intravenous  Airway Management Planned: LMA  Additional Equipment:   Intra-op Plan:   Post-operative Plan: Extubation in OR  Informed Consent: I have reviewed the patients History and Physical, chart, labs and discussed the procedure including the risks, benefits and alternatives for the proposed anesthesia with the patient or authorized representative who has indicated his/her understanding and acceptance.   Dental advisory given  Plan Discussed with: CRNA  Anesthesia Plan Comments:         Anesthesia Quick Evaluation

## 2015-08-26 NOTE — Interval H&P Note (Signed)
History and Physical Interval Note:  08/26/2015 7:34 AM  Matthew Sullivan  has presented today for surgery, with the diagnosis of PRIMARY GENRALIZED OASEOARTHRITIS  The various methods of treatment have been discussed with the patient and family. After consideration of risks, benefits and other options for treatment, the patient has consented to  Procedure(s): LEFT ANKLE ARTHROSCOPY TIBIOTALAR ARTHRODESIS (Left) as a surgical intervention .  The patient's history has been reviewed, patient examined, no change in status, stable for surgery.  I have reviewed the patient's chart and labs.  Questions were answered to the patient's satisfaction.     [name]

## 2015-08-27 ENCOUNTER — Encounter (HOSPITAL_BASED_OUTPATIENT_CLINIC_OR_DEPARTMENT_OTHER): Payer: Self-pay | Admitting: Orthopedic Surgery

## 2015-08-27 NOTE — Op Note (Signed)
NAME:  Matthew Sullivan, Matthew Sullivan NO.:  [id number]  MEDICAL RECORD NO.:  [id number]  LOCATION:                                 FACILITY:  PHYSICIAN:  [name], M.D. DATE OF BIRTH:  [birth year]  DATE OF PROCEDURE:  08/26/2015 DATE OF DISCHARGE:                              OPERATIVE REPORT   PREOPERATIVE DIAGNOSIS:  Left ankle end-stage arthritis, primary localized.  POSTOPERATIVE DIAGNOSIS:  Left ankle end-stage arthritis, primary localized.  PROCEDURE:  Left ankle arthroscopy extensive debridement and arthroscopically assisted tibiotalar fusion utilizing 2 cannulated 6.5 lag screws stainless steel.  SURGEON:  [name], MD  ASSISTANT:  Elmyra Ricks, PA, present throughout the entire case and necessary for timely completion of procedure.  ANESTHESIA:  General.  BLOOD LOSS:  Minimal.  SPECIMENS:  None.  CULTURES:  None.  COMPLICATIONS:  None.  DRESSINGS:  Soft compressive.  TOURNIQUET TIME:  1 hour.  PROCEDURE IN DETAIL:  Patient was brought to operating room, placed on the operating table in supine position.  After adequate anesthesia had been obtained, tourniquet applied.  Leg holder applied, prepped and draped in usual sterile fashion.  Exsanguinated with elevation of [place].  Tourniquet inflated to 350 mmHg.  Anterolateral and anteromedial portals.  Arthroscope introduced.  Ankle distended and inspected.  Grade 4 change throughout.  I removed and left the spurs on the front of the tibia to get into the ankle.  All debris cleared throughout.  The cortical bone broken down to good cancellous bleeding bone on both sides.  Cleared out both gutters.  With fluoroscopic guidance, I then reduced the ankle to anatomic plantigrade position.  It was then fixed with a medial screw from the tibia across the talus into the midportion avoiding penetrations to subtalar joint.  A lateral screw from behind the fibula across the tibiotalar joint and  then down anteriorly.  After confirming good position with K-wires, these were countersunk, pre-drilled, measured, and then both screws were put in place.  A 50 mm lag screw medial and a 70 mm lag screw laterally.  These were firmly tightened down.  This gave solid stable fixation.  Grade alignment and fixation confirmed visually and fluoroscopically.  Wounds irrigated, closed with nylon.  Sterile compressive dressing applied.  Well-padded short-leg splint.  Tourniquet deflated and removed.  Anesthesia reversed.  Brought to the recovery room.  Tolerated the surgery well. No complications.     [name], M.D.     DFM/MEDQ  D:  08/26/2015  T:  08/26/2015  Job:  [id number]

## 2015-09-03 DIAGNOSIS — M19072 Primary osteoarthritis, left ankle and foot: Secondary | ICD-10-CM | POA: Diagnosis not present

## 2015-09-06 ENCOUNTER — Ambulatory Visit (INDEPENDENT_AMBULATORY_CARE_PROVIDER_SITE_OTHER): Payer: Medicare Other | Admitting: Cardiology

## 2015-09-06 ENCOUNTER — Encounter: Payer: Self-pay | Admitting: Cardiology

## 2015-09-06 VITALS — BP 140/70 | HR 72 | Ht 72.0 in | Wt 214.0 lb

## 2015-09-06 DIAGNOSIS — I10 Essential (primary) hypertension: Secondary | ICD-10-CM | POA: Diagnosis not present

## 2015-09-06 DIAGNOSIS — I480 Paroxysmal atrial fibrillation: Secondary | ICD-10-CM | POA: Diagnosis not present

## 2015-09-06 DIAGNOSIS — D151 Benign neoplasm of heart: Secondary | ICD-10-CM | POA: Diagnosis not present

## 2015-09-06 DIAGNOSIS — E7800 Pure hypercholesterolemia, unspecified: Secondary | ICD-10-CM | POA: Diagnosis not present

## 2015-09-06 NOTE — Patient Instructions (Signed)
Medication Instructions:  Your physician recommends that you continue on your current medications as directed. Please refer to the Current Medication list given to you today.  Labwork: NONE  Testing/Procedures: NONE  Follow-Up: Your physician wants you to follow-up in: [place] will receive a reminder letter in the mail two months in advance. If you don't receive a letter, please call our office to schedule the follow-up appointment.  If you need a refill on your cardiac medications before your next appointment, please call your pharmacy.

## 2015-09-06 NOTE — Progress Notes (Signed)
Cardiology Office Note   Date:  09/06/2015   ID:  Matthew Sullivan 1946-04-08, MRN [id number]  PCP:  Warren Danes, MD  Cardiologist: [name] MD  Chief Complaint  Patient presents with  . scheduled follow up    benign hypertensive disease without heart failure      History of Present Illness:  Matthew Sullivan is a 70 y.o. male who presents for follow-up scheduled office visit.  The patient has a past history of left atrial myxoma removed by Dr. Roxan Hockey on 04/09/13 The patient does not have any coronary disease by cardiac catheterization . The patient did have problems with postoperative atrial fibrillation. He underwent elective cardioversion on 06/25/13 . Since then he has remained in normal sinus rhythm on tapering doses of amiodarone. We subsequently stopped his amiodarone altogether. He has not been aware of any recurrent atrial fibrillation. He denies chest pain or shortness of breath. 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  However, he had recent left ankle surgery and so he is out of work for a while.  Dr. Percell Miller as his orthopedist.  He has a history of hyperlipidemia. His most recent lipids were on 04/09/15 which showed a cholesterol of 124 and an LDL of 67 and triglycerides of 73.  He is tolerating his statin therapy without side effects. Past Medical History  Diagnosis Date  . Hypertension   . Hyperlipidemia   . History of tinnitus   . Cervical osteoarthritis   . MI, old 83  . History of embolic stroke   . Melanoma ([place])   . Atrial myxoma   . Stroke [facility])     "TIA 10 years ago"    Past Surgical History  Procedure Laterality Date  . Back surgery    . Appendectomy    . Nose surgery    . Nasal septum surgery    . Excision of atrial myxoma Left 04/09/2013    Procedure: EXCISION OF ATRIAL MYXOMA;  Surgeon: [name], MD;  Location: [place];  Service: Open Heart Surgery;  Laterality: Left;  . Intraoperative  transesophageal echocardiogram N/A 04/09/2013    Procedure: INTRAOPERATIVE TRANSESOPHAGEAL ECHOCARDIOGRAM;  Surgeon: [name], MD;  Location: [place];  Service: Open Heart Surgery;  Laterality: N/A;  . Rotator cuff repair      both shoulders  . Cardioversion N/A 06/25/2013    Procedure: CARDIOVERSION;  Surgeon: [name], MD;  Location: [facility] ENDOSCOPY;  Service: Cardiovascular;  Laterality: N/A;  . Left heart catheterization with coronary angiogram N/A 04/07/2013    Procedure: LEFT HEART CATHETERIZATION WITH CORONARY ANGIOGRAM;  Surgeon: [name], MD;  Location: [facility] CATH LAB;  Service: Cardiovascular;  Laterality: N/A;  . Ankle arthroscopy with arthrodesis Left 08/26/2015    Procedure: LEFT ANKLE ARTHROSCOPY TIBIOTALAR ARTHRODESIS;  Surgeon: [name], MD;  Location: [place];  Service: Orthopedics;  Laterality: Left;     Current Outpatient Prescriptions  Medication Sig Dispense Refill  . aspirin 81 MG tablet Take 81 mg by mouth daily.    Marland Kitchen atorvastatin (LIPITOR) 80 MG tablet Take 40 mg by mouth daily.    . metoprolol (LOPRESSOR) 50 MG tablet 1/2 tablet by mouth daily 45 tablet 3   No current facility-administered medications for this visit.    Allergies:   Review of patient's allergies indicates no known allergies.    Social History:  The patient  reports that he quit smoking about 38 years  ago. He does not have any smokeless tobacco history on file. He reports that he does not drink alcohol or use illicit drugs.   Family History:  The patient's family history includes Cancer in his father.    ROS:  Please see the history of present illness.   Otherwise, review of systems are positive for none.   All other systems are reviewed and negative.    PHYSICAL EXAM: VS:  BP 140/70 mmHg  Pulse 72  Ht 6' (1.829 m)  Wt 214 lb (97.07 kg)  BMI 29.02 kg/m2 , BMI Body mass index is 29.02 kg/(m^2). GEN: Well nourished, well developed, in no acute  distress HEENT: normal Neck: no JVD, carotid bruits, or masses Cardiac: RRR; no murmurs, rubs, or gallops,no edema  Respiratory:  clear to auscultation bilaterally, normal work of breathing GI: soft, nontender, nondistended, + BS MS: no deformity or atrophy Skin: warm and dry, no rash Neuro:  Strength and sensation are intact Psych: euthymic mood, full affect   EKG:  EKG is not ordered today. The ekg ordered today demonstrates    Recent Labs: 04/09/2015: ALT 29; BUN 12; Creatinine, Ser 0.88; Potassium 4.3; Sodium 141    Lipid Panel    Component Value Date/Time   CHOL 124 04/09/2015 0919   TRIG 73.0 04/09/2015 0919   HDL 42.70 04/09/2015 0919   CHOLHDL 3 04/09/2015 0919   VLDL 14.6 04/09/2015 0919   LDLCALC 67 04/09/2015 0919      Wt Readings from Last 3 Encounters:  09/06/15 214 lb (97.07 kg)  08/26/15 217 lb (98.431 kg)  04/09/15 218 lb 12.8 oz (99.247 kg)        ASSESSMENT AND PLAN:  1. history of left atrial myxoma successfully excised on 04/09/13 2.  Postoperative paroxysmal atrial fibrillation resolved. No recurrence off amiodarone 3. hypertensive heart disease without heart failure.  4. Hypercholesterolemia 5. Osteoarthritis of the left ankle with recent surgery by Dr. Percell Miller.  Patient currently is wearing a walking boot   Current medicines are reviewed at length with the patient today.  The patient does not have concerns regarding medicines.  The following changes have been made:  no change  Labs/ tests ordered today include:  No orders of the defined types were placed in this encounter.     Disposition:  The patient will continue current medication.  Following my retirement he will see Dr. Jeneen Montgomery in 6 months  Signed, [name] MD 09/06/2015 5:20 PM    [name], [place], [place]  [address] Phone: [phone]; Fax: [phone]

## 2015-09-10 DIAGNOSIS — M19072 Primary osteoarthritis, left ankle and foot: Secondary | ICD-10-CM | POA: Diagnosis not present

## 2015-09-28 DIAGNOSIS — Z85820 Personal history of malignant melanoma of skin: Secondary | ICD-10-CM | POA: Diagnosis not present

## 2015-09-28 DIAGNOSIS — L570 Actinic keratosis: Secondary | ICD-10-CM | POA: Diagnosis not present

## 2015-09-28 DIAGNOSIS — L720 Epidermal cyst: Secondary | ICD-10-CM | POA: Diagnosis not present

## 2015-09-28 DIAGNOSIS — L820 Inflamed seborrheic keratosis: Secondary | ICD-10-CM | POA: Diagnosis not present

## 2015-09-28 DIAGNOSIS — L812 Freckles: Secondary | ICD-10-CM | POA: Diagnosis not present

## 2015-09-28 DIAGNOSIS — L821 Other seborrheic keratosis: Secondary | ICD-10-CM | POA: Diagnosis not present

## 2015-10-15 DIAGNOSIS — M19072 Primary osteoarthritis, left ankle and foot: Secondary | ICD-10-CM | POA: Diagnosis not present

## 2015-10-27 DIAGNOSIS — M19072 Primary osteoarthritis, left ankle and foot: Secondary | ICD-10-CM | POA: Diagnosis not present

## 2015-11-26 DIAGNOSIS — M19072 Primary osteoarthritis, left ankle and foot: Secondary | ICD-10-CM | POA: Diagnosis not present

## 2015-12-04 ENCOUNTER — Emergency Department (HOSPITAL_COMMUNITY): Payer: Medicare Other

## 2015-12-04 ENCOUNTER — Encounter (HOSPITAL_COMMUNITY): Payer: Self-pay | Admitting: *Deleted

## 2015-12-04 ENCOUNTER — Emergency Department (HOSPITAL_COMMUNITY)
Admission: EM | Admit: 2015-12-04 | Discharge: 2015-12-04 | Disposition: A | Discharge status: Home / Self Care | Payer: Medicare Other | Attending: Emergency Medicine | Admitting: Emergency Medicine

## 2015-12-04 DIAGNOSIS — Z79899 Other long term (current) drug therapy: Secondary | ICD-10-CM | POA: Insufficient documentation

## 2015-12-04 DIAGNOSIS — Z85820 Personal history of malignant melanoma of skin: Secondary | ICD-10-CM | POA: Diagnosis not present

## 2015-12-04 DIAGNOSIS — R0602 Shortness of breath: Secondary | ICD-10-CM | POA: Diagnosis not present

## 2015-12-04 DIAGNOSIS — Z87891 Personal history of nicotine dependence: Secondary | ICD-10-CM | POA: Diagnosis not present

## 2015-12-04 DIAGNOSIS — Z8673 Personal history of transient ischemic attack (TIA), and cerebral infarction without residual deficits: Secondary | ICD-10-CM | POA: Insufficient documentation

## 2015-12-04 DIAGNOSIS — I480 Paroxysmal atrial fibrillation: Secondary | ICD-10-CM | POA: Diagnosis not present

## 2015-12-04 DIAGNOSIS — Z7982 Long term (current) use of aspirin: Secondary | ICD-10-CM | POA: Diagnosis not present

## 2015-12-04 DIAGNOSIS — R1013 Epigastric pain: Secondary | ICD-10-CM | POA: Diagnosis not present

## 2015-12-04 DIAGNOSIS — I252 Old myocardial infarction: Secondary | ICD-10-CM | POA: Diagnosis not present

## 2015-12-04 DIAGNOSIS — E785 Hyperlipidemia, unspecified: Secondary | ICD-10-CM | POA: Diagnosis not present

## 2015-12-04 DIAGNOSIS — I10 Essential (primary) hypertension: Secondary | ICD-10-CM | POA: Insufficient documentation

## 2015-12-04 DIAGNOSIS — R002 Palpitations: Secondary | ICD-10-CM | POA: Diagnosis present

## 2015-12-04 HISTORY — DX: Personal history of transient ischemic attack (TIA), and cerebral infarction without residual deficits: Z86.73

## 2015-12-04 LAB — TROPONIN I: Troponin I: <0.03 ng/mL (ref <0.031)

## 2015-12-04 LAB — BASIC METABOLIC PANEL
Anion gap: 7 (ref 5–15)
BUN: 12 mg/dL (ref 6–20)
CHLORIDE: 104 mmol/L (ref 101–111)
CO2: 26 mmol/L (ref 22–32)
Calcium: 9.1 mg/dL (ref 8.9–10.3)
Creatinine, Ser: 0.82 mg/dL (ref 0.61–1.24)
GFR calc Af Amer: >60 mL/min (ref >60)
GFR calc non Af Amer: >60 mL/min (ref >60)
GLUCOSE: 110 mg/dL — AB (ref 65–99)
POTASSIUM: 3.7 mmol/L (ref 3.5–5.1)
Sodium: 137 mmol/L (ref 135–145)

## 2015-12-04 LAB — CBC
HEMATOCRIT: 41.1 % (ref 39.0–52.0)
Hemoglobin: 13.8 g/dL (ref 13.0–17.0)
MCH: 29.7 pg (ref 26.0–34.0)
MCHC: 33.6 g/dL (ref 30.0–36.0)
MCV: 88.4 fL (ref 78.0–100.0)
Platelets: 168 10*3/uL (ref 150–400)
RBC: 4.65 MIL/uL (ref 4.22–5.81)
RDW: 13.1 % (ref 11.5–15.5)
WBC: 5.6 10*3/uL (ref 4.0–10.5)

## 2015-12-04 LAB — BRAIN NATRIURETIC PEPTIDE: B Natriuretic Peptide: 263.5 pg/mL — ABNORMAL HIGH (ref 0.0–100.0)

## 2015-12-04 LAB — PROTIME-INR
INR: 1.07 (ref 0.00–1.49)
Prothrombin Time: 14.1 seconds (ref 11.6–15.2)

## 2015-12-04 MED ORDER — RIVAROXABAN 20 MG PO TABS
20.0000 mg | ORAL_TABLET | Freq: Every day | ORAL | Status: DC
Start: 2015-12-04 — End: 2016-05-30

## 2015-12-04 MED ORDER — RIVAROXABAN 20 MG PO TABS
20.0000 mg | ORAL_TABLET | Freq: Once | ORAL | Status: AC
  Administered 2015-12-04: 20 mg via ORAL
  Filled 2015-12-04: qty 1

## 2015-12-04 MED ORDER — ETOMIDATE 2 MG/ML IV SOLN
INTRAVENOUS | Status: AC | PRN
  Administered 2015-12-04: 10 mg via INTRAVENOUS

## 2015-12-04 MED ORDER — DILTIAZEM HCL 100 MG IV SOLR
5.0000 mg/h | INTRAVENOUS | Status: DC
  Administered 2015-12-04: 5 mg/h via INTRAVENOUS
  Filled 2015-12-04: qty 100

## 2015-12-04 MED ORDER — DILTIAZEM LOAD VIA INFUSION
10.0000 mg | Freq: Once | INTRAVENOUS | Status: AC
  Administered 2015-12-04: 10 mg via INTRAVENOUS
  Filled 2015-12-04: qty 10

## 2015-12-04 MED ORDER — ETOMIDATE 2 MG/ML IV SOLN
10.0000 mg | Freq: Once | INTRAVENOUS | Status: DC
  Filled 2015-12-04: qty 10

## 2015-12-04 NOTE — ED Provider Notes (Signed)
CSN: [id number]     Arrival date & time 12/04/15  1240 History   First MD Initiated Contact with Patient 12/04/15 1302     No chief complaint on file.    (Consider location/radiation/quality/duration/timing/severity/associated sxs/prior Treatment) HPI Comments: Patient reports acute onset of palpitations with lightheadedness and clammy feeling while he was working in the yard this morning around 12:15. He also had some epigastric pain that lasted about 10 minutes and has since resolved. Patient thinks he is in atrial fibrillation which is confirmed. He states he has a history of atrial fibrillation but has not had problems in several years. He had an atrial myxoma removed in 2014. He denies any history of coronary disease. Negative catheterization in 2014. He was anticoagulated with xarelto several years ago but has not had this medication for years. Denies any focal weakness, numbness or tingling. Patient states he felt fine prior to 12:15. He had no symptoms when he went to bed last night.  The history is provided by the patient and a relative.    Past Medical History  Diagnosis Date  . Hypertension   . Hyperlipidemia   . History of tinnitus   . Cervical osteoarthritis   . MI, old 42  . Melanoma ([place])   . Atrial myxoma   . History of TIA (transient ischemic attack)    Past Surgical History  Procedure Laterality Date  . Back surgery    . Appendectomy    . Nose surgery    . Nasal septum surgery    . Excision of atrial myxoma Left 04/09/2013    Procedure: EXCISION OF ATRIAL MYXOMA;  Surgeon: [name], MD;  Location: [place];  Service: Open Heart Surgery;  Laterality: Left;  . Intraoperative transesophageal echocardiogram N/A 04/09/2013    Procedure: INTRAOPERATIVE TRANSESOPHAGEAL ECHOCARDIOGRAM;  Surgeon: [name], MD;  Location: [place];  Service: Open Heart Surgery;  Laterality: N/A;  . Rotator cuff repair      both shoulders  . Cardioversion N/A 06/25/2013     Procedure: CARDIOVERSION;  Surgeon: [name], MD;  Location: [facility] ENDOSCOPY;  Service: Cardiovascular;  Laterality: N/A;  . Left heart catheterization with coronary angiogram N/A 04/07/2013    Procedure: LEFT HEART CATHETERIZATION WITH CORONARY ANGIOGRAM;  Surgeon: [name], MD;  Location: [facility] CATH LAB;  Service: Cardiovascular;  Laterality: N/A;  . Ankle arthroscopy with arthrodesis Left 08/26/2015    Procedure: LEFT ANKLE ARTHROSCOPY TIBIOTALAR ARTHRODESIS;  Surgeon: [name], MD;  Location: [place];  Service: Orthopedics;  Laterality: Left;   Family History  Problem Relation Age of Onset  . Cancer Father    Social History  Substance Use Topics  . Smoking status: Former Smoker    Quit date: 07/10/1977  . Smokeless tobacco: None  . Alcohol Use: No    Review of Systems  Constitutional: Negative for fever, activity change and appetite change.  HENT: Negative for congestion and rhinorrhea.   Respiratory: Negative for cough and chest tightness.   Cardiovascular: Positive for palpitations.  Gastrointestinal: Positive for nausea and abdominal pain. Negative for vomiting.  Genitourinary: Negative for urgency and testicular pain.  Musculoskeletal: Negative for myalgias and arthralgias.  Skin: Negative for rash.  Neurological: Negative for dizziness, seizures, weakness and numbness.  A complete 10 system review of systems was obtained and all systems are negative except as noted in the HPI and PMH.      Allergies  Review of patient's allergies indicates no known allergies.  Home Medications   Prior to Admission medications   Medication Sig Start Date End Date Taking? Authorizing Provider  aspirin 81 MG tablet Take 81 mg by mouth daily.    Historical Provider, MD  atorvastatin (LIPITOR) 80 MG tablet Take 40 mg by mouth daily.    Historical Provider, MD  metoprolol (LOPRESSOR) 50 MG tablet 1/2 tablet by mouth daily 07/16/15   [name], MD    BP 120/65 mmHg  Pulse 78  Resp 18  SpO2 99% Physical Exam  Constitutional: He is oriented to person, place, and time. He appears well-developed and well-nourished. No distress.  HENT:  Head: Normocephalic and atraumatic.  Mouth/Throat: Oropharynx is clear and moist. No oropharyngeal exudate.  Eyes: Conjunctivae and EOM are normal. Pupils are equal, round, and reactive to light.  Neck: Normal range of motion. Neck supple.  No meningismus.  Cardiovascular: Normal rate, normal heart sounds and intact distal pulses.   No murmur heard. Irregular rhythm  Pulmonary/Chest: Effort normal and breath sounds normal. No respiratory distress.  Abdominal: Soft. There is no tenderness. There is no rebound and no guarding.  Musculoskeletal: Normal range of motion. He exhibits no edema or tenderness.  Neurological: He is alert and oriented to person, place, and time. No cranial nerve deficit. He exhibits normal muscle tone. Coordination normal.  No ataxia on finger to nose bilaterally. No pronator drift. 5/5 strength throughout. CN 2-12 intact.Equal grip strength. Sensation intact.   Skin: Skin is warm.  Psychiatric: He has a normal mood and affect. His behavior is normal.  Nursing note and vitals reviewed.   ED Course  .Cardioversion Date/Time: 12/04/2015 3:07 PM Performed by: Ezequiel Essex Authorized by: Ezequiel Essex Consent: Verbal consent obtained. Written consent obtained. Risks and benefits: risks, benefits and alternatives were discussed Consent given by: patient Patient understanding: patient states understanding of the procedure being performed Patient consent: the patient's understanding of the procedure matches consent given Procedure consent: procedure consent matches procedure scheduled Relevant documents: relevant documents present and verified Test results: test results available and properly labeled Site marked: the operative site was not marked Patient identity  confirmed: provided demographic data and verbally with patient Time out: Immediately prior to procedure a "time out" was called to verify the correct patient, procedure, equipment, support staff and site/side marked as required. Patient sedated: yes Sedatives: etomidate Sedation start date/time: 12/04/2015 2:55 PM Sedation end date/time: 12/04/2015 3:07 PM Vitals: Vital signs were monitored during sedation. Cardioversion basis: elective Indications: failure of anti-arrhythmic medications Pre-procedure rhythm: atrial fibrillation Patient position: patient was placed in a supine position Chest area: chest area exposed Electrodes: pads Electrodes placed: anterior-posterior Number of attempts: 1 Attempt 1 mode: synchronous Attempt 1 waveform: biphasic Attempt 1 shock (in Joules): 100 Attempt 1 outcome: conversion to normal sinus rhythm Post-procedure rhythm: normal sinus rhythm Complications: no complications Patient tolerance: Patient tolerated the procedure well with no immediate complications   (including critical care time) Labs Review Labs Reviewed  BASIC METABOLIC PANEL - Abnormal; Notable for the following:    Glucose, Bld 110 (*)    All other components within normal limits  BRAIN NATRIURETIC PEPTIDE - Abnormal; Notable for the following:    B Natriuretic Peptide 263.5 (*)    All other components within normal limits  CBC  TROPONIN I  PROTIME-INR    Imaging Review Dg Chest Portable 1 View  12/04/2015  CLINICAL DATA:  Shortness of breath. Hypertension. Prior myocardial infarction. EXAM: PORTABLE CHEST 1 VIEW COMPARISON:  05/06/2013 FINDINGS: Apical  lordotic positioning. Midline trachea. Mild cardiomegaly. Prior median sternotomy. No pleural effusion or pneumothorax. No congestive failure. Clear lungs. IMPRESSION: No acute cardiopulmonary disease. Cardiomegaly without congestive failure. Electronically Signed   By: Abigail Miyamoto M.D.   On: 12/04/2015 13:25   I have personally  reviewed and evaluated these images and lab results as part of my medical decision-making.   EKG Interpretation   Date/Time:  Saturday Dec 04 2015 12:44:24 EDT Ventricular Rate:  142 PR Interval:  148 QRS Duration: 84 QT Interval:  254 QTC Calculation: 390 R Axis:   55 Text Interpretation:  atrial fibrillation with RVR Marked ST abnormality,  possible lateral subendocardial injury Abnormal ECG ST changes likely rate  related Confirmed by Wyvonnia Dusky  MD, Elisabeth Strom [phone]) on 12/04/2015 1:39:13 PM      MDM   Final diagnoses:  Paroxysmal atrial fibrillation ([place])  Acute onset of palpitations, epigastric discomfort, clamminess onset around 12:15. Epigastric pain resolved after about 15 minutes.  Patient found to be in rapid atrial fibrillation with RVR. He is not anticoagulated. He was told by his cardiologist Dr. Mare Ferrari that he had gone long enough without atrial fibrillation that he did not need to be anticoagulated.  He's had no symptoms since his atrial myxoma removal in 2014. Had a clean catheterization in 2014.  Patient is confident that his symptoms onset around 12:15. He felt fine when he went to bed last night. He does not want to be admitted and wants to be cardioverted.  Case discussed with Dr. Domenic Polite of cardiology. He agrees patient appears safe for cardioversion. Patient with history of TIA remotely 10 years ago with no residual deficits. Dr. Domenic Polite feels patient should be back on xarelto if he is willing.  Sedated and cardioverted successfully. NSR on EKG, ST changes have resolved. Troponin negative. No further episodes of palpitations or epigastric pain.  Restart xarelto. followup with cardiology and Afib clinic ASAP. Return precautions discussed.   This patients CHA2DS2-VASc Score and unadjusted Ischemic Stroke Rate (% per year) is equal to 3.2 % stroke rate/year from a score of 3  Above score calculated as 1 point each if present [CHF, HTN, DM,  Vascular=MI/PAD/Aortic Plaque, Age if 65-74, or Male] Above score calculated as 2 points each if present [Age > 75, or Stroke/TIA/TE]       Procedural sedation Performed by: Ezequiel Essex Consent: Verbal consent obtained. Risks and benefits: risks, benefits and alternatives were discussed Required items: required blood products, implants, devices, and special equipment available Patient identity confirmed: arm band and provided demographic data Time out: Immediately prior to procedure a "time out" was called to verify the correct patient, procedure, equipment, support staff and site/side marked as required.  Sedation type: moderate (conscious) sedation NPO time confirmed and considedered  Sedatives: ETOMIDATE  Physician Time at Bedside: 15  Vitals: Vital signs were monitored during sedation. Cardiac Monitor, pulse oximeter Patient tolerance: Patient tolerated the procedure well with no immediate complications. Comments: Pt with uneventful recovered. Returned to pre-procedural sedation baseline   Ezequiel Essex, MD 12/04/15 1810

## 2015-12-04 NOTE — Discharge Instructions (Addendum)
Atrial Fibrillation Call your cardiologist for an appointment on Tuesday. If they cannot see you, you can go to the Afib clinic. Return to the ED if you develop chest pain, shortness of breath, or any other concerns. Atrial fibrillation is a type of irregular or rapid heartbeat (arrhythmia). In atrial fibrillation, the heart quivers continuously in a chaotic pattern. This occurs when parts of the heart receive disorganized signals that make the heart unable to pump blood normally. This can increase the risk for stroke, heart failure, and other heart-related conditions. There are different types of atrial fibrillation, including:  Paroxysmal atrial fibrillation. This type starts suddenly, and it usually stops on its own shortly after it starts.  Persistent atrial fibrillation. This type often lasts longer than a week. It may stop on its own or with treatment.  Long-lasting persistent atrial fibrillation. This type lasts longer than 12 months.  Permanent atrial fibrillation. This type does not go away. Talk with your health care provider to learn about the type of atrial fibrillation that you have. CAUSES This condition is caused by some heart-related conditions or procedures, including:  A heart attack.  Coronary artery disease.  Heart failure.  Heart valve conditions.  High blood pressure.  Inflammation of the sac that surrounds the heart (pericarditis).  Heart surgery.  Certain heart rhythm disorders, such as Wolf-Parkinson-White syndrome. Other causes include:  Pneumonia.  Obstructive sleep apnea.  Blockage of an artery in the lungs (pulmonary embolism, or PE).  Lung cancer.  Chronic lung disease.  Thyroid problems, especially if the thyroid is overactive (hyperthyroidism).  Caffeine.  Excessive alcohol use or illegal drug use.  Use of some medicines, including certain decongestants and diet pills. Sometimes, the cause cannot be found. RISK FACTORS This  condition is more likely to develop in:  People who are older in age.  People who smoke.  People who have diabetes mellitus.  People who are overweight (obese).  Athletes who exercise vigorously. SYMPTOMS Symptoms of this condition include:  A feeling that your heart is beating rapidly or irregularly.  A feeling of discomfort or pain in your chest.  Shortness of breath.  Sudden light-headedness or weakness.  Getting tired easily during exercise. In some cases, there are no symptoms. DIAGNOSIS Your health care provider may be able to detect atrial fibrillation when taking your pulse. If detected, this condition may be diagnosed with:  An electrocardiogram (ECG).  A Holter monitor test that records your heartbeat patterns over a 24-hour period.  Transthoracic echocardiogram (TTE) to evaluate how blood flows through your heart.  Transesophageal echocardiogram (TEE) to view more detailed images of your heart.  A stress test.  Imaging tests, such as a CT scan or chest X-ray.  Blood tests. TREATMENT The main goals of treatment are to prevent blood clots from forming and to keep your heart beating at a normal rate and rhythm. The type of treatment that you receive depends on many factors, such as your underlying medical conditions and how you feel when you are experiencing atrial fibrillation. This condition may be treated with:  Medicine to slow down the heart rate, bring the heart's rhythm back to normal, or prevent clots from forming.  Electrical cardioversion. This is a procedure that resets your heart's rhythm by delivering a controlled, low-energy shock to the heart through your skin.  Different types of ablation, such as catheter ablation, catheter ablation with pacemaker, or surgical ablation. These procedures destroy the heart tissues that send abnormal signals. When the pacemaker  is used, it is placed under your skin to help your heart beat in a regular  rhythm. HOME CARE INSTRUCTIONS  Take over-the counter and prescription medicines only as told by your health care provider.  If your health care provider prescribed a blood-thinning medicine (anticoagulant), take it exactly as told. Taking too much blood-thinning medicine can cause bleeding. If you do not take enough blood-thinning medicine, you will not have the protection that you need against stroke and other problems.  Do not use tobacco products, including cigarettes, chewing tobacco, and e-cigarettes. If you need help quitting, ask your health care provider.  If you have obstructive sleep apnea, manage your condition as told by your health care provider.  Do not drink alcohol.  Do not drink beverages that contain caffeine, such as coffee, soda, and tea.  Maintain a healthy weight. Do not use diet pills unless your health care provider approves. Diet pills may make heart problems worse.  Follow diet instructions as told by your health care provider.  Exercise regularly as told by your health care provider.  Keep all follow-up visits as told by your health care provider. This is important. PREVENTION  Avoid drinking beverages that contain caffeine or alcohol.  Avoid certain medicines, especially medicines that are used for breathing problems.  Avoid certain herbs and herbal medicines, such as those that contain ephedra or ginseng.  Do not use illegal drugs, such as cocaine and amphetamines.  Do not smoke.  Manage your high blood pressure. SEEK MEDICAL CARE IF:  You notice a change in the rate, rhythm, or strength of your heartbeat.  You are taking an anticoagulant and you notice increased bruising.  You tire more easily when you exercise or exert yourself. SEEK IMMEDIATE MEDICAL CARE IF:  You have chest pain, abdominal pain, sweating, or weakness.  You feel nauseous.  You notice blood in your vomit, bowel movement, or urine.  You have shortness of breath.  You  suddenly have swollen feet and ankles.  You feel dizzy.  You have sudden weakness or numbness of the face, arm, or leg, especially on one side of the body.  You have trouble speaking, trouble understanding, or both (aphasia).  Your face or your eyelid droops on one side. These symptoms may represent a serious problem that is an emergency. Do not wait to see if the symptoms will go away. Get medical help right away. Call your local emergency services (911 in the [place] Do not drive yourself to the hospital.   This information is not intended to replace advice given to you by your health care provider. Make sure you discuss any questions you have with your health care provider.   Document Released: 06/26/2005 Document Revised: 03/17/2015 Document Reviewed: 10/21/2014 Elsevier Interactive Patient Education 2016 [place] on my medicine - XARELTO (Rivaroxaban)  Why was Xarelto prescribed for you? Xarelto was prescribed for you to reduce the risk of a blood clot forming that can cause a stroke if you have a medical condition called atrial fibrillation (a type of irregular heartbeat).  What do you need to know about xarelto ? Take your Xarelto ONCE DAILY at the same time every day with your evening meal. If you have difficulty swallowing the tablet whole, you may crush it and mix in applesauce just prior to taking your dose.  Take Xarelto exactly as prescribed by your doctor and DO NOT stop taking Xarelto without talking to the doctor who prescribed the medication.  Stopping without other stroke prevention medication to take the place of Xarelto may increase your risk of developing a clot that causes a stroke.  Refill your prescription before you run out.  After discharge, you should have regular check-up appointments with your healthcare provider that is prescribing your Xarelto.  In the future your dose may need to be changed if your kidney function or weight  changes by a significant amount.  What do you do if you miss a dose? If you are taking Xarelto ONCE DAILY and you miss a dose, take it as soon as you remember on the same day then continue your regularly scheduled once daily regimen the next day. Do not take two doses of Xarelto at the same time or on the same day.   Important Safety Information A possible side effect of Xarelto is bleeding. You should call your healthcare provider right away if you experience any of the following: ? Bleeding from an injury or your nose that does not stop. ? Unusual colored urine (red or dark brown) or unusual colored stools (red or black). ? Unusual bruising for unknown reasons. ? A serious fall or if you hit your head (even if there is no bleeding).  Some medicines may interact with Xarelto and might increase your risk of bleeding while on Xarelto. To help avoid this, consult your healthcare provider or pharmacist prior to using any new prescription or non-prescription medications, including herbals, vitamins, non-steroidal anti-inflammatory drugs (NSAIDs) and supplements.  This website has more information on Xarelto: [email or web address]://guerra-benson.com/.

## 2015-12-04 NOTE — ED Notes (Signed)
States he had a tumor removed from his heart 2 years ago and hasn't had any problems . States he was working in the yard this am has a slight discomfort in epigastric area, and became clammy. States he isn't having any time at this time.

## 2015-12-07 ENCOUNTER — Telehealth: Payer: Self-pay | Admitting: Cardiovascular Disease

## 2015-12-07 MED ORDER — METOPROLOL TARTRATE 25 MG PO TABS: 25.0000 mg | ORAL_TABLET | Freq: Two times a day (BID) | ORAL | Status: DC

## 2015-12-07 NOTE — Telephone Encounter (Signed)
Spoke with patient who states his heart is in rhythm today since DCCV on Saturday.  He states he is feeling well today, just a little tired.  He states he is taking Xarelto and has 10 tablets left.  He states the cost of Xarelto was a problem for him in the past.  I assured him that we will help him with the cost of the medication by providing samples.  I advised him to take his metoprolol (25 mg twice daily) as previously prescribed for better rate control - he states he was taking 25 mg at bedtime only.  I advised that first available appointment with Dr. Acie Fredrickson is the one he has scheduled for June 9.  I advised that I will call him if there is a cancellation prior to that date.  He verbalized understanding and agreement.

## 2015-12-07 NOTE — Telephone Encounter (Signed)
Forwarding to Dr. Acie Fredrickson for his review.  Note from ED visit Saturday 5/27:  Acute onset of palpitations, epigastric discomfort, clamminess onset around 12:15. Epigastric pain resolved after about 15 minutes. Patient found to be in rapid atrial fibrillation with RVR. He is not anticoagulated. He was told by his cardiologist Dr. Mare Ferrari that he had gone long enough without atrial fibrillation that he did not need to be anticoagulated.  He's had no symptoms since his atrial myxoma removal in 2014. Had a clean catheterization in 2014.  Patient is confident that his symptoms onset around 12:15. He felt fine when he went to bed last night. He does not want to be admitted and wants to be cardioverted.  Case discussed with Dr. Domenic Polite of cardiology. He agrees patient appears safe for cardioversion. Patient with history of TIA remotely 10 years ago with no residual deficits. Dr. Domenic Polite feels patient should be back on xarelto if he is willing.  Sedated and cardioverted successfully. NSR on EKG, ST changes have resolved. Troponin negative. No further episodes of palpitations or epigastric pain.  Restart xarelto. followup with cardiology and Afib clinic ASAP. Return precautions discussed.  This patients CHA2DS2-VASc Score and unadjusted Ischemic Stroke Rate (% per year) is equal to 3.2 % stroke rate/year from a score of 3  Above score calculated as 1 point each if present [CHF, HTN, DM, Vascular=MI/PAD/Aortic Plaque, Age if 65-74, or Male] Above score calculated as 2 points each if present [Age > 75, or Stroke/TIA/TE]       Procedural sedation Performed by: Ezequiel Essex Consent: Verbal consent obtained. Risks and benefits: risks, benefits and alternatives were discussed Required items: required blood products, implants, devices, and special equipment available Patient identity confirmed: arm band and provided demographic data Time out: Immediately prior to procedure a "time out" was  called to verify the correct patient, procedure, equipment, support staff and site/side marked as required.  Sedation type: moderate (conscious) sedation NPO time confirmed and considedered  Sedatives: ETOMIDATE  Physician Time at Bedside: 15  Vitals: Vital signs were monitored during sedation. Cardiac Monitor, pulse oximeter Patient tolerance: Patient tolerated the procedure well with no immediate complications. Comments: Pt with uneventful recovered. Returned to pre-procedural sedation baseline   Ezequiel Essex, MD 12/04/15 1810

## 2015-12-07 NOTE — Telephone Encounter (Signed)
Agree with anticoagulation

## 2015-12-07 NOTE — Telephone Encounter (Signed)
New message  Pt was seen in the hospital over the weekend on Saturday. Gave him medicine it didn't work so they had to shock him. Pt was told to come in the office within the next 2 days. Nect OV with [facility], was 12/17/2015 at 8:30a. Request a call back to determine is a sooner is needed. Please call

## 2015-12-07 NOTE — Telephone Encounter (Signed)
Agree with appt. In several weeks.

## 2015-12-07 NOTE — Telephone Encounter (Signed)
New Message  Pt wife called request a call back to discuss the pt being in the hospital recently. Pt went back into afib. He ended up having to be shocked

## 2015-12-15 ENCOUNTER — Ambulatory Visit (INDEPENDENT_AMBULATORY_CARE_PROVIDER_SITE_OTHER): Payer: Medicare Other | Admitting: Cardiovascular Disease

## 2015-12-15 ENCOUNTER — Encounter: Payer: Self-pay | Admitting: Cardiovascular Disease

## 2015-12-15 VITALS — BP 130/72 | HR 60 | Ht 72.0 in | Wt 214.8 lb

## 2015-12-15 DIAGNOSIS — I480 Paroxysmal atrial fibrillation: Secondary | ICD-10-CM | POA: Diagnosis not present

## 2015-12-15 DIAGNOSIS — I10 Essential (primary) hypertension: Secondary | ICD-10-CM

## 2015-12-15 NOTE — Patient Instructions (Signed)

## 2015-12-15 NOTE — Progress Notes (Signed)
Cardiology Office Note   Date:  12/15/2015   ID:  Matthew Sullivan, DOB 1946-03-21, MRN [id number]  PCP:  Warren Danes, MD  Cardiologist: [name] MD  Chief Complaint  Patient presents with  . Follow-up    PAF       History of Present Illness:  Matthew Sullivan is a 70 y.o. male who presents for follow-up scheduled office visit.  The patient has a past history of left atrial myxoma removed by Dr. Roxan Hockey on 04/09/13 The patient does not have any coronary disease by cardiac catheterization . The patient did have problems with postoperative atrial fibrillation. He underwent elective cardioversion on 06/25/13 . Since then he has remained in normal sinus rhythm on tapering doses of amiodarone. We subsequently stopped his amiodarone altogether. He has not been aware of any recurrent atrial fibrillation. He denies chest pain or shortness of breath. 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  However, he had recent left ankle surgery and so he is out of work for a while.  Dr. Percell Miller as his orthopedist.  He has a history of hyperlipidemia. His most recent lipids were on 04/09/15 which showed a cholesterol of 124 and an LDL of 67 and triglycerides of 73.  He is tolerating his statin therapy without side effects.  December 15, 2015: Seen for the first time - transfer from [place] .  Seen for PAF - occurred following excision of an atrial myxoma.  2 weeks ago - he noticed some palpitations He was out working in the heat.   Felt his pulse, went to the ER, was cardioverted in the ER . Is now back on Xarelto.20 mg a day  Continues on metoprolol 25 BID  And atorvastatin 40 mg a day     Past Medical History  Diagnosis Date  . Hypertension   . Hyperlipidemia   . History of tinnitus   . Cervical osteoarthritis   . MI, old 70  . Melanoma ([place])   . Atrial myxoma   . History of TIA (transient ischemic attack)     Past Surgical History  Procedure Laterality  Date  . Back surgery    . Appendectomy    . Nose surgery    . Nasal septum surgery    . Excision of atrial myxoma Left 04/09/2013    Procedure: EXCISION OF ATRIAL MYXOMA;  Surgeon: [name], MD;  Location: [place];  Service: Open Heart Surgery;  Laterality: Left;  . Intraoperative transesophageal echocardiogram N/A 04/09/2013    Procedure: INTRAOPERATIVE TRANSESOPHAGEAL ECHOCARDIOGRAM;  Surgeon: [name], MD;  Location: [place];  Service: Open Heart Surgery;  Laterality: N/A;  . Rotator cuff repair      both shoulders  . Cardioversion N/A 06/25/2013    Procedure: CARDIOVERSION;  Surgeon: [name], MD;  Location: [facility] ENDOSCOPY;  Service: Cardiovascular;  Laterality: N/A;  . Left heart catheterization with coronary angiogram N/A 04/07/2013    Procedure: LEFT HEART CATHETERIZATION WITH CORONARY ANGIOGRAM;  Surgeon: [name], MD;  Location: [facility] CATH LAB;  Service: Cardiovascular;  Laterality: N/A;  . Ankle arthroscopy with arthrodesis Left 08/26/2015    Procedure: LEFT ANKLE ARTHROSCOPY TIBIOTALAR ARTHRODESIS;  Surgeon: [name], MD;  Location: [place];  Service: Orthopedics;  Laterality: Left;     Current Outpatient Prescriptions  Medication Sig Dispense Refill  . aspirin 81 MG tablet Take 81 mg by mouth daily.    Marland Kitchen atorvastatin (LIPITOR)  80 MG tablet Take 40 mg by mouth at bedtime.     . metoprolol (LOPRESSOR) 25 MG tablet Take 1 tablet (25 mg total) by mouth 2 (two) times daily. 60 tablet 11  . rivaroxaban (XARELTO) 20 MG TABS tablet Take 1 tablet (20 mg total) by mouth daily with supper. 30 tablet 0   No current facility-administered medications for this visit.    Allergies:   Review of patient's allergies indicates no known allergies.    Social History:  The patient  reports that he quit smoking about 38 years ago. He does not have any smokeless tobacco history on file. He reports that he does not drink alcohol or use illicit  drugs.   Family History:  The patient's family history includes Cancer in his father.    ROS:  Please see the history of present illness.   Otherwise, review of systems are positive for none.   All other systems are reviewed and negative.    PHYSICAL EXAM: VS:  BP 130/72 mmHg  Pulse 60  Ht 6' (1.829 m)  Wt 214 lb 12.8 oz (97.433 kg)  BMI 29.13 kg/m2 , BMI Body mass index is 29.13 kg/(m^2). GEN: Well nourished, well developed, in no acute distress HEENT: normal Neck: no JVD, carotid bruits, or masses Cardiac: RRR; no murmurs, rubs, or gallops,no edema  Respiratory:  clear to auscultation bilaterally, normal work of breathing GI: soft, nontender, nondistended, + BS MS: no deformity or atrophy Skin: warm and dry, no rash Neuro:  Strength and sensation are intact Psych: euthymic mood, full affect   EKG:  EKG is not ordered today. The ekg ordered today demonstrates    Recent Labs: 04/09/2015: ALT 29 12/04/2015: B Natriuretic Peptide 263.5*; BUN 12; Creatinine, Ser 0.82; Hemoglobin 13.8; Platelets 168; Potassium 3.7; Sodium 137    Lipid Panel    Component Value Date/Time   CHOL 124 04/09/2015 0919   TRIG 73.0 04/09/2015 0919   HDL 42.70 04/09/2015 0919   CHOLHDL 3 04/09/2015 0919   VLDL 14.6 04/09/2015 0919   LDLCALC 67 04/09/2015 0919      Wt Readings from Last 3 Encounters:  12/15/15 214 lb 12.8 oz (97.433 kg)  09/06/15 214 lb (97.07 kg)  08/26/15 217 lb (98.431 kg)        ASSESSMENT AND PLAN:  1. history of left atrial myxoma successfully excised on 04/09/13 2.  Postoperative paroxysmal atrial fibrillation :   CHADS 2VASC = 1.    Had recurrent atrial fib 2 weeks ago .   Was cardioverted in the ER .   Is now on Xarelto but he says that he cant afford it . He asked about alternatives to xarelto - does not want to use coumadin   We've given him 2 weeks of samples. This should give him out to the one month mark. At that point given his CHADS2VASC score of 1, he  could choose to take ASA 81 mg a day .    He'll check the proximal Xarelto with his pharmacist. I'll see him back in 6 month.  3. hypertensive heart disease without heart failure.   4. Hypercholesterolemia - will check fasting lipids at his next appt.   5. Osteoarthritis of the left ankle    Current medicines are reviewed at length with the patient today.  The patient does not have concerns regarding medicines.  The following changes have been made:  no change  Labs/ tests ordered today include:  No orders of the defined  types were placed in this encounter.      Mertie Moores, MD  12/15/2015 2:46 PM    [facility],  [place], [place]  [address] Pager [phone] Phone: [phone]; Fax: [phone]

## 2015-12-17 ENCOUNTER — Ambulatory Visit: Payer: Medicare Other | Admitting: Cardiovascular Disease

## 2016-01-03 ENCOUNTER — Telehealth: Payer: Self-pay | Admitting: Cardiovascular Disease

## 2016-01-03 NOTE — Telephone Encounter (Signed)
called & informed them that samples placed up front, pt expressed understanding.

## 2016-01-03 NOTE — Telephone Encounter (Signed)
New message ° °Patient calling the office for samples of medication: ° ° °1.  What medication and dosage are you requesting samples for? Xarelto 20mg  ° °2.  Are you currently out of this medication? yes ° ° ° °

## 2016-01-04 DIAGNOSIS — M25572 Pain in left ankle and joints of left foot: Secondary | ICD-10-CM | POA: Diagnosis not present

## 2016-01-18 ENCOUNTER — Other Ambulatory Visit: Payer: Self-pay

## 2016-01-18 MED ORDER — ATORVASTATIN CALCIUM 80 MG PO TABS: 40.0000 mg | ORAL_TABLET | Freq: Every day | ORAL | Status: DC

## 2016-02-01 ENCOUNTER — Ambulatory Visit: Payer: Medicare Other | Admitting: Internal Medicine

## 2016-02-01 ENCOUNTER — Encounter: Payer: Self-pay | Admitting: Internal Medicine

## 2016-02-01 ENCOUNTER — Ambulatory Visit (INDEPENDENT_AMBULATORY_CARE_PROVIDER_SITE_OTHER): Payer: Medicare Other | Admitting: Internal Medicine

## 2016-02-01 VITALS — BP 132/70 | HR 61 | Temp 98.1°F | Ht 71.0 in | Wt 206.0 lb

## 2016-02-01 DIAGNOSIS — D171 Benign lipomatous neoplasm of skin and subcutaneous tissue of trunk: Secondary | ICD-10-CM

## 2016-02-01 NOTE — Progress Notes (Signed)
Pre visit review using our clinic review tool, if applicable. No additional management support is needed unless otherwise documented below in the visit note. 

## 2016-02-01 NOTE — Progress Notes (Signed)
Subjective:    Patient ID: Matthew Sullivan, male    DOB: 10/17/1945, 70 y.o.   MRN: [id number]  HPI  Pt presents to the clinic today with c/o a cyst under his left arm. He noticed this 2 months ago. The area has not grown in size. It is not tender, red or warm to touch. He has noticed any drainage from the area. He has not tried anything OTC for this.  Review of Systems      Past Medical History:  Diagnosis Date  . Atrial myxoma   . Cervical osteoarthritis   . History of TIA (transient ischemic attack)   . History of tinnitus   . Hyperlipidemia   . Hypertension   . Melanoma ([place])   . MI, old 38    Current Outpatient Prescriptions  Medication Sig Dispense Refill  . atorvastatin (LIPITOR) 80 MG tablet Take 0.5 tablets (40 mg total) by mouth at bedtime. 45 tablet 3  . metoprolol (LOPRESSOR) 25 MG tablet Take 1 tablet (25 mg total) by mouth 2 (two) times daily. 60 tablet 11  . rivaroxaban (XARELTO) 20 MG TABS tablet Take 1 tablet (20 mg total) by mouth daily with supper. 30 tablet 0   No current facility-administered medications for this visit.     No Known Allergies  Family History  Problem Relation Age of Onset  . Colon cancer Mother   . Liver cancer Mother   . Cancer Father     Social History   Social History  . Marital status: Married    Spouse name: N/A  . Number of children: N/A  . Years of education: N/A   Occupational History  . Not on file.   Social History Main Topics  . Smoking status: Former Smoker    Quit date: 07/10/1977  . Smokeless tobacco: Never Used  . Alcohol use 1.2 oz/week    1 Glasses of wine, 1 Cans of beer per week     Comment: rare  . Drug use: No  . Sexual activity: Not on file   Other Topics Concern  . Not on file   Social History Narrative  . No narrative on file     Constitutional: Denies fever, malaise, fatigue, headache or abrupt weight changes.  Skin: Pt reports a lump under his left armpit. Denies redness, rashes,  lesions or ulcercations.    No other specific complaints in a complete review of systems (except as listed in HPI above).  Objective:   Physical Exam  BP 132/70   Pulse 61   Temp 98.1 F (36.7 C) (Oral)   Ht 5\' 11"  (1.803 m)   Wt 206 lb (93.4 kg)   SpO2 98%   BMI 28.73 kg/m  Wt Readings from Last 3 Encounters:  02/01/16 206 lb (93.4 kg)  12/15/15 214 lb 12.8 oz (97.4 kg)  09/06/15 214 lb (97.1 kg)    General: Appears his stated age, well developed, well nourished in NAD. Skin: Warm, dry and intact. 2.5cm x 2.5 cm lipoma noted left lateral chest wall. Cardiovascular: Normal rate and rhythm. S1,S2 noted.   Pulmonary/Chest: Normal effort and positive vesicular breath sounds. No respiratory distress. No wheezes, rales or ronchi noted.  Neurological: Alert and oriented.  Psychiatric: Mood and affect normal.   BMET    Component Value Date/Time   NA 137 12/04/2015 1252   K 3.7 12/04/2015 1252   CL 104 12/04/2015 1252   CO2 26 12/04/2015 1252   GLUCOSE 110 (H)  12/04/2015 1252   BUN 12 12/04/2015 1252   CREATININE 0.82 12/04/2015 1252   CALCIUM 9.1 12/04/2015 1252   GFRNONAA >60 12/04/2015 1252   GFRAA >60 12/04/2015 1252    Lipid Panel     Component Value Date/Time   CHOL 124 04/09/2015 0919   TRIG 73.0 04/09/2015 0919   HDL 42.70 04/09/2015 0919   CHOLHDL 3 04/09/2015 0919   VLDL 14.6 04/09/2015 0919   LDLCALC 67 04/09/2015 0919    CBC    Component Value Date/Time   WBC 5.6 12/04/2015 1252   RBC 4.65 12/04/2015 1252   HGB 13.8 12/04/2015 1252   HCT 41.1 12/04/2015 1252   PLT 168 12/04/2015 1252   MCV 88.4 12/04/2015 1252   MCH 29.7 12/04/2015 1252   MCHC 33.6 12/04/2015 1252   RDW 13.1 12/04/2015 1252   LYMPHSABS 1.1 06/20/2013 1142   MONOABS 0.5 06/20/2013 1142   EOSABS 0.1 06/20/2013 1142   BASOSABS 0.0 06/20/2013 1142    Hgb A1C Lab Results  Component Value Date   HGBA1C 5.6 04/09/2013             Assessment & Plan:   Lipoma, left  chest wall:  Discussed its benign nature No need for surgical excision at this time Will monitor  RTC as needed or if symptoms persist or worsen Webb Silversmith, NP

## 2016-02-01 NOTE — Patient Instructions (Signed)

## 2016-02-04 ENCOUNTER — Telehealth: Payer: Self-pay | Admitting: Cardiovascular Disease

## 2016-02-04 NOTE — Telephone Encounter (Signed)
Patient calling the office for samples of medication: ° ° °1.  What medication and dosage are you requesting samples for? Xarelto  20mg ° °2.  Are you currently out of this medication? no ° ° ° °

## 2016-02-07 NOTE — Telephone Encounter (Signed)
Samples up front 

## 2016-02-08 ENCOUNTER — Telehealth: Payer: Self-pay | Admitting: Cardiovascular Disease

## 2016-02-08 NOTE — Telephone Encounter (Signed)
New message     Patient calling the office for samples of medication:   1.  What medication and dosage are you requesting samples for?Xarelto 20 mg po daily  2.  Are you currently out of this medication? Out completely

## 2016-02-08 NOTE — Telephone Encounter (Signed)
Ok to speak to wife, per patient.  Discussed with wife the need for more samples, as they were left some on 01/03/16.  She tells me that it is a cost issue. Will mail, to home address, Xarelto patient assistance forms and LIS program forms.   Advised 1 months of samples left at front desk so they have time to look into cost assistance.

## 2016-03-07 DIAGNOSIS — M25572 Pain in left ankle and joints of left foot: Secondary | ICD-10-CM | POA: Diagnosis not present

## 2016-03-14 ENCOUNTER — Telehealth: Payer: Self-pay | Admitting: Cardiovascular Disease

## 2016-03-14 NOTE — Telephone Encounter (Signed)
New Message  Patient calling the office for samples of medication:   1.  What medication and dosage are you requesting samples for? Xarelto 1 20mg  tablet once daily  2.  Are you currently out of this medication? Yes  Pt voiced he can come by tomorrow and also drop off the application for Xarelto.  Please follow up with pt. Thanks!

## 2016-03-16 ENCOUNTER — Telehealth: Payer: Self-pay

## 2016-03-16 NOTE — Telephone Encounter (Signed)
Patient Assistance Forms completed by patient and Dr. Acie Fredrickson for [place]. Faxed to [employer].

## 2016-05-30 ENCOUNTER — Telehealth: Payer: Self-pay | Admitting: Nurse Practitioner

## 2016-05-30 MED ORDER — RIVAROXABAN 20 MG PO TABS: 20.0000 mg | ORAL_TABLET | Freq: Every day | ORAL | 11 refills | Status: DC

## 2016-05-30 NOTE — Telephone Encounter (Signed)
Spoke with patient in response to message from his wife that he needs samples of Xarelto.  I advised that Dr. Acie Fredrickson has reviewed his chart and patient will need long term plan for anticoagulation; he does not recommend patient take Aspirin alone.  We discussed cost of the various treatment options and he elects to continue Xarelto at this time.  He states he will need occasional assistance with refills if possible.  I advised that we will continue to try to accommodate his needs and he understands that sometimes samples are not available in the office.  He requests samples when he comes to bring wife into the office tomorrow for lab appointment.  He verbalized understanding and agreement with plan and thanked me for the call.

## 2016-06-16 ENCOUNTER — Other Ambulatory Visit: Payer: Self-pay

## 2016-06-22 ENCOUNTER — Ambulatory Visit (INDEPENDENT_AMBULATORY_CARE_PROVIDER_SITE_OTHER): Payer: Medicare Other | Admitting: Cardiovascular Disease

## 2016-06-22 ENCOUNTER — Encounter: Payer: Self-pay | Admitting: Cardiovascular Disease

## 2016-06-22 VITALS — BP 180/80 | HR 61 | Ht 72.0 in | Wt 221.8 lb

## 2016-06-22 DIAGNOSIS — E782 Mixed hyperlipidemia: Secondary | ICD-10-CM

## 2016-06-22 DIAGNOSIS — I480 Paroxysmal atrial fibrillation: Secondary | ICD-10-CM | POA: Diagnosis not present

## 2016-06-22 DIAGNOSIS — I10 Essential (primary) hypertension: Secondary | ICD-10-CM

## 2016-06-22 LAB — LIPID PANEL
Cholesterol: 127 mg/dL (ref <200)
HDL: 44 mg/dL (ref >40)
LDL Cholesterol: 69 mg/dL (ref <100)
TRIGLYCERIDES: 72 mg/dL (ref <150)
Total CHOL/HDL Ratio: 2.9 Ratio (ref <5.0)
VLDL: 14 mg/dL (ref <30)

## 2016-06-22 LAB — COMPREHENSIVE METABOLIC PANEL
ALBUMIN: 4.2 g/dL (ref 3.6–5.1)
ALK PHOS: 75 U/L (ref 40–115)
ALT: 33 U/L (ref 9–46)
AST: 25 U/L (ref 10–35)
BILIRUBIN TOTAL: 0.7 mg/dL (ref 0.2–1.2)
BUN: 14 mg/dL (ref 7–25)
CALCIUM: 8.5 mg/dL — AB (ref 8.6–10.3)
CO2: 28 mmol/L (ref 20–31)
Chloride: 104 mmol/L (ref 98–110)
Creat: 0.94 mg/dL (ref 0.70–1.18)
Glucose, Bld: 122 mg/dL — ABNORMAL HIGH (ref 65–99)
Potassium: 4.2 mmol/L (ref 3.5–5.3)
Sodium: 140 mmol/L (ref 135–146)
Total Protein: 6.3 g/dL (ref 6.1–8.1)

## 2016-06-22 MED ORDER — HYDROCHLOROTHIAZIDE 25 MG PO TABS
25.0000 mg | ORAL_TABLET | Freq: Every day | ORAL | 11 refills | Status: DC
Start: 2016-06-22 — End: 2017-07-09

## 2016-06-22 MED ORDER — POTASSIUM CHLORIDE CRYS ER 20 MEQ PO TBCR: 20.0000 meq | EXTENDED_RELEASE_TABLET | Freq: Every day | ORAL | 11 refills | Status: DC

## 2016-06-22 NOTE — Patient Instructions (Addendum)
Medication Instructions:  START HCTZ (Hydrochlorothiazide) 25 mg once daily START Kdur (potassium chloride) 20 meq once daily   Labwork: TODAY -  Cholesterol, complete metabolic panel  Basic metabolic panel on Friday January 5  Testing/Procedures: None Ordered   Follow-Up: Your physician recommends that you return for a follow-up appointment on Friday Jan. 5 at 10:00 am for Nurse visit/BP check/lab work   Your physician wants you to follow-up in: 6 months with Dr. Acie Fredrickson.  You will receive a reminder letter in the mail two months in advance. If you don't receive a letter, please call our office to schedule the follow-up appointment.   If you need a refill on your cardiac medications before your next appointment, please call your pharmacy.   Thank you for choosing [facility]! Christen Bame, RN [phone]

## 2016-06-22 NOTE — Progress Notes (Signed)
Cardiology Office Note   Date:  06/22/2016   ID:  Vytautas, Barbieri 1946/02/18, MRN [id number]  PCP:  Elsie Stain, MD  Cardiologist: [name] MD - now [place]   Chief Complaint  Patient presents with  . Atrial Fibrillation      Matthew Sullivan is a 70 y.o. male who presents for follow-up scheduled office visit.  The patient has a past history of left atrial myxoma removed by Dr. Roxan Hockey on 04/09/13 The patient does not have any coronary disease by cardiac catheterization . The patient did have problems with postoperative atrial fibrillation. He underwent elective cardioversion on 06/25/13 . Since then he has remained in normal sinus rhythm on tapering doses of amiodarone. We subsequently stopped his amiodarone altogether. He has not been aware of any recurrent atrial fibrillation. He denies chest pain or shortness of breath. 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  However, he had recent left ankle surgery and so he is out of work for a while.  Dr. Percell Miller as his orthopedist.  He has a history of hyperlipidemia. His most recent lipids were on 04/09/15 which showed a cholesterol of 124 and an LDL of 67 and triglycerides of 73.  He is tolerating his statin therapy without side effects.  December 15, 2015: Seen for the first time - transfer from [place] .  Seen for PAF - occurred following excision of an atrial myxoma.  2 weeks ago - he noticed some palpitations He was out working in the heat.   Felt his pulse, went to the ER, was cardioverted in the ER . Is now back on Xarelto.20 mg a day  Continues on metoprolol 25 BID  And atorvastatin 40 mg a day   Dec. 14, 2017:  Doing well.  BP is a bit elevate at home and is elevated here.  Still eating salt - bacon, potted meat, country ham, vienna sausages     Past Medical History:  Diagnosis Date  . Atrial myxoma   . Cervical osteoarthritis   . History of TIA (transient ischemic attack)   .  History of tinnitus   . Hyperlipidemia   . Hypertension   . Melanoma ([place])   . MI, old 69    Past Surgical History:  Procedure Laterality Date  . ANKLE ARTHROSCOPY WITH ARTHRODESIS Left 08/26/2015   Procedure: LEFT ANKLE ARTHROSCOPY TIBIOTALAR ARTHRODESIS;  Surgeon: [name], MD;  Location: [place];  Service: Orthopedics;  Laterality: Left;  . APPENDECTOMY    . BACK SURGERY    . CARDIOVERSION N/A 06/25/2013   Procedure: CARDIOVERSION;  Surgeon: [name], MD;  Location: [facility] ENDOSCOPY;  Service: Cardiovascular;  Laterality: N/A;  . EXCISION OF ATRIAL MYXOMA Left 04/09/2013   Procedure: EXCISION OF ATRIAL MYXOMA;  Surgeon: [name], MD;  Location: [place];  Service: Open Heart Surgery;  Laterality: Left;  . INTRAOPERATIVE TRANSESOPHAGEAL ECHOCARDIOGRAM N/A 04/09/2013   Procedure: INTRAOPERATIVE TRANSESOPHAGEAL ECHOCARDIOGRAM;  Surgeon: [name], MD;  Location: [place];  Service: Open Heart Surgery;  Laterality: N/A;  . LEFT HEART CATHETERIZATION WITH CORONARY ANGIOGRAM N/A 04/07/2013   Procedure: LEFT HEART CATHETERIZATION WITH CORONARY ANGIOGRAM;  Surgeon: [name], MD;  Location: [facility] CATH LAB;  Service: Cardiovascular;  Laterality: N/A;  . NASAL SEPTUM SURGERY    . NOSE SURGERY    . ROTATOR CUFF REPAIR     both shoulders     Current Outpatient Prescriptions  Medication  Sig Dispense Refill  . atorvastatin (LIPITOR) 80 MG tablet Take 0.5 tablets (40 mg total) by mouth at bedtime. 45 tablet 3  . metoprolol tartrate (LOPRESSOR) 25 MG tablet Take 12.5 mg by mouth 2 (two) times daily.    . rivaroxaban (XARELTO) 20 MG TABS tablet Take 1 tablet (20 mg total) by mouth daily with supper. 30 tablet 11   No current facility-administered medications for this visit.     Allergies:   Patient has no known allergies.    Social History:  The patient  reports that he quit smoking about 38 years ago. He has never used smokeless tobacco. He  reports that he drinks about 1.2 oz of alcohol per week . He reports that he does not use drugs.   Family History:  The patient's family history includes Cancer in his father; Colon cancer in his mother; Liver cancer in his mother.    ROS:  Please see the history of present illness.   Otherwise, review of systems are positive for none.   All other systems are reviewed and negative.    PHYSICAL EXAM: VS:  BP (!) 180/80   Pulse 61   Ht 6' (1.829 m)   Wt 221 lb 12.8 oz (100.6 kg)   SpO2 98%   BMI 30.08 kg/m  , BMI Body mass index is 30.08 kg/m. GEN: Well nourished, well developed, in no acute distress  HEENT: normal  Neck: no JVD, carotid bruits, or masses Cardiac: RRR; no murmurs, rubs, or gallops,no edema  Respiratory:  clear to auscultation bilaterally, normal work of breathing GI: soft, nontender, nondistended, + BS MS: no deformity or atrophy  Skin: warm and dry, no rash Neuro:  Strength and sensation are intact Psych: euthymic mood, full affect   EKG:  EKG is not ordered today. The ekg ordered today demonstrates    Recent Labs: 12/04/2015: B Natriuretic Peptide 263.5; BUN 12; Creatinine, Ser 0.82; Hemoglobin 13.8; Platelets 168; Potassium 3.7; Sodium 137    Lipid Panel    Component Value Date/Time   CHOL 124 04/09/2015 0919   TRIG 73.0 04/09/2015 0919   HDL 42.70 04/09/2015 0919   CHOLHDL 3 04/09/2015 0919   VLDL 14.6 04/09/2015 0919   LDLCALC 67 04/09/2015 0919      Wt Readings from Last 3 Encounters:  06/22/16 221 lb 12.8 oz (100.6 kg)  02/01/16 206 lb (93.4 kg)  12/15/15 214 lb 12.8 oz (97.4 kg)        ASSESSMENT AND PLAN:  1. history of left atrial myxoma successfully excised on 04/09/13 2.  Postoperative paroxysmal atrial fibrillation :   CHADS 2VASC = 2 ( age , HTN).    3. hypertensive heart disease without heart failure.  BP remains elevated.   Still eats lots of salt. Will add HCTZ 25 mg a day and Kdur 20 meq a day . Nurse  Visit In 3-4  weeks with a basic metabolic profile.  4. Hypercholesterolemia - will check fasting lipids at his next appt.   5. Osteoarthritis of the left ankle    Current medicines are reviewed at length with the patient today.  The patient does not have concerns regarding medicines.  The following changes have been made:  no change  Labs/ tests ordered today include:  No orders of the defined types were placed in this encounter.     Mertie Moores, MD  06/22/2016 8:18 AM    [address],  [address]  [place], [place]  [address] Pager [phone] Phone: [phone]; Fax: [phone]

## 2016-07-04 ENCOUNTER — Ambulatory Visit: Payer: Medicare Other | Admitting: Family Medicine

## 2016-07-14 ENCOUNTER — Ambulatory Visit (INDEPENDENT_AMBULATORY_CARE_PROVIDER_SITE_OTHER): Payer: Medicare Other | Admitting: Nurse Practitioner

## 2016-07-14 ENCOUNTER — Other Ambulatory Visit: Payer: Medicare Other | Admitting: *Deleted

## 2016-07-14 VITALS — BP 140/68 | Ht 72.0 in | Wt 215.5 lb

## 2016-07-14 DIAGNOSIS — I10 Essential (primary) hypertension: Secondary | ICD-10-CM | POA: Diagnosis not present

## 2016-07-14 DIAGNOSIS — I119 Hypertensive heart disease without heart failure: Secondary | ICD-10-CM

## 2016-07-14 DIAGNOSIS — E782 Mixed hyperlipidemia: Secondary | ICD-10-CM | POA: Diagnosis not present

## 2016-07-14 DIAGNOSIS — I480 Paroxysmal atrial fibrillation: Secondary | ICD-10-CM | POA: Diagnosis not present

## 2016-07-14 NOTE — Progress Notes (Signed)
1.) Reason for visit: BP check  2.) Name of MD requesting visit: Dr. Acie Fredrickson   3.) H&P: Patient presents for BP recheck; at last office visit on 06/22/16, Dr. Acie Fredrickson added HCTZ 25 mg and kdur 20 meq for patient's elevated BP   4.) ROS related to problem: patient denies complaints; BP today 140/68 mmHg, HR 80 bpm  5.) Assessment and plan per MD: advised patient that once bmet results are reviewed by Dr. Acie Fredrickson, I will call him with advice  Per Dr. Acie Fredrickson:  Potassium is in the normal range but he would probably benefit from more potassium  Increase the potassium to 20 meg BID and recheck labs in 3 weeks.

## 2016-07-14 NOTE — Patient Instructions (Signed)
Medication Instructions:  Your physician recommends that you continue on your current medications as directed. Please refer to the Current Medication list given to you today.   Labwork: TODAY - basic metabolic panel - Sharyn Lull will call you with results   Testing/Procedures: None Ordered   Follow-Up: Your physician recommends that you schedule a follow-up appointment in: 6 months with Dr. Acie Fredrickson   If you need a refill on your cardiac medications before your next appointment, please call your pharmacy.   Thank you for choosing [facility]! Christen Bame, RN [phone]

## 2016-07-14 NOTE — Addendum Note (Signed)
Addended by: Eulis Foster on: 07/14/2016 01:20 PM   Modules accepted: Orders

## 2016-07-15 LAB — BASIC METABOLIC PANEL
BUN/Creatinine Ratio: 22 (ref 10–24)
BUN: 16 mg/dL (ref 8–27)
CHLORIDE: 98 mmol/L (ref 96–106)
CO2: 26 mmol/L (ref 18–29)
Calcium: 8.8 mg/dL (ref 8.6–10.2)
Creatinine, Ser: 0.72 mg/dL — ABNORMAL LOW (ref 0.76–1.27)
GFR calc Af Amer: 108 mL/min/{1.73_m2} (ref >59)
GFR calc non Af Amer: 93 mL/min/{1.73_m2} (ref >59)
GLUCOSE: 73 mg/dL (ref 65–99)
Potassium: 3.7 mmol/L (ref 3.5–5.2)
SODIUM: 141 mmol/L (ref 134–144)

## 2016-07-17 MED ORDER — POTASSIUM CHLORIDE CRYS ER 20 MEQ PO TBCR: 20.0000 meq | EXTENDED_RELEASE_TABLET | Freq: Two times a day (BID) | ORAL | 3 refills | Status: DC

## 2016-07-19 ENCOUNTER — Encounter: Payer: Self-pay | Admitting: Family Medicine

## 2016-07-19 ENCOUNTER — Ambulatory Visit (INDEPENDENT_AMBULATORY_CARE_PROVIDER_SITE_OTHER): Payer: Medicare Other | Admitting: Family Medicine

## 2016-07-19 VITALS — BP 130/64 | HR 74 | Temp 98.3°F | Ht 71.0 in | Wt 219.8 lb

## 2016-07-19 DIAGNOSIS — I480 Paroxysmal atrial fibrillation: Secondary | ICD-10-CM | POA: Diagnosis not present

## 2016-07-19 DIAGNOSIS — Z0000 Encounter for general adult medical examination without abnormal findings: Secondary | ICD-10-CM | POA: Diagnosis not present

## 2016-07-19 DIAGNOSIS — Z125 Encounter for screening for malignant neoplasm of prostate: Secondary | ICD-10-CM

## 2016-07-19 DIAGNOSIS — D151 Benign neoplasm of heart: Secondary | ICD-10-CM

## 2016-07-19 DIAGNOSIS — Z23 Encounter for immunization: Secondary | ICD-10-CM | POA: Diagnosis not present

## 2016-07-19 DIAGNOSIS — Z1211 Encounter for screening for malignant neoplasm of colon: Secondary | ICD-10-CM

## 2016-07-19 NOTE — Progress Notes (Signed)
Noted cardiac hx: The patient has a past history of left atrial myxoma removed by Dr. Roxan Hockey on 04/09/13 The patient does not have any coronary disease by cardiac catheterization . The patient did have problems with postoperative atrial fibrillation. He underwent elective cardioversion on 06/25/13 . Since then he has remained in normal sinus rhythm on tapering doses of amiodarone. He subsequently stopped his amiodarone altogether but then had return of A fib treated with second cardioversion per cards.  Still in sinus rhythm per patient report.  No CP now.    H/o lipoma noted on the L chest wall and R arm.    H/o melanoma per derm.    FH colon cancer noted.  D/w patient [id number] for colon cancer screening, including cologuard vs. colonoscopy.  Risks and benefits of both were discussed and patient voiced understanding.  Pt elects [id number].  This is reasonable given his anticoagulation.   Prostate cancer screening and PSA options (with potential risks and benefits of testing vs not testing) were discussed along with recent recs/guidelines.  He will consider testing PSA at this point and update me.  No FH prostate CA noted.    D/w pt about routine vaccination.   PMH and SH reviewed  ROS: Per HPI unless specifically indicated in ROS section   Meds, vitals, and allergies reviewed.   GEN: nad, alert and oriented HEENT: mucous membranes moist NECK: supple w/o LA CV: rrr PULM: ctab, no inc wob ABD: soft, +bs EXT: no edema SKIN: no acute rash lipoma noted on the L chest wall and R arm, patient reassured, d/w pt.

## 2016-07-19 NOTE — Patient Instructions (Addendum)
I'll await your follow up labs with cardiology.   Check with your insurance to see if they will cover the shingles and tetanus shot. I would get a flu shot each fall.   We can work on the pneumonia shots later on.   Take care.  Glad to see you.  Update me as needed.

## 2016-07-19 NOTE — Progress Notes (Signed)
Pre visit review using our clinic review tool, if applicable. No additional management support is needed unless otherwise documented below in the visit note. 

## 2016-07-20 ENCOUNTER — Encounter: Payer: Self-pay | Admitting: Family Medicine

## 2016-07-20 DIAGNOSIS — Z125 Encounter for screening for malignant neoplasm of prostate: Secondary | ICD-10-CM | POA: Insufficient documentation

## 2016-07-20 DIAGNOSIS — Z1211 Encounter for screening for malignant neoplasm of colon: Secondary | ICD-10-CM | POA: Insufficient documentation

## 2016-07-20 DIAGNOSIS — Z0000 Encounter for general adult medical examination without abnormal findings: Secondary | ICD-10-CM | POA: Insufficient documentation

## 2016-07-20 NOTE — Assessment & Plan Note (Signed)
FH colon cancer noted.  D/w patient [id number] for colon cancer screening, including cologuard vs. colonoscopy.  Risks and benefits of both were discussed and patient voiced understanding.  Pt elects [id number].  This is reasonable given his anticoagulation. >25 minutes spent in face to face time with patient, >50% spent in counselling or coordination of care.

## 2016-07-20 NOTE — Assessment & Plan Note (Signed)
Now NSR on exam.  No bleeding.

## 2016-07-20 NOTE — Assessment & Plan Note (Signed)
Prostate cancer screening and PSA options (with potential risks and benefits of testing vs not testing) were discussed along with recent recs/guidelines.  He will consider testing PSA at this point and update me.  No FH prostate CA noted.

## 2016-07-20 NOTE — Assessment & Plan Note (Signed)
Flu vaccine today. See AVS re: other vaccinations.  D/ wpt.

## 2016-07-20 NOTE — Assessment & Plan Note (Signed)
S/p resection.  Per cards.

## 2016-08-08 ENCOUNTER — Other Ambulatory Visit: Payer: Medicare Other | Admitting: *Deleted

## 2016-08-08 DIAGNOSIS — I119 Hypertensive heart disease without heart failure: Secondary | ICD-10-CM

## 2016-08-09 LAB — BASIC METABOLIC PANEL
BUN / CREAT RATIO: 15 (ref 10–24)
BUN: 13 mg/dL (ref 8–27)
CO2: 27 mmol/L (ref 18–29)
CREATININE: 0.84 mg/dL (ref 0.76–1.27)
Calcium: 9 mg/dL (ref 8.6–10.2)
Chloride: 100 mmol/L (ref 96–106)
GFR calc Af Amer: 101 mL/min/{1.73_m2} (ref >59)
GFR calc non Af Amer: 87 mL/min/{1.73_m2} (ref >59)
GLUCOSE: 100 mg/dL — AB (ref 65–99)
POTASSIUM: 4.2 mmol/L (ref 3.5–5.2)
SODIUM: 141 mmol/L (ref 134–144)

## 2016-08-28 DIAGNOSIS — Z1211 Encounter for screening for malignant neoplasm of colon: Secondary | ICD-10-CM | POA: Diagnosis not present

## 2016-08-28 DIAGNOSIS — Z1212 Encounter for screening for malignant neoplasm of rectum: Secondary | ICD-10-CM | POA: Diagnosis not present

## 2016-09-05 LAB — COLOGUARD: Cologuard: NEGATIVE

## 2016-09-12 ENCOUNTER — Encounter: Payer: Self-pay | Admitting: Family Medicine

## 2016-09-26 DIAGNOSIS — L821 Other seborrheic keratosis: Secondary | ICD-10-CM | POA: Diagnosis not present

## 2016-09-26 DIAGNOSIS — Z85820 Personal history of malignant melanoma of skin: Secondary | ICD-10-CM | POA: Diagnosis not present

## 2016-09-26 DIAGNOSIS — D225 Melanocytic nevi of trunk: Secondary | ICD-10-CM | POA: Diagnosis not present

## 2016-09-26 DIAGNOSIS — L814 Other melanin hyperpigmentation: Secondary | ICD-10-CM | POA: Diagnosis not present

## 2016-09-26 DIAGNOSIS — L570 Actinic keratosis: Secondary | ICD-10-CM | POA: Diagnosis not present

## 2016-10-25 ENCOUNTER — Encounter: Payer: Self-pay | Admitting: Nurse Practitioner

## 2016-10-25 ENCOUNTER — Telehealth: Payer: Self-pay | Admitting: Nurse Practitioner

## 2016-10-25 NOTE — Telephone Encounter (Signed)
2 bottles of Xarelto 20 mg given to patient's wife while in clinic to see Dr. Acie Fredrickson

## 2016-11-13 ENCOUNTER — Encounter: Payer: Self-pay | Admitting: Family Medicine

## 2016-11-13 ENCOUNTER — Ambulatory Visit (INDEPENDENT_AMBULATORY_CARE_PROVIDER_SITE_OTHER): Payer: Medicare Other | Admitting: Family Medicine

## 2016-11-13 DIAGNOSIS — B029 Zoster without complications: Secondary | ICD-10-CM

## 2016-11-13 MED ORDER — VALACYCLOVIR HCL 1 G PO TABS: 1000.0000 mg | ORAL_TABLET | Freq: Three times a day (TID) | ORAL | 0 refills | Status: DC

## 2016-11-13 MED ORDER — GABAPENTIN 100 MG PO CAPS: 100.0000 mg | ORAL_CAPSULE | Freq: Three times a day (TID) | ORAL | 1 refills | Status: DC | PRN

## 2016-11-13 NOTE — Patient Instructions (Signed)
Start valtrex.   Use gabapentin as needed for pain, slowly escalate the dose as tolerated.  Sedation caution.  Take care.  Glad to see you.

## 2016-11-13 NOTE — Progress Notes (Signed)
Possible shingles.   He didn't recall having VZV in childhood.   Painful dermatomal rash on the R flank.  Noted in the past week. Atypical skin sensation locally. No other complaints.  Meds, vitals, and allergies reviewed.   ROS: Per HPI unless specifically indicated in ROS section   nad Painful dermatomal rash on the R flank with altered sensation locally.  It does not cross the midline. No other rash.

## 2016-11-13 NOTE — Progress Notes (Signed)
Pre visit review using our clinic review tool, if applicable. No additional management support is needed unless otherwise documented below in the visit note. 

## 2016-11-13 NOTE — Assessment & Plan Note (Signed)
Start Valtrex. Start gabapentin. Routine cautions given. Start low dose. Gradually uptitrate as needed and tolerated. Update me as needed. Pathophysiology discussed with patient. He agrees. He can get shingles vaccination at some point after this is resolved.

## 2016-11-30 DIAGNOSIS — H40033 Anatomical narrow angle, bilateral: Secondary | ICD-10-CM | POA: Diagnosis not present

## 2016-11-30 DIAGNOSIS — H2513 Age-related nuclear cataract, bilateral: Secondary | ICD-10-CM | POA: Diagnosis not present

## 2016-12-08 ENCOUNTER — Telehealth: Payer: Self-pay

## 2016-12-08 NOTE — Telephone Encounter (Signed)
Pt left v/m requesting shingles vaccine sent to [facility]. Pt seen 11/13/16 with shingles. Unable to reach pt to get status of shingles.Please advise.

## 2016-12-10 NOTE — Telephone Encounter (Signed)
1. I need an update on his status re: recent infection.  2. Do they have zostavax vs shingrix and did he check with insurance about coverage?   Let me know.  Thanks.

## 2016-12-11 NOTE — Telephone Encounter (Signed)
Patient says his infection is completely cleared up and he is ready to get the shingles vaccine.  Should he get the Zostavax or wait until the new Shingrix becomes more widely available?

## 2016-12-11 NOTE — Telephone Encounter (Signed)
Patient returned Lugene's call.  Please call patient back at [phone].

## 2016-12-11 NOTE — Telephone Encounter (Addendum)
I don't have a strong opinion.  Either is better than nothing.  I would advise him to call his insurance and see if either is covered and see what he can afford out of pocket.  Also, shingrix is back ordered so if he wants anything in the next few months, then proceed with zostavax.  Thanks.

## 2016-12-11 NOTE — Telephone Encounter (Signed)
Wife advised about the 2 vaccines and will check to see which is covered.  Left message on patient's voicemail to return call for update on recent infection.

## 2016-12-12 NOTE — Telephone Encounter (Signed)
Patient says he will wait for a while for the new Shingrix vs getting both vaccines.

## 2017-01-15 ENCOUNTER — Other Ambulatory Visit: Payer: Self-pay | Admitting: Cardiovascular Disease

## 2017-01-15 MED ORDER — METOPROLOL TARTRATE 25 MG PO TABS: 12.5000 mg | ORAL_TABLET | Freq: Two times a day (BID) | ORAL | 1 refills | Status: DC

## 2017-01-23 ENCOUNTER — Encounter: Payer: Self-pay | Admitting: Cardiovascular Disease

## 2017-01-23 ENCOUNTER — Ambulatory Visit (INDEPENDENT_AMBULATORY_CARE_PROVIDER_SITE_OTHER): Payer: Medicare Other | Admitting: Cardiovascular Disease

## 2017-01-23 VITALS — BP 134/72 | HR 62 | Ht 72.0 in | Wt 221.0 lb

## 2017-01-23 DIAGNOSIS — I480 Paroxysmal atrial fibrillation: Secondary | ICD-10-CM

## 2017-01-23 DIAGNOSIS — Z7901 Long term (current) use of anticoagulants: Secondary | ICD-10-CM | POA: Diagnosis not present

## 2017-01-23 DIAGNOSIS — E782 Mixed hyperlipidemia: Secondary | ICD-10-CM

## 2017-01-23 DIAGNOSIS — I10 Essential (primary) hypertension: Secondary | ICD-10-CM

## 2017-01-23 LAB — COMPREHENSIVE METABOLIC PANEL
A/G RATIO: 2.1 (ref 1.2–2.2)
ALT: 34 IU/L (ref 0–44)
AST: 34 IU/L (ref 0–40)
Albumin: 4.4 g/dL (ref 3.5–4.8)
Alkaline Phosphatase: 69 IU/L (ref 39–117)
BUN/Creatinine Ratio: 20 (ref 10–24)
BUN: 17 mg/dL (ref 8–27)
Bilirubin Total: 0.9 mg/dL (ref 0.0–1.2)
CALCIUM: 8.9 mg/dL (ref 8.6–10.2)
CO2: 22 mmol/L (ref 20–29)
Chloride: 99 mmol/L (ref 96–106)
Creatinine, Ser: 0.85 mg/dL (ref 0.76–1.27)
GFR calc Af Amer: 101 mL/min/{1.73_m2} (ref >59)
GFR, EST NON AFRICAN AMERICAN: 87 mL/min/{1.73_m2} (ref >59)
GLOBULIN, TOTAL: 2.1 g/dL (ref 1.5–4.5)
Glucose: 110 mg/dL — ABNORMAL HIGH (ref 65–99)
POTASSIUM: 4.2 mmol/L (ref 3.5–5.2)
SODIUM: 142 mmol/L (ref 134–144)
TOTAL PROTEIN: 6.5 g/dL (ref 6.0–8.5)

## 2017-01-23 LAB — LIPID PANEL
CHOLESTEROL TOTAL: 137 mg/dL (ref 100–199)
Chol/HDL Ratio: 3 ratio (ref 0.0–5.0)
HDL: 45 mg/dL (ref >39)
LDL Calculated: 72 mg/dL (ref 0–99)
Triglycerides: 98 mg/dL (ref 0–149)
VLDL Cholesterol Cal: 20 mg/dL (ref 5–40)

## 2017-01-23 LAB — CBC
Hematocrit: 40.2 % (ref 37.5–51.0)
Hemoglobin: 13.7 g/dL (ref 13.0–17.7)
MCH: 31.6 pg (ref 26.6–33.0)
MCHC: 34.1 g/dL (ref 31.5–35.7)
MCV: 93 fL (ref 79–97)
PLATELETS: 185 10*3/uL (ref 150–379)
RBC: 4.33 x10E6/uL (ref 4.14–5.80)
RDW: 14 % (ref 12.3–15.4)
WBC: 6.3 10*3/uL (ref 3.4–10.8)

## 2017-01-23 NOTE — Progress Notes (Signed)
Cardiology Office Note   Date:  01/23/2017   ID:  Matthew, Sullivan 02-04-1946, MRN [id number]  PCP:  Tonia Ghent, MD  Cardiologist: [name] MD - now [place]   Chief Complaint  Patient presents with  . Atrial Fibrillation  . Hypertension      Matthew Sullivan is a 71 y.o. male who presents for follow-up scheduled office visit.  The patient has a past history of left atrial myxoma removed by Dr. Roxan Hockey on 04/09/13 The patient does not have any coronary disease by cardiac catheterization . The patient did have problems with postoperative atrial fibrillation. He underwent elective cardioversion on 06/25/13 . Since then he has remained in normal sinus rhythm on tapering doses of amiodarone. We subsequently stopped his amiodarone altogether. He has not been aware of any recurrent atrial fibrillation. He denies chest pain or shortness of breath. 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  However, he had recent left ankle surgery and so he is out of work for a while.  Dr. Percell Miller as his orthopedist.  He has a history of hyperlipidemia. His most recent lipids were on 04/09/15 which showed a cholesterol of 124 and an LDL of 67 and triglycerides of 73.  He is tolerating his statin therapy without side effects.  December 15, 2015: Seen for the first time - transfer from [place] .  Seen for PAF - occurred following excision of an atrial myxoma.  2 weeks ago - he noticed some palpitations He was out working in the heat.   Felt his pulse, went to the ER, was cardioverted in the ER . Is now back on Xarelto.20 mg a day  Continues on metoprolol 25 BID  And atorvastatin 40 mg a day   Dec. 14, 2017:  Doing well.  BP is a bit elevate at home and is elevated here.  Still eating salt - bacon, potted meat, country ham, vienna sausages   January 23, 2017:    Doing well. Works at [employer].   Very busy.  Able to work without CP or dyspnea.   Past Medical  History:  Diagnosis Date  . Atrial myxoma   . Cervical osteoarthritis   . History of TIA (transient ischemic attack)   . History of tinnitus   . Hyperlipidemia   . Hypertension   . Melanoma ([place])    per Dr. Allyson Sabal, local excision, no chemo/no rady tx.     Past Surgical History:  Procedure Laterality Date  . ANKLE ARTHROSCOPY WITH ARTHRODESIS Left 08/26/2015   Procedure: LEFT ANKLE ARTHROSCOPY TIBIOTALAR ARTHRODESIS;  Surgeon: [name], MD;  Location: [place];  Service: Orthopedics;  Laterality: Left;  . APPENDECTOMY    . BACK SURGERY    . CARDIOVERSION N/A 06/25/2013   Procedure: CARDIOVERSION;  Surgeon: [name], MD;  Location: [facility] ENDOSCOPY;  Service: Cardiovascular;  Laterality: N/A;  . EXCISION OF ATRIAL MYXOMA Left 04/09/2013   Procedure: EXCISION OF ATRIAL MYXOMA;  Surgeon: [name], MD;  Location: [place];  Service: Open Heart Surgery;  Laterality: Left;  . INTRAOPERATIVE TRANSESOPHAGEAL ECHOCARDIOGRAM N/A 04/09/2013   Procedure: INTRAOPERATIVE TRANSESOPHAGEAL ECHOCARDIOGRAM;  Surgeon: [name], MD;  Location: [place];  Service: Open Heart Surgery;  Laterality: N/A;  . LEFT HEART CATHETERIZATION WITH CORONARY ANGIOGRAM N/A 04/07/2013   Procedure: LEFT HEART CATHETERIZATION WITH CORONARY ANGIOGRAM;  Surgeon: [name], MD;  Location: [facility] CATH LAB;  Service: Cardiovascular;  Laterality:  N/A;  . NASAL SEPTUM SURGERY    . NOSE SURGERY    . ROTATOR CUFF REPAIR     both shoulders     Current Outpatient Prescriptions  Medication Sig Dispense Refill  . atorvastatin (LIPITOR) 80 MG tablet Take 0.5 tablets (40 mg total) by mouth at bedtime. 45 tablet 3  . hydrochlorothiazide (HYDRODIURIL) 25 MG tablet Take 1 tablet (25 mg total) by mouth daily. 30 tablet 11  . metoprolol tartrate (LOPRESSOR) 25 MG tablet Take 0.5 tablets (12.5 mg total) by mouth 2 (two) times daily. 90 tablet 1  . potassium chloride SA (K-DUR,KLOR-CON) 20 MEQ  tablet Take 1 tablet (20 mEq total) by mouth 2 (two) times daily. 180 tablet 3  . rivaroxaban (XARELTO) 20 MG TABS tablet Take 1 tablet (20 mg total) by mouth daily with supper. 30 tablet 11   No current facility-administered medications for this visit.     Allergies:   Patient has no known allergies.    Social History:  The patient  reports that he quit smoking about 39 years ago. He has never used smokeless tobacco. He reports that he drinks about 1.2 oz of alcohol per week . He reports that he does not use drugs.   Family History:  The patient's family history includes Cancer in his father; Colon cancer in his mother; Liver cancer in his mother.    ROS:  Please see the history of present illness.   Otherwise, review of systems are positive for none.   All other systems are reviewed and negative.    PHYSICAL EXAM: VS:  BP 134/72   Pulse 62   Ht 6' (1.829 m)   Wt 221 lb (100.2 kg)   SpO2 97%   BMI 29.97 kg/m  , BMI Body mass index is 29.97 kg/m. GEN: Well nourished, well developed, in no acute distress  HEENT: normal  Neck: no JVD, carotid bruits, or masses Cardiac: RRR; no murmurs, rubs, or gallops,no edema  Respiratory:  clear to auscultation bilaterally, normal work of breathing GI: soft, nontender, nondistended, + BS MS: no deformity or atrophy  Skin: warm and dry, no rash Neuro:  Strength and sensation are intact Psych: euthymic mood, full affect   EKG:  EKG is ordered today. The ekg ordered today demonstrates  NSR at 60.  NS ST abn.    Recent Labs: 06/22/2016: ALT 33 08/08/2016: BUN 13; Creatinine, Ser 0.84; Potassium 4.2; Sodium 141    Lipid Panel    Component Value Date/Time   CHOL 127 06/22/2016 0846   TRIG 72 06/22/2016 0846   HDL 44 06/22/2016 0846   CHOLHDL 2.9 06/22/2016 0846   VLDL 14 06/22/2016 0846   LDLCALC 69 06/22/2016 0846      Wt Readings from Last 3 Encounters:  01/23/17 221 lb (100.2 kg)  11/13/16 220 lb 4 oz (99.9 kg)  07/19/16 219  lb 12 oz (99.7 kg)      ASSESSMENT AND PLAN:  1. history of left atrial myxoma successfully excised on 04/09/13 2.  Postoperative paroxysmal atrial fibrillation :   CHADS 2VASC = 2 ( age , HTN). Continue xarelto   ECG to day shows normal sinus rhythm   3. hypertensive heart disease - BP is well controlled.   4. Hypercholesterolemia - will check fasting lipids     Current medicines are reviewed at length with the patient today.  The patient does not have concerns regarding medicines.  The following changes have been made:  no change  Labs/ tests ordered today include:  No orders of the defined types were placed in this encounter.     Mertie Moores, MD  01/23/2017 8:13 AM    [address],  [place], [place]  [address] Pager [phone] Phone: [phone]; Fax: [phone]

## 2017-01-23 NOTE — Patient Instructions (Signed)
Medication Instructions:  Your physician recommends that you continue on your current medications as directed. Please refer to the Current Medication list given to you today.   Labwork: TODAY - cholesterol, complete metabolic panel, CBC   Testing/Procedures: None Ordered   Follow-Up: Your physician wants you to follow-up in: 6 months with Dr. Acie Fredrickson.  You will receive a reminder letter in the mail two months in advance. If you don't receive a letter, please call our office to schedule the follow-up appointment.   If you need a refill on your cardiac medications before your next appointment, please call your pharmacy.   Thank you for choosing [facility]! Christen Bame, RN [phone]

## 2017-01-25 ENCOUNTER — Telehealth: Payer: Self-pay | Admitting: Cardiovascular Disease

## 2017-01-25 NOTE — Telephone Encounter (Signed)
New Message    Pt son is calling so that he can get the results for the labwork

## 2017-01-25 NOTE — Telephone Encounter (Signed)
Lab results reviewed with patient who verbalized understanding and thanked me for the call.

## 2017-01-26 DIAGNOSIS — S93691A Other sprain of right foot, initial encounter: Secondary | ICD-10-CM | POA: Diagnosis not present

## 2017-01-31 ENCOUNTER — Ambulatory Visit: Payer: Medicare Other | Admitting: Podiatry

## 2017-02-05 ENCOUNTER — Other Ambulatory Visit: Payer: Self-pay | Admitting: Cardiovascular Disease

## 2017-02-07 ENCOUNTER — Ambulatory Visit: Payer: Medicare Other | Admitting: Podiatry

## 2017-04-05 ENCOUNTER — Telehealth: Payer: Self-pay | Admitting: Nurse Practitioner

## 2017-04-05 NOTE — Telephone Encounter (Signed)
3 bottles Xarelto 20 samples given to patient's wife per patient request

## 2017-06-14 ENCOUNTER — Telehealth: Payer: Self-pay | Admitting: Cardiovascular Disease

## 2017-06-14 NOTE — Telephone Encounter (Signed)
Patient calling the office for samples of medication:   1.  What medication and dosage are you requesting samples for? Xarelto  2.  Are you currently out of this medication? Completely out   .re

## 2017-06-14 NOTE — Telephone Encounter (Signed)
Called patient x2 but did not get an answer and vm is unidentified. Patient does not have a DPR on file. Will try again later.

## 2017-06-15 NOTE — Telephone Encounter (Signed)
Left msg on vm for patient to call the office.

## 2017-06-20 NOTE — Telephone Encounter (Signed)
Patient returned my call and I made him aware that samples were placed at the front desk for him last week. He verbalized his understanding and appreciation.

## 2017-06-22 ENCOUNTER — Other Ambulatory Visit: Payer: Self-pay | Admitting: Neurosurgery

## 2017-06-22 DIAGNOSIS — M4310 Spondylolisthesis, site unspecified: Secondary | ICD-10-CM

## 2017-06-26 ENCOUNTER — Ambulatory Visit
Admission: RE | Admit: 2017-06-26 | Discharge: 2017-06-26 | Disposition: A | Discharge status: Home / Self Care | Payer: Medicare Other | Source: Ambulatory Visit | Attending: Neurosurgery | Admitting: Neurosurgery

## 2017-06-26 DIAGNOSIS — M4310 Spondylolisthesis, site unspecified: Secondary | ICD-10-CM

## 2017-06-26 DIAGNOSIS — M545 Low back pain: Secondary | ICD-10-CM | POA: Diagnosis not present

## 2017-07-09 ENCOUNTER — Other Ambulatory Visit: Payer: Self-pay | Admitting: Cardiovascular Disease

## 2017-07-16 ENCOUNTER — Other Ambulatory Visit: Payer: Self-pay | Admitting: Cardiovascular Disease

## 2017-07-20 ENCOUNTER — Ambulatory Visit (INDEPENDENT_AMBULATORY_CARE_PROVIDER_SITE_OTHER): Payer: Medicare Other | Admitting: Family Medicine

## 2017-07-20 ENCOUNTER — Encounter: Payer: Self-pay | Admitting: Family Medicine

## 2017-07-20 VITALS — BP 132/70 | HR 64 | Temp 97.8°F | Ht 72.0 in | Wt 228.8 lb

## 2017-07-20 DIAGNOSIS — Z23 Encounter for immunization: Secondary | ICD-10-CM

## 2017-07-20 DIAGNOSIS — E7800 Pure hypercholesterolemia, unspecified: Secondary | ICD-10-CM

## 2017-07-20 DIAGNOSIS — Z7189 Other specified counseling: Secondary | ICD-10-CM

## 2017-07-20 DIAGNOSIS — I10 Essential (primary) hypertension: Secondary | ICD-10-CM | POA: Diagnosis not present

## 2017-07-20 DIAGNOSIS — Z0000 Encounter for general adult medical examination without abnormal findings: Secondary | ICD-10-CM

## 2017-07-20 DIAGNOSIS — I480 Paroxysmal atrial fibrillation: Secondary | ICD-10-CM

## 2017-07-20 NOTE — Patient Instructions (Addendum)
Check with your insurance to see if they will cover the shingrix and tetanus shot. Let me know if you want to get the HCV testing done.   Flu and pneumonia shot today.  Take care.  Glad to see you.  Update me as needed.

## 2017-07-20 NOTE — Progress Notes (Signed)
Hypertension, h/o NSTEMI  Using medication without problems or lightheadedness: yes Chest pain with exertion:no Edema:no Short of breath:no   HLD.   Using medications without problems:yes Muscle aches: not from statin, but only from hard work with his job Diet compliance: encouraged.   Exercise:yes, with work.    AF.  On anticoagulation.  He has had some occ bleeding on med with some blood on the tongue at night, may be due to mouth breathing at night.  Per patient, he has discussed with cards prev.  No black stools.  No blood in stools.  He drink a lot of coffee in the AM, encouraged slow taper.    D/w pt about HCV screening.  He'll consider.   Shingles d/w pt.  See AVS.  Flu and PNA d/w pt.   Cologuard neg 2018.  D/w pt.  Prostate cancer screening and PSA options (with potential risks and benefits of testing vs not testing) were discussed along with recent recs/guidelines.  He declined testing PSA at this point. If patient were incapacitated then he would have his wife designated.    PMH and SH reviewed  Meds, vitals, and allergies reviewed.   ROS: Per HPI unless specifically indicated in ROS section   GEN: nad, alert and oriented HEENT: mucous membranes moist NECK: supple w/o LA CV: rrr. PULM: ctab, no inc wob ABD: soft, +bs EXT: no edema SKIN: no acute rash

## 2017-07-22 DIAGNOSIS — Z0000 Encounter for general adult medical examination without abnormal findings: Secondary | ICD-10-CM | POA: Insufficient documentation

## 2017-07-22 DIAGNOSIS — Z7189 Other specified counseling: Secondary | ICD-10-CM | POA: Insufficient documentation

## 2017-07-22 NOTE — Assessment & Plan Note (Signed)
Reasonable control.  Continue as is.  He agrees.  Discussed with patient about diet and exercise.  Previous labs discussed with patient. >25 minutes spent in face to face time with patient, >50% spent in counselling or coordination of care.

## 2017-07-22 NOTE — Assessment & Plan Note (Signed)
He has had some occ bleeding on med with some blood on the tongue at night, may be due to mouth breathing at night.  No blood on tongue at time of exam.  Per patient, he has discussed with cards prev.  No black stools.  No blood in stools.  He drink a lot of coffee in the AM, encouraged slow taper.  No change in meds at this point.  He agrees.

## 2017-07-22 NOTE — Assessment & Plan Note (Signed)
D/w pt about HCV screening.  He'll consider.   Shingles d/w pt.  See AVS.  Flu and PNA d/w pt.   Cologuard neg 2018.  D/w pt.  Prostate cancer screening and PSA options (with potential risks and benefits of testing vs not testing) were discussed along with recent recs/guidelines.  He declined testing PSA at this point. If patient were incapacitated then he would have his wife designated.

## 2017-07-22 NOTE — Assessment & Plan Note (Signed)
Reasonable control.  Continue as is.  He agrees.  Discussed with patient about diet and exercise.  Previous labs discussed with patient.

## 2017-07-22 NOTE — Assessment & Plan Note (Signed)
If patient were incapacitated then he would have his wife designated.   

## 2017-08-20 ENCOUNTER — Ambulatory Visit (INDEPENDENT_AMBULATORY_CARE_PROVIDER_SITE_OTHER): Payer: Medicare Other | Admitting: Cardiovascular Disease

## 2017-08-20 ENCOUNTER — Encounter: Payer: Self-pay | Admitting: Cardiovascular Disease

## 2017-08-20 VITALS — BP 150/76 | HR 55 | Wt 228.0 lb

## 2017-08-20 DIAGNOSIS — I480 Paroxysmal atrial fibrillation: Secondary | ICD-10-CM

## 2017-08-20 DIAGNOSIS — E782 Mixed hyperlipidemia: Secondary | ICD-10-CM

## 2017-08-20 DIAGNOSIS — I10 Essential (primary) hypertension: Secondary | ICD-10-CM | POA: Diagnosis not present

## 2017-08-20 LAB — BASIC METABOLIC PANEL
BUN / CREAT RATIO: 12 (ref 10–24)
BUN: 11 mg/dL (ref 8–27)
CO2: 25 mmol/L (ref 20–29)
CREATININE: 0.91 mg/dL (ref 0.76–1.27)
Calcium: 8.8 mg/dL (ref 8.6–10.2)
Chloride: 100 mmol/L (ref 96–106)
GFR calc Af Amer: 96 mL/min/{1.73_m2} (ref >59)
GFR calc non Af Amer: 83 mL/min/{1.73_m2} (ref >59)
GLUCOSE: 121 mg/dL — AB (ref 65–99)
Potassium: 4 mmol/L (ref 3.5–5.2)
SODIUM: 139 mmol/L (ref 134–144)

## 2017-08-20 LAB — HEPATIC FUNCTION PANEL
ALT: 27 IU/L (ref 0–44)
AST: 22 IU/L (ref 0–40)
Albumin: 4.4 g/dL (ref 3.5–4.8)
Alkaline Phosphatase: 65 IU/L (ref 39–117)
Bilirubin Total: 0.6 mg/dL (ref 0.0–1.2)
Bilirubin, Direct: 0.18 mg/dL (ref 0.00–0.40)
TOTAL PROTEIN: 6.4 g/dL (ref 6.0–8.5)

## 2017-08-20 LAB — LIPID PANEL
CHOL/HDL RATIO: 3.2 ratio (ref 0.0–5.0)
Cholesterol, Total: 114 mg/dL (ref 100–199)
HDL: 36 mg/dL — AB (ref >39)
LDL CALC: 57 mg/dL (ref 0–99)
Triglycerides: 107 mg/dL (ref 0–149)
VLDL CHOLESTEROL CAL: 21 mg/dL (ref 5–40)

## 2017-08-20 NOTE — Progress Notes (Signed)
Cardiology Office Note   Date:  08/20/2017   ID:  Artie, Mcintyre 18-Jul-1945, MRN [id number]  PCP:  Tonia Ghent, MD  Cardiologist: [name] MD - now [place]   Chief Complaint  Patient presents with  . Atrial Fibrillation  . Hypertension  . Hyperlipidemia     Previous notes from [place]:  TEVIN SHILLINGFORD is a 72 y.o. male who presents for follow-up scheduled office visit.  The patient has a past history of left atrial myxoma removed by Dr. Roxan Hockey on 04/09/13 The patient does not have any coronary disease by cardiac catheterization . The patient did have problems with postoperative atrial fibrillation. He underwent elective cardioversion on 06/25/13 . Since then he has remained in normal sinus rhythm on tapering doses of amiodarone. We subsequently stopped his amiodarone altogether. He has not been aware of any recurrent atrial fibrillation. He denies chest pain or shortness of breath. 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  However, he had recent left ankle surgery and so he is out of work for a while.  Dr. Percell Miller as his orthopedist.  He has a history of hyperlipidemia. His most recent lipids were on 04/09/15 which showed a cholesterol of 124 and an LDL of 67 and triglycerides of 73.  He is tolerating his statin therapy without side effects.  December 15, 2015: Seen for the first time - transfer from [place] .  Seen for PAF - occurred following excision of an atrial myxoma.  2 weeks ago - he noticed some palpitations He was out working in the heat.   Felt his pulse, went to the ER, was cardioverted in the ER . Is now back on Xarelto.20 mg a day  Continues on metoprolol 25 BID  And atorvastatin 40 mg a day   Dec. 14, 2017:  Doing well.  BP is a bit elevate at home and is elevated here.  Still eating salt - bacon, potted meat, country ham, vienna sausages   January 23, 2017:    Doing well. Works at [employer].   Very busy.  Able  to work without CP or dyspnea.   Feb. 11, 2019:  Doing well. BP is normal at home.   A bit elevated here Staying busy.   Still does construction  ( replacement windows, doors, vinyl siding) Has hyperlipidemia No major bleeding issues     Past Medical History:  Diagnosis Date  . Atrial myxoma   . Cervical osteoarthritis   . History of TIA (transient ischemic attack)   . History of tinnitus   . Hyperlipidemia   . Hypertension   . Melanoma ([place])    per Dr. Allyson Sabal, local excision, no chemo/no rady tx.     Past Surgical History:  Procedure Laterality Date  . ANKLE ARTHROSCOPY WITH ARTHRODESIS Left 08/26/2015   Procedure: LEFT ANKLE ARTHROSCOPY TIBIOTALAR ARTHRODESIS;  Surgeon: [name], MD;  Location: [facility];  Service: Orthopedics;  Laterality: Left;  . APPENDECTOMY    . BACK SURGERY    . CARDIOVERSION N/A 06/25/2013   Procedure: CARDIOVERSION;  Surgeon: [name], MD;  Location: [facility] ENDOSCOPY;  Service: Cardiovascular;  Laterality: N/A;  . EXCISION OF ATRIAL MYXOMA Left 04/09/2013   Procedure: EXCISION OF ATRIAL MYXOMA;  Surgeon: [name], MD;  Location: [facility];  Service: Open Heart Surgery;  Laterality: Left;  . INTRAOPERATIVE TRANSESOPHAGEAL ECHOCARDIOGRAM N/A 04/09/2013   Procedure: INTRAOPERATIVE TRANSESOPHAGEAL ECHOCARDIOGRAM;  Surgeon: [name],  MD;  Location: [facility];  Service: Open Heart Surgery;  Laterality: N/A;  . LEFT HEART CATHETERIZATION WITH CORONARY ANGIOGRAM N/A 04/07/2013   Procedure: LEFT HEART CATHETERIZATION WITH CORONARY ANGIOGRAM;  Surgeon: [name], MD;  Location: [facility] CATH LAB;  Service: Cardiovascular;  Laterality: N/A;  . NASAL SEPTUM SURGERY    . NOSE SURGERY    . ROTATOR CUFF REPAIR     both shoulders     Current Outpatient Medications  Medication Sig Dispense Refill  . atorvastatin (LIPITOR) 80 MG tablet TAKE 1/2 TABLET BY MOUTH AT BEDTIME. 45 tablet 3  . hydrochlorothiazide (HYDRODIURIL)  25 MG tablet Take 1 tablet (25 mg total) by mouth daily. 90 tablet 1  . metoprolol tartrate (LOPRESSOR) 25 MG tablet TAKE 1/2 TABLET BY MOUTH 2 TIMES DAILY. 90 tablet 1  . potassium chloride SA (K-DUR,KLOR-CON) 20 MEQ tablet Take 1 tablet (20 mEq total) by mouth 2 (two) times daily. 180 tablet 3  . vitamin B-12 (CYANOCOBALAMIN) 1000 MCG tablet Take 1,000 mcg by mouth daily.    Alveda Reasons 20 MG TABS tablet TAKE 1 TABLET BY MOUTH DAILY WITH SUPPER. 30 tablet 6   No current facility-administered medications for this visit.     Allergies:   Patient has no known allergies.    Social History:  The patient  reports that he quit smoking about 40 years ago. he has never used smokeless tobacco. He reports that he drinks about 1.2 oz of alcohol per week. He reports that he does not use drugs.   Family History:  The patient's family history includes Cancer in his father; Colon cancer in his mother; Liver cancer in his mother.    ROS:  Please see the history of present illness.   Otherwise, review of systems are positive for none.   All other systems are reviewed and negative.   Physical Exam: Blood pressure (!) 150/76, pulse (!) 55, weight 228 lb (103.4 kg), SpO2 96 %.  GEN:  Well nourished, well developed in no acute distress HEENT: Normal NECK: No JVD; No carotid bruits LYMPHATICS: No lymphadenopathy CARDIAC: RR RESPIRATORY:  Clear to auscultation without rales, wheezing or rhonchi  ABDOMEN: Soft, non-tender, non-distended MUSCULOSKELETAL:  No edema; No deformity  SKIN: Warm and dry NEUROLOGIC:  Alert and oriented x 3    EKG:     Recent Labs: 01/23/2017: ALT 34; BUN 17; Creatinine, Ser 0.85; Hemoglobin 13.7; Platelets 185; Potassium 4.2; Sodium 142    Lipid Panel    Component Value Date/Time   CHOL 137 01/23/2017 0832   TRIG 98 01/23/2017 0832   HDL 45 01/23/2017 0832   CHOLHDL 3.0 01/23/2017 0832   CHOLHDL 2.9 06/22/2016 0846   VLDL 14 06/22/2016 0846   LDLCALC 72 01/23/2017  0832      Wt Readings from Last 3 Encounters:  08/20/17 228 lb (103.4 kg)  07/20/17 228 lb 12 oz (103.8 kg)  01/23/17 221 lb (100.2 kg)      ASSESSMENT AND PLAN:  1. history of left atrial myxoma successfully excised on 04/09/13  2.  Postoperative paroxysmal atrial fibrillation :   CHADS 2VASC = 2 ( age , HTN).   Doing well.   Maintaining NSR   3. hypertensive heart disease -    BP is typically well controlled.   4. Hypercholesterolemia -   Check labs today   Current medicines are reviewed at length with the patient today.  The patient does not have concerns regarding medicines.  The following  changes have been made:  no change  Labs/ tests ordered today include:  No orders of the defined types were placed in this encounter.     Mertie Moores, MD  08/20/2017 8:36 AM    [address],  [place] [place], [place]  [address] Pager [phone] Phone: [phone]; Fax: [phone]

## 2017-08-20 NOTE — Patient Instructions (Signed)

## 2017-09-25 ENCOUNTER — Other Ambulatory Visit: Payer: Self-pay

## 2017-09-25 DIAGNOSIS — D485 Neoplasm of uncertain behavior of skin: Secondary | ICD-10-CM | POA: Diagnosis not present

## 2017-09-25 DIAGNOSIS — L814 Other melanin hyperpigmentation: Secondary | ICD-10-CM | POA: Diagnosis not present

## 2017-09-25 DIAGNOSIS — D0439 Carcinoma in situ of skin of other parts of face: Secondary | ICD-10-CM | POA: Diagnosis not present

## 2017-09-25 DIAGNOSIS — L821 Other seborrheic keratosis: Secondary | ICD-10-CM | POA: Diagnosis not present

## 2017-09-25 DIAGNOSIS — Z85820 Personal history of malignant melanoma of skin: Secondary | ICD-10-CM | POA: Diagnosis not present

## 2017-10-04 DIAGNOSIS — D0439 Carcinoma in situ of skin of other parts of face: Secondary | ICD-10-CM | POA: Diagnosis not present

## 2017-10-26 ENCOUNTER — Other Ambulatory Visit: Payer: Self-pay | Admitting: Cardiovascular Disease

## 2017-10-26 MED ORDER — RIVAROXABAN 20 MG PO TABS: 20.0000 mg | ORAL_TABLET | Freq: Every day | ORAL | 6 refills | Status: DC

## 2017-10-26 NOTE — Telephone Encounter (Signed)
rx sent

## 2017-10-26 NOTE — Telephone Encounter (Signed)
Patient calling the office for samples of medication:   1.  What medication and dosage are you requesting samples for?    XARELTO 20 MG TABS tablet TAKE 1 TABLET BY MOUTH DAILY WITH SUPPER.   2.  Are you currently out of this medication? Pt is out today is the last day

## 2017-12-04 DIAGNOSIS — Z85828 Personal history of other malignant neoplasm of skin: Secondary | ICD-10-CM | POA: Diagnosis not present

## 2017-12-04 DIAGNOSIS — L905 Scar conditions and fibrosis of skin: Secondary | ICD-10-CM | POA: Diagnosis not present

## 2017-12-17 ENCOUNTER — Other Ambulatory Visit: Payer: Self-pay | Admitting: Cardiovascular Disease

## 2017-12-17 MED ORDER — POTASSIUM CHLORIDE CRYS ER 20 MEQ PO TBCR: 20.0000 meq | EXTENDED_RELEASE_TABLET | Freq: Two times a day (BID) | ORAL | 2 refills | Status: DC

## 2017-12-26 ENCOUNTER — Other Ambulatory Visit: Payer: Self-pay

## 2017-12-26 MED ORDER — METOPROLOL TARTRATE 25 MG PO TABS: 12.5000 mg | ORAL_TABLET | Freq: Two times a day (BID) | ORAL | 0 refills | Status: DC

## 2017-12-26 MED ORDER — HYDROCHLOROTHIAZIDE 25 MG PO TABS: 25.0000 mg | ORAL_TABLET | Freq: Every day | ORAL | 0 refills | Status: DC

## 2017-12-26 MED ORDER — ATORVASTATIN CALCIUM 80 MG PO TABS: 40.0000 mg | ORAL_TABLET | Freq: Every day | ORAL | 0 refills | Status: DC

## 2018-01-24 ENCOUNTER — Ambulatory Visit: Payer: Self-pay

## 2018-01-24 ENCOUNTER — Ambulatory Visit: Payer: Self-pay | Admitting: Family Medicine

## 2018-01-24 NOTE — Telephone Encounter (Signed)
Patient called in with c/o "spider bite." He says "I can't say it is a spider bite, but something bit me 6 days ago on my right ankle, right at the crease. It is about 2 inches around, red, no pain, no itching and not a great amount of swelling. It's more irritated than anything because of where it is located." I asked about other symptoms, he denies, and says no fever. According to protocol, see PCP within 24 hours, no availability with PCP or practice due to patient requested morning appointment. I offered to go to another [place] location, he agreed. Appointment scheduled for tomorrow at 1000 with Mackie Pai, [facility], care advice given, patient verbalized understanding.  Reason for Disposition . [1] Red or very tender (to touch) area AND [2] started over 24 hours after the bite  Answer Assessment - Initial Assessment Questions 1. TYPE of INSECT: "What type of insect was it?"      I don't know 2. ONSET: "When did you get bitten?"      6 days ago 3. LOCATION: "Where is the insect bite located?"      Crease of right ankle 4. REDNESS: "Is the area red or pink?" If so, ask "What size is area of redness?" (inches or cm). "When did the redness start?"     2 inches around, red 5. PAIN: "Is there any pain?" If so, ask: "How bad is it?"  (Scale 1-10; or mild, moderate, severe)     No pain 6. ITCHING: "Does it itch?" If so, ask: "How bad is the itch?"    - MILD: doesn't interfere with normal activities   - MODERATE - SEVERE: interferes with work, school, sleep, or other activities      No 7. SWELLING: "How big is the swelling?" (inches, cm, or compare to coins)     Right side around ankle, not a great amount 8. OTHER SYMPTOMS: "Do you have any other symptoms?"  (e.g., difficulty breathing, hives)     No 9. PREGNANCY: "Is there any chance you are pregnant?" "When was your last menstrual period?"     N/A  Protocols used: INSECT BITE-A-AH

## 2018-01-24 NOTE — Telephone Encounter (Signed)
Pt's wife called to report pt was bitten by a brown recluse spider. Pt was not with caller. Called number provided by wife and left message for pt to call back.  Answer Assessment - Initial Assessment Questions 1. TYPE of SPIDER: "What type of spider was it?"  (e.g., name, unknown, or brief description)     Brown recluse 2. LOCATION: "Where is the bite located?"      ankle 3. PAIN: "Is there any pain?" If so, ask: "How bad is it?"  (Scale 1-10; or mild, moderate, severe)     *No Answer* 4. SWELLING: "How big is the swelling?" (Inches, cm or compare to coins)    5. ONSET: "When did the bite occur?" (Minutes or hours ago)   Monday 6. TETANUS: "When was the last tetanus booster?"  Not known 7. OTHER SYMPTOMS: "Do you have any other symptoms?"  (e.g., muscle cramps, abdominal pain, change in urine color)     *No Answer*  Protocols used: [place]

## 2018-01-25 ENCOUNTER — Ambulatory Visit (INDEPENDENT_AMBULATORY_CARE_PROVIDER_SITE_OTHER): Payer: Medicare Other | Admitting: Medical

## 2018-01-25 ENCOUNTER — Encounter: Payer: Self-pay | Admitting: Medical

## 2018-01-25 ENCOUNTER — Telehealth: Payer: Self-pay | Admitting: Cardiovascular Disease

## 2018-01-25 VITALS — BP 137/78 | HR 58 | Temp 98.0°F | Resp 16 | Ht 72.0 in | Wt 218.4 lb

## 2018-01-25 DIAGNOSIS — L089 Local infection of the skin and subcutaneous tissue, unspecified: Secondary | ICD-10-CM | POA: Diagnosis not present

## 2018-01-25 MED ORDER — DOXYCYCLINE HYCLATE 100 MG PO TABS: 100.0000 mg | ORAL_TABLET | Freq: Two times a day (BID) | ORAL | 0 refills | Status: DC

## 2018-01-25 MED ORDER — CEFTRIAXONE SODIUM 1 G IJ SOLR
1.0000 g | Freq: Once | INTRAMUSCULAR | Status: AC
  Administered 2018-01-25: 1 g via INTRAMUSCULAR

## 2018-01-25 NOTE — Patient Instructions (Addendum)
For probable skin infection post potential insect bite(maybe spider?), we gave you rocephin 1 gram IM and doxycycline oral antibiotic. Rx advisement given.  If area spread, ulcerates or worsens  then be seen in ED.   Follow up on Monday for recheck. Very important.   I

## 2018-01-25 NOTE — Telephone Encounter (Signed)
Patient calling the office for samples of medication:   1.  What medication and dosage are you requesting samples for? Xarelto 20 mg  2.  Are you currently out of this medication? Yes  :  

## 2018-01-25 NOTE — Telephone Encounter (Signed)
Called pt and informed him that I could leave 2 bottles of Xarelto 20 mg tablets and an application for pt assistance at the front desk for him to pick up and if he could please return the application to [employer], LPN, as soon as possible, so she could see about him getting some assistance to get his medication. Pt verbalized understanding.

## 2018-01-25 NOTE — Progress Notes (Signed)
Subjective:    Patient ID: Matthew Sullivan, male    DOB: 09-19-1945, 72 y.o.   MRN: [id number]  HPI  Pt in for rt ankle region rash that started past Saturday. He felt bite/sting type feeling. Saw slight red mark about 1 mm in size. Next day area red and size of his hand 5th digit nail .Then gradual spread to size of about 6 cm x 3 cm. Area is mild tender but not itching.  No fever, no chills or sweats.  Pt does have pain when he walks. 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     Review of Systems  Constitutional: Negative for chills, fatigue and fever.  Respiratory: Negative for cough, chest tightness, shortness of breath and wheezing.   Cardiovascular: Negative for chest pain and palpitations.  Skin:       Rt lower ext rash. See hpi.  Neurological: Negative for dizziness, light-headedness and headaches.  Hematological: Negative for adenopathy. Does not bruise/bleed easily.  Psychiatric/Behavioral: Negative for behavioral problems and confusion. The patient is not nervous/anxious.    Past Medical History:  Diagnosis Date  . Atrial myxoma   . Cervical osteoarthritis   . History of TIA (transient ischemic attack)   . History of tinnitus   . Hyperlipidemia   . Hypertension   . Melanoma ([place])    per Dr. Allyson Sabal, local excision, no chemo/no rady tx.      Social History   Socioeconomic History  . Marital status: Married    Spouse name: Not on file  . Number of children: Not on file  . Years of education: Not on file  . Highest education level: Not on file  Occupational History  . Not on file  Social Needs  . Financial resource strain: Not on file  . Food insecurity:    Worry: Not on file    Inability: Not on file  . Transportation needs:    Medical: Not on file    Non-medical: Not on file  Tobacco Use  . Smoking status: Former Smoker    Last attempt to quit: 07/10/1977    Years since quitting: 40.5  . Smokeless tobacco: Never Used  Substance and Sexual Activity    . Alcohol use: Yes    Alcohol/week: 1.2 oz    Types: 1 Glasses of wine, 1 Cans of beer per week    Comment: rare  . Drug use: No  . Sexual activity: Not on file  Lifestyle  . Physical activity:    Days per week: Not on file    Minutes per session: Not on file  . Stress: Not on file  Relationships  . Social connections:    Talks on phone: Not on file    Gets together: Not on file    Attends religious service: Not on file    Active member of club or organization: Not on file    Attends meetings of clubs or organizations: Not on file    Relationship status: Not on file  . Intimate partner violence:    Fear of current or ex partner: Not on file    Emotionally abused: Not on file    Physically abused: Not on file    Forced sexual activity: Not on file  Other Topics Concern  . 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 ANKLE ARTHROSCOPY WITH ARTHRODESIS Left 08/26/2015  Procedure: LEFT ANKLE ARTHROSCOPY TIBIOTALAR ARTHRODESIS;  Surgeon: [name], MD;  Location: [place];  Service: Orthopedics;  Laterality: Left;  . APPENDECTOMY    . BACK SURGERY    . CARDIOVERSION N/A 06/25/2013   Procedure: CARDIOVERSION;  Surgeon: [name], MD;  Location: [facility] ENDOSCOPY;  Service: Cardiovascular;  Laterality: N/A;  . EXCISION OF ATRIAL MYXOMA Left 04/09/2013   Procedure: EXCISION OF ATRIAL MYXOMA;  Surgeon: [name], MD;  Location: [place];  Service: Open Heart Surgery;  Laterality: Left;  . INTRAOPERATIVE TRANSESOPHAGEAL ECHOCARDIOGRAM N/A 04/09/2013   Procedure: INTRAOPERATIVE TRANSESOPHAGEAL ECHOCARDIOGRAM;  Surgeon: [name], MD;  Location: [place];  Service: Open Heart Surgery;  Laterality: N/A;  . LEFT HEART CATHETERIZATION WITH CORONARY ANGIOGRAM N/A 04/07/2013   Procedure: LEFT HEART CATHETERIZATION WITH CORONARY ANGIOGRAM;  Surgeon: [name], MD;   Location: [facility] CATH LAB;  Service: Cardiovascular;  Laterality: N/A;  . NASAL SEPTUM SURGERY    . NOSE SURGERY    . ROTATOR CUFF REPAIR     both shoulders    Family History  Problem Relation Age of Onset  . Colon cancer Mother   . Liver cancer Mother   . Cancer Father   . Prostate cancer Neg Hx     No Known Allergies  Current Outpatient Medications on File Prior to Visit  Medication Sig Dispense Refill  . atorvastatin (LIPITOR) 80 MG tablet Take 0.5 tablets (40 mg total) by mouth at bedtime. 45 tablet 0  . hydrochlorothiazide (HYDRODIURIL) 25 MG tablet Take 1 tablet (25 mg total) by mouth daily. 90 tablet 0  . metoprolol tartrate (LOPRESSOR) 25 MG tablet Take 0.5 tablets (12.5 mg total) by mouth 2 (two) times daily. 90 tablet 0  . potassium chloride SA (K-DUR,KLOR-CON) 20 MEQ tablet Take 1 tablet (20 mEq total) by mouth 2 (two) times daily. 180 tablet 2  . rivaroxaban (XARELTO) 20 MG TABS tablet Take 1 tablet (20 mg total) by mouth daily with supper. 30 tablet 6  . vitamin B-12 (CYANOCOBALAMIN) 1000 MCG tablet Take 1,000 mcg by mouth daily.     No current facility-administered medications on file prior to visit.     BP 137/78   Pulse (!) 58   Temp 98 F (36.7 C) (Oral)   Resp 16   Ht 6' (1.829 m)   Wt 218 lb 6.4 oz (99.1 kg)   SpO2 99%   BMI 29.62 kg/m       Objective:   Physical Exam  General Mental Status- Alert. General Appearance- Not in acute distress.   Skin General: Color- Normal Color. Moisture- Normal Moisture.  Neck Carotid Arteries- Normal color. Moisture- Normal Moisture. No carotid bruits. No JVD.  Chest and Lung Exam Auscultation: Breath Sounds:-Normal.  Cardiovascular Auscultation:Rythm- Regular. Murmurs & Other Heart Sounds:Auscultation of the heart reveals- No Murmurs.  Abdomen Inspection:-Inspeection Normal. Palpation/Percussion:Note:No mass. Palpation and Percussion of the abdomen reveal- Non Tender, Non Distended + BS, no rebound or  guarding.   Neurologic Cranial Nerve exam:- CN III-XII intact(No nystagmus), symmetric smile. Strength:- 5/5 equal and symmetric strength both upper and lower extremities.  Rt ankle- lateral/medial aspect 6 cm x 3 cm red, moderate warm and faint tender rash. No fluctuance. Faint induration. No ulceration.  Ankle has good range of motion with no pain.    Assessment & Plan:  For probable skin infection post potential insect bite(maybe spider?), we gave you rocephin 1 gram IM and doxycycline oral antibiotic. Rx advisement given.  If area spread, ulcerate or worsen then be seen in ED.   Follow up on Monday for recheck. Very important.   Mackie Pai, PA-C

## 2018-01-28 ENCOUNTER — Encounter: Payer: Self-pay | Admitting: Medical

## 2018-01-28 ENCOUNTER — Ambulatory Visit (INDEPENDENT_AMBULATORY_CARE_PROVIDER_SITE_OTHER): Payer: Medicare Other | Admitting: Medical

## 2018-01-28 VITALS — BP 119/69 | HR 59 | Temp 98.2°F | Resp 16 | Ht 72.0 in | Wt 221.0 lb

## 2018-01-28 DIAGNOSIS — L089 Local infection of the skin and subcutaneous tissue, unspecified: Secondary | ICD-10-CM

## 2018-01-28 NOTE — Progress Notes (Signed)
Subjective:    Patient ID: Matthew Sullivan, male    DOB: Jul 24, 1945, 72 y.o.   MRN: [id number]  HPI   Pt in for follow up.  The area already looks much better along with antibiotic. No fever, no chills, no sweats and the rash area looks much better now. No longer warm, indurated or tender. Not as swollen either.   Review of Systems  Constitutional: Negative for chills, fatigue and fever.  Respiratory: Negative for cough, chest tightness, shortness of breath and wheezing.   Cardiovascular: Negative for chest pain and palpitations.  Gastrointestinal: Negative for abdominal pain.  Musculoskeletal:       Rt lower ext much better. Compared to before.  Skin:       See hpi and exam.  Neurological: Negative for dizziness and headaches.  Hematological: Negative for adenopathy. Does not bruise/bleed easily.  Psychiatric/Behavioral: Negative for behavioral problems and confusion.    Past Medical History:  Diagnosis Date  . Atrial myxoma   . Cervical osteoarthritis   . History of TIA (transient ischemic attack)   . History of tinnitus   . Hyperlipidemia   . Hypertension   . Melanoma ([place])    per Dr. Allyson Sabal, local excision, no chemo/no rady tx.      Social History   Socioeconomic History  . Marital status: Married    Spouse name: Not on file  . Number of children: Not on file  . Years of education: Not on file  . Highest education level: Not on file  Occupational History  . Not on file  Social Needs  . Financial resource strain: Not on file  . Food insecurity:    Worry: Not on file    Inability: Not on file  . Transportation needs:    Medical: Not on file    Non-medical: Not on file  Tobacco Use  . Smoking status: Former Smoker    Last attempt to quit: 07/10/1977    Years since quitting: 40.5  . Smokeless tobacco: Never Used  Substance and Sexual Activity  . Alcohol use: Yes    Alcohol/week: 1.2 oz    Types: 1 Glasses of wine, 1 Cans of beer per week    Comment:  rare  . Drug use: No  . Sexual activity: Not on file  Lifestyle  . Physical activity:    Days per week: Not on file    Minutes per session: Not on file  . Stress: Not on file  Relationships  . Social connections:    Talks on phone: Not on file    Gets together: Not on file    Attends religious service: Not on file    Active member of club or organization: Not on file    Attends meetings of clubs or organizations: Not on file    Relationship status: Not on file  . Intimate partner violence:    Fear of current or ex partner: Not on file    Emotionally abused: Not on file    Physically abused: Not on file    Forced sexual activity: Not on file  Other Topics Concern  . 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 ANKLE ARTHROSCOPY WITH ARTHRODESIS Left 08/26/2015   Procedure: LEFT ANKLE ARTHROSCOPY TIBIOTALAR ARTHRODESIS;  Surgeon: [name], MD;  Location: [place];  Service: Orthopedics;  Laterality: Left;  . APPENDECTOMY    .  BACK SURGERY    . CARDIOVERSION N/A 06/25/2013   Procedure: CARDIOVERSION;  Surgeon: [name], MD;  Location: [facility] ENDOSCOPY;  Service: Cardiovascular;  Laterality: N/A;  . EXCISION OF ATRIAL MYXOMA Left 04/09/2013   Procedure: EXCISION OF ATRIAL MYXOMA;  Surgeon: [name], MD;  Location: [place];  Service: Open Heart Surgery;  Laterality: Left;  . INTRAOPERATIVE TRANSESOPHAGEAL ECHOCARDIOGRAM N/A 04/09/2013   Procedure: INTRAOPERATIVE TRANSESOPHAGEAL ECHOCARDIOGRAM;  Surgeon: [name], MD;  Location: [place];  Service: Open Heart Surgery;  Laterality: N/A;  . LEFT HEART CATHETERIZATION WITH CORONARY ANGIOGRAM N/A 04/07/2013   Procedure: LEFT HEART CATHETERIZATION WITH CORONARY ANGIOGRAM;  Surgeon: [name], MD;  Location: [facility] CATH LAB;  Service: Cardiovascular;  Laterality: N/A;  . NASAL SEPTUM SURGERY    . NOSE SURGERY    .  ROTATOR CUFF REPAIR     both shoulders    Family History  Problem Relation Age of Onset  . Colon cancer Mother   . Liver cancer Mother   . Cancer Father   . Prostate cancer Neg Hx     No Known Allergies  Current Outpatient Medications on File Prior to Visit  Medication Sig Dispense Refill  . atorvastatin (LIPITOR) 80 MG tablet Take 0.5 tablets (40 mg total) by mouth at bedtime. 45 tablet 0  . doxycycline (VIBRA-TABS) 100 MG tablet Take 1 tablet (100 mg total) by mouth 2 (two) times daily. Can give caps or generic. 20 tablet 0  . hydrochlorothiazide (HYDRODIURIL) 25 MG tablet Take 1 tablet (25 mg total) by mouth daily. 90 tablet 0  . metoprolol tartrate (LOPRESSOR) 25 MG tablet Take 0.5 tablets (12.5 mg total) by mouth 2 (two) times daily. 90 tablet 0  . potassium chloride SA (K-DUR,KLOR-CON) 20 MEQ tablet Take 1 tablet (20 mEq total) by mouth 2 (two) times daily. 180 tablet 2  . rivaroxaban (XARELTO) 20 MG TABS tablet Take 1 tablet (20 mg total) by mouth daily with supper. 30 tablet 6  . vitamin B-12 (CYANOCOBALAMIN) 1000 MCG tablet Take 1,000 mcg by mouth daily.     No current facility-administered medications on file prior to visit.     BP 119/69   Pulse (!) 59   Temp 98.2 F (36.8 C) (Oral)   Resp 16   Ht 6' (1.829 m)   Wt 221 lb (100.2 kg)   SpO2 98%   BMI 29.97 kg/m       Objective:   Physical Exam  General Mental Status- Alert. General Appearance- Not in acute distress.   Skin General: Color- Normal Color. Moisture- Normal Moisture.  Chest and Lung Exam Auscultation: Breath Sounds:-Normal.  Cardiovascular Auscultation:Rythm- Regular. Murmurs & Other Heart Sounds:Auscultation of the heart reveals- No Murmurs.  Neurologic Cranial Nerve exam:- CN III-XII intact(No nystagmus), symmetric smile. Strength:- 5/5 equal and symmetric strength both upper and lower extremities.  Rt ankle- lateral/medial aspect 6 cm x 3 cm red, no longer warm and no longer  tender rash. No fluctuance. No  induration. No ulceration.  Ankle has good range of motion with no pain.(the area has not not spread at all)      Assessment & Plan:  The area has much improved with antibiotic treatment. Continue current antibiotic. The area should gradually continue to improve. The bruise will/gradually resolve with time.  Continue current antibiotics until finished.  Follow up as regularly schedule with pcp or as needed  Explained put in cbc last visit and he can get today but declined.  Mackie Pai, PA-C

## 2018-01-28 NOTE — Patient Instructions (Addendum)
The area has much improved with antibiotic treatment. Continue current antibiotic. The area should gradually continue to improve. The bruise will/gradually resolve with time.  Continue current antibiotics until finished.  Follow up as regularly schedule with pcp or as needed

## 2018-03-08 ENCOUNTER — Other Ambulatory Visit: Payer: Self-pay | Admitting: Cardiovascular Disease

## 2018-04-08 DIAGNOSIS — L570 Actinic keratosis: Secondary | ICD-10-CM | POA: Diagnosis not present

## 2018-04-08 DIAGNOSIS — L821 Other seborrheic keratosis: Secondary | ICD-10-CM | POA: Diagnosis not present

## 2018-04-08 DIAGNOSIS — D229 Melanocytic nevi, unspecified: Secondary | ICD-10-CM | POA: Diagnosis not present

## 2018-04-08 DIAGNOSIS — Z85828 Personal history of other malignant neoplasm of skin: Secondary | ICD-10-CM | POA: Diagnosis not present

## 2018-04-08 DIAGNOSIS — Z85820 Personal history of malignant melanoma of skin: Secondary | ICD-10-CM | POA: Diagnosis not present

## 2018-04-13 ENCOUNTER — Other Ambulatory Visit: Payer: Self-pay | Admitting: Cardiovascular Disease

## 2018-04-24 ENCOUNTER — Other Ambulatory Visit: Payer: Self-pay | Admitting: Cardiovascular Disease

## 2018-06-14 ENCOUNTER — Other Ambulatory Visit: Payer: Self-pay | Admitting: Cardiovascular Disease

## 2018-07-08 ENCOUNTER — Other Ambulatory Visit: Payer: Self-pay | Admitting: Neurosurgery

## 2018-07-08 DIAGNOSIS — M4310 Spondylolisthesis, site unspecified: Secondary | ICD-10-CM

## 2018-07-09 ENCOUNTER — Telehealth: Payer: Self-pay | Admitting: *Deleted

## 2018-07-09 NOTE — Telephone Encounter (Signed)
    [facility] Pre-operative Risk Assessment    Request for surgical clearance:  1. What type of surgery is being performed? LUMB ESI  2. When is this surgery scheduled?  TBD   3. What type of clearance is required (medical clearance vs. Pharmacy clearance to hold med vs. Both)?  PHARMACY  4. Are there any medications that need to be held prior to surgery and how long? XARELTO FOR 1 DAY   5. Practice name and name of physician performing surgery?  [facility]   6. What is your office phone number [phone]    7.   What is your office fax number [phone]  8.   Anesthesia type (None, local, MAC, general) ? LOCAL   Jeanann Lewandowsky 07/09/2018, 11:17 AM  _________________________________________________________________   (provider comments below)

## 2018-07-11 NOTE — Telephone Encounter (Signed)
Agree with recommendation to hold for 3 days prior to spinal procedure

## 2018-07-11 NOTE — Telephone Encounter (Addendum)
Patient with diagnosis of afib on xarelto for anticoagulation.    Procedure: lumbar ESI Date of procedure: TBD  CHADS2-VASc score of  5 (CHF, HTN, AGE, DM2, stroke/tia x 2, CAD, AGE, male)  Per office protocol, recommend holding anticoagulation 3 days prior to spinal procedure, however pt is at higher risk with hx of TIA. Will defer to cardiologist for appropriate amount of time off anticoagulation.

## 2018-07-12 NOTE — Telephone Encounter (Signed)
Forwarded to requesting provider via fax Epic function

## 2018-07-12 NOTE — Telephone Encounter (Signed)
   Primary Cardiologist: Mertie Moores, MD  Chart reviewed as part of pre-operative protocol coverage. Given past medical history and time since last visit, based on ACC/AHA guidelines, Matthew Sullivan would be at acceptable risk for the planned procedure without further cardiovascular testing.   Per our pharmacy protocol and pt's covering cardiologist Dr Harrington Challenger- OK to hold Xarelto 3 days prior to spinal procedure.   I will route this recommendation to the requesting party via Epic fax function and remove from pre-op pool.  Please call with questions.  Kerin Ransom, PA-C 07/12/2018, 8:46 AM

## 2018-07-13 ENCOUNTER — Other Ambulatory Visit: Payer: Self-pay | Admitting: Cardiovascular Disease

## 2018-07-16 DIAGNOSIS — Z23 Encounter for immunization: Secondary | ICD-10-CM | POA: Diagnosis not present

## 2018-07-22 ENCOUNTER — Ambulatory Visit
Admission: RE | Admit: 2018-07-22 | Discharge: 2018-07-22 | Disposition: A | Discharge status: Home / Self Care | Payer: Medicare Other | Source: Ambulatory Visit | Attending: Neurosurgery | Admitting: Neurosurgery

## 2018-07-22 ENCOUNTER — Other Ambulatory Visit: Payer: Self-pay | Admitting: Neurosurgery

## 2018-07-22 DIAGNOSIS — M4310 Spondylolisthesis, site unspecified: Secondary | ICD-10-CM

## 2018-07-22 DIAGNOSIS — M5126 Other intervertebral disc displacement, lumbar region: Secondary | ICD-10-CM | POA: Diagnosis not present

## 2018-07-22 MED ORDER — IOPAMIDOL (ISOVUE-M 200) INJECTION 41%
1.0000 mL | Freq: Once | INTRAMUSCULAR | Status: AC
  Administered 2018-07-22: 1 mL via EPIDURAL

## 2018-07-22 MED ORDER — METHYLPREDNISOLONE ACETATE 40 MG/ML INJ SUSP (RADIOLOG
120.0000 mg | Freq: Once | INTRAMUSCULAR | Status: AC
  Administered 2018-07-22: 120 mg via EPIDURAL

## 2018-07-22 NOTE — Discharge Instructions (Signed)

## 2018-07-26 ENCOUNTER — Ambulatory Visit: Payer: Medicare Other

## 2018-07-30 ENCOUNTER — Encounter: Payer: Self-pay | Admitting: Family Medicine

## 2018-07-30 ENCOUNTER — Ambulatory Visit (INDEPENDENT_AMBULATORY_CARE_PROVIDER_SITE_OTHER): Payer: Medicare Other | Admitting: Family Medicine

## 2018-07-30 VITALS — BP 118/62 | HR 64 | Temp 97.8°F | Ht 72.0 in | Wt 227.2 lb

## 2018-07-30 DIAGNOSIS — I10 Essential (primary) hypertension: Secondary | ICD-10-CM | POA: Diagnosis not present

## 2018-07-30 DIAGNOSIS — I480 Paroxysmal atrial fibrillation: Secondary | ICD-10-CM

## 2018-07-30 DIAGNOSIS — E7800 Pure hypercholesterolemia, unspecified: Secondary | ICD-10-CM

## 2018-07-30 DIAGNOSIS — Z0000 Encounter for general adult medical examination without abnormal findings: Secondary | ICD-10-CM | POA: Diagnosis not present

## 2018-07-30 DIAGNOSIS — Z7189 Other specified counseling: Secondary | ICD-10-CM

## 2018-07-30 MED ORDER — METOPROLOL TARTRATE 25 MG PO TABS: 25.0000 mg | ORAL_TABLET | Freq: Every day | ORAL | Status: DC

## 2018-07-30 NOTE — Progress Notes (Signed)
He had cards f/u pending, he'll have labs done there.    Elevated Cholesterol: Using medications without problems:yes Muscle aches: no Diet compliance:yes, encouraged.   Exercise:yes  Hypertension/CAD.   Using medication without problems or lightheadedness: yes Chest pain with exertion:no Edema:no Short of breath:no  H/o TIA/PAF.  Still anticoagulated except for recent injection with time off anticoagulation.  Some occ gum bleeding, when restarting med.  No sx now.    H/o melanoma.  With routine derm f/u pending.    Dr. Carloyn Manner is retiring, with neurosurgery.  He is thing about f/u with Dr. Ronnald Ramp.  He had injection last week.  He is putting up with pain and had some relief with injection.    Shingles d/w pt.  See AVS.  Flu and PNA d/w pt.   Cologuard neg 2018. Prostate cancer screening and PSA options (with potential risks and benefits of testing vs not testing) were discussed along with recent recs/guidelines.  He declined testing PSA at this point. If patient were incapacitated then he would have his wife designated.    PMH and SH reviewed  ROS: Per HPI unless specifically indicated in ROS section   Meds, vitals, and allergies reviewed.   GEN: nad, alert and oriented HEENT: mucous membranes moist NECK: supple w/o LA CV: rrr PULM: ctab, no inc wob ABD: soft, +bs EXT: no edema SKIN: well perfused.

## 2018-07-30 NOTE — Patient Instructions (Addendum)
Check with the pharmacy about the PNA 23 shot and the new shingles shot.   Schedule a visit with Pinson when possible for later this year.   Take care.  Glad to see you.

## 2018-08-01 NOTE — Assessment & Plan Note (Addendum)
Shingles d/w pt.  See AVS.  Flu and PNA d/w pt.   Cologuard neg 2018. Prostate cancer screening and PSA options (with potential risks and benefits of testing vs not testing) were discussed along with recent recs/guidelines.  He declined testing PSA at this point. If patient were incapacitated then he would have his wife designated.    He can schedule a visit with Pinson later this year.  Discussed.

## 2018-08-01 NOTE — Assessment & Plan Note (Signed)
Still anticoagulated except for recent injection with time off anticoagulation.  Some occ gum bleeding, when restarting med.  No sx now.  Continue as is.  He agrees.

## 2018-08-01 NOTE — Assessment & Plan Note (Signed)
Continue statin.  No change in meds.  He agrees.  He has cardiology follow-up pending and will have labs drawn there. >25 minutes spent in face to face time with patient, >50% spent in counselling or coordination of care

## 2018-08-01 NOTE — Assessment & Plan Note (Signed)
Reasonable control. No change in meds. He agrees. 

## 2018-08-01 NOTE — Assessment & Plan Note (Signed)
If patient were incapacitated then he would have his wife designated.

## 2018-09-12 ENCOUNTER — Other Ambulatory Visit: Payer: Self-pay | Admitting: Cardiovascular Disease

## 2018-09-13 NOTE — Telephone Encounter (Signed)
Pt last saw Dr Acie Fredrickson 08/20/17 has upcoming appt scheduled for 10/08/18.  Last labs 08/20/17 Creat 0.91, overdue for labwork placed note on [facility] appt on 10/08/18 to draw CBC and CMP.  Age 73, weight 103.1kg, CrCl 104.  Will refill rx x 1 to get to appt with Nahser on 10/08/18 then reassess labwork to ensure appropriate dosage.

## 2018-10-03 ENCOUNTER — Telehealth: Payer: Self-pay

## 2018-10-03 NOTE — Telephone Encounter (Signed)
Left message for pt to call back about his appt.

## 2018-10-04 NOTE — Telephone Encounter (Signed)
Patient returning call.

## 2018-10-04 NOTE — Telephone Encounter (Signed)
   Primary Cardiologist:  Mertie Moores, MD   Patient contacted.  History reviewed.  No symptoms to suggest any unstable cardiac conditions.  Based on discussion, with current pandemic situation, we will be postponing this appointment for Matthew Sullivan with a plan for f/u in 6-12 wks or sooner if feasible/necessary.  If symptoms change, he has been instructed to contact our office.    Routing to C19 CANCEL pool for tracking (P CV DIV CV19 CANCEL - reason for visit "other.") and assigning priority (1 = 4-6 wks, 2 = 6-12 wks, 3 = >12 wks).   Emmaline Life, RN  10/04/2018 9:54 AM         .

## 2018-10-08 ENCOUNTER — Telehealth: Payer: Self-pay

## 2018-10-08 ENCOUNTER — Ambulatory Visit: Payer: Medicare Other | Admitting: Cardiovascular Disease

## 2018-10-08 NOTE — Telephone Encounter (Signed)
Spoke with pt, he cancelled appt from 3/31 and was rescheduled to 10/22/2018. Confirmed that he does want to cancel and be rescheduled for a later date. Pt was added to the cancel pool.

## 2018-10-17 ENCOUNTER — Other Ambulatory Visit: Payer: Self-pay | Admitting: Cardiovascular Disease

## 2018-10-22 ENCOUNTER — Ambulatory Visit: Payer: Medicare Other | Admitting: Cardiovascular Disease

## 2018-11-05 NOTE — Telephone Encounter (Signed)
Left detailed message that ov with Dr. Acie Fredrickson has been rescheduled for August and to call back to let us know if he has questions or concerns or feels that he needs to be seen before that time

## 2018-11-14 ENCOUNTER — Other Ambulatory Visit: Payer: Self-pay | Admitting: Cardiovascular Disease

## 2018-11-15 NOTE — Telephone Encounter (Signed)
Last OV 08/20/17 (patient visit was rescheduled for august) Scr 0.91 on 08/20/17 crcl 171ml/min Will give enough xarelto until patient can get new labs/OV in august

## 2018-12-11 ENCOUNTER — Other Ambulatory Visit: Payer: Self-pay | Admitting: Cardiovascular Disease

## 2019-01-09 ENCOUNTER — Ambulatory Visit: Payer: Medicare Other

## 2019-01-27 DIAGNOSIS — M5416 Radiculopathy, lumbar region: Secondary | ICD-10-CM | POA: Diagnosis not present

## 2019-01-27 DIAGNOSIS — M542 Cervicalgia: Secondary | ICD-10-CM | POA: Diagnosis not present

## 2019-01-27 DIAGNOSIS — I10 Essential (primary) hypertension: Secondary | ICD-10-CM | POA: Diagnosis not present

## 2019-01-27 DIAGNOSIS — M5412 Radiculopathy, cervical region: Secondary | ICD-10-CM | POA: Diagnosis not present

## 2019-01-27 DIAGNOSIS — Z6830 Body mass index (BMI) 30.0-30.9, adult: Secondary | ICD-10-CM | POA: Diagnosis not present

## 2019-01-27 DIAGNOSIS — M545 Low back pain: Secondary | ICD-10-CM | POA: Diagnosis not present

## 2019-02-12 DIAGNOSIS — D1801 Hemangioma of skin and subcutaneous tissue: Secondary | ICD-10-CM | POA: Diagnosis not present

## 2019-02-12 DIAGNOSIS — L814 Other melanin hyperpigmentation: Secondary | ICD-10-CM | POA: Diagnosis not present

## 2019-02-12 DIAGNOSIS — D485 Neoplasm of uncertain behavior of skin: Secondary | ICD-10-CM | POA: Diagnosis not present

## 2019-02-12 DIAGNOSIS — Z85828 Personal history of other malignant neoplasm of skin: Secondary | ICD-10-CM | POA: Diagnosis not present

## 2019-02-12 DIAGNOSIS — L819 Disorder of pigmentation, unspecified: Secondary | ICD-10-CM | POA: Diagnosis not present

## 2019-02-12 DIAGNOSIS — D229 Melanocytic nevi, unspecified: Secondary | ICD-10-CM | POA: Diagnosis not present

## 2019-02-12 DIAGNOSIS — Z85820 Personal history of malignant melanoma of skin: Secondary | ICD-10-CM | POA: Diagnosis not present

## 2019-02-12 DIAGNOSIS — L570 Actinic keratosis: Secondary | ICD-10-CM | POA: Diagnosis not present

## 2019-02-12 DIAGNOSIS — C44311 Basal cell carcinoma of skin of nose: Secondary | ICD-10-CM | POA: Diagnosis not present

## 2019-02-12 DIAGNOSIS — L821 Other seborrheic keratosis: Secondary | ICD-10-CM | POA: Diagnosis not present

## 2019-03-04 ENCOUNTER — Ambulatory Visit: Payer: Medicare Other | Admitting: Cardiovascular Disease

## 2019-03-04 NOTE — Progress Notes (Deleted)
Cardiology Office Note   Date:  03/04/2019   ID:  [name], [name] 02/21/1946, MRN [id number]  PCP:  Tonia Ghent, MD  Cardiologist: [name] MD - now Nahser   No chief complaint on file.    Previous notes from [place]:  Matthew Sullivan is a 73 y.o. male who presents for follow-up scheduled office visit.  The patient has a past history of left atrial myxoma removed by Dr. Roxan Hockey on 04/09/13 The patient does not have any coronary disease by cardiac catheterization . The patient did have problems with postoperative atrial fibrillation. He underwent elective cardioversion on 06/25/13 . Since then he has remained in normal sinus rhythm on tapering doses of amiodarone. We subsequently stopped his amiodarone altogether. He has not been aware of any recurrent atrial fibrillation. He denies chest pain or shortness of breath. 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  However, he had recent left ankle surgery and so he is out of work for a while.  Dr. Percell Miller as his orthopedist.  He has a history of hyperlipidemia. His most recent lipids were on 04/09/15 which showed a cholesterol of 124 and an LDL of 67 and triglycerides of 73.  He is tolerating his statin therapy without side effects.  December 15, 2015: Seen for the first time - transfer from [place] .  Seen for PAF - occurred following excision of an atrial myxoma.  2 weeks ago - he noticed some palpitations He was out working in the heat.   Felt his pulse, went to the ER, was cardioverted in the ER . Is now back on Xarelto.20 mg a day  Continues on metoprolol 25 BID  And atorvastatin 40 mg a day   Dec. 14, 2017:  Doing well.  BP is a bit elevate at home and is elevated here.  Still eating salt - bacon, potted meat, country ham, vienna sausages   January 23, 2017:    Doing well. Works at [employer].   Very busy.  Able to work without CP or dyspnea.   Feb. 11, 2019:  Doing well. BP is  normal at home.   A bit elevated here Staying busy.   Still does construction  ( replacement windows, doors, vinyl siding) Has hyperlipidemia No major bleeding issues   Aug. 25, 2020    Past Medical History:  Diagnosis Date  . Atrial myxoma   . Cervical osteoarthritis   . History of TIA (transient ischemic attack)   . History of tinnitus   . Hyperlipidemia   . Hypertension   . Melanoma ([place])    per Dr. Allyson Sabal, local excision, no chemo/no rady tx.     Past Surgical History:  Procedure Laterality Date  . ANKLE ARTHROSCOPY WITH ARTHRODESIS Left 08/26/2015   Procedure: LEFT ANKLE ARTHROSCOPY TIBIOTALAR ARTHRODESIS;  Surgeon: [name], MD;  Location: [facility];  Service: Orthopedics;  Laterality: Left;  . APPENDECTOMY    . BACK SURGERY    . CARDIOVERSION N/A 06/25/2013   Procedure: CARDIOVERSION;  Surgeon: [name], MD;  Location[facility] ENDOSCOPY;  Service: Cardiovascular;  Laterality: N/A;  . EXCISION OF ATRIAL MYXOMA Left 04/09/2013   Procedure: EXCISION OF ATRIAL MYXOMA;  Surgeon: [name], MD;  Location: [facility];  Service: Open Heart Surgery;  Laterality: Left;  . INTRAOPERATIVE TRANSESOPHAGEAL ECHOCARDIOGRAM N/A 04/09/2013   Procedure: INTRAOPERATIVE TRANSESOPHAGEAL ECHOCARDIOGRAM;  Surgeon: [name], MD;  Location: [facility];  Service: Open  Heart Surgery;  Laterality: N/A;  . LEFT HEART CATHETERIZATION WITH CORONARY ANGIOGRAM N/A 04/07/2013   Procedure: LEFT HEART CATHETERIZATION WITH CORONARY ANGIOGRAM;  Surgeon: [name], MD;  Location: [facility] CATH LAB;  Service: Cardiovascular;  Laterality: N/A;  . NASAL SEPTUM SURGERY    . NOSE SURGERY    . ROTATOR CUFF REPAIR     both shoulders     Current Outpatient Medications  Medication Sig Dispense Refill  . atorvastatin (LIPITOR) 80 MG tablet TAKE 1/2 TABLET BY MOUTH AT BEDTIME. 45 tablet 2  . hydrochlorothiazide (HYDRODIURIL) 25 MG tablet TAKE 1 TABLET (25 MG TOTAL) BY MOUTH DAILY.  PLEASE MAKE YEARLY APPT WITH DR. Acie Fredrickson FOR FEBRUARY BEFORE ANYMORE REFILLS. 1ST ATTEMPT 90 tablet 3  . metoprolol tartrate (LOPRESSOR) 25 MG tablet TAKE 1/2 TABLET BY MOUTH 2 TIMES DAILY. 90 tablet 3  . potassium chloride SA (K-DUR) 20 MEQ tablet TAKE 1 TABLET BY MOUTH 2 TIMES DAILY. PLEASE MAKE YEARLY APPT WITH DR.NAHSER FOR FEBRUARY FOR FUTURE REFILLS1ST ATTEMPT 180 tablet 0  . vitamin B-12 (CYANOCOBALAMIN) 1000 MCG tablet Take 1,000 mcg by mouth daily.    Alveda Reasons 20 MG TABS tablet TAKE 1 TABLET BY MOUTH DAILY WITH SUPPER. OVERDUE FOR FOLLOW UP 30 tablet 3   No current facility-administered medications for this visit.     Allergies:   Patient has no known allergies.    Social History:  The patient  reports that he quit smoking about 41 years ago. He has never used smokeless tobacco. He reports current alcohol use of about 2.0 standard drinks of alcohol per week. He reports that he does not use drugs.   Family History:  The patient's family history includes Brain cancer in his father; Cancer in his father; Colon cancer in his mother; Liver cancer in his mother.    ROS:  Please see the history of present illness.   Otherwise, review of systems are positive for none.   All other systems are reviewed and negative.   Physical Exam: There were no vitals taken for this visit.  GEN:  Well nourished, well developed in no acute distress HEENT: Normal NECK: No JVD; No carotid bruits LYMPHATICS: No lymphadenopathy CARDIAC: RRR ***, no murmurs, rubs, gallops RESPIRATORY:  Clear to auscultation without rales, wheezing or rhonchi  ABDOMEN: Soft, non-tender, non-distended MUSCULOSKELETAL:  No edema; No deformity  SKIN: Warm and dry NEUROLOGIC:  Alert and oriented x 3    EKG:     Recent Labs: No results found for requested labs within last 8760 hours.    Lipid Panel    Component Value Date/Time   CHOL 114 08/20/2017 0904   TRIG 107 08/20/2017 0904   HDL 36 (L) 08/20/2017 0904    CHOLHDL 3.2 08/20/2017 0904   CHOLHDL 2.9 06/22/2016 0846   VLDL 14 06/22/2016 0846   LDLCALC 57 08/20/2017 0904      Wt Readings from Last 3 Encounters:  07/30/18 227 lb 4 oz (103.1 kg)  01/28/18 221 lb (100.2 kg)  01/25/18 218 lb 6.4 oz (99.1 kg)      ASSESSMENT AND PLAN:  1. history of left atrial myxoma successfully excised on 04/09/13  2.  Postoperative paroxysmal atrial fibrillation :   CHADS 2VASC = 2 ( age , HTN). ***  3. hypertensive heart disease -   ***  4. Hypercholesterolemia -   ***  Current medicines are reviewed at length with the patient today.  The patient does not have concerns regarding medicines.  The following changes have been made:  no change  Labs/ tests ordered today include:  No orders of the defined types were placed in this encounter.     Mertie Moores, MD  03/04/2019 7:51 AM    [address],  [place], [place]  [address] Pager [phone] Phone: [phone]; Fax: [phone]

## 2019-03-21 ENCOUNTER — Other Ambulatory Visit: Payer: Self-pay | Admitting: Cardiovascular Disease

## 2019-03-21 NOTE — Telephone Encounter (Signed)
Pt last saw Dr Acie Fredrickson 08/20/17, overdue for follow-up.  Pt has appt scheduled for 04/23/19. Last labs 08/20/17 Creat 0.91, overdue for labwork, note placed on upcoming appt needs CBC and BMP. Age 73, weight 103.1kg, CrCl 103.86, based on CrCl pt is on appropriate dosage of Xarelto 20mg  QD.  Will refill rx x 1 to get to upcoming appt, then will reassess dosage based on most recent labwork.

## 2019-04-22 ENCOUNTER — Other Ambulatory Visit: Payer: Self-pay

## 2019-04-22 ENCOUNTER — Ambulatory Visit (INDEPENDENT_AMBULATORY_CARE_PROVIDER_SITE_OTHER): Payer: Medicare Other | Admitting: Cardiovascular Disease

## 2019-04-22 ENCOUNTER — Encounter: Payer: Self-pay | Admitting: Cardiovascular Disease

## 2019-04-22 VITALS — BP 142/84 | HR 56 | Ht 72.0 in | Wt 224.8 lb

## 2019-04-22 DIAGNOSIS — R351 Nocturia: Secondary | ICD-10-CM

## 2019-04-22 DIAGNOSIS — I10 Essential (primary) hypertension: Secondary | ICD-10-CM | POA: Diagnosis not present

## 2019-04-22 DIAGNOSIS — I480 Paroxysmal atrial fibrillation: Secondary | ICD-10-CM | POA: Diagnosis not present

## 2019-04-22 LAB — BASIC METABOLIC PANEL
BUN/Creatinine Ratio: 16 (ref 10–24)
BUN: 15 mg/dL (ref 8–27)
CO2: 26 mmol/L (ref 20–29)
Calcium: 8.9 mg/dL (ref 8.6–10.2)
Chloride: 102 mmol/L (ref 96–106)
Creatinine, Ser: 0.95 mg/dL (ref 0.76–1.27)
GFR calc Af Amer: 91 mL/min/{1.73_m2} (ref >59)
GFR calc non Af Amer: 79 mL/min/{1.73_m2} (ref >59)
Glucose: 129 mg/dL — ABNORMAL HIGH (ref 65–99)
Potassium: 4 mmol/L (ref 3.5–5.2)
Sodium: 134 mmol/L (ref 134–144)

## 2019-04-22 LAB — LIPID PANEL
Chol/HDL Ratio: 3.2 ratio (ref 0.0–5.0)
Cholesterol, Total: 139 mg/dL (ref 100–199)
HDL: 44 mg/dL (ref >39)
LDL Chol Calc (NIH): 73 mg/dL (ref 0–99)
Triglycerides: 124 mg/dL (ref 0–149)
VLDL Cholesterol Cal: 22 mg/dL (ref 5–40)

## 2019-04-22 LAB — HEPATIC FUNCTION PANEL
ALT: 24 IU/L (ref 0–44)
AST: 18 IU/L (ref 0–40)
Albumin: 4.4 g/dL (ref 3.7–4.7)
Alkaline Phosphatase: 71 IU/L (ref 39–117)
Bilirubin Total: 0.8 mg/dL (ref 0.0–1.2)
Bilirubin, Direct: 0.18 mg/dL (ref 0.00–0.40)
Total Protein: 6.2 g/dL (ref 6.0–8.5)

## 2019-04-22 LAB — PSA: Prostate Specific Ag, Serum: 3 ng/mL (ref 0.0–4.0)

## 2019-04-22 NOTE — Patient Instructions (Signed)
Medication Instructions:   If you need a refill on your cardiac medications before your next appointment, please call your pharmacy.   Lab work: Your physician recommends that you have lab work today. BMET, PSA, Lipid and Liver Panel.  If you have labs (blood work) drawn today and your tests are completely normal, you will receive your results only by: Marland Kitchen MyChart Message (if you have MyChart) OR . A paper copy in the mail If you have any lab test that is abnormal or we need to change your treatment, we will call you to review the results.  Testing/Procedures: None ordered today.  Follow-Up: At [facility], you and your health needs are our priority.  As part of our continuing mission to provide you with exceptional heart care, we have created designated Provider Care Teams.  These Care Teams include your primary Cardiologist (physician) and Advanced Practice Providers (APPs -  Physician Assistants and Nurse Practitioners) who all work together to provide you with the care you need, when you need it. You will need a follow up appointment in:  1 years.  Please call our office 2 months in advance to schedule this appointment.  You may see Mertie Moores, MD or one of the following Advanced Practice Providers on your designated Care Team: Richardson Dopp, PA-C [place], [place] . Daune Perch, NP

## 2019-04-22 NOTE — Progress Notes (Signed)
Cardiology Office Note   Date:  04/23/2019   ID:  Matthew, Sullivan 26-Dec-1945, MRN [id number]  PCP:  Tonia Ghent, MD  Cardiologist: [name] MD - now Nahser   No chief complaint on file.    Previous notes from [place]:  Matthew Sullivan is a 73 y.o. male who presents for follow-up scheduled office visit.  The patient has a past history of left atrial myxoma removed by Dr. Roxan Hockey on 04/09/13 The patient does not have any coronary disease by cardiac catheterization . The patient did have problems with postoperative atrial fibrillation. He underwent elective cardioversion on 06/25/13 . Since then he has remained in normal sinus rhythm on tapering doses of amiodarone. We subsequently stopped his amiodarone altogether. He has not been aware of any recurrent atrial fibrillation. He denies chest pain or shortness of breath. 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  However, he had recent left ankle surgery and so he is out of work for a while.  Dr. Percell Miller as his orthopedist.  He has a history of hyperlipidemia. His most recent lipids were on 04/09/15 which showed a cholesterol of 124 and an LDL of 67 and triglycerides of 73.  He is tolerating his statin therapy without side effects.  December 15, 2015: Seen for the first time - transfer from [place] .  Seen for PAF - occurred following excision of an atrial myxoma.  2 weeks ago - he noticed some palpitations He was out working in the heat.   Felt his pulse, went to the ER, was cardioverted in the ER . Is now back on Xarelto.20 mg a day  Continues on metoprolol 25 BID  And atorvastatin 40 mg a day   Dec. 14, 2017:  Doing well.  BP is a bit elevate at home and is elevated here.  Still eating salt - bacon, potted meat, country ham, vienna sausages   January 23, 2017:    Doing well. Works at [employer].   Very busy.  Able to work without CP or dyspnea.   Feb. 11, 2019:  Doing well. BP is  normal at home.   A bit elevated here Staying busy.   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 No cp , no dyspnea.  Stays busy with construction work  BP readings are great at home.   A bitl elevated here in the office Tries to avoid salt   He needs to have Mohs surgery on November 2.  He inquired about his Xarelto medication.  He will hold his Xarelto on Oct.   30th, November 1, and then on November 2.  Restart Xarelto when okay with a dermatologist.  He will be at low risk for his upcoming surgery.  Past Medical History:  Diagnosis Date  . Atrial myxoma   . Cervical osteoarthritis   . History of TIA (transient ischemic attack)   . History of tinnitus   . Hyperlipidemia   . Hypertension   . Melanoma ([place])    per Dr. Allyson Sabal, local excision, no chemo/no rady tx.     Past Surgical History:  Procedure Laterality Date  . ANKLE ARTHROSCOPY WITH ARTHRODESIS Left 08/26/2015   Procedure: LEFT ANKLE ARTHROSCOPY TIBIOTALAR ARTHRODESIS;  Surgeon: [name], MD;  Location: [facility];  Service: Orthopedics;  Laterality: Left;  . APPENDECTOMY    . BACK SURGERY    . CARDIOVERSION N/A 06/25/2013   Procedure: CARDIOVERSION;  Surgeon: [name], MD;  Location: [facility] ENDOSCOPY;  Service: Cardiovascular;  Laterality: N/A;  . EXCISION OF ATRIAL MYXOMA Left 04/09/2013   Procedure: EXCISION OF ATRIAL MYXOMA;  Surgeon: [name], MD;  Location: [facility];  Service: Open Heart Surgery;  Laterality: Left;  . INTRAOPERATIVE TRANSESOPHAGEAL ECHOCARDIOGRAM N/A 04/09/2013   Procedure: INTRAOPERATIVE TRANSESOPHAGEAL ECHOCARDIOGRAM;  Surgeon: [name], MD;  Location: [facility];  Service: Open Heart Surgery;  Laterality: N/A;  . LEFT HEART CATHETERIZATION WITH CORONARY ANGIOGRAM  N/A 04/07/2013   Procedure: LEFT HEART CATHETERIZATION WITH CORONARY ANGIOGRAM;  Surgeon: [name], MD;  Location: [facility] CATH LAB;  Service: Cardiovascular;  Laterality: N/A;  . NASAL SEPTUM SURGERY    . NOSE SURGERY    . ROTATOR CUFF REPAIR     both shoulders     Current Outpatient Medications  Medication Sig Dispense Refill  . atorvastatin (LIPITOR) 80 MG tablet TAKE 1/2 TABLET BY MOUTH AT BEDTIME. 45 tablet 2  . hydrochlorothiazide (HYDRODIURIL) 25 MG tablet TAKE 1 TABLET (25 MG TOTAL) BY MOUTH DAILY. PLEASE MAKE YEARLY APPT WITH DR. Acie Fredrickson FOR FEBRUARY BEFORE ANYMORE REFILLS. 1ST ATTEMPT 90 tablet 3  . metoprolol tartrate (LOPRESSOR) 25 MG tablet TAKE 1/2 TABLET BY MOUTH 2 TIMES DAILY. 90 tablet 3  . potassium chloride SA (K-DUR) 20 MEQ tablet TAKE 1 TABLET BY MOUTH 2 TIMES DAILY. PLEASE MAKE YEARLY APPT WITH DR.NAHSER FOR FEBRUARY FOR FUTURE REFILLS1ST ATTEMPT 180 tablet 0  . vitamin B-12 (CYANOCOBALAMIN) 1000 MCG tablet Take 1,000 mcg by mouth daily.    Alveda Reasons 20 MG TABS tablet TAKE 1 TABLET BY MOUTH DAILY WITH SUPPER. OVERDUE FOR FOLLOW UP 30 tablet 1   No current facility-administered medications for this visit.     Allergies:   Patient has no known allergies.    Social History:  The patient  reports that he quit smoking about 41 years ago. He has never used smokeless tobacco. He reports current alcohol use of about 2.0 standard drinks of alcohol per week. He reports that he does not use drugs.   Family History:  The patient's family history includes Brain cancer in his father; Cancer in his father; Colon cancer in his mother; Liver cancer in his mother.    ROS:  Please see the history of present illness.   Otherwise, review of systems are positive for none.   All other systems are reviewed and negative.   Physical Exam: Blood pressure (!) 142/84, pulse (!) 56, height 6' (1.829 m), weight 224 lb 12.8 oz (102 kg), SpO2 98 %.  GEN:  Well nourished, well developed in no  acute distress HEENT: Normal NECK: No JVD; No carotid bruits LYMPHATICS: No lymphadenopathy CARDIAC: RRR , no murmurs, rubs, gallops RESPIRATORY:  Clear to auscultation without rales, wheezing or rhonchi  ABDOMEN: Soft, non-tender, non-distended MUSCULOSKELETAL:  No edema; No deformity  SKIN: Warm and dry NEUROLOGIC:  Alert and oriented x 3   EKG:    April 22, 2019: Sinus bradycardia 56.  Previous inferior wall myocardial infarction.   Recent Labs: 04/22/2019: ALT 24; BUN 15; Creatinine, Ser 0.95; Potassium 4.0; Sodium 134    Lipid Panel    Component Value Date/Time   CHOL 139 04/22/2019 1010   TRIG 124 04/22/2019 1010   HDL 44 04/22/2019 1010   CHOLHDL 3.2 04/22/2019 1010  CHOLHDL 2.9 06/22/2016 0846   VLDL 14 06/22/2016 0846   LDLCALC 73 04/22/2019 1010      Wt Readings from Last 3 Encounters:  04/22/19 224 lb 12.8 oz (102 kg)  07/30/18 227 lb 4 oz (103.1 kg)  01/28/18 221 lb (100.2 kg)      ASSESSMENT AND PLAN:  1. history of left atrial myxoma successfully excised on 04/09/13  2.  Postoperative paroxysmal atrial fibrillation :   CHADS 2VASC = 2 ( age , HTN). Remains in normal sinus rhythm.  Continue current medications. OK to hold Xarelto for 2 days prior to Mohs surgery on his face ( very close to his right eye)  He is at low risk for his surgery   3. hypertensive heart disease -    blood pressures typically well controlled at home.  Continue to watch his diet.  Continue exercise.  4. Hypercholesterolemia -   Continue current medications.  Will check labs today. He is not had a PSA drawn in approximately 8 years.  We will add a PSA to his blood work.  Current medicines are reviewed at length with the patient today.  The patient does not have concerns regarding medicines.  The following changes have been made:  no change  Labs/ tests ordered today include:   Orders Placed This Encounter  Procedures  . Basic metabolic panel  . Lipid panel  .  Hepatic function panel  . PSA  . EKG 12-Lead      Mertie Moores, MD  04/23/2019 5:48 PM    [address],  [place], [place]  [address] Pager [phone] Phone: [phone]; Fax: [phone]

## 2019-04-23 ENCOUNTER — Ambulatory Visit: Payer: Medicare Other | Admitting: Cardiovascular Disease

## 2019-05-05 ENCOUNTER — Other Ambulatory Visit: Payer: Self-pay | Admitting: Cardiovascular Disease

## 2019-05-07 ENCOUNTER — Telehealth: Payer: Self-pay | Admitting: Cardiovascular Disease

## 2019-05-07 NOTE — Telephone Encounter (Signed)
I spoke to the patient who said that he has experienced irregular heart beats 3 of the last 4 mornings.  It lasts until about 10:30-11:00 and then does not recur for the remainder of the day.    He normally drinks "10 cups of coffee" every morning, but has cut back to 2-3 cups recently.  His BP today was 131/68 HR 60s, asymptomatic.  I told him that he really needs to reduce his caffeine intake and keep [place] updated if any symptoms occur.

## 2019-05-07 NOTE — Telephone Encounter (Signed)
Agree that he should reduce his coffee intake

## 2019-05-07 NOTE — Telephone Encounter (Signed)
° °  Patient calling to report that he has been having arrhthymias each morning for last 3 days. BP 127/72 HR 68

## 2019-05-08 NOTE — Telephone Encounter (Signed)
I spoke with the patient with Dr Elmarie Shiley recommendation to reduce caffeine intake.  He verbalized understanding.

## 2019-05-08 NOTE — Telephone Encounter (Signed)
Prescription refill request for Xarelto received.   Last office visit: (04-22-2019), Dr. Acie Fredrickson Weight: 102 kg  Age: 73 y.o. Scr: 0.95 (04-22-2019) CrCl: 98 ml/min   Prescription refill sent.

## 2019-05-12 ENCOUNTER — Encounter: Payer: Self-pay | Admitting: Family Medicine

## 2019-05-12 DIAGNOSIS — C441122 Basal cell carcinoma of skin of right lower eyelid, including canthus: Secondary | ICD-10-CM | POA: Diagnosis not present

## 2019-06-04 ENCOUNTER — Other Ambulatory Visit: Payer: Self-pay

## 2019-06-04 IMAGING — CT CT L SPINE W/O CM
3 of 9 series · 6 of 20 positions shown, 7 images · non-contrast
Comparison: MRI 03/09/2011

CLINICAL DATA: Low back pain and right hip and leg pain over the
last 4 months.

EXAM:
CT LUMBAR SPINE WITHOUT CONTRAST
TECHNIQUE: Multidetector CT imaging of the lumbar spine was performed without
intravenous contrast administration. Multiplanar CT image
reconstructions were also generated.

[Series 4: l spine bone · axial · 0.27mm/px · z∈[-227,-147]mm · 2 of 96 slices shown]
[im 32/96  bone]
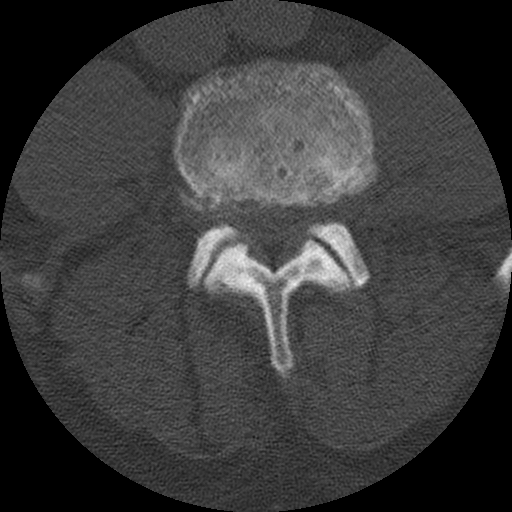
[im 64/96  bone]
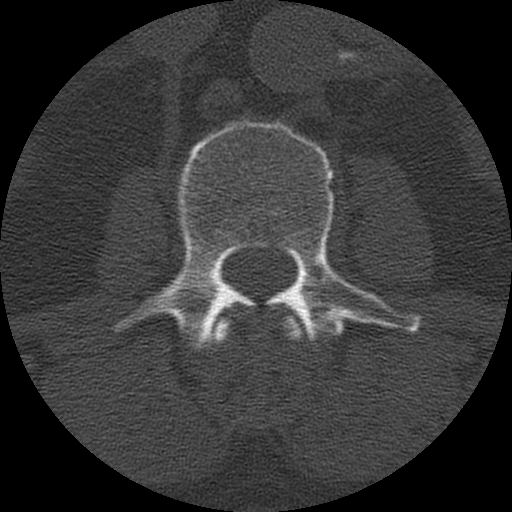

[Series 5: l spine detail · axial · 0.27mm/px · z∈[-247,-127]mm · 3 of 96 slices shown, 4 images]
[im 24/96  soft-tissue]
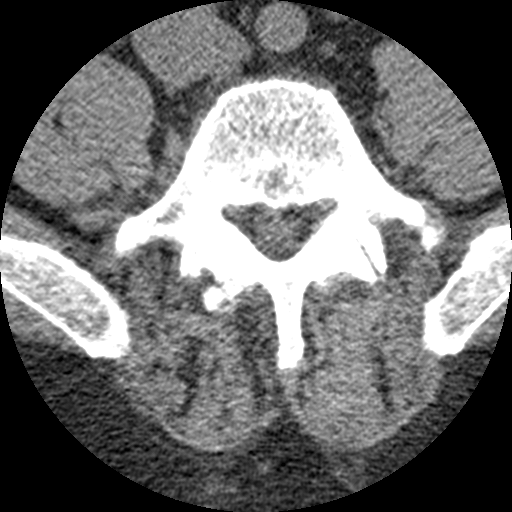
[im 24/96  bone]
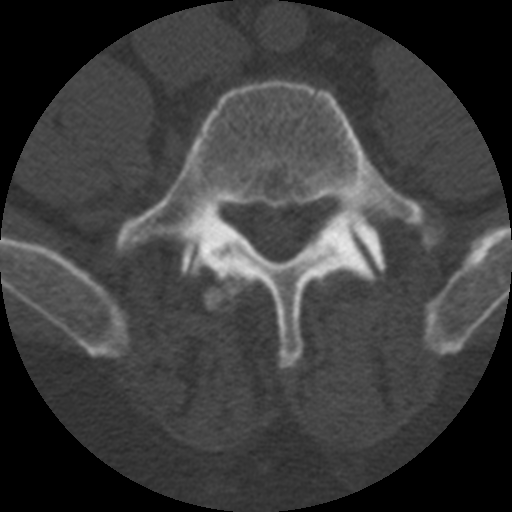
[im 48/96  bone]
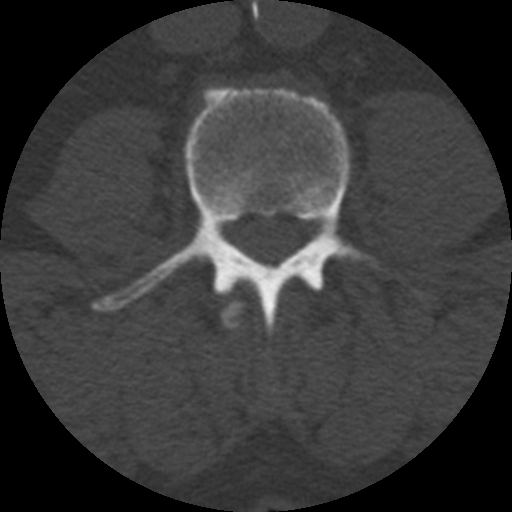
[im 72/96  bone]
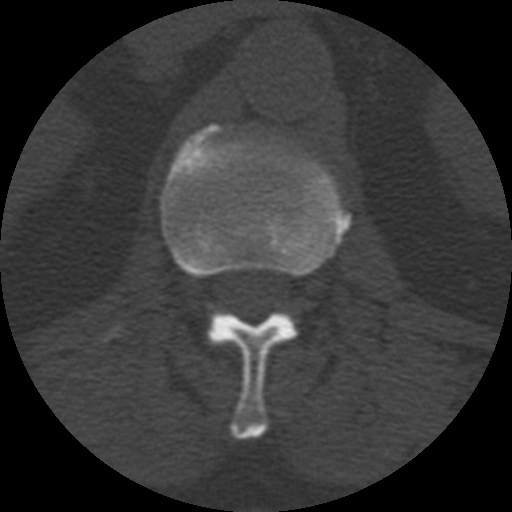

[Series 200: cor bone · coronal · 0.48mm/px · 1 of 53 slices shown]
[im 27/53  bone]
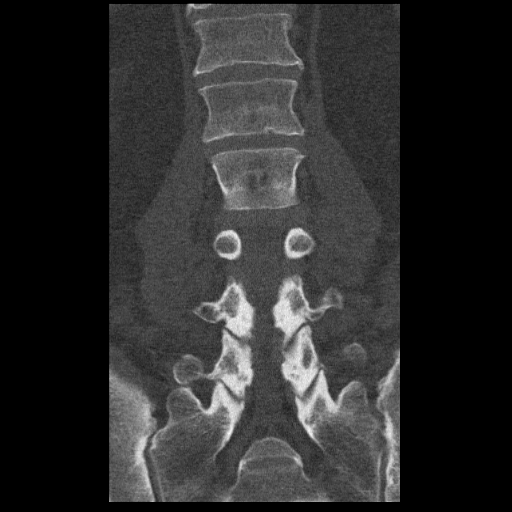

[6 of 20 positions shown; findings below may reference images not displayed]

FINDINGS: Segmentation: 5 lumbar type vertebral bodies.

Alignment: Straightening of the normal lumbar lordosis.

Vertebrae: No fracture or primary bone lesion.

Paraspinal and other soft tissues: Negative. Aortic atherosclerosis
as expected.

Disc levels: T12-L1:  Normal.

L1-2:  Normal.

L2-3: Bulging of the disc. Facet and ligamentous hypertrophy. Mild
stenosis without likely neural compression.

L3-4: Bulging of the disc. Bilateral facet arthropathy with vacuum
phenomenon. Mild to moderate multifactorial stenosis. This
appearance could worsen with flexion or standing.

L4-5: Disc space narrowing. Endplate osteophytes and circumferential
protrusion of the disc. Facet and ligamentous hypertrophy. Severe
multifactorial stenosis at this level. Bilateral foraminal stenosis
right worse than left.

L5-S1: Degeneration and bulging of the disc. Facet osteoarthritis.
No canal stenosis. Mild foraminal narrowing right more than left.

Bilateral sacroiliac osteoarthritis.
IMPRESSION: L4-5: Severe multifactorial spinal stenosis likely to be
symptomatic. Foraminal stenosis right worse than left.

L3-4: Mild to moderate multifactorial stenosis that could worsen
with standing or flexion based on the morphology of the facet
arthropathy.

L2-3: Mild stenosis without likely neural compression.

L5-S1: Disc degeneration and facet osteoarthritis. Mild foraminal
narrowing right more than left.

## 2019-06-16 ENCOUNTER — Encounter (HOSPITAL_COMMUNITY): Payer: Self-pay | Admitting: Emergency Medicine

## 2019-06-16 ENCOUNTER — Emergency Department (HOSPITAL_COMMUNITY): Payer: Medicare Other

## 2019-06-16 ENCOUNTER — Emergency Department (HOSPITAL_COMMUNITY)
Admission: EM | Admit: 2019-06-16 | Discharge: 2019-06-16 | Disposition: A | Discharge status: Home / Self Care | Payer: Medicare Other | Attending: Emergency Medicine | Admitting: Emergency Medicine

## 2019-06-16 ENCOUNTER — Other Ambulatory Visit: Payer: Self-pay

## 2019-06-16 DIAGNOSIS — Z8673 Personal history of transient ischemic attack (TIA), and cerebral infarction without residual deficits: Secondary | ICD-10-CM | POA: Diagnosis not present

## 2019-06-16 DIAGNOSIS — I4891 Unspecified atrial fibrillation: Secondary | ICD-10-CM | POA: Diagnosis not present

## 2019-06-16 DIAGNOSIS — Z87891 Personal history of nicotine dependence: Secondary | ICD-10-CM | POA: Diagnosis not present

## 2019-06-16 DIAGNOSIS — I10 Essential (primary) hypertension: Secondary | ICD-10-CM | POA: Diagnosis not present

## 2019-06-16 DIAGNOSIS — Z79899 Other long term (current) drug therapy: Secondary | ICD-10-CM | POA: Insufficient documentation

## 2019-06-16 DIAGNOSIS — Z7901 Long term (current) use of anticoagulants: Secondary | ICD-10-CM | POA: Diagnosis not present

## 2019-06-16 DIAGNOSIS — R200 Anesthesia of skin: Secondary | ICD-10-CM | POA: Diagnosis not present

## 2019-06-16 DIAGNOSIS — R079 Chest pain, unspecified: Secondary | ICD-10-CM | POA: Diagnosis not present

## 2019-06-16 LAB — CBC
HCT: 42.2 % (ref 39.0–52.0)
Hemoglobin: 13.6 g/dL (ref 13.0–17.0)
MCH: 31.4 pg (ref 26.0–34.0)
MCHC: 32.2 g/dL (ref 30.0–36.0)
MCV: 97.5 fL (ref 80.0–100.0)
Platelets: 125 10*3/uL — ABNORMAL LOW (ref 150–400)
RBC: 4.33 MIL/uL (ref 4.22–5.81)
RDW: 14 % (ref 11.5–15.5)
WBC: 8.6 10*3/uL (ref 4.0–10.5)
nRBC: 0 % (ref 0.0–0.2)

## 2019-06-16 LAB — BASIC METABOLIC PANEL
Anion gap: 8 (ref 5–15)
BUN: 15 mg/dL (ref 8–23)
CO2: 28 mmol/L (ref 22–32)
Calcium: 9 mg/dL (ref 8.9–10.3)
Chloride: 103 mmol/L (ref 98–111)
Creatinine, Ser: 0.93 mg/dL (ref 0.61–1.24)
GFR calc Af Amer: >60 mL/min (ref >60)
GFR calc non Af Amer: >60 mL/min (ref >60)
Glucose, Bld: 104 mg/dL — ABNORMAL HIGH (ref 70–99)
Potassium: 3.6 mmol/L (ref 3.5–5.1)
Sodium: 139 mmol/L (ref 135–145)

## 2019-06-16 LAB — TROPONIN I (HIGH SENSITIVITY)
Troponin I (High Sensitivity): 5 ng/L (ref <18)
Troponin I (High Sensitivity): 2 ng/L (ref <18)

## 2019-06-16 MED ORDER — SODIUM CHLORIDE 0.9% FLUSH: 3.0000 mL | Freq: Once | INTRAVENOUS | Status: DC

## 2019-06-16 NOTE — Discharge Instructions (Addendum)
As discussed, your evaluation today has been largely reassuring.  But, it is important that you monitor your condition carefully, and do not hesitate to return to the ED if you develop new, or concerning changes in your condition.  Otherwise, please follow-up with your physician for appropriate ongoing care.  Please be sure to discuss today's evaluation, and your episode of numbness, clenching in the right hand.  Stabilities include TIA, occult stroke, or transient nerve dysfunction. In regards to your morning episodes of thready pulses, please consider medication changes, or referral to endocrinology.

## 2019-06-16 NOTE — ED Notes (Signed)
Pt called x3 in the waiting room. No response. 

## 2019-06-16 NOTE — ED Provider Notes (Addendum)
[facility] EMERGENCY DEPARTMENT Provider Note   CSN: [id number] Arrival date & time: 06/16/19  1236     History   Chief Complaint Chief Complaint  Patient presents with   Atrial Fibrillation   Numbness    HPI Matthew Sullivan is a 73 y.o. male.     HPI Presents after an episode of right forearm numbness and contraction. This occurred about 10 hours ago, resolved after about 5 minutes, and has not had a recurrence. Currently patient has no complaints. Larger context, the patient has a history of A. fib, takes his anticoagulant regularly. He notes that for about the past 2 months he has had episodes of palpable thready pulse, without palpitations or chest pain or dyspnea. He has both with physician multiple times, has had advice such as to stop drinking coffee, which is decaf already, but he followed. However, he continues to have these episodes every morning. Today's evaluation is occurring at 7 PM. Today, his arm dysesthesia occurred without obvious precipitant, but patient conjecture is that he may have fallen asleep awkwardly during a nap. Symptoms improved after 5, possibly 10 minutes, with complete resolution of strength and sensation. Past Medical History:  Diagnosis Date   Atrial myxoma    Cervical osteoarthritis    History of TIA (transient ischemic attack)    History of tinnitus    Hyperlipidemia    Hypertension    Melanoma ([place])    per Dr. Allyson Sabal, local excision, no chemo/no rady tx.     Patient Active Problem List   Diagnosis Date Noted   Health care maintenance 07/22/2017   Advance care planning 07/22/2017   Current use of long term anticoagulation 01/23/2017   Shingles 11/13/2016   Colon cancer screening 07/20/2016   PAF (paroxysmal atrial fibrillation) ([place]) 04/29/2013   Myxoma of heart 04/07/2013   NSTEMI  04/06/2013   HTN (hypertension) 04/05/2013   Syncope 04/05/2013   Benign hypertensive heart disease  without heart failure 06/22/2011   Pure hypercholesterolemia 06/22/2011    Past Surgical History:  Procedure Laterality Date   ANKLE ARTHROSCOPY WITH ARTHRODESIS Left 08/26/2015   Procedure: LEFT ANKLE ARTHROSCOPY TIBIOTALAR ARTHRODESIS;  Surgeon: [name], MD;  Location: [facility];  Service: Orthopedics;  Laterality: Left;   APPENDECTOMY     BACK SURGERY     CARDIOVERSION N/A 06/25/2013   Procedure: CARDIOVERSION;  Surgeon: [name], MD;  Location: [facility];  Service: Cardiovascular;  Laterality: N/A;   EXCISION OF ATRIAL MYXOMA Left 04/09/2013   Procedure: EXCISION OF ATRIAL MYXOMA;  Surgeon: [name], MD;  Location: [facility];  Service: Open Heart Surgery;  Laterality: Left;   INTRAOPERATIVE TRANSESOPHAGEAL ECHOCARDIOGRAM N/A 04/09/2013   Procedure: INTRAOPERATIVE TRANSESOPHAGEAL ECHOCARDIOGRAM;  Surgeon: [name], MD;  Location: [facility];  Service: Open Heart Surgery;  Laterality: N/A;   LEFT HEART CATHETERIZATION WITH CORONARY ANGIOGRAM N/A 04/07/2013   Procedure: LEFT HEART CATHETERIZATION WITH CORONARY ANGIOGRAM;  Surgeon: [name], MD;  [facility] C[facility] LAB;  Service: Cardiovascular;  Laterality: N/A;   NASAL SEPTUM SURGERY     NOSE SURGERY     ROTATOR CUFF REPAIR     both shoulders        Home Medications    Prior to Admission medications   Medication Sig Start Date End Date Taking? Authorizing Provider  atorvastatin (LIPITOR) 80 MG tablet TAKE 1/2 TABLET BY MOUTH AT BEDTIME. Patient taking differently: Take 40 mg by mouth at bedtime.  12/11/18  Yes Nahser, Wonda Cheng, MD  hydrochlorothiazide (HYDRODIURIL) 25 MG tablet TAKE 1 TABLET (25 MG TOTAL) BY MOUTH DAILY. PLEASE MAKE YEARLY APPT WITH DR. Acie Fredrickson FOR FEBRUARY BEFORE ANYMORE REFILLS. 1ST ATTEMPT 10/17/18  Yes Nahser, Wonda Cheng, MD  metoprolol tartrate (LOPRESSOR) 25 MG tablet TAKE 1/2 TABLET BY MOUTH 2 TIMES DAILY. Patient taking differently: Take 12.5 mg by  mouth 2 (two) times daily.  10/17/18  Yes Nahser, Wonda Cheng, MD  potassium chloride SA (KLOR-CON) 20 MEQ tablet TAKE 1 TABLET BY MOUTH 2 TIMES DAILY. Patient taking differently: Take 20 mEq by mouth 2 (two) times daily.  05/08/19  Yes Nahser, Wonda Cheng, MD  XARELTO 20 MG TABS tablet TAKE 1 TABLET BY MOUTH DAILY WITH SUPPER. OVERDUE FOR FOLLOW UP Patient taking differently: Take 20 mg by mouth daily with supper.  05/08/19  Yes Nahser, Wonda Cheng, MD    Family History Family History  Problem Relation Age of Onset   Colon cancer Mother    Liver cancer Mother    Cancer Father    Brain cancer Father        glioblastoma   Prostate cancer Neg Hx     Social History Social History   Tobacco Use   Smoking status: Former Smoker    Quit date: 07/10/1977    Years since quitting: 41.9   Smokeless tobacco: Never Used  Substance Use Topics   Alcohol use: Yes    Alcohol/week: 2.0 standard drinks    Types: 1 Glasses of wine, 1 Cans of beer per week    Comment: rare   Drug use: No     Allergies   Patient has no known allergies.   Review of Systems Review of Systems  Constitutional:       Per HPI, otherwise negative  HENT:       Per HPI, otherwise negative  Respiratory:       Per HPI, otherwise negative  Cardiovascular:       Per HPI, otherwise negative  Gastrointestinal: Negative for vomiting.  Endocrine:       Negative aside from HPI  Genitourinary:       Neg aside from HPI   Musculoskeletal:       Per HPI, otherwise negative  Skin: Negative.   Neurological: Negative for syncope.     Physical Exam Updated Vital Signs BP 131/65    Pulse (!) 56    Resp 12    SpO2 100%   Physical Exam Vitals signs and nursing note reviewed.  Constitutional:      General: He is not in acute distress.    Appearance: He is well-developed.  HENT:     Head: Normocephalic and atraumatic.  Eyes:     Conjunctiva/sclera: Conjunctivae normal.  Cardiovascular:     Rate and Rhythm: Normal  rate and regular rhythm.  Pulmonary:     Effort: Pulmonary effort is normal. No respiratory distress.     Breath sounds: No stridor.  Abdominal:     General: There is no distension.  Skin:    General: Skin is warm and dry.  Neurological:     General: No focal deficit present.     Mental Status: He is alert and oriented to person, place, and time.     Motor: No weakness or atrophy.     Coordination: Coordination normal.      ED Treatments / Results  Labs (all labs ordered are listed, but only abnormal results are displayed) Labs Reviewed  BASIC METABOLIC PANEL - Abnormal; Notable for the following components:      Result Value   Glucose, Bld 104 (*)    All other components within normal limits  CBC - Abnormal; Notable for the following components:   Platelets 125 (*)    All other components within normal limits  TROPONIN I (HIGH SENSITIVITY)  TROPONIN I (HIGH SENSITIVITY)    EKG EKG Interpretation  Date/Time:  Monday June 16 2019 22:01:40 EST Ventricular Rate:  59 PR Interval:    QRS Duration: 100 QT Interval:  432 QTC Calculation: 428 R Axis:   65 Text Interpretation: Unknown rhythm, irregular rate Nonspecific T abnormalities, lateral leads Abnormal ECG Confirmed by Carmin Muskrat [phone]) on 06/16/2019 10:43:28 PM   Radiology Dg Chest 2 View  Result Date: 06/16/2019 CLINICAL DATA:  Chest pain. Additional history provided: Atrial fibrillation, numbness in right arm. EXAM: CHEST - 2 VIEW COMPARISON:  Chest radiograph 12/04/2015 FINDINGS: Stable, mild cardiomegaly. Prior median sternotomy. Aortic atherosclerosis. No airspace consolidation within the lungs. No evidence of pleural effusion or pneumothorax. No acute bony abnormality. Incompletely assessed cervical fusion hardware. IMPRESSION: Stable, mild cardiomegaly. Aortic atherosclerosis. No evidence of airspace consolidation within the lungs. Electronically Signed   By: Kellie Simmering DO   On: 06/16/2019 13:13   Ct  Head Wo Contrast  Result Date: 06/16/2019 CLINICAL DATA:  Ataxia, stroke suspected, focal neuro deficit greater than 6 hours EXAM: CT HEAD WITHOUT CONTRAST TECHNIQUE: Contiguous axial images were obtained from the base of the skull through the vertex without intravenous contrast. COMPARISON:  CT head 04/05/2013 FINDINGS: Brain: Stable appearance of a well-defined CSF filled space in the left cerebellum, possible remote lacune versus prominent perivascular space. No evidence of acute infarction, hemorrhage, hydrocephalus, extra-axial collection or mass lesion/mass effect. Symmetric prominence of the ventricles, cisterns and sulci compatible with mild parenchymal volume loss. Patchy areas of white matter hypoattenuation are most compatible with chronic microvascular angiopathy. Vascular: Atherosclerotic calcification of the carotid siphons. No hyperdense vessel. Skull: No calvarial fracture or suspicious osseous lesion. No scalp swelling or hematoma. Sinuses/Orbits: Paranasal sinuses and mastoid air cells are predominantly clear. Included orbital structures are unremarkable. Other: None IMPRESSION: 1. No acute intracranial abnormality. If persisting clinical concern for infarct, MRI is more sensitive and specific for subtle changes of ischemia. 2. Stable mild parenchymal volume loss and chronic microvascular angiopathy. 3. Stable region of gliosis versus prominent perivascular space in the left cerebellum. Electronically Signed   By: Lovena Le M.D.   On: 06/16/2019 20:26    Procedures Procedures (including critical care time)  Medications Ordered in ED Medications  sodium chloride flush (NS) 0.9 % injection 3 mL (has no administration in time range)     Initial Impression / Assessment and Plan / ED Course  I have reviewed the triage vital signs and the nursing notes.  Pertinent labs & imaging results that were available during my care of the patient were reviewed by me and considered in my medical  decision making (see chart for details).    After initial evaluation consideration of stroke versus neuropathy, I discussed MRI, stroke work-up with the patient.  Patient adverse to staying for full MRI, amenable to stroke evaluation, consideration as an outpatient. Patient amenable to CT scan here    9:43 PM Patient awake, alert, in no distress.   Discussed CT findings with him he now notes that his father died of GBM when he was age 35. Patient CT scan does not demonstrate mass,  nor hemorrhage, no evidence for stroke either, though it is notably a lower sensitivity test and MRI, which the patient is aware of purulent however, with generally reassuring findings, no ongoing complaints, the patient is amenable to outpatient evaluation, and he prefers it. Patient discharged in stable condition with outpatient follow-up for his numbness, episodic thready pulses.  Final Clinical Impressions(s) / ED Diagnoses   Final diagnoses:  Numbness     Carmin Muskrat, MD 06/16/19 2228    Carmin Muskrat, MD 06/16/19 2243

## 2019-06-16 NOTE — ED Notes (Signed)
Patient verbalizes understanding of discharge instructions. Opportunity for questioning and answers were provided. Armband removed by staff, pt discharged from ED.  

## 2019-06-16 NOTE — ED Notes (Signed)
Pt located in lobby after being unable to find by emt. EMT made aware so repeat vitals can be done and pt will go to next room.

## 2019-06-16 NOTE — ED Triage Notes (Signed)
Pt reports he has been having episodes on the morning when he wake sup where he feels like he goes into afib- pt states he pulses will be rapid and feels weak. Pt states this happened this morning but he also had right arm numbness that lasted about 30 minutes. Pt denies CP or sob. Hx of open heart sx.

## 2019-06-16 NOTE — ED Notes (Signed)
Pt states that he has been waking up with a thready irregular pulse that appears to be a-fib every single morning for 2 months and that it would become regular around 1000 every morning. Pt called Dr Tinnie Gens and informed him of this. He stated that he was told not to drink coffee. Pt stated that he only drank decaff but stopped it and it has continued. The pt dozed off this morning around 1000 this morning and woke up and could not feel his right hand up to his mid forearm. It took 10-15 minute and the feeling came back. Pt states that he may have been laying on it wrong but with his hx of a-fib and tia's he wanted to be sure. No sx at this time and none since he has been here.

## 2019-06-17 ENCOUNTER — Telehealth: Payer: Self-pay | Admitting: Family Medicine

## 2019-06-17 NOTE — Telephone Encounter (Signed)
Left detailed message on voicemail.  

## 2019-06-17 NOTE — Telephone Encounter (Signed)
See ER note.  Needs ER f/u when possible.  Thanks.

## 2019-06-20 ENCOUNTER — Other Ambulatory Visit: Payer: Self-pay

## 2019-06-20 ENCOUNTER — Encounter: Payer: Self-pay | Admitting: Family Medicine

## 2019-06-20 ENCOUNTER — Ambulatory Visit (INDEPENDENT_AMBULATORY_CARE_PROVIDER_SITE_OTHER): Payer: Medicare Other | Admitting: Family Medicine

## 2019-06-20 DIAGNOSIS — R202 Paresthesia of skin: Secondary | ICD-10-CM

## 2019-06-20 NOTE — Patient Instructions (Addendum)
I think the arm issue was a benign compressive issue.  Update me as needed. I wouldn't change your meds.   Take care.  Glad to see you.
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
## 2019-06-22 DIAGNOSIS — R202 Paresthesia of skin: Secondary | ICD-10-CM | POA: Insufficient documentation
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
## 2019-07-13 ENCOUNTER — Emergency Department (HOSPITAL_COMMUNITY): Payer: Medicare HMO

## 2019-07-13 ENCOUNTER — Encounter (HOSPITAL_COMMUNITY): Payer: Self-pay | Admitting: Emergency Medicine

## 2019-07-13 ENCOUNTER — Inpatient Hospital Stay (HOSPITAL_COMMUNITY)
Admission: EM | Admit: 2019-07-13 | Discharge: 2019-07-23 | DRG: 177 | Disposition: A | Discharge status: Home Health | Payer: Medicare HMO | Attending: Internal Medicine | Admitting: Internal Medicine

## 2019-07-13 ENCOUNTER — Other Ambulatory Visit: Payer: Self-pay

## 2019-07-13 DIAGNOSIS — J9601 Acute respiratory failure with hypoxia: Secondary | ICD-10-CM | POA: Diagnosis not present

## 2019-07-13 DIAGNOSIS — R0902 Hypoxemia: Secondary | ICD-10-CM | POA: Diagnosis not present

## 2019-07-13 DIAGNOSIS — I480 Paroxysmal atrial fibrillation: Secondary | ICD-10-CM | POA: Diagnosis present

## 2019-07-13 DIAGNOSIS — J069 Acute upper respiratory infection, unspecified: Secondary | ICD-10-CM | POA: Diagnosis not present

## 2019-07-13 DIAGNOSIS — Z808 Family history of malignant neoplasm of other organs or systems: Secondary | ICD-10-CM | POA: Diagnosis not present

## 2019-07-13 DIAGNOSIS — E785 Hyperlipidemia, unspecified: Secondary | ICD-10-CM | POA: Diagnosis not present

## 2019-07-13 DIAGNOSIS — I252 Old myocardial infarction: Secondary | ICD-10-CM | POA: Diagnosis not present

## 2019-07-13 DIAGNOSIS — E669 Obesity, unspecified: Secondary | ICD-10-CM | POA: Diagnosis present

## 2019-07-13 DIAGNOSIS — Z6830 Body mass index (BMI) 30.0-30.9, adult: Secondary | ICD-10-CM | POA: Diagnosis not present

## 2019-07-13 DIAGNOSIS — J1282 Pneumonia due to coronavirus disease 2019: Secondary | ICD-10-CM | POA: Diagnosis not present

## 2019-07-13 DIAGNOSIS — Z8673 Personal history of transient ischemic attack (TIA), and cerebral infarction without residual deficits: Secondary | ICD-10-CM | POA: Diagnosis not present

## 2019-07-13 DIAGNOSIS — U071 COVID-19: Secondary | ICD-10-CM

## 2019-07-13 DIAGNOSIS — M47812 Spondylosis without myelopathy or radiculopathy, cervical region: Secondary | ICD-10-CM | POA: Diagnosis present

## 2019-07-13 DIAGNOSIS — K921 Melena: Secondary | ICD-10-CM | POA: Diagnosis not present

## 2019-07-13 DIAGNOSIS — Z9114 Patient's other noncompliance with medication regimen: Secondary | ICD-10-CM

## 2019-07-13 DIAGNOSIS — D151 Benign neoplasm of heart: Secondary | ICD-10-CM | POA: Diagnosis not present

## 2019-07-13 DIAGNOSIS — I10 Essential (primary) hypertension: Secondary | ICD-10-CM | POA: Diagnosis not present

## 2019-07-13 DIAGNOSIS — Z7901 Long term (current) use of anticoagulants: Secondary | ICD-10-CM

## 2019-07-13 DIAGNOSIS — R0602 Shortness of breath: Secondary | ICD-10-CM | POA: Diagnosis not present

## 2019-07-13 DIAGNOSIS — R509 Fever, unspecified: Secondary | ICD-10-CM | POA: Diagnosis not present

## 2019-07-13 DIAGNOSIS — Z85820 Personal history of malignant melanoma of skin: Secondary | ICD-10-CM

## 2019-07-13 DIAGNOSIS — I2699 Other pulmonary embolism without acute cor pulmonale: Secondary | ICD-10-CM | POA: Diagnosis present

## 2019-07-13 DIAGNOSIS — R05 Cough: Secondary | ICD-10-CM | POA: Diagnosis not present

## 2019-07-13 DIAGNOSIS — E7800 Pure hypercholesterolemia, unspecified: Secondary | ICD-10-CM | POA: Diagnosis present

## 2019-07-13 DIAGNOSIS — Z800 Family history of malignant neoplasm of digestive organs: Secondary | ICD-10-CM | POA: Diagnosis not present

## 2019-07-13 DIAGNOSIS — Z87891 Personal history of nicotine dependence: Secondary | ICD-10-CM

## 2019-07-13 DIAGNOSIS — J159 Unspecified bacterial pneumonia: Secondary | ICD-10-CM | POA: Diagnosis present

## 2019-07-13 DIAGNOSIS — K649 Unspecified hemorrhoids: Secondary | ICD-10-CM | POA: Diagnosis present

## 2019-07-13 DIAGNOSIS — R531 Weakness: Secondary | ICD-10-CM | POA: Diagnosis not present

## 2019-07-13 DIAGNOSIS — Z209 Contact with and (suspected) exposure to unspecified communicable disease: Secondary | ICD-10-CM | POA: Diagnosis not present

## 2019-07-13 LAB — FERRITIN: Ferritin: 1363 ng/mL — ABNORMAL HIGH (ref 24–336)

## 2019-07-13 LAB — CBC WITH DIFFERENTIAL/PLATELET
Abs Immature Granulocytes: 0.05 10*3/uL (ref 0.00–0.07)
Basophils Absolute: 0 10*3/uL (ref 0.0–0.1)
Basophils Relative: 0 %
Eosinophils Absolute: 0 10*3/uL (ref 0.0–0.5)
Eosinophils Relative: 0 %
HCT: 36.2 % — ABNORMAL LOW (ref 39.0–52.0)
Hemoglobin: 12 g/dL — ABNORMAL LOW (ref 13.0–17.0)
Immature Granulocytes: 1 %
Lymphocytes Relative: 10 %
Lymphs Abs: 0.6 10*3/uL — ABNORMAL LOW (ref 0.7–4.0)
MCH: 31 pg (ref 26.0–34.0)
MCHC: 33.1 g/dL (ref 30.0–36.0)
MCV: 93.5 fL (ref 80.0–100.0)
Monocytes Absolute: 0.2 10*3/uL (ref 0.1–1.0)
Monocytes Relative: 4 %
Neutro Abs: 5.2 10*3/uL (ref 1.7–7.7)
Neutrophils Relative %: 85 %
Platelets: 141 10*3/uL — ABNORMAL LOW (ref 150–400)
RBC: 3.87 MIL/uL — ABNORMAL LOW (ref 4.22–5.81)
RDW: 14.1 % (ref 11.5–15.5)
WBC: 6.1 10*3/uL (ref 4.0–10.5)
nRBC: 0 % (ref 0.0–0.2)

## 2019-07-13 LAB — CBC
HCT: 36.4 % — ABNORMAL LOW (ref 39.0–52.0)
Hemoglobin: 12.2 g/dL — ABNORMAL LOW (ref 13.0–17.0)
MCH: 30.7 pg (ref 26.0–34.0)
MCHC: 33.5 g/dL (ref 30.0–36.0)
MCV: 91.5 fL (ref 80.0–100.0)
Platelets: 145 10*3/uL — ABNORMAL LOW (ref 150–400)
RBC: 3.98 MIL/uL — ABNORMAL LOW (ref 4.22–5.81)
RDW: 14 % (ref 11.5–15.5)
WBC: 6.4 10*3/uL (ref 4.0–10.5)
nRBC: 0 % (ref 0.0–0.2)

## 2019-07-13 LAB — BASIC METABOLIC PANEL
Anion gap: 15 (ref 5–15)
BUN: 30 mg/dL — ABNORMAL HIGH (ref 8–23)
CO2: 22 mmol/L (ref 22–32)
Calcium: 7.8 mg/dL — ABNORMAL LOW (ref 8.9–10.3)
Chloride: 94 mmol/L — ABNORMAL LOW (ref 98–111)
Creatinine, Ser: 1.41 mg/dL — ABNORMAL HIGH (ref 0.61–1.24)
GFR calc Af Amer: 56 mL/min — ABNORMAL LOW (ref >60)
GFR calc non Af Amer: 48 mL/min — ABNORMAL LOW (ref >60)
Glucose, Bld: 192 mg/dL — ABNORMAL HIGH (ref 70–99)
Potassium: 3 mmol/L — ABNORMAL LOW (ref 3.5–5.1)
Sodium: 131 mmol/L — ABNORMAL LOW (ref 135–145)

## 2019-07-13 LAB — HEPATIC FUNCTION PANEL
ALT: 104 U/L — ABNORMAL HIGH (ref 0–44)
AST: 120 U/L — ABNORMAL HIGH (ref 15–41)
Albumin: 2.4 g/dL — ABNORMAL LOW (ref 3.5–5.0)
Alkaline Phosphatase: 67 U/L (ref 38–126)
Bilirubin, Direct: 0.3 mg/dL — ABNORMAL HIGH (ref 0.0–0.2)
Indirect Bilirubin: 0.8 mg/dL (ref 0.3–0.9)
Total Bilirubin: 1.1 mg/dL (ref 0.3–1.2)
Total Protein: 6.8 g/dL (ref 6.5–8.1)

## 2019-07-13 LAB — POC SARS CORONAVIRUS 2 AG -  ED: SARS Coronavirus 2 Ag: NEGATIVE

## 2019-07-13 LAB — D-DIMER, QUANTITATIVE: D-Dimer, Quant: >20 ug/mL-FEU — ABNORMAL HIGH (ref 0.00–0.50)

## 2019-07-13 LAB — RESPIRATORY PANEL BY RT PCR (FLU A&B, COVID)
Influenza A by PCR: NEGATIVE
Influenza B by PCR: NEGATIVE
SARS Coronavirus 2 by RT PCR: POSITIVE — AB

## 2019-07-13 LAB — BRAIN NATRIURETIC PEPTIDE: B Natriuretic Peptide: 566.7 pg/mL — ABNORMAL HIGH (ref 0.0–100.0)

## 2019-07-13 LAB — PROCALCITONIN: Procalcitonin: 1.86 ng/mL

## 2019-07-13 LAB — TROPONIN I (HIGH SENSITIVITY)
Troponin I (High Sensitivity): 713 ng/L (ref <18)
Troponin I (High Sensitivity): 471 ng/L (ref <18)

## 2019-07-13 LAB — FIBRINOGEN: Fibrinogen: 724 mg/dL — ABNORMAL HIGH (ref 210–475)

## 2019-07-13 LAB — C-REACTIVE PROTEIN: CRP: 32.9 mg/dL — ABNORMAL HIGH (ref <1.0)

## 2019-07-13 LAB — LACTIC ACID, PLASMA: Lactic Acid, Venous: 3.1 mmol/L (ref 0.5–1.9)

## 2019-07-13 LAB — LACTATE DEHYDROGENASE: LDH: 609 U/L — ABNORMAL HIGH (ref 98–192)

## 2019-07-13 MED ORDER — SODIUM CHLORIDE 0.9 % IV SOLN
500.0000 mg | Freq: Once | INTRAVENOUS | Status: AC
  Administered 2019-07-13: 14:00:00 500 mg via INTRAVENOUS
  Filled 2019-07-13: qty 500

## 2019-07-13 MED ORDER — SODIUM CHLORIDE 0.9 % IV SOLN: 250.0000 mL | INTRAVENOUS | Status: DC | PRN

## 2019-07-13 MED ORDER — POTASSIUM CHLORIDE CRYS ER 20 MEQ PO TBCR
40.0000 meq | EXTENDED_RELEASE_TABLET | Freq: Once | ORAL | Status: AC
  Administered 2019-07-13: 13:00:00 40 meq via ORAL
  Filled 2019-07-13: qty 2

## 2019-07-13 MED ORDER — HEPARIN BOLUS VIA INFUSION
6900.0000 [IU] | Freq: Once | INTRAVENOUS | Status: AC
  Administered 2019-07-13: 6900 [IU] via INTRAVENOUS
  Filled 2019-07-13: qty 6900

## 2019-07-13 MED ORDER — LACTATED RINGERS IV BOLUS
1000.0000 mL | Freq: Once | INTRAVENOUS | Status: AC
  Administered 2019-07-13: 1000 mL via INTRAVENOUS

## 2019-07-13 MED ORDER — METOPROLOL TARTRATE 25 MG PO TABS
12.5000 mg | ORAL_TABLET | Freq: Two times a day (BID) | ORAL | Status: DC
  Administered 2019-07-13 – 2019-07-23 (×19): 12.5 mg via ORAL
  Filled 2019-07-13 (×20): qty 1

## 2019-07-13 MED ORDER — SODIUM CHLORIDE 0.9% FLUSH: 3.0000 mL | Freq: Once | INTRAVENOUS | Status: DC

## 2019-07-13 MED ORDER — IOHEXOL 350 MG/ML SOLN
100.0000 mL | Freq: Once | INTRAVENOUS | Status: AC | PRN
  Administered 2019-07-13: 87 mL via INTRAVENOUS

## 2019-07-13 MED ORDER — ONDANSETRON HCL 4 MG PO TABS: 4.0000 mg | ORAL_TABLET | Freq: Four times a day (QID) | ORAL | Status: DC | PRN

## 2019-07-13 MED ORDER — BISACODYL 5 MG PO TBEC: 5.0000 mg | DELAYED_RELEASE_TABLET | Freq: Every day | ORAL | Status: DC | PRN

## 2019-07-13 MED ORDER — ASPIRIN 81 MG PO CHEW
324.0000 mg | CHEWABLE_TABLET | Freq: Once | ORAL | Status: AC
  Administered 2019-07-13: 16:00:00 324 mg via ORAL
  Filled 2019-07-13: qty 4

## 2019-07-13 MED ORDER — SODIUM CHLORIDE 0.9 % IV SOLN
200.0000 mg | Freq: Once | INTRAVENOUS | Status: AC
  Administered 2019-07-13: 17:00:00 200 mg via INTRAVENOUS
  Filled 2019-07-13: qty 40

## 2019-07-13 MED ORDER — POLYETHYLENE GLYCOL 3350 17 G PO PACK
17.0000 g | PACK | Freq: Every day | ORAL | Status: DC | PRN
  Administered 2019-07-19: 17 g via ORAL
  Filled 2019-07-13: qty 1

## 2019-07-13 MED ORDER — DEXAMETHASONE SODIUM PHOSPHATE 10 MG/ML IJ SOLN: 6.0000 mg | INTRAMUSCULAR | Status: DC

## 2019-07-13 MED ORDER — ATORVASTATIN CALCIUM 40 MG PO TABS
40.0000 mg | ORAL_TABLET | Freq: Every day | ORAL | Status: DC
  Administered 2019-07-13 – 2019-07-16 (×4): 40 mg via ORAL
  Filled 2019-07-13 (×4): qty 1

## 2019-07-13 MED ORDER — SODIUM CHLORIDE 0.9% FLUSH: 3.0000 mL | INTRAVENOUS | Status: DC | PRN

## 2019-07-13 MED ORDER — HEPARIN (PORCINE) 25000 UT/250ML-% IV SOLN
1150.0000 [IU]/h | INTRAVENOUS | Status: DC
  Administered 2019-07-13: 1150 [IU]/h via INTRAVENOUS
  Filled 2019-07-13: qty 250

## 2019-07-13 MED ORDER — ALBUTEROL SULFATE HFA 108 (90 BASE) MCG/ACT IN AERS
2.0000 | INHALATION_SPRAY | RESPIRATORY_TRACT | Status: DC | PRN
  Filled 2019-07-13: qty 6.7

## 2019-07-13 MED ORDER — OXYCODONE HCL 5 MG PO TABS: 5.0000 mg | ORAL_TABLET | ORAL | Status: DC | PRN

## 2019-07-13 MED ORDER — SODIUM CHLORIDE 0.9 % IV SOLN
100.0000 mg | Freq: Every day | INTRAVENOUS | Status: AC
  Administered 2019-07-14 – 2019-07-17 (×4): 100 mg via INTRAVENOUS
  Filled 2019-07-13 (×3): qty 20

## 2019-07-13 MED ORDER — SODIUM CHLORIDE 0.9 % IV SOLN
1.0000 g | INTRAVENOUS | Status: AC
  Administered 2019-07-14 – 2019-07-18 (×5): 1 g via INTRAVENOUS
  Filled 2019-07-13 (×5): qty 10

## 2019-07-13 MED ORDER — GUAIFENESIN-DM 100-10 MG/5ML PO SYRP
10.0000 mL | ORAL_SOLUTION | ORAL | Status: DC | PRN
  Administered 2019-07-14: 10 mL via ORAL
  Filled 2019-07-13: qty 10

## 2019-07-13 MED ORDER — SODIUM CHLORIDE 0.9% FLUSH
3.0000 mL | Freq: Two times a day (BID) | INTRAVENOUS | Status: DC
  Administered 2019-07-14 – 2019-07-23 (×18): 3 mL via INTRAVENOUS

## 2019-07-13 MED ORDER — SODIUM CHLORIDE 0.9 % IV SOLN
1.0000 g | Freq: Once | INTRAVENOUS | Status: AC
  Administered 2019-07-13: 13:00:00 1 g via INTRAVENOUS
  Filled 2019-07-13: qty 10

## 2019-07-13 MED ORDER — HYDROCOD POLST-CPM POLST ER 10-8 MG/5ML PO SUER
5.0000 mL | Freq: Two times a day (BID) | ORAL | Status: DC | PRN
  Administered 2019-07-15: 5 mL via ORAL
  Filled 2019-07-13: qty 5

## 2019-07-13 MED ORDER — SODIUM CHLORIDE 0.9 % IV SOLN
500.0000 mg | INTRAVENOUS | Status: DC
  Administered 2019-07-14 – 2019-07-16 (×3): 500 mg via INTRAVENOUS
  Filled 2019-07-13 (×3): qty 500

## 2019-07-13 MED ORDER — ACETAMINOPHEN 325 MG PO TABS: 650.0000 mg | ORAL_TABLET | Freq: Four times a day (QID) | ORAL | Status: DC | PRN

## 2019-07-13 MED ORDER — SODIUM CHLORIDE 0.9% FLUSH
3.0000 mL | Freq: Two times a day (BID) | INTRAVENOUS | Status: DC
  Administered 2019-07-13 – 2019-07-23 (×19): 3 mL via INTRAVENOUS

## 2019-07-13 MED ORDER — ONDANSETRON HCL 4 MG/2ML IJ SOLN: 4.0000 mg | Freq: Four times a day (QID) | INTRAMUSCULAR | Status: DC | PRN

## 2019-07-13 MED ORDER — FLEET ENEMA 7-19 GM/118ML RE ENEM: 1.0000 | ENEMA | Freq: Once | RECTAL | Status: DC | PRN

## 2019-07-13 MED ORDER — DEXAMETHASONE SODIUM PHOSPHATE 10 MG/ML IJ SOLN
6.0000 mg | Freq: Once | INTRAMUSCULAR | Status: AC
  Administered 2019-07-13: 6 mg via INTRAVENOUS

## 2019-07-13 NOTE — ED Triage Notes (Signed)
Report called to [facility] on Pt and Paper work printed for transport.

## 2019-07-13 NOTE — H&P (Signed)
History and Physical    Matthew Sullivan [id number] DOB: 08/19/1945 DOA: 07/13/2019  PCP: Tonia Ghent, MD Consultants:  Nahser - cardiology; Carloyn Manner - neurosurgery Patient coming from:  Home - lives with wife; NOK: Wife, Yovanny Outley, [phone]; Son, Mali Lennox, [phone]   Chief Complaint:  SOB  HPI: Matthew Sullivan is a 74 y.o. male with medical history significant of HTN; HLD; and h/o atrial myxoma on Xarelto presenting with SOB.  He reports getting COVID from his wife and developing infection about 2 weeks ago.  Initially with fatigue and URI symptoms.  He has progressed over the past few days with increasing SOB.  Today, he knew he would die if he stayed home.  O2 sats with EMS were 53%.   ED Course:   COVID PNA and PE.  Acutely worse x 3 days.  O2 sat in 50s.  On 15L with O2 sats in mid-90s.  AG positive.  D-dimer 20, CRP 33.  Troponin 713, no chest pain.  CXR with bibasilar PNA - Rocephin, Azithro, Decadron.  Ordering Remdesivir.  CTA with small PE.  PCCM does not think he needs ICU.  Heparin drip.  Will admit to [facility].  Review of Systems: As per HPI; otherwise review of systems reviewed and negative.   Ambulatory Status:  Ambulates without assistance  Past Medical History:  Diagnosis Date  . Atrial myxoma   . Cervical osteoarthritis   . History of TIA (transient ischemic attack)   . History of tinnitus   . Hyperlipidemia   . Hypertension   . Melanoma ([place])    per Dr. Allyson Sabal, local excision, no chemo/no rady tx.     Past Surgical History:  Procedure Laterality Date  . ANKLE ARTHROSCOPY WITH ARTHRODESIS Left 08/26/2015   Procedure: LEFT ANKLE ARTHROSCOPY TIBIOTALAR ARTHRODESIS;  Surgeon: [name], MD;  Location: [place];  Service: Orthopedics;  Laterality: Left;  . APPENDECTOMY    . BACK SURGERY    . CARDIOVERSION N/A 06/25/2013   Procedure: CARDIOVERSION;  Surgeon: [name], MD;  Location: [facility] ENDOSCOPY;  Service:  Cardiovascular;  Laterality: N/A;  . EXCISION OF ATRIAL MYXOMA Left 04/09/2013   Procedure: EXCISION OF ATRIAL MYXOMA;  Surgeon: [name], MD;  Location: [place];  Service: Open Heart Surgery;  Laterality: Left;  . INTRAOPERATIVE TRANSESOPHAGEAL ECHOCARDIOGRAM N/A 04/09/2013   Procedure: INTRAOPERATIVE TRANSESOPHAGEAL ECHOCARDIOGRAM;  Surgeon: [name], MD;  Location: [place];  Service: Open Heart Surgery;  Laterality: N/A;  . LEFT HEART CATHETERIZATION WITH CORONARY ANGIOGRAM N/A 04/07/2013   Procedure: LEFT HEART CATHETERIZATION WITH CORONARY ANGIOGRAM;  Surgeon: [name], MD;  Location: [facility] CATH LAB;  Service: Cardiovascular;  Laterality: N/A;  . NASAL SEPTUM SURGERY    . NOSE SURGERY    . ROTATOR CUFF REPAIR     both shoulders    Social History   Socioeconomic History  . Marital status: Married    Spouse name: Not on file  . Number of children: Not on file  . Years of education: Not on file  . Highest education level: Not on file  Occupational History  . Not on file  Tobacco Use  . Smoking status: Former Smoker    Quit date: 07/10/1977    Years since quitting: 42.0  . Smokeless tobacco: Never Used  Substance and Sexual Activity  . Alcohol use: Yes    Alcohol/week: 2.0 standard drinks    Types: 1 Glasses of wine, 1 Cans of  beer per week    Comment: rare  . Drug use: No  . Sexual activity: Not on file  Other Topics Concern  . 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     Social Determinants of Health   Financial Resource Strain:   . Difficulty of Paying Living Expenses: Not on file  Food Insecurity:   . Worried About Charity fundraiser in the Last Year: Not on file  . Ran Out of Food in the Last Year: Not on file  Transportation Needs:   . Lack of Transportation (Medical): Not on file  . Lack of Transportation (Non-Medical): Not on file  Physical Activity:   . Days of Exercise per Week: Not on  file  . Minutes of Exercise per Session: Not on file  Stress:   . Feeling of Stress : Not on file  Social Connections:   . Frequency of Communication with Friends and Family: Not on file  . Frequency of Social Gatherings with Friends and Family: Not on file  . Attends Religious Services: Not on file  . Active Member of Clubs or Organizations: Not on file  . Attends Archivist Meetings: Not on file  . Marital Status: Not on file  Intimate Partner Violence:   . Fear of Current or Ex-Partner: Not on file  . Emotionally Abused: Not on file  . Physically Abused: Not on file  . Sexually Abused: Not on file    No Known Allergies  Family History  Problem Relation Age of Onset  . Colon cancer Mother   . Liver cancer Mother   . Cancer Father   . Brain cancer Father        glioblastoma  . Prostate cancer Neg Hx     Prior to Admission medications   Medication Sig Start Date End Date Taking? Authorizing Provider  atorvastatin (LIPITOR) 80 MG tablet TAKE 1/2 TABLET BY MOUTH AT BEDTIME. Patient taking differently: Take 40 mg by mouth at bedtime.  12/11/18  Yes Nahser, Wonda Cheng, MD  hydrochlorothiazide (HYDRODIURIL) 25 MG tablet TAKE 1 TABLET (25 MG TOTAL) BY MOUTH DAILY. PLEASE MAKE YEARLY APPT WITH DR. Acie Fredrickson FOR FEBRUARY BEFORE ANYMORE REFILLS. 1ST ATTEMPT 10/17/18  Yes Nahser, Wonda Cheng, MD  metoprolol tartrate (LOPRESSOR) 25 MG tablet TAKE 1/2 TABLET BY MOUTH 2 TIMES DAILY. Patient taking differently: Take 12.5 mg by mouth 2 (two) times daily.  10/17/18  Yes Nahser, Wonda Cheng, MD  potassium chloride SA (KLOR-CON) 20 MEQ tablet TAKE 1 TABLET BY MOUTH 2 TIMES DAILY. Patient taking differently: Take 20 mEq by mouth 2 (two) times daily.  05/08/19  Yes Nahser, Wonda Cheng, MD  XARELTO 20 MG TABS tablet TAKE 1 TABLET BY MOUTH DAILY WITH SUPPER. OVERDUE FOR FOLLOW UP Patient taking differently: Take 20 mg by mouth daily with supper.  05/08/19  Yes Nahser, Wonda Cheng, MD    Physical  Exam: Vitals:   07/13/19 1715 07/13/19 1729 07/13/19 1730 07/13/19 1800  BP: 113/82  119/74 127/77  Pulse: 78  79 79  Resp: (!) 23  (!) 24 20  Temp:      TempSrc:      SpO2: 96% 94% 94% 95%  Weight:      Height:         . General:  Appears calm and comfortable and is NAD with NRB in place - O2 sats plummet when removed . Eyes:  PERRL,  EOMI, normal lids, iris . ENT:  grossly normal hearing, lips & tongue, mmm . Neck:  no LAD, masses or thyromegaly . Cardiovascular:  RRR, no m/r/g. No LE edema.  Marland Kitchen Respiratory:   Scattered rhonchi.  Mildly increased respiratory effort, surprisingly reassuring given his labs . Abdomen:  soft, NT, ND, NABS . Skin:  no rash or induration seen on limited exam . Musculoskeletal:  grossly normal tone BUE/BLE, good ROM, no bony abnormality . Psychiatric:  grossly normal mood and affect, speech fluent and appropriate, AOx3 . Neurologic:  CN 2-12 grossly intact, moves all extremities in coordinated fashion, sensation intact    Radiological Exams on Admission: CT Angio Chest PE W and/or Wo Contrast  Result Date: 07/13/2019 CLINICAL DATA:  Shortness of breath. Productive cough. EXAM: CT ANGIOGRAPHY CHEST WITH CONTRAST TECHNIQUE: Multidetector CT imaging of the chest was performed using the standard protocol during bolus administration of intravenous contrast. Multiplanar CT image reconstructions and MIPs were obtained to evaluate the vascular anatomy. CONTRAST:  38mL OMNIPAQUE IOHEXOL 350 MG/ML SOLN COMPARISON:  Radiograph of same day. FINDINGS: Cardiovascular: Small filling defect is seen in lower lobe branch of right pulmonary artery consistent with small pulmonary embolus. Small cardiomegaly is noted. No pericardial effusion is noted. Atherosclerosis of thoracic aorta is noted without aneurysm formation. Mediastinum/Nodes: No enlarged mediastinal, hilar, or axillary lymph nodes. Thyroid gland, trachea, and esophagus demonstrate no significant findings.  Lungs/Pleura: No pneumothorax or pleural effusion is noted. Large bilateral airspace opacities are noted concerning for multifocal pneumonia. Upper Abdomen: No acute abnormality. Musculoskeletal: Sternotomy wires are noted. No acute osseous abnormality is noted. Review of the MIP images confirms the above findings. IMPRESSION: 1. Small filling defect is seen in lower lobe branch of right pulmonary artery consistent with small pulmonary embolus. Critical Value/emergent results were called by telephone at the time of interpretation on 07/13/2019 at 3:21 pm to [place] , who verbally acknowledged these results. 2. Small cardiomegaly. 3. Large bilateral airspace opacities are noted concerning for multifocal pneumonia. 4. Aortic atherosclerosis. Aortic Atherosclerosis (ICD10-I70.0). Electronically Signed   By: Marijo Conception M.D.   On: 07/13/2019 15:22   DG Chest Portable 1 View  Result Date: 07/13/2019 CLINICAL DATA:  Hypoxemia. Likely COVID. Additional history provided: Patient's wife COVID positive. Generalized weakness, productive cough with yellowish green phlegm and fever for 2 weeks. EXAM: PORTABLE CHEST 1 VIEW COMPARISON:  Chest radiograph 06/16/2019 FINDINGS: Mild cardiomegaly, unchanged. Prior median sternotomy. Aortic atherosclerosis. Interval development of bilateral ill-defined airspace opacities with a right upper lobe predominance on the right, and upper to mid lung field predominance on the left. No evidence of pleural effusion or pneumothorax. No acute bony abnormality. Partially visualized cervical ACDF hardware. IMPRESSION: Interval development of bilateral airspace opacities consistent with multifocal pneumonia. Cardiomegaly, unchanged Electronically Signed   By: Kellie Simmering DO   On: 07/13/2019 11:13    EKG: Independently reviewed.  NSR with rate 91; nonspecific ST changes with no evidence of acute ischemia   Labs on Admission: I have personally reviewed the available labs and  imaging studies at the time of the admission.  Pertinent labs:   Na++ 131 K+ 3.0 CO2 22 Glucose 192 BUN 30/Creatinine 1.41/GFR 48 Albumin 2.4 AST 120/ALT 104 BNP 566.7 LDH 609 Troponin 713 Ferritin 1363 CRP 32.9 Lactate 3.1 Procalcitonin 1.86 WBC 6.1 Hgb 12.0 Platelets 141 D-dimer >20 Fibrinogen 724 COVID POSITIVE Blood cultures pending   Assessment/Plan Principal Problem:   Acute respiratory disease due to COVID-19 virus Active Problems:  Pure hypercholesterolemia   HTN (hypertension)   Myxoma of heart   PAF (paroxysmal atrial fibrillation) (HCC)   Current use of long term anticoagulation   Pulmonary embolism (HCC)     Acute respiratory failure with hypoxia associated with COVID-19 PNA and PE (see below) -Patient with presenting with progressive SOB, marked hypoxia that is quickly recurrent when not on NRB -COVID POSITIVE -The patient has comorbidities which may increase the risk for ARDS/MODS including: age, HTN -Exam is concerning for development of ARDS/MODS due to respiratory distress -Pertinent labs concerning for COVID include lymphopenia; increased BUN/Creatinine; increased LFTs; markedly elevated D-dimer (>20!!!!); markedly elevated CRP (32.9!!); elevated troponin; increased LDH; increased ferritin; increased fibrinogen -CXR with multifocal opacities which may be c/w COVID vs. Multifocal PNA -Will treat with broad-spectrum antibiotics given procalcitonin >0.1.   -Will admit to [facility] for further evaluation, close monitoring, and treatment -Patient was reviewed by PCCM and felt not to need ICU admission at this time; this seems reasonable given his overall clinical stability while on O2 -At this time, will attempt to avoid use of aerosolized medications and use HFAs instead -Will check daily labs including BMP with Mag, Phos; LFTs; CBC with differential; CRP (q12h); ferritin; fibrinogen; D-dimer (q12h) -Consider Echo  -Will order  steroids (1 mg/kg divided BID) and Remdesivir (pharmacy consult) given +COVID test, +CXR, and hypoxia <94% on room air -If the patient shows clinical deterioration, consider transfer to ICU with PCCM consultation -If the patient is hypoxic or on >3L oxygen from baseline or CRP >7, considerTocilizumab 8 mg/kg x1 IF + COVID test; O2 sats <88% on room air; and patient is at high risk for intubation.  Patient has been counseled but has mildly elevated LFTs and has current clinical stability; suggest giving if patient worsens. -Will attempt to maintain euvolemia to a net negative fluid status -Will ask the patient to maintain an awake prone position for 16+ hours a day, if possible, with a minimum of 2-3 hours at a time -Patient was seen wearing full PPE including: gown, gloves, head cover, N95, and face shield; donning and doffing was in compliance with current standards.  PE -Patient without prior h/o VTE but now with apparent PE -This developed while on Xarelto, but likely does not constitute treatment failure since he missed a couple of doses -Instead, he may have been hypercoagulable because of his missed doses and in conjunction with his COVID-19 infection -For now will treat with Heparin drip -It may be reasonable to resume Xarelto once he is more clinically stable - although obviously future treatment failure would clearly indicate that this medication is insufficient for him  HTN -Hold HCTZ for now in the setting of renal dysfunction, hypokalemia -Continue Lopressor  HLD -Continue high-dose Liptior  H/o atrial myxoma/a fib -Patient had a myxoma removed in 2014 and brief period of post-op afib that resolved after electrical cardioversion later that year -He is on Xarelto for this reason   Total critical care time: 65 minutes Critical care time was exclusive of separately billable procedures and treating other patients. Critical care was necessary to treat or prevent imminent or  life-threatening deterioration. Critical care was time spent personally by me on the following activities: development of treatment plan with patient and/or surrogate as well as nursing, discussions with consultants, evaluation of patient's response to treatment, examination of patient, obtaining history from patient or surrogate, ordering and performing treatments and interventions, ordering and review of laboratory studies, ordering and review of radiographic  studies, pulse oximetry and re-evaluation of patient's condition.    DVT prophylaxis:  Heparin Code Status:  Full - confirmed with patient/family Family Communication: None present; I spoke with the patient's wife and then son by telephone. Disposition Plan:  Home once clinically improved Consults called: PCCM by telephone only Admission status: Admit - It is my clinical opinion that admission to INPATIENT is reasonable and necessary because of the expectation that this patient will require hospital care that crosses at least 2 midnights to treat this condition based on the medical complexity of the problems presented.  Given the aforementioned information, the predictability of an adverse outcome is felt to be significant.    Karmen Bongo MD [facility]   How to contact the [facility] Attending or Consulting provider [place] or covering provider during after hours [place], for this patient?  1. Check the care team in [facility] and look for a) attending/consulting [facility] provider listed and b) the [facility] team listed 2. Log into [email or web address] and use [facility]'s universal password to access. If you do not have the password, please contact the hospital operator. 3. Locate the [facility] provider you are looking for under [facility] and page to a number that you can be directly reached. 4. If you still have difficulty reaching the provider, please page the [facility] (Director on Call) for the Hospitalists listed on amion for assistance.   07/13/2019,  6:51 PM

## 2019-07-13 NOTE — ED Triage Notes (Signed)
Pt to triage via [facility] from home.  Pt's wife COVID +.  Pt has not been tested.  C/o SOB, generalized weakness, productive cough with yellowish-green phlegm, and fever x 2 weeks.  Room air O2 sats was high 60% per EMS.  97% on 12 liters by EMS.  Pt denies pain.

## 2019-07-13 NOTE — Progress Notes (Signed)
ANTICOAGULATION CONSULT NOTE - Initial Consult  Pharmacy Consult for Heparin Indication: pulmonary embolus  No Known Allergies  Patient Measurements: Height: 6' (182.9 cm) Weight: 224 lb 13.9 oz (102 kg) IBW/kg (Calculated) : 77.6 Heparin Dosing Weight: 98.5 kg  Vital Signs: Temp: 97.8 F (36.6 C) (01/03 1010) Temp Source: Oral (01/03 1010) BP: 110/89 (01/03 1430) Pulse Rate: 73 (01/03 1430)  Labs: Recent Labs    07/13/19 1021 07/13/19 1119  HGB 12.2* 12.0*  HCT 36.4* 36.2*  PLT 145* 141*  CREATININE 1.41*  --   TROPONINIHS  --  713*    Estimated Creatinine Clearance: 56 mL/min (A) (by C-G formula based on SCr of 1.41 mg/dL (H)).   Medical History: Past Medical History:  Diagnosis Date  . Atrial myxoma   . Cervical osteoarthritis   . History of TIA (transient ischemic attack)   . History of tinnitus   . Hyperlipidemia   . Hypertension   . Melanoma ([place])    per Dr. Allyson Sabal, local excision, no chemo/no rady tx.     Medications:  Scheduled:  . aspirin  324 mg Oral Once  . sodium chloride flush  3 mL Intravenous Once    Assessment: Patient is a 27 yom that presents to the ED with increased SOB. The patient's wife has recently been diagnosed with COVID. Patients COVID test was positive here and was found to have a PE. Pharmacy has been asked to dose heparin at this time for this patients PE.   Goal of Therapy:  Heparin level 0.3-0.7 units/ml Monitor platelets by anticoagulation protocol: Yes   Plan:  - Heparin bolus of 6900 units  - Heparin drip @ 1150 units/hr - Heparin level in 6 hours  - Monitor patient for s/s of bleeding and CBC while on heparin   Duanne Limerick PharmD. BCPS 07/13/2019,3:35 PM

## 2019-07-13 NOTE — ED Notes (Signed)
[facility](Transfer to [facility]) called @1713-per Jamie, RN called by Adrieana Fennelly 

## 2019-07-13 NOTE — ED Provider Notes (Signed)
Admission discussed with Dr. Lorin Mercy of hospitalist service.  Patient remained stable nonrebreather.  He is receiving antibiotics and Decadron.  Will add remdesivir.   Ezequiel Essex, MD 07/13/19 [phone]

## 2019-07-13 NOTE — ED Provider Notes (Signed)
[facility] EMERGENCY DEPARTMENT Provider Note   CSN: [id number] Arrival date & time: 07/13/19  [id number]     History Chief Complaint  Patient presents with  . ? COVID  . Shortness of Breath    Matthew Sullivan is a 74 y.o. male.  HPI    74 year old male with extreme fatigue and dyspnea.  Patient reports that he lives at home with his wife who is Covid positive.  He himself has not been tested though.  He reports that he has been feeling poorly over the last 2 weeks.  It sniffily worsened in the past 3 days.  Extremely short of breath.  No energy.  No appetite.  Occasionally coughing. His oxygen saturations were in the 50s on room air and he was cyanotic upon arrival.  He was placed on a nonrebreather and is now talking and joking although obviously looks ill.  Denies any acute pain.  No vomiting or diarrhea.   Past Medical History:  Diagnosis Date  . Atrial myxoma   . Cervical osteoarthritis   . History of TIA (transient ischemic attack)   . History of tinnitus   . Hyperlipidemia   . Hypertension   . Melanoma ([place])    per Dr. Allyson Sabal, local excision, no chemo/no rady tx.     Patient Active Problem List   Diagnosis Date Noted  . Paresthesia 06/22/2019  . Health care maintenance 07/22/2017  . Advance care planning 07/22/2017  . Current use of long term anticoagulation 01/23/2017  . Shingles 11/13/2016  . Colon cancer screening 07/20/2016  . PAF (paroxysmal atrial fibrillation) ([place]) 04/29/2013  . Myxoma of heart 04/07/2013  . NSTEMI  04/06/2013  . HTN (hypertension) 04/05/2013  . Syncope 04/05/2013  . Benign hypertensive heart disease without heart failure 06/22/2011  . Pure hypercholesterolemia 06/22/2011    Past Surgical History:  Procedure Laterality Date  . ANKLE ARTHROSCOPY WITH ARTHRODESIS Left 08/26/2015   Procedure: LEFT ANKLE ARTHROSCOPY TIBIOTALAR ARTHRODESIS;  Surgeon: [name], MD;  Location: [place];  Service:  Orthopedics;  Laterality: Left;  . APPENDECTOMY    . BACK SURGERY    . CARDIOVERSION N/A 06/25/2013   Procedure: CARDIOVERSION;  Surgeon: [name], MD;  Location: [facility] ENDOSCOPY;  Service: Cardiovascular;  Laterality: N/A;  . EXCISION OF ATRIAL MYXOMA Left 04/09/2013   Procedure: EXCISION OF ATRIAL MYXOMA;  Surgeon: [name], MD;  Location: [place];  Service: Open Heart Surgery;  Laterality: Left;  . INTRAOPERATIVE TRANSESOPHAGEAL ECHOCARDIOGRAM N/A 04/09/2013   Procedure: INTRAOPERATIVE TRANSESOPHAGEAL ECHOCARDIOGRAM;  Surgeon: [name], MD;  Location: [place];  Service: Open Heart Surgery;  Laterality: N/A;  . LEFT HEART CATHETERIZATION WITH CORONARY ANGIOGRAM N/A 04/07/2013   Procedure: LEFT HEART CATHETERIZATION WITH CORONARY ANGIOGRAM;  Surgeon: [name], MD;  Location: [facility] CATH LAB;  Service: Cardiovascular;  Laterality: N/A;  . NASAL SEPTUM SURGERY    . NOSE SURGERY    . ROTATOR CUFF REPAIR     both shoulders       Family History  Problem Relation Age of Onset  . Colon cancer Mother   . Liver cancer Mother   . Cancer Father   . Brain cancer Father        glioblastoma  . Prostate cancer Neg Hx     Social History   Tobacco Use  . Smoking status: Former Smoker    Quit date: 07/10/1977    Years since quitting: 42.0  . Smokeless tobacco:  Never Used  Substance Use Topics  . Alcohol use: Yes    Alcohol/week: 2.0 standard drinks    Types: 1 Glasses of wine, 1 Cans of beer per week    Comment: rare  . Drug use: No    Home Medications Prior to Admission medications   Medication Sig Start Date End Date Taking? Authorizing Provider  atorvastatin (LIPITOR) 80 MG tablet TAKE 1/2 TABLET BY MOUTH AT BEDTIME. 12/11/18   Nahser, Wonda Cheng, MD  hydrochlorothiazide (HYDRODIURIL) 25 MG tablet TAKE 1 TABLET (25 MG TOTAL) BY MOUTH DAILY. PLEASE MAKE YEARLY APPT WITH DR. Acie Fredrickson FOR FEBRUARY BEFORE ANYMORE REFILLS. 1ST ATTEMPT 10/17/18   Nahser, Wonda Cheng, MD   metoprolol tartrate (LOPRESSOR) 25 MG tablet TAKE 1/2 TABLET BY MOUTH 2 TIMES DAILY. 10/17/18   Nahser, Wonda Cheng, MD  potassium chloride SA (KLOR-CON) 20 MEQ tablet TAKE 1 TABLET BY MOUTH 2 TIMES DAILY. 05/08/19   Nahser, Wonda Cheng, MD  XARELTO 20 MG TABS tablet TAKE 1 TABLET BY MOUTH DAILY WITH SUPPER. OVERDUE FOR FOLLOW UP 05/08/19   Nahser, Wonda Cheng, MD    Allergies    Patient has no known allergies.  Review of Systems   Review of Systems All systems reviewed and negative, other than as noted in HPI.  Physical Exam Updated Vital Signs BP (!) 91/47 (BP Location: Left Arm)   Pulse 90   Temp 97.8 F (36.6 C) (Oral)   Resp (!) 22   SpO2 95%   Physical Exam Vitals and nursing note reviewed.  Constitutional:      General: He is in acute distress.     Appearance: He is well-developed. He is ill-appearing.  HENT:     Head: Normocephalic and atraumatic.  Eyes:     General:        Right eye: No discharge.        Left eye: No discharge.     Conjunctiva/sclera: Conjunctivae normal.  Cardiovascular:     Rate and Rhythm: Normal rate and regular rhythm.     Heart sounds: Normal heart sounds. No murmur. No friction rub. No gallop.   Pulmonary:     Effort: Respiratory distress present.     Breath sounds: Rhonchi present.  Abdominal:     General: There is no distension.     Palpations: Abdomen is soft.     Tenderness: There is no abdominal tenderness.  Musculoskeletal:        General: No tenderness.     Cervical back: Neck supple.  Skin:    General: Skin is warm and dry.  Neurological:     Mental Status: He is alert.  Psychiatric:        Behavior: Behavior normal.        Thought Content: Thought content normal.     ED Results / Procedures / Treatments   Labs (all labs ordered are listed, but only abnormal results are displayed) Labs Reviewed  RESPIRATORY PANEL BY RT PCR (FLU A&B, COVID) - Abnormal; Notable for the following components:      Result Value   SARS  Coronavirus 2 by RT PCR POSITIVE (*)    All other components within normal limits  BASIC METABOLIC PANEL - Abnormal; Notable for the following components:   Sodium 131 (*)    Potassium 3.0 (*)    Chloride 94 (*)    Glucose, Bld 192 (*)    BUN 30 (*)    Creatinine, Ser 1.41 (*)    Calcium 7.8 (*)  GFR calc non Af Amer 48 (*)    GFR calc Af Amer 56 (*)    All other components within normal limits  CBC - Abnormal; Notable for the following components:   RBC 3.98 (*)    Hemoglobin 12.2 (*)    HCT 36.4 (*)    Platelets 145 (*)    All other components within normal limits  FERRITIN - Abnormal; Notable for the following components:   Ferritin 1,363 (*)    All other components within normal limits  C-REACTIVE PROTEIN - Abnormal; Notable for the following components:   CRP 32.9 (*)    All other components within normal limits  CBC WITH DIFFERENTIAL/PLATELET - Abnormal; Notable for the following components:   RBC 3.87 (*)    Hemoglobin 12.0 (*)    HCT 36.2 (*)    Platelets 141 (*)    Lymphs Abs 0.6 (*)    All other components within normal limits  D-DIMER, QUANTITATIVE (NOT AT [facility]) - Abnormal; Notable for the following components:   D-Dimer, Quant >20.00 (*)    All other components within normal limits  FIBRINOGEN - Abnormal; Notable for the following components:   Fibrinogen 724 (*)    All other components within normal limits  LACTATE DEHYDROGENASE - Abnormal; Notable for the following components:   LDH 609 (*)    All other components within normal limits  HEPATIC FUNCTION PANEL - Abnormal; Notable for the following components:   Albumin 2.4 (*)    AST 120 (*)    ALT 104 (*)    Bilirubin, Direct 0.3 (*)    All other components within normal limits  LACTIC ACID, PLASMA - Abnormal; Notable for the following components:   Lactic Acid, Venous 3.1 (*)    All other components within normal limits  BRAIN NATRIURETIC PEPTIDE - Abnormal; Notable for the following components:   B  Natriuretic Peptide 566.7 (*)    All other components within normal limits  TROPONIN I (HIGH SENSITIVITY) - Abnormal; Notable for the following components:   Troponin I (High Sensitivity) 713 (*)    All other components within normal limits  CULTURE, BLOOD (ROUTINE X 2)  CULTURE, BLOOD (ROUTINE X 2)  PROCALCITONIN  URINALYSIS, ROUTINE W REFLEX MICROSCOPIC  LACTIC ACID, PLASMA  POC SARS CORONAVIRUS 2 AG -  ED  TROPONIN I (HIGH SENSITIVITY)    EKG EKG Interpretation  Date/Time:  Sunday July 13 2019 10:12:53 EST Ventricular Rate:  91 PR Interval:  142 QRS Duration: 88 QT Interval:  384 QTC Calculation: 472 R Axis:   35 Text Interpretation: Normal sinus rhythm Cannot rule out Anterior infarct , age undetermined ST & T wave abnormality, consider lateral ischemia Abnormal ECG Confirmed by Virgel Manifold [phone]) on 07/13/2019 10:58:43 AM   Radiology DG Chest Portable 1 View  Result Date: 07/13/2019 CLINICAL DATA:  Hypoxemia. Likely COVID. Additional history provided: Patient's wife COVID positive. Generalized weakness, productive cough with yellowish green phlegm and fever for 2 weeks. EXAM: PORTABLE CHEST 1 VIEW COMPARISON:  Chest radiograph 06/16/2019 FINDINGS: Mild cardiomegaly, unchanged. Prior median sternotomy. Aortic atherosclerosis. Interval development of bilateral ill-defined airspace opacities with a right upper lobe predominance on the right, and upper to mid lung field predominance on the left. No evidence of pleural effusion or pneumothorax. No acute bony abnormality. Partially visualized cervical ACDF hardware. IMPRESSION: Interval development of bilateral airspace opacities consistent with multifocal pneumonia. Cardiomegaly, unchanged Electronically Signed   By: Kellie Simmering DO   On: 07/13/2019 11:13  Procedures Procedures (including critical care time)  CRITICAL CARE Performed by: Virgel Manifold Total critical care time: 35 minutes Critical care time was exclusive of  separately billable procedures and treating other patients. Critical care was necessary to treat or prevent imminent or life-threatening deterioration. Critical care was time spent personally by me on the following activities: development of treatment plan with patient and/or surrogate as well as nursing, discussions with consultants, evaluation of patient's response to treatment, examination of patient, obtaining history from patient or surrogate, ordering and performing treatments and interventions, ordering and review of laboratory studies, ordering and review of radiographic studies, pulse oximetry and re-evaluation of patient's condition.   Medications Ordered in ED Medications  sodium chloride flush (NS) 0.9 % injection 3 mL (has no administration in time range)    ED Course  I have reviewed the triage vital signs and the nursing notes.  Pertinent labs & imaging results that were available during my care of the patient were reviewed by me and considered in my medical decision making (see chart for details).    MDM Rules/Calculators/A&P                      73yM with respiratory failure. High suspicion for COVID  Wife COVID+ but initial rapid COVID is negative and CXR more typical pattern for bacterial pneumonia. Rocephin/azithromycin ordered for now. Will repeat with more sensitive COVID assay. He is protecting his airway but requiring significant supplemental oxygen. Troponin is elevated but he denies pain. EKG appears similar to from last month. Could very well be demand ischemia in someone with known heart disease and severely hypoxic. ASA given. Heparin deferred for the time being. Trend enzymes. Initial BP soft and lactic acid elevated. IVF.   More sensitive test did subsequently come back positive for COVID.   Matthew Sullivan was evaluated in Emergency Department on 07/13/2019 for the symptoms described in the history of present illness. He was evaluated in the context of the global  COVID-19 pandemic, which necessitated consideration that the patient might be at risk for infection with the SARS-CoV-2 virus that causes COVID-19. Institutional protocols and algorithms that pertain to the evaluation of patients at risk for COVID-19 are in a state of rapid change based on information released by regulatory bodies including the CDC and federal and state organizations. These policies and algorithms were followed during the patient's care in the ED.  Final Clinical Impression(s) / ED Diagnoses Final diagnoses:  Acute respiratory failure with hypoxia ([employer])  COVID-19 virus infection  Acute pulmonary embolism without acute cor pulmonale, unspecified pulmonary embolism type [facility])    Rx / DC Orders ED Discharge Orders    None       Virgel Manifold, MD 07/13/19 1523

## 2019-07-13 NOTE — ED Notes (Signed)
Called [facility] to inform them that the pt is in route

## 2019-07-13 NOTE — ED Notes (Signed)
Introduced self to patient and asked if he needed anything. Informed that we were still waiting on transport

## 2019-07-13 NOTE — ED Triage Notes (Signed)
Call from [facility] Pt tested postive for COVID-19

## 2019-07-13 NOTE — ED Notes (Signed)
Extra blood tubes collected and sent down to main lab -- (2) Gold top, light green, lavender, dark green and light blue all sent to main lab.

## 2019-07-13 NOTE — ED Notes (Signed)
Pt disconnect Non-rebreather mask by accident and SPO2 drop to 59%. Pt was connected again and his SPO2 is 94%

## 2019-07-14 LAB — COMPREHENSIVE METABOLIC PANEL
ALT: 128 U/L — ABNORMAL HIGH (ref 0–44)
AST: 111 U/L — ABNORMAL HIGH (ref 15–41)
Albumin: 2.6 g/dL — ABNORMAL LOW (ref 3.5–5.0)
Alkaline Phosphatase: 81 U/L (ref 38–126)
Anion gap: 12 (ref 5–15)
BUN: 29 mg/dL — ABNORMAL HIGH (ref 8–23)
CO2: 29 mmol/L (ref 22–32)
Calcium: 8.1 mg/dL — ABNORMAL LOW (ref 8.9–10.3)
Chloride: 99 mmol/L (ref 98–111)
Creatinine, Ser: 1.06 mg/dL (ref 0.61–1.24)
GFR calc Af Amer: >60 mL/min (ref >60)
GFR calc non Af Amer: >60 mL/min (ref >60)
Glucose, Bld: 178 mg/dL — ABNORMAL HIGH (ref 70–99)
Potassium: 3.3 mmol/L — ABNORMAL LOW (ref 3.5–5.1)
Sodium: 140 mmol/L (ref 135–145)
Total Bilirubin: 0.7 mg/dL (ref 0.3–1.2)
Total Protein: 6.9 g/dL (ref 6.5–8.1)

## 2019-07-14 LAB — CBC
HCT: 37.3 % — ABNORMAL LOW (ref 39.0–52.0)
Hemoglobin: 12 g/dL — ABNORMAL LOW (ref 13.0–17.0)
MCH: 30 pg (ref 26.0–34.0)
MCHC: 32.2 g/dL (ref 30.0–36.0)
MCV: 93.3 fL (ref 80.0–100.0)
Platelets: 160 10*3/uL (ref 150–400)
RBC: 4 MIL/uL — ABNORMAL LOW (ref 4.22–5.81)
RDW: 14 % (ref 11.5–15.5)
WBC: 10.6 10*3/uL — ABNORMAL HIGH (ref 4.0–10.5)
nRBC: 0 % (ref 0.0–0.2)

## 2019-07-14 LAB — HEPARIN LEVEL (UNFRACTIONATED)
Heparin Unfractionated: 0.48 IU/mL (ref 0.30–0.70)
Heparin Unfractionated: 0.14 IU/mL — ABNORMAL LOW (ref 0.30–0.70)

## 2019-07-14 LAB — D-DIMER, QUANTITATIVE
D-Dimer, Quant: >20 ug/mL-FEU — ABNORMAL HIGH (ref 0.00–0.50)
D-Dimer, Quant: >20 ug/mL-FEU — ABNORMAL HIGH (ref 0.00–0.50)

## 2019-07-14 LAB — C-REACTIVE PROTEIN: CRP: 32.4 mg/dL — ABNORMAL HIGH (ref <1.0)

## 2019-07-14 LAB — ABO/RH: ABO/RH(D): A NEG

## 2019-07-14 LAB — FERRITIN: Ferritin: 1400 ng/mL — ABNORMAL HIGH (ref 24–336)

## 2019-07-14 LAB — MAGNESIUM: Magnesium: 2.8 mg/dL — ABNORMAL HIGH (ref 1.7–2.4)

## 2019-07-14 LAB — PROCALCITONIN: Procalcitonin: 1.08 ng/mL

## 2019-07-14 LAB — PHOSPHORUS: Phosphorus: 3.5 mg/dL (ref 2.5–4.6)

## 2019-07-14 MED ORDER — POTASSIUM CHLORIDE CRYS ER 20 MEQ PO TBCR
40.0000 meq | EXTENDED_RELEASE_TABLET | Freq: Once | ORAL | Status: AC
  Administered 2019-07-14: 18:00:00 40 meq via ORAL
  Filled 2019-07-14: qty 2

## 2019-07-14 MED ORDER — HYDROCORTISONE ACETATE 25 MG RE SUPP
25.0000 mg | Freq: Two times a day (BID) | RECTAL | Status: DC | PRN
  Filled 2019-07-14: qty 1

## 2019-07-14 MED ORDER — HYDROCORTISONE ACETATE 25 MG RE SUPP
25.0000 mg | Freq: Three times a day (TID) | RECTAL | Status: AC
  Administered 2019-07-14 (×3): 25 mg via RECTAL
  Filled 2019-07-14 (×3): qty 1

## 2019-07-14 MED ORDER — HEPARIN (PORCINE) 25000 UT/250ML-% IV SOLN
1350.0000 [IU]/h | INTRAVENOUS | Status: DC
  Administered 2019-07-14: 1150 [IU]/h via INTRAVENOUS
  Administered 2019-07-15: 1350 [IU]/h via INTRAVENOUS
  Administered 2019-07-16 – 2019-07-18 (×4): 1450 [IU]/h via INTRAVENOUS
  Administered 2019-07-19 (×2): 1350 [IU]/h via INTRAVENOUS
  Filled 2019-07-14 (×8): qty 250

## 2019-07-14 MED ORDER — DEXAMETHASONE SODIUM PHOSPHATE 10 MG/ML IJ SOLN
10.0000 mg | INTRAMUSCULAR | Status: DC
  Administered 2019-07-14 – 2019-07-21 (×8): 10 mg via INTRAVENOUS
  Filled 2019-07-14 (×8): qty 1

## 2019-07-14 MED ORDER — SODIUM CHLORIDE 0.9% IV SOLUTION: Freq: Once | INTRAVENOUS | Status: AC

## 2019-07-14 NOTE — Progress Notes (Signed)
ANTICOAGULATION CONSULT NOTE --Follow-Up Consult  Pharmacy Consult for Heparin Indication: pulmonary embolus  No Known Allergies  Patient Measurements: Height: 6' (182.9 cm) Weight: 224 lb 13.9 oz (102 kg) IBW/kg (Calculated) : 77.6 Heparin Dosing Weight: 98.5 kg  Vital Signs: Temp: 97.6 F (36.4 C) (01/04 0400) Temp Source: Oral (01/04 0400) BP: 110/60 (01/04 0959) Pulse Rate: 77 (01/04 0959)  Labs: Recent Labs    07/13/19 1021 07/13/19 1119 07/13/19 1550 07/13/19 2315 07/14/19 0849  HGB 12.2* 12.0*  --   --  12.0*  HCT 36.4* 36.2*  --   --  37.3*  PLT 145* 141*  --   --  160  HEPARINUNFRC  --   --   --  0.48  --   CREATININE 1.41*  --   --   --  1.06  TROPONINIHS  --  713* 471*  --   --     Estimated Creatinine Clearance: 74.4 mL/min (by C-G formula based on SCr of 1.06 mg/dL).   Medical History: Past Medical History:  Diagnosis Date  . Atrial myxoma   . Cervical osteoarthritis   . History of TIA (transient ischemic attack)   . History of tinnitus   . Hyperlipidemia   . Hypertension   . Melanoma ([place])    per Dr. Allyson Sabal, local excision, no chemo/no rady tx.    Assessment: Pharmacy consulted to dose heparin infusion for this  74 yo CoVid + male that presented  to the ED with increased SOB.  Evidence of PE was found on  CT scan  and  his  D-Dimer is elevated at  >72mcg/mL-FEU.  Baseline platelet count is just  below normal at 143K.  Patient's heparin stopped this AM due to sporadic bleeding from a hemorrhoid. D/W day MD, will restart heparin for PE at previous therapeutic rate. Heparin level in 8 hours  Goal of Therapy:  Heparin level 0.3-0.7 units/ml Monitor platelets by anticoagulation protocol: Yes   Plan:  - Restart heparin infusion at 1150 units/hr -  Heparin level in ~8hours  - Monitor patient for s/s of bleeding and CBC while on heparin   Larsen Dungan A. Levada Dy, PharmD, BCPS, FNKF Clinical Pharmacist [place] Please utilize Amion for appropriate  phone number to reach the unit pharmacist ([place])   07/14/2019 11:03 AM

## 2019-07-14 NOTE — Progress Notes (Signed)
PROGRESS NOTE                                                                                                                                                                                                             Patient Demographics:    Matthew Sullivan, is a 74 y.o. male, DOB - 1945/11/09, ZM:[id number]  Admit date - 07/13/2019   Admitting Physician Karmen Bongo, MD  Outpatient Primary MD for the patient is Tonia Ghent, MD  LOS - 1   Chief Complaint  Patient presents with  . COVID  . Shortness of Breath       Brief Narrative     74 y.o. male with medical history significant of HTN; HLD; and h/o atrial myxoma on Xarelto presenting with SOB.  He reports getting COVID from his wife and developing infection about 2 weeks ago.  Initially with fatigue and URI symptoms.  He has progressed over the past few days with increasing SOB.  Today, he knew he would die if he stayed home.  O2 sats with EMS were 53%.   ED Course:   COVID PNA and PE.  Acutely worse x 3 days.  O2 sat in 50s.  On 15L with O2 sats in mid-90s.  AG positive.  D-dimer 20, CRP 33.  Troponin 713, no chest pain.  CXR with bibasilar PNA - Rocephin, Azithro, Decadron.  Ordering Remdesivir.  CTA with small PE.    Was admitted to [facility] for further management   Subjective:    Matthew Sullivan reports some hemorrhoidal bleed overnight, as well complains of cough, his heparin drip was held overnight .   Assessment  & Plan :    Principal Problem:   Acute respiratory disease due to COVID-19 virus Active Problems:   Pure hypercholesterolemia   HTN (hypertension)   Myxoma of heart   PAF (paroxysmal atrial fibrillation) (HCC)   Current use of long term anticoagulation   Pulmonary embolism (HCC)   Acute hypoxic respiratory failure due to COVID-19 up pneumonia -Patient with significant oxygen requirement, this morning he is on 15 L high flow nasal cannula. -Patient significant for  bilateral opacities. -Continue with IV remdesivir -Continue with IV steroids -Discussed convalescent plasma with the patient, he is agreeable, will proceed with transfusion -Not a candidate for Actemra given procalcitonin 1.8 on admission -Continue  with azithromycin and Rocephin given elevated procalcitonin, suspicion for bacterial pneumonia -Continue to trend inflammatory markers closely, especially significantly elevated CRP at 32, and D-dimer> 20   COVID-19 Labs  Recent Labs    07/13/19 1119 07/14/19 0849  DDIMER >20.00* >20.00*  FERRITIN 1,363* 1,400*  LDH 609*  --   CRP 32.9* 32.4*    Lab Results  Component Value Date   SARSCOV2NAA POSITIVE (A) 07/13/2019   Acute PE -Small branch and right lower lobe. -On Xarelto at home, this does not constitute Xarelto failure, given his PE likely in the setting of hypercoagulability state due to COVID-19, and the fact he did not take it for couple days. -Now continue with heparin GTT, hold Xarelto patient in the setting of hemorrhoidal bleed.   HTN -Continue to hold hydrochlorothiazide given the blood pressure on the soft side -Continue Lopressor  HLD -Continue high-dose Liptior  H/o atrial myxoma/a fib -Patient had a myxoma removed in 2014 and brief period of post-op afib that resolved after electrical cardioversion later that year -He is on Xarelto for this reason -Continue with metoprolol  Hemorrhoidal bleed -Patient reports history of hemorrhoids for last 20 years, currently denies any constipation, will start on Anusol suppositories will monitor closely given he is on heparin gtt.  Code Status : Full code  Family Communication  : Discussed with patient  Disposition Plan  : Home once stable  Barriers For Discharge : On 15 L oxygen  Consults  : None  Procedures  : None  DVT Prophylaxis  : Heparin GTT  Lab Results  Component Value Date   PLT 160 07/14/2019    Antibiotics  :    Anti-infectives (From  admission, onward)   Start     Dose/Rate Route Frequency Ordered Stop   07/14/19 1330  azithromycin (ZITHROMAX) 500 mg in sodium chloride 0.9 % 250 mL IVPB     500 mg 250 mL/hr over 60 Minutes Intravenous Every 24 hours 07/13/19 1824     07/14/19 1300  cefTRIAXone (ROCEPHIN) 1 g in sodium chloride 0.9 % 100 mL IVPB     1 g 200 mL/hr over 30 Minutes Intravenous Every 24 hours 07/13/19 1824     07/14/19 1000  remdesivir 100 mg in sodium chloride 0.9 % 100 mL IVPB     100 mg 200 mL/hr over 30 Minutes Intravenous Daily 07/13/19 1615 07/18/19 0959   07/13/19 1630  remdesivir 200 mg in sodium chloride 0.9% 250 mL IVPB     200 mg 580 mL/hr over 30 Minutes Intravenous Once 07/13/19 1615 07/13/19 1806   07/13/19 1115  cefTRIAXone (ROCEPHIN) 1 g in sodium chloride 0.9 % 100 mL IVPB     1 g 200 mL/hr over 30 Minutes Intravenous  Once 07/13/19 1111 07/13/19 1340   07/13/19 1115  azithromycin (ZITHROMAX) 500 mg in sodium chloride 0.9 % 250 mL IVPB     500 mg 250 mL/hr over 60 Minutes Intravenous  Once 07/13/19 1111 07/13/19 1515        Objective:   Vitals:   07/13/19 2115 07/14/19 0028 07/14/19 0400 07/14/19 0959  BP: 138/84 110/73 108/72 110/60  Pulse: 75 81 70 77  Resp: 20 (!) 22 16   Temp: 97.6 F (36.4 C) 97.6 F (36.4 C) 97.6 F (36.4 C)   TempSrc: Oral Oral Oral   SpO2: 98% 93% 94%   Weight:      Height:        Wt Readings from Last 3 Encounters:  07/13/19 102 kg  06/20/19 98.9 kg  04/22/19 102 kg     Intake/Output Summary (Last 24 hours) at 07/14/2019 1403 Last data filed at 07/14/2019 0622 Gross per 24 hour  Intake 4983 ml  Output 550 ml  Net 4433 ml     Physical Exam  Awake Alert, Oriented X 3, No new F.N deficits, Normal affect Symmetrical Chest wall movement, Good air movement bilaterally, CTAB RRR,No Gallops,Rubs or new Murmurs, No Parasternal Heave +ve B.Sounds, Abd Soft, No tenderness, , No rebound - guarding or rigidity. No Cyanosis, Clubbing or edema, No  new Rash or bruise  Has large external hemorrhoid at 3:00, with crusted blood    Data Review:    CBC Recent Labs  Lab 07/13/19 1021 07/13/19 1119 07/14/19 0849  WBC 6.4 6.1 10.6*  HGB 12.2* 12.0* 12.0*  HCT 36.4* 36.2* 37.3*  PLT 145* 141* 160  MCV 91.5 93.5 93.3  MCH 30.7 31.0 30.0  MCHC 33.5 33.1 32.2  RDW 14.0 14.1 14.0  LYMPHSABS  --  0.6*  --   MONOABS  --  0.2  --   EOSABS  --  0.0  --   BASOSABS  --  0.0  --     Chemistries  Recent Labs  Lab 07/13/19 1021 07/13/19 1119 07/14/19 0849  NA 131*  --  140  K 3.0*  --  3.3*  CL 94*  --  99  CO2 22  --  29  GLUCOSE 192*  --  178*  BUN 30*  --  29*  CREATININE 1.41*  --  1.06  CALCIUM 7.8*  --  8.1*  MG  --   --  2.8*  AST  --  120* 111*  ALT  --  104* 128*  ALKPHOS  --  67 81  BILITOT  --  1.1 0.7   ------------------------------------------------------------------------------------------------------------------ No results for input(s): CHOL, HDL, LDLCALC, TRIG, CHOLHDL, LDLDIRECT in the last 72 hours.  Lab Results  Component Value Date   HGBA1C 5.6 04/09/2013   ------------------------------------------------------------------------------------------------------------------ No results for input(s): TSH, T4TOTAL, T3FREE, THYROIDAB in the last 72 hours.  Invalid input(s): FREET3 ------------------------------------------------------------------------------------------------------------------ Recent Labs    07/13/19 1119 07/14/19 0849  FERRITIN 1,363* 1,400*    Coagulation profile No results for input(s): INR, PROTIME in the last 168 hours.  Recent Labs    07/13/19 1119 07/14/19 0849  DDIMER >20.00* >20.00*    Cardiac Enzymes No results for input(s): CKMB, TROPONINI, MYOGLOBIN in the last 168 hours.  Invalid input(s): CK ------------------------------------------------------------------------------------------------------------------    Component Value Date/Time   BNP 566.7 (H)  07/13/2019 1119    Inpatient Medications  Scheduled Meds: . atorvastatin  40 mg Oral QHS  . dexamethasone (DECADRON) injection  10 mg Intravenous Q24H  . hydrocortisone  25 mg Rectal TID  . metoprolol tartrate  12.5 mg Oral BID  . sodium chloride flush  3 mL Intravenous Q12H  . sodium chloride flush  3 mL Intravenous Q12H   Continuous Infusions: . sodium chloride    . azithromycin (ZITHROMAX) 500 MG IVPB (Vial-Mate [employer])    . cefTRIAXone (ROCEPHIN)  IV    . heparin 1,150 Units/hr (07/14/19 1234)  . remdesivir 100 mg in NS 100 mL 100 mg (07/14/19 1233)   PRN Meds:.sodium chloride, acetaminophen, albuterol, bisacodyl, chlorpheniramine-HYDROcodone, guaiFENesin-dextromethorphan, hydrocortisone, ondansetron **OR** ondansetron (ZOFRAN) IV, oxyCODONE, polyethylene glycol, sodium chloride flush, sodium phosphate  Micro Results Recent Results (from the past 240 hour(s))  Respiratory Panel by RT PCR (Flu A&B, Covid) -  Nasopharyngeal Swab     Status: Abnormal   Collection Time: 07/13/19 11:50 AM   Specimen: Nasopharyngeal Swab  Result Value Ref Range Status   SARS Coronavirus 2 by RT PCR POSITIVE (A) NEGATIVE Final    Comment: RESULT CALLED TO, READ BACK BY AND VERIFIED WITH: MCKWON ON [id number] AT [id number] BY NFIELDS (NOTE) SARS-CoV-2 target nucleic acids are DETECTED. SARS-CoV-2 RNA is generally detectable in upper respiratory specimens  during the acute phase of infection. Positive results are indicative of the presence of the identified virus, but do not rule out bacterial infection or co-infection with other pathogens not detected by the test. Clinical correlation with patient history and other diagnostic information is necessary to determine patient infection status. The expected result is Negative. Fact Sheet for Patients:  PinkCheek.be Fact Sheet for Healthcare Providers: GravelBags.it This test is not yet approved or  cleared by the [place] FDA and  has been authorized for detection and/or diagnosis of SARS-CoV-2 by FDA under an Emergency Use Authorization (EUA).  This EUA will remain in effect (meaning this test can be used) f or the duration of  the COVID-19 declaration under Section 564(b)(1) of the Act, 21 U.S.C. section 360bbb-3(b)(1), unless the authorization is terminated or revoked sooner.    Influenza A by PCR NEGATIVE NEGATIVE Final   Influenza B by PCR NEGATIVE NEGATIVE Final    Comment: (NOTE) The Xpert Xpress SARS-CoV-2/FLU/RSV assay is intended as an aid in  the diagnosis of influenza from Nasopharyngeal swab specimens and  should not be used as a sole basis for treatment. Nasal washings and  aspirates are unacceptable for Xpert Xpress SARS-CoV-2/FLU/RSV  testing. Fact Sheet for Patients: PinkCheek.be Fact Sheet for Healthcare Providers: GravelBags.it This test is not yet approved or cleared by the [place] FDA and  has been authorized for detection and/or diagnosis of SARS-CoV-2 by  FDA under an Emergency Use Authorization (EUA). This EUA will remain  in effect (meaning this test can be used) for the duration of the  Covid-19 declaration under Section 564(b)(1) of the Act, 21  U.S.C. section 360bbb-3(b)(1), unless the authorization is  terminated or revoked. Performed at [facility], [place] [address], [place], [place] [address]     Radiology Reports DG Chest 2 View  Result Date: 06/16/2019 CLINICAL DATA:  Chest pain. Additional history provided: Atrial fibrillation, numbness in right arm. EXAM: CHEST - 2 VIEW COMPARISON:  Chest radiograph 12/04/2015 FINDINGS: Stable, mild cardiomegaly. Prior median sternotomy. Aortic atherosclerosis. No airspace consolidation within the lungs. No evidence of pleural effusion or pneumothorax. No acute bony abnormality. Incompletely assessed cervical fusion hardware.  IMPRESSION: Stable, mild cardiomegaly. Aortic atherosclerosis. No evidence of airspace consolidation within the lungs. Electronically Signed   By: Kellie Simmering DO   On: 06/16/2019 13:13   CT Head Wo Contrast  Result Date: 06/16/2019 CLINICAL DATA:  Ataxia, stroke suspected, focal neuro deficit greater than 6 hours EXAM: CT HEAD WITHOUT CONTRAST TECHNIQUE: Contiguous axial images were obtained from the base of the skull through the vertex without intravenous contrast. COMPARISON:  CT head 04/05/2013 FINDINGS: Brain: Stable appearance of a well-defined CSF filled space in the left cerebellum, possible remote lacune versus prominent perivascular space. No evidence of acute infarction, hemorrhage, hydrocephalus, extra-axial collection or mass lesion/mass effect. Symmetric prominence of the ventricles, cisterns and sulci compatible with mild parenchymal volume loss. Patchy areas of white matter hypoattenuation are most compatible with chronic microvascular angiopathy. Vascular: Atherosclerotic calcification of the carotid siphons. No hyperdense  vessel. Skull: No calvarial fracture or suspicious osseous lesion. No scalp swelling or hematoma. Sinuses/Orbits: Paranasal sinuses and mastoid air cells are predominantly clear. Included orbital structures are unremarkable. Other: None IMPRESSION: 1. No acute intracranial abnormality. If persisting clinical concern for infarct, MRI is more sensitive and specific for subtle changes of ischemia. 2. Stable mild parenchymal volume loss and chronic microvascular angiopathy. 3. Stable region of gliosis versus prominent perivascular space in the left cerebellum. Electronically Signed   By: Lovena Le M.D.   On: 06/16/2019 20:26   CT Angio Chest PE W and/or Wo Contrast  Result Date: 07/13/2019 CLINICAL DATA:  Shortness of breath. Productive cough. EXAM: CT ANGIOGRAPHY CHEST WITH CONTRAST TECHNIQUE: Multidetector CT imaging of the chest was performed using the standard protocol  during bolus administration of intravenous contrast. Multiplanar CT image reconstructions and MIPs were obtained to evaluate the vascular anatomy. CONTRAST:  28mL OMNIPAQUE IOHEXOL 350 MG/ML SOLN COMPARISON:  Radiograph of same day. FINDINGS: Cardiovascular: Small filling defect is seen in lower lobe branch of right pulmonary artery consistent with small pulmonary embolus. Small cardiomegaly is noted. No pericardial effusion is noted. Atherosclerosis of thoracic aorta is noted without aneurysm formation. Mediastinum/Nodes: No enlarged mediastinal, hilar, or axillary lymph nodes. Thyroid gland, trachea, and esophagus demonstrate no significant findings. Lungs/Pleura: No pneumothorax or pleural effusion is noted. Large bilateral airspace opacities are noted concerning for multifocal pneumonia. Upper Abdomen: No acute abnormality. Musculoskeletal: Sternotomy wires are noted. No acute osseous abnormality is noted. Review of the MIP images confirms the above findings. IMPRESSION: 1. Small filling defect is seen in lower lobe branch of right pulmonary artery consistent with small pulmonary embolus. Critical Value/emergent results were called by telephone at the time of interpretation on 07/13/2019 at 3:21 pm to [place] , who verbally acknowledged these results. 2. Small cardiomegaly. 3. Large bilateral airspace opacities are noted concerning for multifocal pneumonia. 4. Aortic atherosclerosis. Aortic Atherosclerosis (ICD10-I70.0). Electronically Signed   By: Marijo Conception M.D.   On: 07/13/2019 15:22   DG Chest Portable 1 View  Result Date: 07/13/2019 CLINICAL DATA:  Hypoxemia. Likely COVID. Additional history provided: Patient's wife COVID positive. Generalized weakness, productive cough with yellowish green phlegm and fever for 2 weeks. EXAM: PORTABLE CHEST 1 VIEW COMPARISON:  Chest radiograph 06/16/2019 FINDINGS: Mild cardiomegaly, unchanged. Prior median sternotomy. Aortic atherosclerosis. Interval  development of bilateral ill-defined airspace opacities with a right upper lobe predominance on the right, and upper to mid lung field predominance on the left. No evidence of pleural effusion or pneumothorax. No acute bony abnormality. Partially visualized cervical ACDF hardware. IMPRESSION: Interval development of bilateral airspace opacities consistent with multifocal pneumonia. Cardiomegaly, unchanged Electronically Signed   By: Kellie Simmering DO   On: 07/13/2019 11:13     Emeline Gins Labrian Torregrossa M.D on 07/14/2019 at 2:03 PM  Between 7am to 7pm - Pager - [phone]  After 7pm go to [email or web address] - password [email or web address]  [facility] -  Office  [phone]

## 2019-07-14 NOTE — Progress Notes (Signed)
Initial Nutrition Assessment  DOCUMENTATION CODES:   Obesity unspecified  INTERVENTION:  Ensure complete with breakfast and Magic cup with lunch and dinner trays daily  Regular diet   Encourage meal/supplement intake   NUTRITION DIAGNOSIS:   Increased nutrient needs related to acute illness(COVID-19) as evidenced by estimated needs.   GOAL:  Patient will meet greater than or equal to 90% of their needs   MONITOR:  PO intake, Supplement acceptance, Weight trends, I & O's, Labs(respiratory status)  REASON FOR ASSESSMENT:   Malnutrition Screening Tool    ASSESSMENT:  RD working remotely.  Patient is an obese 74 yo male with a hx of HTN, HLD, Melanoma. Presents from home short of breath, poor appetite the past 2 weeks. Placed on nonrebreather oxygen saturation in the 50's. COVID positive.  Patient consumed 480 ml early this morning. Meal intake at lunch unknown.    Stable wt history based on chart review. Range 99-103 the past 2+ years. At high risk for malnutrition and weight loss based on reported poor appetite and acute illness the past 2 weeks.  Labs: Potassium 3.3 (L), BUN 29 (H), Glucose 178 (H). Ferritin 1400 (H), CRP-32.4 (H). D-dimer >20.00 (H). Troponin-471 (H).  NUTRITION - FOCUSED PHYSICAL EXAM: Unable to complete Nutrition-Focused physical exam at this time.  RD working remotely.   Diet Order:   Diet Order            Diet regular Room service appropriate? Yes; Fluid consistency: Thin  Diet effective now              EDUCATION NEEDS:  No education needs have been identified at this time Skin:  Skin Assessment: Reviewed RN Assessment  Last BM:  1/3- small amount of rectal blood  Height:   Ht Readings from Last 1 Encounters:  07/13/19 6' (1.829 m)    Weight:   Wt Readings from Last 1 Encounters:  07/13/19 102 kg    Ideal Body Weight:  81 kg  BMI:  Body mass index is 30.5 kg/m.  Estimated Nutritional Needs:   Kcal:   [id number]  Protein:  113-126 gr  Fluid:  >1900 ml daily   Colman Cater MS,RD,CSG,LDN Office: [phone] Pager: [phone]

## 2019-07-14 NOTE — Progress Notes (Signed)
Pt. is on the heparin gtt and has a hemorrhoid that is sparodically oozing small streaks of blood.   MD page

## 2019-07-14 NOTE — Progress Notes (Signed)
ANTICOAGULATION CONSULT NOTE --Follow-Up Consult  Pharmacy Consult for Heparin Indication: pulmonary embolus  No Known Allergies  Patient Measurements: Height: 6' (182.9 cm) Weight: 224 lb 13.9 oz (102 kg) IBW/kg (Calculated) : 77.6 Heparin Dosing Weight: 98.5 kg  Vital Signs: Temp: 97.6 F (36.4 C) (01/04 1600) Temp Source: Axillary (01/04 1600) BP: 121/87 (01/04 2000) Pulse Rate: 83 (01/04 2115)  Labs: Recent Labs    07/13/19 1021 07/13/19 1119 07/13/19 1550 07/13/19 2315 07/14/19 0849 07/14/19 1830  HGB 12.2* 12.0*  --   --  12.0*  --   HCT 36.4* 36.2*  --   --  37.3*  --   PLT 145* 141*  --   --  160  --   HEPARINUNFRC  --   --   --  0.48  --  0.14*  CREATININE 1.41*  --   --   --  1.06  --   TROPONINIHS  --  713* 471*  --   --   --     Estimated Creatinine Clearance: 74.4 mL/min (by C-G formula based on SCr of 1.06 mg/dL).   Medical History: Past Medical History:  Diagnosis Date  . Atrial myxoma   . Cervical osteoarthritis   . History of TIA (transient ischemic attack)   . History of tinnitus   . Hyperlipidemia   . Hypertension   . Melanoma ([place])    per Dr. Allyson Sabal, local excision, no chemo/no rady tx.    Assessment: Pharmacy consulted to dose heparin infusion for this  74 yo CoVid + male that presented  to the ED with increased SOB.  Evidence of PE was found on  CT scan  and  his  D-Dimer is elevated at  >65mcg/mL-FEU.  Baseline platelet count is just  below normal at 143K.  Patient's heparin stopped this AM due to sporadic bleeding from a hemorrhoid. D/W day MD, will restart heparin for PE at previous therapeutic rate. Heparin level in 8 hours  Heparin came back low this PM. Due to the bleeding issue, we will increase rate without a bolus and re-check a level in AM.   Goal of Therapy:  Heparin level 0.3-0.7 units/ml Monitor platelets by anticoagulation protocol: Yes   Plan:  - Increase heparin infusion to 1350 units/hr -  Heparin level in AM -  Monitor patient for s/s of bleeding and CBC while on heparin   Onnie Boer, PharmD, BCIDP, AAHIVP, CPP Infectious Disease Pharmacist 07/14/2019 9:33 PM

## 2019-07-14 NOTE — Progress Notes (Signed)
ANTICOAGULATION CONSULT NOTE --Follow-Up Consult  Pharmacy Consult for Heparin Indication: pulmonary embolus  No Known Allergies  Patient Measurements: Height: 6' (182.9 cm) Weight: 224 lb 13.9 oz (102 kg) IBW/kg (Calculated) : 77.6 Heparin Dosing Weight: 98.5 kg  Vital Signs: Temp: 97.6 F (36.4 C) (01/04 0028) Temp Source: Oral (01/04 0028) BP: 110/73 (01/04 0028) Pulse Rate: 81 (01/04 0028)  Labs: Recent Labs    07/13/19 1021 07/13/19 1119 07/13/19 1550 07/13/19 2315  HGB 12.2* 12.0*  --   --   HCT 36.4* 36.2*  --   --   PLT 145* 141*  --   --   HEPARINUNFRC  --   --   --  0.48  CREATININE 1.41*  --   --   --   TROPONINIHS  --  713* 471*  --     Estimated Creatinine Clearance: 56 mL/min (A) (by C-G formula based on SCr of 1.41 mg/dL (H)).   Medical History: Past Medical History:  Diagnosis Date  . Atrial myxoma   . Cervical osteoarthritis   . History of TIA (transient ischemic attack)   . History of tinnitus   . Hyperlipidemia   . Hypertension   . Melanoma ([place])    per Dr. Allyson Sabal, local excision, no chemo/no rady tx.    Assessment: Pharmacy consulted to dose heparin infusion for this  74 yo CoVid + male that presented  to the ED with increased SOB.  Evidence of PE was found on  CT scan  and  his  D-Dimer is elevated at  >37mcg/mL-FEU.  Baseline platelet count is just  below normal at 143K.  07/14/19 0100 UPDATE Heparin level: 0.48 IU/mL at 2315 on 07/13/19, which is within therapeutic goal range Per RN, no bleeding issues or complications at this time  Goal of Therapy:  Heparin level 0.3-0.7 units/ml Monitor platelets by anticoagulation protocol: Yes   Plan:  - Continue heparin infusion rate at 1150 units/hr - Confirmatory heparin level in ~8hours  - Monitor patient for s/s of bleeding and CBC while on heparin   Despina Pole, Pharm. D. Clinical Pharmacist 07/14/2019 1:25 AM

## 2019-07-15 DIAGNOSIS — Z7901 Long term (current) use of anticoagulants: Secondary | ICD-10-CM

## 2019-07-15 LAB — COMPREHENSIVE METABOLIC PANEL
ALT: 146 U/L — ABNORMAL HIGH (ref 0–44)
AST: 86 U/L — ABNORMAL HIGH (ref 15–41)
Albumin: 2.9 g/dL — ABNORMAL LOW (ref 3.5–5.0)
Alkaline Phosphatase: 82 U/L (ref 38–126)
Anion gap: 12 (ref 5–15)
BUN: 30 mg/dL — ABNORMAL HIGH (ref 8–23)
CO2: 28 mmol/L (ref 22–32)
Calcium: 8.5 mg/dL — ABNORMAL LOW (ref 8.9–10.3)
Chloride: 101 mmol/L (ref 98–111)
Creatinine, Ser: 0.91 mg/dL (ref 0.61–1.24)
GFR calc Af Amer: >60 mL/min (ref >60)
GFR calc non Af Amer: >60 mL/min (ref >60)
Glucose, Bld: 194 mg/dL — ABNORMAL HIGH (ref 70–99)
Potassium: 3.8 mmol/L (ref 3.5–5.1)
Sodium: 141 mmol/L (ref 135–145)
Total Bilirubin: 0.9 mg/dL (ref 0.3–1.2)
Total Protein: 7 g/dL (ref 6.5–8.1)

## 2019-07-15 LAB — CBC
HCT: 37.4 % — ABNORMAL LOW (ref 39.0–52.0)
Hemoglobin: 12.2 g/dL — ABNORMAL LOW (ref 13.0–17.0)
MCH: 30.7 pg (ref 26.0–34.0)
MCHC: 32.6 g/dL (ref 30.0–36.0)
MCV: 94.2 fL (ref 80.0–100.0)
Platelets: 232 10*3/uL (ref 150–400)
RBC: 3.97 MIL/uL — ABNORMAL LOW (ref 4.22–5.81)
RDW: 14.3 % (ref 11.5–15.5)
WBC: 15.8 10*3/uL — ABNORMAL HIGH (ref 4.0–10.5)
nRBC: 0.1 % (ref 0.0–0.2)

## 2019-07-15 LAB — D-DIMER, QUANTITATIVE
D-Dimer, Quant: >20 ug/mL-FEU — ABNORMAL HIGH (ref 0.00–0.50)
D-Dimer, Quant: >20 ug/mL-FEU — ABNORMAL HIGH (ref 0.00–0.50)

## 2019-07-15 LAB — HEPARIN LEVEL (UNFRACTIONATED)
Heparin Unfractionated: 0.44 IU/mL (ref 0.30–0.70)
Heparin Unfractionated: <0.1 IU/mL — ABNORMAL LOW (ref 0.30–0.70)
Heparin Unfractionated: 0.15 [IU]/mL — ABNORMAL LOW (ref 0.30–0.70)

## 2019-07-15 LAB — FERRITIN: Ferritin: 1248 ng/mL — ABNORMAL HIGH (ref 24–336)

## 2019-07-15 LAB — PROCALCITONIN: Procalcitonin: 0.65 ng/mL

## 2019-07-15 LAB — PHOSPHORUS: Phosphorus: 3.7 mg/dL (ref 2.5–4.6)

## 2019-07-15 LAB — MAGNESIUM: Magnesium: 2.6 mg/dL — ABNORMAL HIGH (ref 1.7–2.4)

## 2019-07-15 LAB — C-REACTIVE PROTEIN: CRP: 17.9 mg/dL — ABNORMAL HIGH (ref <1.0)

## 2019-07-15 MED ORDER — HYDROCORTISONE ACETATE 25 MG RE SUPP
25.0000 mg | Freq: Two times a day (BID) | RECTAL | Status: AC
  Administered 2019-07-15 – 2019-07-16 (×2): 25 mg via RECTAL
  Filled 2019-07-15 (×4): qty 1

## 2019-07-15 MED ORDER — WITCH HAZEL-GLYCERIN EX PADS
MEDICATED_PAD | CUTANEOUS | Status: DC | PRN
  Filled 2019-07-15: qty 100

## 2019-07-15 NOTE — Progress Notes (Addendum)
PROGRESS NOTE                                                                                                                                                                                                             Patient Demographics:    Matthew Sullivan, is a 74 y.o. male, DOB - 16-Dec-1945, [id number]  Admit date - 07/13/2019   Admitting Physician Karmen Bongo, MD  Outpatient Primary MD for the patient is Tonia Ghent, MD  LOS - 2   Chief Complaint  Patient presents with  . COVID  . Shortness of Breath       Brief Narrative     74 y.o. male with medical history significant of HTN; HLD; and h/o atrial myxoma on Xarelto presenting with SOB.  He reports getting COVID from his wife and developing infection about 2 weeks ago.  Initially with fatigue and URI symptoms.  He has progressed over the past few days with increasing SOB.  Today, he knew he would die if he stayed home.  O2 sats with EMS were 53%.  CTA chest significant for  COVID PNA and PE.  Wearing 15 L nasal cannula, on presentation, started on heparin gtt. for his PE, transferred to [facility] for further management.   Subjective:    Matthew Sullivan reports hemorrhoid still bothering home, but no significant bleed, reports loose bowel movement .   Assessment  & Plan :    Principal Problem:   Acute respiratory disease due to COVID-19 virus Active Problems:   Pure hypercholesterolemia   HTN (hypertension)   Myxoma of heart   PAF (paroxysmal atrial fibrillation) (HCC)   Current use of long term anticoagulation   Pulmonary embolism (HCC)   Acute hypoxic respiratory failure due to COVID-19 up pneumonia -Patient with significant oxygen requirement, he is on 15 L nasal cannula presentation, gradually improving, this morning he is on 10 L. -Patient significant for bilateral opacities. -Continue with IV remdesivir -Continue with IV steroids -If convulsant plasma 1/5 -Not a candidate  for Actemra given procalcitonin 1.8 on admission, but overall his procalcitonin is trending down. -Continue with azithromycin and Rocephin given elevated procalcitonin, suspicion for bacterial pneumonia -Continue to trend inflammatory markers closely, especially significantly elevated CRP at 32, and D-dimer> 20 presentation.  CRP trending down, but D-dimers remain >20.   COVID-19  Labs  Recent Labs    07/13/19 1119 07/14/19 0849 07/14/19 1626 07/15/19 0936  DDIMER >20.00* >20.00* >20.00* >20.00*  FERRITIN 1,363* 1,400*  --  1,248*  LDH 609*  --   --   --   CRP 32.9* 32.4*  --  17.9*    Lab Results  Component Value Date   SARSCOV2NAA POSITIVE (A) 07/13/2019   Acute PE -Small branch and right lower lobe. -On Xarelto at home, this does not constitute Xarelto failure, given his PE likely in the setting of hypercoagulability state due to COVID-19, and the fact he did not take it for couple days. -Now continue with heparin GTT, hold Xarelto patient in the setting of hemorrhoidal bleed.  HTN -Continue to hold hydrochlorothiazide given the blood pressure on the soft side -Continue Lopressor  Transaminitis -Due to Covid, monitor closely as on remdesivir  HLD -Continue high-dose Liptior  H/o atrial myxoma/a fib -Patient had a myxoma removed in 2014 and brief period of post-op afib that resolved after electrical cardioversion later that year -He is on Xarelto for this reason -Continue with metoprolol  Hemorrhoidal bleed -Patient reports history of hemorrhoids for last 20 years, currently denies any constipation, continue with Anusol suppositories as reported did help with his pain, as we will start on some hazel wipes.   Code Status : Full code  Family Communication  : Discussed with patient son this morning.  His request, as his son is a Software engineer  Disposition Plan  : Home once stable  Consults  : None  Procedures  : None  DVT Prophylaxis  : Heparin  GTT  Lab Results  Component Value Date   PLT 232 07/15/2019    Antibiotics  :    Anti-infectives (From admission, onward)   Start     Dose/Rate Route Frequency Ordered Stop   07/14/19 1330  azithromycin (ZITHROMAX) 500 mg in sodium chloride 0.9 % 250 mL IVPB     500 mg 250 mL/hr over 60 Minutes Intravenous Every 24 hours 07/13/19 1824     07/14/19 1300  cefTRIAXone (ROCEPHIN) 1 g in sodium chloride 0.9 % 100 mL IVPB     1 g 200 mL/hr over 30 Minutes Intravenous Every 24 hours 07/13/19 1824     07/14/19 1000  remdesivir 100 mg in sodium chloride 0.9 % 100 mL IVPB     100 mg 200 mL/hr over 30 Minutes Intravenous Daily 07/13/19 1615 07/18/19 0959   07/13/19 1630  remdesivir 200 mg in sodium chloride 0.9% 250 mL IVPB     200 mg 580 mL/hr over 30 Minutes Intravenous Once 07/13/19 1615 07/13/19 1806   07/13/19 1115  cefTRIAXone (ROCEPHIN) 1 g in sodium chloride 0.9 % 100 mL IVPB     1 g 200 mL/hr over 30 Minutes Intravenous  Once 07/13/19 1111 07/13/19 1340   07/13/19 1115  azithromycin (ZITHROMAX) 500 mg in sodium chloride 0.9 % 250 mL IVPB     500 mg 250 mL/hr over 60 Minutes Intravenous  Once 07/13/19 1111 07/13/19 1515        Objective:   Vitals:   07/15/19 0700 07/15/19 0907 07/15/19 1050 07/15/19 1125  BP: 114/73 117/69  104/78  Pulse: (!) 56 74 65 62  Resp: (!) 23 (!) 26 16 17   Temp: (!) 97.5 F (36.4 C)   97.6 F (36.4 C)  TempSrc: Oral   Oral  SpO2: 94% 94% 96% 91%  Weight:      Height:  Wt Readings from Last 3 Encounters:  07/13/19 102 kg  06/20/19 98.9 kg  04/22/19 102 kg     Intake/Output Summary (Last 24 hours) at 07/15/2019 1342 Last data filed at 07/15/2019 0928 Gross per 24 hour  Intake 1468.76 ml  Output 850 ml  Net 618.76 ml     Physical Exam  Awake Alert, Oriented X 3, No new F.N deficits, Normal affect Symmetrical Chest wall movement, Good air movement bilaterally, CTAB RRR,No Gallops,Rubs or new Murmurs, No Parasternal Heave +ve  B.Sounds, Abd Soft, No tenderness, No rebound - guarding or rigidity. No Cyanosis, Clubbing or edema, No new Rash or bruise       Data Review:    CBC Recent Labs  Lab 07/13/19 1021 07/13/19 1119 07/14/19 0849 07/15/19 0936  WBC 6.4 6.1 10.6* 15.8*  HGB 12.2* 12.0* 12.0* 12.2*  HCT 36.4* 36.2* 37.3* 37.4*  PLT 145* 141* 160 232  MCV 91.5 93.5 93.3 94.2  MCH 30.7 31.0 30.0 30.7  MCHC 33.5 33.1 32.2 32.6  RDW 14.0 14.1 14.0 14.3  LYMPHSABS  --  0.6*  --   --   MONOABS  --  0.2  --   --   EOSABS  --  0.0  --   --   BASOSABS  --  0.0  --   --     Chemistries  Recent Labs  Lab 07/13/19 1021 07/13/19 1119 07/14/19 0849 07/15/19 0936  NA 131*  --  140 141  K 3.0*  --  3.3* 3.8  CL 94*  --  99 101  CO2 22  --  29 28  GLUCOSE 192*  --  178* 194*  BUN 30*  --  29* 30*  CREATININE 1.41*  --  1.06 0.91  CALCIUM 7.8*  --  8.1* 8.5*  MG  --   --  2.8* 2.6*  AST  --  120* 111* 86*  ALT  --  104* 128* 146*  ALKPHOS  --  67 81 82  BILITOT  --  1.1 0.7 0.9   ------------------------------------------------------------------------------------------------------------------ No results for input(s): CHOL, HDL, LDLCALC, TRIG, CHOLHDL, LDLDIRECT in the last 72 hours.  Lab Results  Component Value Date   HGBA1C 5.6 04/09/2013   ------------------------------------------------------------------------------------------------------------------ No results for input(s): TSH, T4TOTAL, T3FREE, THYROIDAB in the last 72 hours.  Invalid input(s): FREET3 ------------------------------------------------------------------------------------------------------------------ Recent Labs    07/14/19 0849 07/15/19 0936  FERRITIN 1,400* 1,248*    Coagulation profile No results for input(s): INR, PROTIME in the last 168 hours.  Recent Labs    07/14/19 1626 07/15/19 0936  DDIMER >20.00* >20.00*    Cardiac Enzymes No results for input(s): CKMB, TROPONINI, MYOGLOBIN in the last 168  hours.  Invalid input(s): CK ------------------------------------------------------------------------------------------------------------------    Component Value Date/Time   BNP 566.7 (H) 07/13/2019 1119    Inpatient Medications  Scheduled Meds: . atorvastatin  40 mg Oral QHS  . dexamethasone (DECADRON) injection  10 mg Intravenous Q24H  . metoprolol tartrate  12.5 mg Oral BID  . sodium chloride flush  3 mL Intravenous Q12H  . sodium chloride flush  3 mL Intravenous Q12H   Continuous Infusions: . sodium chloride    . azithromycin (ZITHROMAX) 500 MG IVPB (Vial-Mate [employer]) 500 mg (07/14/19 1613)  . cefTRIAXone (ROCEPHIN)  IV 1 g (07/14/19 1555)  . heparin 1,350 Units/hr (07/15/19 1134)  . remdesivir 100 mg in NS 100 mL 100 mg (07/15/19 0927)   PRN Meds:.sodium chloride, acetaminophen, albuterol, bisacodyl, chlorpheniramine-HYDROcodone, guaiFENesin-dextromethorphan, hydrocortisone,  ondansetron **OR** ondansetron (ZOFRAN) IV, oxyCODONE, polyethylene glycol, sodium chloride flush, sodium phosphate  Micro Results Recent Results (from the past 240 hour(s))  Culture, blood (Routine X 2) w Reflex to ID Panel     Status: None (Preliminary result)   Collection Time: 07/13/19 11:40 AM   Specimen: BLOOD  Result Value Ref Range Status   Specimen Description BLOOD RIGHT ANTECUBITAL  Final   Special Requests   Final    BOTTLES DRAWN AEROBIC AND ANAEROBIC Blood Culture results may not be optimal due to an inadequate volume of blood received in culture bottles   Culture   Final    NO GROWTH 2 DAYS Performed at [facility], [place] [address] [place], [place] [address]    Report Status PENDING  Incomplete  Culture, blood (Routine X 2) w Reflex to ID Panel     Status: None (Preliminary result)   Collection Time: 07/13/19 11:50 AM   Specimen: BLOOD  Result Value Ref Range Status   Specimen Description BLOOD LEFT ANTECUBITAL  Final   Special Requests   Final    BOTTLES DRAWN AEROBIC  AND ANAEROBIC Blood Culture results may not be optimal due to an inadequate volume of blood received in culture bottles   Culture   Final    NO GROWTH 2 DAYS Performed at [facility], [place] [address] [place], [place] [address]    Report Status PENDING  Incomplete  Respiratory Panel by RT PCR (Flu A&B, Covid) - Nasopharyngeal Swab     Status: Abnormal   Collection Time: 07/13/19 11:50 AM   Specimen: Nasopharyngeal Swab  Result Value Ref Range Status   SARS Coronavirus 2 by RT PCR POSITIVE (A) NEGATIVE Final    Comment: RESULT CALLED TO, READ BACK BY AND VERIFIED WITH: MCKWON ON [id number] AT [id number] BY NFIELDS (NOTE) SARS-CoV-2 target nucleic acids are DETECTED. SARS-CoV-2 RNA is generally detectable in upper respiratory specimens  during the acute phase of infection. Positive results are indicative of the presence of the identified virus, but do not rule out bacterial infection or co-infection with other pathogens not detected by the test. Clinical correlation with patient history and other diagnostic information is necessary to determine patient infection status. The expected result is Negative. Fact Sheet for Patients:  PinkCheek.be Fact Sheet for Healthcare Providers: GravelBags.it This test is not yet approved or cleared by the [place] FDA and  has been authorized for detection and/or diagnosis of SARS-CoV-2 by FDA under an Emergency Use Authorization (EUA).  This EUA will remain in effect (meaning this test can be used) f or the duration of  the COVID-19 declaration under Section 564(b)(1) of the Act, 21 U.S.C. section 360bbb-3(b)(1), unless the authorization is terminated or revoked sooner.    Influenza A by PCR NEGATIVE NEGATIVE Final   Influenza B by PCR NEGATIVE NEGATIVE Final    Comment: (NOTE) The Xpert Xpress SARS-CoV-2/FLU/RSV assay is intended as an aid in  the diagnosis of influenza from Nasopharyngeal  swab specimens and  should not be used as a sole basis for treatment. Nasal washings and  aspirates are unacceptable for Xpert Xpress SARS-CoV-2/FLU/RSV  testing. Fact Sheet for Patients: PinkCheek.be Fact Sheet for Healthcare Providers: GravelBags.it This test is not yet approved or cleared by the [place] FDA and  has been authorized for detection and/or diagnosis of SARS-CoV-2 by  FDA under an Emergency Use Authorization (EUA). This EUA will remain  in effect (meaning this test can be used) for  the duration of the  Covid-19 declaration under Section 564(b)(1) of the Act, 21  U.S.C. section 360bbb-3(b)(1), unless the authorization is  terminated or revoked. Performed at [facility], [place] [address], [place], [place] [address]     Radiology Reports DG Chest 2 View  Result Date: 06/16/2019 CLINICAL DATA:  Chest pain. Additional history provided: Atrial fibrillation, numbness in right arm. EXAM: CHEST - 2 VIEW COMPARISON:  Chest radiograph 12/04/2015 FINDINGS: Stable, mild cardiomegaly. Prior median sternotomy. Aortic atherosclerosis. No airspace consolidation within the lungs. No evidence of pleural effusion or pneumothorax. No acute bony abnormality. Incompletely assessed cervical fusion hardware. IMPRESSION: Stable, mild cardiomegaly. Aortic atherosclerosis. No evidence of airspace consolidation within the lungs. Electronically Signed   By: Kellie Simmering DO   On: 06/16/2019 13:13   CT Head Wo Contrast  Result Date: 06/16/2019 CLINICAL DATA:  Ataxia, stroke suspected, focal neuro deficit greater than 6 hours EXAM: CT HEAD WITHOUT CONTRAST TECHNIQUE: Contiguous axial images were obtained from the base of the skull through the vertex without intravenous contrast. COMPARISON:  CT head 04/05/2013 FINDINGS: Brain: Stable appearance of a well-defined CSF filled space in the left cerebellum, possible remote lacune versus  prominent perivascular space. No evidence of acute infarction, hemorrhage, hydrocephalus, extra-axial collection or mass lesion/mass effect. Symmetric prominence of the ventricles, cisterns and sulci compatible with mild parenchymal volume loss. Patchy areas of white matter hypoattenuation are most compatible with chronic microvascular angiopathy. Vascular: Atherosclerotic calcification of the carotid siphons. No hyperdense vessel. Skull: No calvarial fracture or suspicious osseous lesion. No scalp swelling or hematoma. Sinuses/Orbits: Paranasal sinuses and mastoid air cells are predominantly clear. Included orbital structures are unremarkable. Other: None IMPRESSION: 1. No acute intracranial abnormality. If persisting clinical concern for infarct, MRI is more sensitive and specific for subtle changes of ischemia. 2. Stable mild parenchymal volume loss and chronic microvascular angiopathy. 3. Stable region of gliosis versus prominent perivascular space in the left cerebellum. Electronically Signed   By: Lovena Le M.D.   On: 06/16/2019 20:26   CT Angio Chest PE W and/or Wo Contrast  Result Date: 07/13/2019 CLINICAL DATA:  Shortness of breath. Productive cough. EXAM: CT ANGIOGRAPHY CHEST WITH CONTRAST TECHNIQUE: Multidetector CT imaging of the chest was performed using the standard protocol during bolus administration of intravenous contrast. Multiplanar CT image reconstructions and MIPs were obtained to evaluate the vascular anatomy. CONTRAST:  31mL OMNIPAQUE IOHEXOL 350 MG/ML SOLN COMPARISON:  Radiograph of same day. FINDINGS: Cardiovascular: Small filling defect is seen in lower lobe branch of right pulmonary artery consistent with small pulmonary embolus. Small cardiomegaly is noted. No pericardial effusion is noted. Atherosclerosis of thoracic aorta is noted without aneurysm formation. Mediastinum/Nodes: No enlarged mediastinal, hilar, or axillary lymph nodes. Thyroid gland, trachea, and esophagus  demonstrate no significant findings. Lungs/Pleura: No pneumothorax or pleural effusion is noted. Large bilateral airspace opacities are noted concerning for multifocal pneumonia. Upper Abdomen: No acute abnormality. Musculoskeletal: Sternotomy wires are noted. No acute osseous abnormality is noted. Review of the MIP images confirms the above findings. IMPRESSION: 1. Small filling defect is seen in lower lobe branch of right pulmonary artery consistent with small pulmonary embolus. Critical Value/emergent results were called by telephone at the time of interpretation on 07/13/2019 at 3:21 pm to [place] , who verbally acknowledged these results. 2. Small cardiomegaly. 3. Large bilateral airspace opacities are noted concerning for multifocal pneumonia. 4. Aortic atherosclerosis. Aortic Atherosclerosis (ICD10-I70.0). Electronically Signed   By: Bobbe Medico.D.  On: 07/13/2019 15:22   DG Chest Portable 1 View  Result Date: 07/13/2019 CLINICAL DATA:  Hypoxemia. Likely COVID. Additional history provided: Patient's wife COVID positive. Generalized weakness, productive cough with yellowish green phlegm and fever for 2 weeks. EXAM: PORTABLE CHEST 1 VIEW COMPARISON:  Chest radiograph 06/16/2019 FINDINGS: Mild cardiomegaly, unchanged. Prior median sternotomy. Aortic atherosclerosis. Interval development of bilateral ill-defined airspace opacities with a right upper lobe predominance on the right, and upper to mid lung field predominance on the left. No evidence of pleural effusion or pneumothorax. No acute bony abnormality. Partially visualized cervical ACDF hardware. IMPRESSION: Interval development of bilateral airspace opacities consistent with multifocal pneumonia. Cardiomegaly, unchanged Electronically Signed   By: Kellie Simmering DO   On: 07/13/2019 11:13     Emanuele Mcwhirter M.D on 07/15/2019 at 1:42 PM  Between 7am to 7pm - Pager - [phone]  After 7pm go to [email or web address] - password  [facility]  [facility] -  Office  [phone]

## 2019-07-15 NOTE — Plan of Care (Signed)
  Problem: Health Behavior/Discharge Planning: Goal: Ability to manage health-related needs will improve Outcome: Progressing   Problem: Clinical Measurements: Goal: Ability to maintain clinical measurements within normal limits will improve Outcome: Progressing Goal: Will remain free from infection Outcome: Progressing Goal: Diagnostic test results will improve Outcome: Progressing Goal: Respiratory complications will improve Outcome: Progressing Goal: Cardiovascular complication will be avoided Outcome: Progressing   Problem: Activity: Goal: Risk for activity intolerance will decrease Outcome: Progressing   Problem: Coping: Goal: Level of anxiety will decrease Outcome: Progressing   Problem: Elimination: Goal: Will not experience complications related to bowel motility Outcome: Progressing Goal: Will not experience complications related to urinary retention Outcome: Progressing   Problem: Pain Managment: Goal: General experience of comfort will improve Outcome: Progressing   Problem: Safety: Goal: Ability to remain free from injury will improve Outcome: Progressing   Problem: Education: Goal: Knowledge of risk factors and measures for prevention of condition will improve Outcome: Progressing   Problem: Coping: Goal: Psychosocial and spiritual needs will be supported Outcome: Progressing   Problem: Respiratory: Goal: Will maintain a patent airway Outcome: Progressing Goal: Complications related to the disease process, condition or treatment will be avoided or minimized Outcome: Progressing

## 2019-07-15 NOTE — Progress Notes (Addendum)
ANTICOAGULATION CONSULT NOTE --Follow-Up Consult  Pharmacy Consult for Heparin Indication: pulmonary embolus  No Known Allergies  Patient Measurements: Height: 6' (182.9 cm) Weight: 224 lb 13.9 oz (102 kg) IBW/kg (Calculated) : 77.6 Heparin Dosing Weight: 98.5 kg  Vital Signs: Temp: 97.7 F (36.5 C) (01/05 2034) Temp Source: Oral (01/05 2034) BP: 108/82 (01/05 2155) Pulse Rate: 65 (01/05 2155)  Labs: Recent Labs    07/13/19 1021 07/13/19 1021 07/13/19 1119 07/13/19 1550 07/14/19 0849 07/15/19 0936 07/15/19 1622 07/15/19 2045  HGB 12.2*  --  12.0*  --  12.0* 12.2*  --   --   HCT 36.4*  --  36.2*  --  37.3* 37.4*  --   --   PLT 145*  --  141*  --  160 232  --   --   HEPARINUNFRC  --    < >  --   --   --  0.44 <0.10* 0.15*  CREATININE 1.41*  --   --   --  1.06 0.91  --   --   TROPONINIHS  --   --  713* 471*  --   --   --   --    < > = values in this interval not displayed.    Estimated Creatinine Clearance: 86.7 mL/min (by C-G formula based on SCr of 0.91 mg/dL).   Assessment: Pharmacy consulted to dose heparin infusion for this  74 yo CoVid + male that presented  to the ED with increased SOB.  Evidence of PE was found on  CT scan  and  his  D-Dimer is elevated at  >20mcg/mL-FEU.  Baseline platelet count is just  below normal at 143K.  07/15/19 2200 UPDATE  Heparin level: 0.15 IU/mL, below therapeutic goal range D-Dimer remains >20.00 mcg/mL-FEU RN reports no bleeding or infusion site complications.   Goal of Therapy:  Heparin level 0.3-0.7 units/ml Monitor platelets by anticoagulation protocol: Yes   Plan:  - Increase heparin infusion rate  to 1450 units/hr - Re-check heparin level 8 hours after rate chang in am - Monitor patient for s/s of bleeding and CBC while on heparin   Despina Pole, Pharm. D. Clinical Pharmacist 07/15/2019 10:42 PM

## 2019-07-15 NOTE — Progress Notes (Addendum)
ANTICOAGULATION CONSULT NOTE --Follow-Up Consult  Pharmacy Consult for Heparin Indication: pulmonary embolus  No Known Allergies  Patient Measurements: Height: 6' (182.9 cm) Weight: 224 lb 13.9 oz (102 kg) IBW/kg (Calculated) : 77.6 Heparin Dosing Weight: 98.5 kg  Vital Signs: Temp: 97.6 F (36.4 C) (01/05 1125) Temp Source: Oral (01/05 1125) BP: 104/78 (01/05 1125) Pulse Rate: 62 (01/05 1125)  Labs: Recent Labs    07/13/19 1021 07/13/19 1119 07/13/19 1550 07/13/19 2315 07/14/19 0849 07/14/19 1830 07/15/19 0936  HGB 12.2* 12.0*  --   --  12.0*  --  12.2*  HCT 36.4* 36.2*  --   --  37.3*  --  37.4*  PLT 145* 141*  --   --  160  --  232  HEPARINUNFRC  --   --   --  0.48  --  0.14* 0.44  CREATININE 1.41*  --   --   --  1.06  --  0.91  TROPONINIHS  --  713* 471*  --   --   --   --     Estimated Creatinine Clearance: 86.7 mL/min (by C-G formula based on SCr of 0.91 mg/dL).   Medical History: Past Medical History:  Diagnosis Date  . Atrial myxoma   . Cervical osteoarthritis   . History of TIA (transient ischemic attack)   . History of tinnitus   . Hyperlipidemia   . Hypertension   . Melanoma ([place])    per Dr. Allyson Sabal, local excision, no chemo/no rady tx.    Assessment: Pharmacy consulted to dose heparin infusion for this  74 yo CoVid + male that presented  to the ED with increased SOB.  Evidence of PE was found on  CT scan  and  his  D-Dimer is elevated at  >45mcg/mL-FEU.  Baseline platelet count is just  below normal at 143K.  Level this AM delayed, but therapeutic at 0.44. will continue at current rate and repeat heparin level at 1630. Hgb stable. DDimer still >20.   Goal of Therapy:  Heparin level 0.3-0.7 units/ml Monitor platelets by anticoagulation protocol: Yes   Plan:  - Increase heparin infusion to 1350 units/hr -  Heparin level 8 hours from last level to confirm - Monitor patient for s/s of bleeding and CBC while on heparin   Tyeson Tanimoto A. Levada Dy,  PharmD, BCPS, FNKF Clinical Pharmacist [place] Please utilize Amion for appropriate phone number to reach the unit pharmacist ([place])   07/15/2019 1:25 PM

## 2019-07-16 DIAGNOSIS — I2699 Other pulmonary embolism without acute cor pulmonale: Secondary | ICD-10-CM

## 2019-07-16 DIAGNOSIS — D151 Benign neoplasm of heart: Secondary | ICD-10-CM

## 2019-07-16 DIAGNOSIS — I480 Paroxysmal atrial fibrillation: Secondary | ICD-10-CM

## 2019-07-16 DIAGNOSIS — U071 COVID-19: Principal | ICD-10-CM

## 2019-07-16 DIAGNOSIS — J9601 Acute respiratory failure with hypoxia: Secondary | ICD-10-CM

## 2019-07-16 LAB — CBC
HCT: 36.5 % — ABNORMAL LOW (ref 39.0–52.0)
Hemoglobin: 11.7 g/dL — ABNORMAL LOW (ref 13.0–17.0)
MCH: 30.5 pg (ref 26.0–34.0)
MCHC: 32.1 g/dL (ref 30.0–36.0)
MCV: 95.3 fL (ref 80.0–100.0)
Platelets: 210 10*3/uL (ref 150–400)
RBC: 3.83 MIL/uL — ABNORMAL LOW (ref 4.22–5.81)
RDW: 14.3 % (ref 11.5–15.5)
WBC: 10.2 10*3/uL (ref 4.0–10.5)
nRBC: 0 % (ref 0.0–0.2)

## 2019-07-16 LAB — COMPREHENSIVE METABOLIC PANEL
ALT: 141 U/L — ABNORMAL HIGH (ref 0–44)
AST: 77 U/L — ABNORMAL HIGH (ref 15–41)
Albumin: 2.6 g/dL — ABNORMAL LOW (ref 3.5–5.0)
Alkaline Phosphatase: 78 U/L (ref 38–126)
Anion gap: 12 (ref 5–15)
BUN: 27 mg/dL — ABNORMAL HIGH (ref 8–23)
CO2: 29 mmol/L (ref 22–32)
Calcium: 8.1 mg/dL — ABNORMAL LOW (ref 8.9–10.3)
Chloride: 100 mmol/L (ref 98–111)
Creatinine, Ser: 0.73 mg/dL (ref 0.61–1.24)
GFR calc Af Amer: >60 mL/min (ref >60)
GFR calc non Af Amer: >60 mL/min (ref >60)
Glucose, Bld: 157 mg/dL — ABNORMAL HIGH (ref 70–99)
Potassium: 4.1 mmol/L (ref 3.5–5.1)
Sodium: 141 mmol/L (ref 135–145)
Total Bilirubin: 1 mg/dL (ref 0.3–1.2)
Total Protein: 6.4 g/dL — ABNORMAL LOW (ref 6.5–8.1)

## 2019-07-16 LAB — PREPARE FRESH FROZEN PLASMA

## 2019-07-16 LAB — PROCALCITONIN: Procalcitonin: 0.42 ng/mL

## 2019-07-16 LAB — D-DIMER, QUANTITATIVE
D-Dimer, Quant: >20 ug/mL-FEU — ABNORMAL HIGH (ref 0.00–0.50)
D-Dimer, Quant: >20 ug/mL-FEU — ABNORMAL HIGH (ref 0.00–0.50)

## 2019-07-16 LAB — BPAM FFP
Blood Product Expiration Date: 202101052010
ISSUE DATE / TIME: 202101050049
Unit Type and Rh: 6200

## 2019-07-16 LAB — HEPARIN LEVEL (UNFRACTIONATED)
Heparin Unfractionated: 0.39 IU/mL (ref 0.30–0.70)
Heparin Unfractionated: 0.37 IU/mL (ref 0.30–0.70)

## 2019-07-16 LAB — MAGNESIUM: Magnesium: 2.3 mg/dL (ref 1.7–2.4)

## 2019-07-16 LAB — FERRITIN: Ferritin: 1087 ng/mL — ABNORMAL HIGH (ref 24–336)

## 2019-07-16 LAB — PHOSPHORUS: Phosphorus: 4 mg/dL (ref 2.5–4.6)

## 2019-07-16 LAB — C-REACTIVE PROTEIN: CRP: 10 mg/dL — ABNORMAL HIGH (ref <1.0)

## 2019-07-16 MED ORDER — ENSURE ENLIVE PO LIQD
237.0000 mL | Freq: Three times a day (TID) | ORAL | Status: DC
  Administered 2019-07-16 – 2019-07-23 (×17): 237 mL via ORAL

## 2019-07-16 NOTE — Plan of Care (Signed)
  Problem: Health Behavior/Discharge Planning: Goal: Ability to manage health-related needs will improve Outcome: Progressing   Problem: Clinical Measurements: Goal: Ability to maintain clinical measurements within normal limits will improve Outcome: Progressing Goal: Will remain free from infection Outcome: Progressing Goal: Diagnostic test results will improve Outcome: Progressing Goal: Respiratory complications will improve Outcome: Progressing Goal: Cardiovascular complication will be avoided Outcome: Progressing   Problem: Activity: Goal: Risk for activity intolerance will decrease Outcome: Progressing   Problem: Coping: Goal: Level of anxiety will decrease Outcome: Progressing   Problem: Elimination: Goal: Will not experience complications related to bowel motility Outcome: Progressing Goal: Will not experience complications related to urinary retention Outcome: Progressing   Problem: Pain Managment: Goal: General experience of comfort will improve Outcome: Progressing   Problem: Safety: Goal: Ability to remain free from injury will improve Outcome: Progressing   Problem: Education: Goal: Knowledge of risk factors and measures for prevention of condition will improve Outcome: Progressing   Problem: Coping: Goal: Psychosocial and spiritual needs will be supported Outcome: Progressing   Problem: Respiratory: Goal: Will maintain a patent airway Outcome: Progressing Goal: Complications related to the disease process, condition or treatment will be avoided or minimized Outcome: Progressing

## 2019-07-16 NOTE — Progress Notes (Signed)
ANTICOAGULATION CONSULT NOTE --Follow-Up Consult  Pharmacy Consult for Heparin Indication: pulmonary embolus  No Known Allergies  Patient Measurements: Height: 6' (182.9 cm) Weight: 224 lb 13.9 oz (102 kg) IBW/kg (Calculated) : 77.6 Heparin Dosing Weight: 98.5 kg  Vital Signs: Temp: 98.3 F (36.8 C) (01/06 1553) Temp Source: Oral (01/06 1553) BP: 114/62 (01/06 1553) Pulse Rate: 78 (01/06 1553)  Labs: Recent Labs    07/14/19 0849 07/15/19 0936 07/15/19 2045 07/16/19 0711 07/16/19 1504  HGB 12.0* 12.2*  --  11.7*  --   HCT 37.3* 37.4*  --  36.5*  --   PLT 160 232  --  210  --   HEPARINUNFRC  --  0.44 0.15* 0.39 0.37  CREATININE 1.06 0.91  --  0.73  --     Estimated Creatinine Clearance: 98.6 mL/min (by C-G formula based on SCr of 0.73 mg/dL).   Assessment: On heparin gtt for PE. Confirmed on CT. Was on Xarelto PTA (unsure if compliance issue vs treatment failure) Heparin level therapeutic x 2  Goal of Therapy:  Heparin level 0.3-0.7 units/ml Monitor platelets by anticoagulation protocol: Yes   Plan:  Continue heparin drip at 1,450 units/hr Monitor daily heparin level, CBC, s/s of bleed  Thank you Anette Guarneri, PharmD 07/16/2019 5:20 PM

## 2019-07-16 NOTE — Progress Notes (Signed)
ANTICOAGULATION CONSULT NOTE --Follow-Up Consult  Pharmacy Consult for Heparin Indication: pulmonary embolus  No Known Allergies  Patient Measurements: Height: 6' (182.9 cm) Weight: 224 lb 13.9 oz (102 kg) IBW/kg (Calculated) : 77.6 Heparin Dosing Weight: 98.5 kg  Vital Signs: Temp: 97.7 F (36.5 C) (01/06 0730) Temp Source: Oral (01/06 0730) BP: 111/67 (01/06 0730) Pulse Rate: 64 (01/06 0730)  Labs: Recent Labs    07/13/19 1119 07/13/19 1550 07/14/19 0849 07/15/19 0936 07/15/19 1622 07/15/19 2045 07/16/19 0711  HGB 12.0*  --  12.0* 12.2*  --   --  11.7*  HCT 36.2*  --  37.3* 37.4*  --   --  36.5*  PLT 141*  --  160 232  --   --  210  HEPARINUNFRC  --   --   --  0.44 <0.10* 0.15* 0.39  CREATININE  --   --  1.06 0.91  --   --  0.73  TROPONINIHS 713* 471*  --   --   --   --   --     Estimated Creatinine Clearance: 98.6 mL/min (by C-G formula based on SCr of 0.73 mg/dL).   Assessment: On heparin gtt for PE. Confirmed on CT. Was on Xarelto PTA (unsure if compliance issue vs treatment failure) Last heparin level therapeutic at 0.39. Hgb stable at 12.2, plts wnl.  Goal of Therapy:  Heparin level 0.3-0.7 units/ml Monitor platelets by anticoagulation protocol: Yes   Plan:  Continue heparin drip at 1,450 units/hr Check confirmatory HL at 1300 Monitor daily heparin level, CBC, s/s of bleed  Elenor Quinones, PharmD, BCPS, BCIDP Clinical Pharmacist 07/16/2019 9:34 AM

## 2019-07-16 NOTE — Progress Notes (Signed)
PROGRESS NOTE                                                                                                                                                                                                             Patient Demographics:    Matthew Sullivan, is a 74 y.o. male, DOB - Nov 22, 1945, [id number]  Outpatient Primary MD for the patient is Tonia Ghent, MD   Admit date - 07/13/2019   LOS - 3  Chief Complaint  Patient presents with  . COVID  . Shortness of Breath       Brief Narrative: Patient is a 74 y.o. male with PMHx of ofHTN; HLD; andh/o atrial myxoma on Xareltopresented with  SOB-found to have acute hypoxic resp failure 2/2 COVID PNA and PE.    Subjective:    [name] today feels better with rest, but gets SOB with ambulation. He denied chest pain, N/V.   Assessment  & Plan :   Acute Hypoxic Resp Failure due to Covid 19 Viral pneumonia, concurrent bacterial pneumonia and pulmonary embolism:slowly improving-initially was on 15L, now on 10L.Continue Remdesivir, steroids, IV Abx, and anticoagulation  Fever: afebrile  O2 requirements:  SpO2: 98 % O2 Flow Rate (L/min): 10 L/min   COVID-19 Labs: Recent Labs    07/14/19 0849 07/15/19 0936 07/15/19 1622 07/16/19 0711 07/16/19 1504  DDIMER >20.00* >20.00* >20.00* >20.00* >20.00*  FERRITIN 1,400* 1,248*  --  1,087*  --   CRP 32.4* 17.9*  --  10.0*  --        Component Value Date/Time   BNP 566.7 (H) 07/13/2019 1119    Recent Labs  Lab 07/13/19 1119 07/14/19 0849 07/15/19 0936 07/16/19 0711  PROCALCITON 1.86 1.08 0.65 0.42    Lab Results  Component Value Date   SARSCOV2NAA POSITIVE (A) 07/13/2019     COVID-19 Medications: Steroids:1/3>> Remdesivir:1/3>> Convalescent Plasma:1/4 x 1  Other medications: Diuretics:Euvolemic-no need for lasix Antibiotics: Rocephin: 1/3>> Zithromax:1/3 CBG's stable on SSI while on  steroid (if no hx of DM)  Prone/Incentive Spirometry: encouraged incentive spirometry use 3-4/hour.  DVT Prophylaxis  :  Lovenox   Acute [id number] branch and right lower lobe.On Xarelto at home, he has not been very compliant-likely PE provoked from COVID 19 and non compliance to xarelto. Suspect that this is not Xarelto failure. Continue IV heparin for  now.   HTN: controlled-continue lopressor  Transaminitis:2/2 covid-improving-follow  HLD:-Continue high-dose Liptior  H/o atrial myxoma/PAF:-hx of myxoma removed in 2014 and brief period of post-op afib that resolved after electrical cardioversion later that year. On anticoagulation-see above  Hemorrhoidal bleed:improved-continue supportive care with anusol suppository.   Nutrition Problem: Nutrition Problem: Increased nutrient needs Etiology: acute illness(COVID-19) Signs/Symptoms: estimated needs Interventions: Ensure Enlive (each supplement provides 350kcal and 20 grams of protein), Magic cup  Obesity: Estimated body mass index is 30.5 kg/m as calculated from the following:   Height as of this encounter: 6' (1.829 m).   Weight as of this encounter: 102 kg.    Consults  :  None  Procedures  :  None  ABG:    Component Value Date/Time   PHART 7.322 (L) 04/09/2013 1924   PCO2ART 45.2 (H) 04/09/2013 1924   PO2ART 110.0 (H) 04/09/2013 1924   HCO3 23.1 04/09/2013 1924   TCO2 24 04/10/2013 1635   ACIDBASEDEF 3.0 (H) 04/09/2013 1924   O2SAT 97.0 04/09/2013 1924    Vent Settings: N/A  Condition - Extremely Guarded  Family Communication  :    Code Status :  Full Code  Diet :  Diet Order            Diet regular Room service appropriate? Yes; Fluid consistency: Thin  Diet effective now               Disposition Plan  :  Remain hospitalized  Barriers to discharge: Hypoxia requiring O2 supplementation/complete 5 days of IV Remdesivir  Antimicorbials  :    Anti-infectives (From admission, onward)   Start      Dose/Rate Route Frequency Ordered Stop   07/14/19 1330  azithromycin (ZITHROMAX) 500 mg in sodium chloride 0.9 % 250 mL IVPB     500 mg 250 mL/hr over 60 Minutes Intravenous Every 24 hours 07/13/19 1824     07/14/19 1300  cefTRIAXone (ROCEPHIN) 1 g in sodium chloride 0.9 % 100 mL IVPB     1 g 200 mL/hr over 30 Minutes Intravenous Every 24 hours 07/13/19 1824     07/14/19 1000  remdesivir 100 mg in sodium chloride 0.9 % 100 mL IVPB     100 mg 200 mL/hr over 30 Minutes Intravenous Daily 07/13/19 1615 07/18/19 0959   07/13/19 1630  remdesivir 200 mg in sodium chloride 0.9% 250 mL IVPB     200 mg 580 mL/hr over 30 Minutes Intravenous Once 07/13/19 1615 07/13/19 1806   07/13/19 1115  cefTRIAXone (ROCEPHIN) 1 g in sodium chloride 0.9 % 100 mL IVPB     1 g 200 mL/hr over 30 Minutes Intravenous  Once 07/13/19 1111 07/13/19 1340   07/13/19 1115  azithromycin (ZITHROMAX) 500 mg in sodium chloride 0.9 % 250 mL IVPB     500 mg 250 mL/hr over 60 Minutes Intravenous  Once 07/13/19 1111 07/13/19 1515      Inpatient Medications  Scheduled Meds: . atorvastatin  40 mg Oral QHS  . dexamethasone (DECADRON) injection  10 mg Intravenous Q24H  . feeding supplement (ENSURE ENLIVE)  237 mL Oral TID BM  . hydrocortisone  25 mg Rectal BID  . metoprolol tartrate  12.5 mg Oral BID  . sodium chloride flush  3 mL Intravenous Q12H  . sodium chloride flush  3 mL Intravenous Q12H   Continuous Infusions: . sodium chloride    . azithromycin (ZITHROMAX) 500 MG IVPB (Vial-Mate Adaptor) 500 mg (07/16/19 1326)  . cefTRIAXone (ROCEPHIN)  IV 1 g (07/16/19 1220)  . heparin 1,450 Units/hr (07/16/19 0615)  . remdesivir 100 mg in NS 100 mL 100 mg (07/16/19 0917)   PRN Meds:.sodium chloride, acetaminophen, albuterol, bisacodyl, chlorpheniramine-HYDROcodone, guaiFENesin-dextromethorphan, hydrocortisone, ondansetron **OR** ondansetron (ZOFRAN) IV, oxyCODONE, polyethylene glycol, sodium chloride flush, sodium phosphate,  witch hazel-glycerin   Time Spent in minutes  35 *  See all Orders from today for further details   Oren Binet M.D on 07/16/2019 at 5:52 PM  To page go to [email or web address] - use universal password  [facility] -  Office  [phone]    Objective:   Vitals:   07/16/19 0420 07/16/19 0730 07/16/19 1113 07/16/19 1553  BP: (!) 106/55 111/67 114/66 114/62  Pulse: 64 64 65 78  Resp: 15 18 20 20   Temp: 97.7 F (36.5 C) 97.7 F (36.5 C) 97.9 F (36.6 C) 98.3 F (36.8 C)  TempSrc: Oral Oral Oral Oral  SpO2: 97% 97% 100% 98%  Weight:      Height:        Wt Readings from Last 3 Encounters:  07/13/19 102 kg  06/20/19 98.9 kg  04/22/19 102 kg     Intake/Output Summary (Last 24 hours) at 07/16/2019 1752 Last data filed at 07/16/2019 0419 Gross per 24 hour  Intake --  Output 900 ml  Net -900 ml     Physical Exam Gen Exam:Alert awake-not in any distress HEENT:atraumatic, normocephalic Chest: B/L clear to auscultation anteriorly CVS:S1S2 regular Abdomen:soft non tender, non distended Extremities:no edema Neurology: Non focal Skin: no rash   Data Review:    CBC Recent Labs  Lab 07/13/19 1021 07/13/19 1119 07/14/19 0849 07/15/19 0936 07/16/19 0711  WBC 6.4 6.1 10.6* 15.8* 10.2  HGB 12.2* 12.0* 12.0* 12.2* 11.7*  HCT 36.4* 36.2* 37.3* 37.4* 36.5*  PLT 145* 141* 160 232 210  MCV 91.5 93.5 93.3 94.2 95.3  MCH 30.7 31.0 30.0 30.7 30.5  MCHC 33.5 33.1 32.2 32.6 32.1  RDW 14.0 14.1 14.0 14.3 14.3  LYMPHSABS  --  0.6*  --   --   --   MONOABS  --  0.2  --   --   --   EOSABS  --  0.0  --   --   --   BASOSABS  --  0.0  --   --   --     Chemistries  Recent Labs  Lab 07/13/19 1021 07/13/19 1119 07/14/19 0849 07/15/19 0936 07/16/19 0711  NA 131*  --  140 141 141  K 3.0*  --  3.3* 3.8 4.1  CL 94*  --  99 101 100  CO2 22  --  29 28 29   GLUCOSE 192*  --  178* 194* 157*  BUN 30*  --  29* 30* 27*  CREATININE 1.41*  --  1.06 0.91 0.73  CALCIUM 7.8*  --   8.1* 8.5* 8.1*  MG  --   --  2.8* 2.6* 2.3  AST  --  120* 111* 86* 77*  ALT  --  104* 128* 146* 141*  ALKPHOS  --  67 81 82 78  BILITOT  --  1.1 0.7 0.9 1.0   ------------------------------------------------------------------------------------------------------------------ No results for input(s): CHOL, HDL, LDLCALC, TRIG, CHOLHDL, LDLDIRECT in the last 72 hours.  Lab Results  Component Value Date   HGBA1C 5.6 04/09/2013   ------------------------------------------------------------------------------------------------------------------ No results for input(s): TSH, T4TOTAL, T3FREE, THYROIDAB in the last 72 hours.  Invalid input(s): FREET3 ------------------------------------------------------------------------------------------------------------------ Recent Labs    07/15/19 [id number]  07/16/19 0711  FERRITIN 1,248* 1,087*    Coagulation profile No results for input(s): INR, PROTIME in the last 168 hours.  Recent Labs    07/16/19 0711 07/16/19 1504  DDIMER >20.00* >20.00*    Cardiac Enzymes No results for input(s): CKMB, TROPONINI, MYOGLOBIN in the last 168 hours.  Invalid input(s): CK ------------------------------------------------------------------------------------------------------------------    Component Value Date/Time   BNP 566.7 (H) 07/13/2019 1119    Micro Results Recent Results (from the past 240 hour(s))  Culture, blood (Routine X 2) w Reflex to ID Panel     Status: None (Preliminary result)   Collection Time: 07/13/19 11:40 AM   Specimen: BLOOD  Result Value Ref Range Status   Specimen Description BLOOD RIGHT ANTECUBITAL  Final   Special Requests   Final    BOTTLES DRAWN AEROBIC AND ANAEROBIC Blood Culture results may not be optimal due to an inadequate volume of blood received in culture bottles   Culture   Final    NO GROWTH 3 DAYS Performed at [facility], [place] [address] [place], [place] [address]    Report Status PENDING  Incomplete   Culture, blood (Routine X 2) w Reflex to ID Panel     Status: None (Preliminary result)   Collection Time: 07/13/19 11:50 AM   Specimen: BLOOD  Result Value Ref Range Status   Specimen Description BLOOD LEFT ANTECUBITAL  Final   Special Requests   Final    BOTTLES DRAWN AEROBIC AND ANAEROBIC Blood Culture results may not be optimal due to an inadequate volume of blood received in culture bottles   Culture   Final    NO GROWTH 3 DAYS Performed at [facility], [place] [address], [place], [place] [address]    Report Status PENDING  Incomplete  Respiratory Panel by RT PCR (Flu A&B, Covid) - Nasopharyngeal Swab     Status: Abnormal   Collection Time: 07/13/19 11:50 AM   Specimen: Nasopharyngeal Swab  Result Value Ref Range Status   SARS Coronavirus 2 by RT PCR POSITIVE (A) NEGATIVE Final    Comment: RESULT CALLED TO, READ BACK BY AND VERIFIED WITH: MCKWON ON [id number] AT [id number] BY NFIELDS (NOTE) SARS-CoV-2 target nucleic acids are DETECTED. SARS-CoV-2 RNA is generally detectable in upper respiratory specimens  during the acute phase of infection. Positive results are indicative of the presence of the identified virus, but do not rule out bacterial infection or co-infection with other pathogens not detected by the test. Clinical correlation with patient history and other diagnostic information is necessary to determine patient infection status. The expected result is Negative. Fact Sheet for Patients:  PinkCheek.be Fact Sheet for Healthcare Providers: GravelBags.it This test is not yet approved or cleared by the [place] FDA and  has been authorized for detection and/or diagnosis of SARS-CoV-2 by FDA under an Emergency Use Authorization (EUA).  This EUA will remain in effect (meaning this test can be used) f or the duration of  the COVID-19 declaration under Section 564(b)(1) of the Act, 21 U.S.C. section 360bbb-3(b)(1),  unless the authorization is terminated or revoked sooner.    Influenza A by PCR NEGATIVE NEGATIVE Final   Influenza B by PCR NEGATIVE NEGATIVE Final    Comment: (NOTE) The Xpert Xpress SARS-CoV-2/FLU/RSV assay is intended as an aid in  the diagnosis of influenza from Nasopharyngeal swab specimens and  should not be used as a sole basis for treatment. Nasal washings and  aspirates are unacceptable for Xpert Xpress SARS-CoV-2/FLU/RSV  testing. Fact Sheet for Patients: PinkCheek.be Fact Sheet for Healthcare Providers: GravelBags.it This test is not yet approved or cleared by the [place] FDA and  has been authorized for detection and/or diagnosis of SARS-CoV-2 by  FDA under an Emergency Use Authorization (EUA). This EUA will remain  in effect (meaning this test can be used) for the duration of the  Covid-19 declaration under Section 564(b)(1) of the Act, 21  U.S.C. section 360bbb-3(b)(1), unless the authorization is  terminated or revoked. Performed at [facility], [place] [address] [place], [place] [address]     Radiology Reports CT Head Wo Contrast  Result Date: 06/16/2019 CLINICAL DATA:  Ataxia, stroke suspected, focal neuro deficit greater than 6 hours EXAM: CT HEAD WITHOUT CONTRAST TECHNIQUE: Contiguous axial images were obtained from the base of the skull through the vertex without intravenous contrast. COMPARISON:  CT head 04/05/2013 FINDINGS: Brain: Stable appearance of a well-defined CSF filled space in the left cerebellum, possible remote lacune versus prominent perivascular space. No evidence of acute infarction, hemorrhage, hydrocephalus, extra-axial collection or mass lesion/mass effect. Symmetric prominence of the ventricles, cisterns and sulci compatible with mild parenchymal volume loss. Patchy areas of white matter hypoattenuation are most compatible with chronic microvascular angiopathy. Vascular:  Atherosclerotic calcification of the carotid siphons. No hyperdense vessel. Skull: No calvarial fracture or suspicious osseous lesion. No scalp swelling or hematoma. Sinuses/Orbits: Paranasal sinuses and mastoid air cells are predominantly clear. Included orbital structures are unremarkable. Other: None IMPRESSION: 1. No acute intracranial abnormality. If persisting clinical concern for infarct, MRI is more sensitive and specific for subtle changes of ischemia. 2. Stable mild parenchymal volume loss and chronic microvascular angiopathy. 3. Stable region of gliosis versus prominent perivascular space in the left cerebellum. Electronically Signed   By: Lovena Le M.D.   On: 06/16/2019 20:26   CT Angio Chest PE W and/or Wo Contrast  Result Date: 07/13/2019 CLINICAL DATA:  Shortness of breath. Productive cough. EXAM: CT ANGIOGRAPHY CHEST WITH CONTRAST TECHNIQUE: Multidetector CT imaging of the chest was performed using the standard protocol during bolus administration of intravenous contrast. Multiplanar CT image reconstructions and MIPs were obtained to evaluate the vascular anatomy. CONTRAST:  44mL OMNIPAQUE IOHEXOL 350 MG/ML SOLN COMPARISON:  Radiograph of same day. FINDINGS: Cardiovascular: Small filling defect is seen in lower lobe branch of right pulmonary artery consistent with small pulmonary embolus. Small cardiomegaly is noted. No pericardial effusion is noted. Atherosclerosis of thoracic aorta is noted without aneurysm formation. Mediastinum/Nodes: No enlarged mediastinal, hilar, or axillary lymph nodes. Thyroid gland, trachea, and esophagus demonstrate no significant findings. Lungs/Pleura: No pneumothorax or pleural effusion is noted. Large bilateral airspace opacities are noted concerning for multifocal pneumonia. Upper Abdomen: No acute abnormality. Musculoskeletal: Sternotomy wires are noted. No acute osseous abnormality is noted. Review of the MIP images confirms the above findings. IMPRESSION: 1.  Small filling defect is seen in lower lobe branch of right pulmonary artery consistent with small pulmonary embolus. Critical Value/emergent results were called by telephone at the time of interpretation on 07/13/2019 at 3:21 pm to [place] , who verbally acknowledged these results. 2. Small cardiomegaly. 3. Large bilateral airspace opacities are noted concerning for multifocal pneumonia. 4. Aortic atherosclerosis. Aortic Atherosclerosis (ICD10-I70.0). Electronically Signed   By: Marijo Conception M.D.   On: 07/13/2019 15:22   DG Chest Portable 1 View  Result Date: 07/13/2019 CLINICAL DATA:  Hypoxemia. Likely COVID. Additional history provided: Patient's wife COVID positive. Generalized weakness, productive cough with yellowish  green phlegm and fever for 2 weeks. EXAM: PORTABLE CHEST 1 VIEW COMPARISON:  Chest radiograph 06/16/2019 FINDINGS: Mild cardiomegaly, unchanged. Prior median sternotomy. Aortic atherosclerosis. Interval development of bilateral ill-defined airspace opacities with a right upper lobe predominance on the right, and upper to mid lung field predominance on the left. No evidence of pleural effusion or pneumothorax. No acute bony abnormality. Partially visualized cervical ACDF hardware. IMPRESSION: Interval development of bilateral airspace opacities consistent with multifocal pneumonia. Cardiomegaly, unchanged Electronically Signed   By: Kellie Simmering DO   On: 07/13/2019 11:13

## 2019-07-17 LAB — CBC
HCT: 35.4 % — ABNORMAL LOW (ref 39.0–52.0)
Hemoglobin: 11.5 g/dL — ABNORMAL LOW (ref 13.0–17.0)
MCH: 30.3 pg (ref 26.0–34.0)
MCHC: 32.5 g/dL (ref 30.0–36.0)
MCV: 93.2 fL (ref 80.0–100.0)
Platelets: 214 10*3/uL (ref 150–400)
RBC: 3.8 MIL/uL — ABNORMAL LOW (ref 4.22–5.81)
RDW: 14.1 % (ref 11.5–15.5)
WBC: 8.8 10*3/uL (ref 4.0–10.5)
nRBC: 0 % (ref 0.0–0.2)

## 2019-07-17 LAB — HEPARIN LEVEL (UNFRACTIONATED): Heparin Unfractionated: 0.34 IU/mL (ref 0.30–0.70)

## 2019-07-17 LAB — PHOSPHORUS: Phosphorus: 3.8 mg/dL (ref 2.5–4.6)

## 2019-07-17 LAB — COMPREHENSIVE METABOLIC PANEL
ALT: 192 U/L — ABNORMAL HIGH (ref 0–44)
AST: 112 U/L — ABNORMAL HIGH (ref 15–41)
Albumin: 2.4 g/dL — ABNORMAL LOW (ref 3.5–5.0)
Alkaline Phosphatase: 63 U/L (ref 38–126)
Anion gap: 8 (ref 5–15)
BUN: 23 mg/dL (ref 8–23)
CO2: 27 mmol/L (ref 22–32)
Calcium: 7.9 mg/dL — ABNORMAL LOW (ref 8.9–10.3)
Chloride: 103 mmol/L (ref 98–111)
Creatinine, Ser: 0.85 mg/dL (ref 0.61–1.24)
GFR calc Af Amer: >60 mL/min (ref >60)
GFR calc non Af Amer: >60 mL/min (ref >60)
Glucose, Bld: 135 mg/dL — ABNORMAL HIGH (ref 70–99)
Potassium: 3.9 mmol/L (ref 3.5–5.1)
Sodium: 138 mmol/L (ref 135–145)
Total Bilirubin: 0.5 mg/dL (ref 0.3–1.2)
Total Protein: 5.8 g/dL — ABNORMAL LOW (ref 6.5–8.1)

## 2019-07-17 LAB — D-DIMER, QUANTITATIVE
D-Dimer, Quant: 19.94 ug/mL-FEU — ABNORMAL HIGH (ref 0.00–0.50)
D-Dimer, Quant: 19.81 ug/mL-FEU — ABNORMAL HIGH (ref 0.00–0.50)

## 2019-07-17 LAB — MAGNESIUM: Magnesium: 2.2 mg/dL (ref 1.7–2.4)

## 2019-07-17 LAB — PROCALCITONIN: Procalcitonin: 0.13 ng/mL

## 2019-07-17 LAB — C-REACTIVE PROTEIN: CRP: 7.7 mg/dL — ABNORMAL HIGH (ref <1.0)

## 2019-07-17 LAB — FERRITIN: Ferritin: 913 ng/mL — ABNORMAL HIGH (ref 24–336)

## 2019-07-17 MED ORDER — FUROSEMIDE 10 MG/ML IJ SOLN
40.0000 mg | Freq: Once | INTRAMUSCULAR | Status: AC
  Administered 2019-07-17: 40 mg via INTRAVENOUS
  Filled 2019-07-17: qty 4

## 2019-07-17 NOTE — Plan of Care (Signed)
RN updated pt's wife, Wells Guiles, on plan of care. Pt currently up in chair on 6L  satting 94%. OT Lasix ordered for BLE edema. All questions answered. Will continue to monitor.  Problem: Health Behavior/Discharge Planning: Goal: Ability to manage health-related needs will improve Outcome: Progressing   Problem: Clinical Measurements: Goal: Ability to maintain clinical measurements within normal limits will improve Outcome: Progressing Goal: Will remain free from infection Outcome: Progressing Goal: Diagnostic test results will improve Outcome: Progressing Goal: Respiratory complications will improve Outcome: Progressing Goal: Cardiovascular complication will be avoided Outcome: Progressing   Problem: Activity: Goal: Risk for activity intolerance will decrease Outcome: Progressing   Problem: Coping: Goal: Level of anxiety will decrease Outcome: Progressing   Problem: Elimination: Goal: Will not experience complications related to bowel motility Outcome: Progressing Goal: Will not experience complications related to urinary retention Outcome: Progressing   Problem: Pain Managment: Goal: General experience of comfort will improve Outcome: Progressing   Problem: Safety: Goal: Ability to remain free from injury will improve Outcome: Progressing   Problem: Education: Goal: Knowledge of risk factors and measures for prevention of condition will improve Outcome: Progressing   Problem: Coping: Goal: Psychosocial and spiritual needs will be supported Outcome: Progressing   Problem: Respiratory: Goal: Will maintain a patent airway Outcome: Progressing Goal: Complications related to the disease process, condition or treatment will be avoided or minimized Outcome: Progressing

## 2019-07-17 NOTE — Progress Notes (Signed)
PROGRESS NOTE                                                                                                                                                                                                             Patient Demographics:    Matthew Sullivan, is a 74 y.o. male, DOB - [birth year], [id number]  Outpatient Primary MD for the patient is Tonia Ghent, MD   Admit date - 07/13/2019   LOS - 4  Chief Complaint  Patient presents with  . COVID  . Shortness of Breath       Brief Narrative: Patient is a 74 y.o. male with PMHx of ofHTN; HLD; andh/o atrial myxoma on Xareltopresented with  SOB-found to have acute hypoxic resp failure 2/2 COVID PNA and PE.    Subjective:    Matthew Sullivan remains the same.still around on 10L of O2. Continues to have irritation at rectum.from his hemorrhoids...some mild hematochezia today   Assessment  & Plan :   Acute Hypoxic Resp Failure due to Covid 19 Viral pneumonia, concurrent bacterial pneumonia and pulmonary embolism: although improved, O2 requirements have plateaued around 10L. Continue Remdesivir, steroids, IV Abx, and anticoagulation  Fever: afebrile  O2 requirements:  SpO2: 95 % O2 Flow Rate (L/min): 6 L/min   COVID-19 Labs: Recent Labs    07/15/19 0936 07/16/19 0711 07/16/19 1504 07/17/19 0450  DDIMER >20.00* >20.00* >20.00* 19.94*  FERRITIN 1,248* 1,087*  --  913*  CRP 17.9* 10.0*  --  7.7*       Component Value Date/Time   BNP 566.7 (H) 07/13/2019 1119    Recent Labs  Lab 07/14/19 0849 07/15/19 0936 07/16/19 0711 07/17/19 0450  PROCALCITON 1.08 0.65 0.42 0.13    Lab Results  Component Value Date   SARSCOV2NAA POSITIVE (A) 07/13/2019     COVID-19 Medications: Steroids:1/3>> Remdesivir:1/3>>1/7 Convalescent Plasma:1/4 x 1  Other medications: Diuretics:Euvolemic-one dose of Lasix 40 mg IV to maintain neg  balance Antibiotics: Rocephin: 1/3>> Zithromax:1/3>>1/6 CBG's stable on SSI while on steroid (if no hx of DM)  Prone/Incentive Spirometry: encouraged incentive spirometry use 3-4/hour.  DVT Prophylaxis  :  Lovenox   Acute [id number] branch and right lower lobe.On Xarelto at home, he has not been very compliant-likely PE provoked from COVID 19 and non compliance to xarelto. Suspect that this is not Xarelto failure.  Continue IV heparin for now-when closer to discharge we can switch over to Xarelto.  HTN: controlled-continue lopressor  Transaminitis:2/2 covid-slightly worse today, last dose of remdesivir today...follow  HLD:-hold Lipitor given elevated LFT's  H/o atrial myxoma/PAF:-hx of myxoma removed in 2014 and brief period of post-op afib that resolved after electrical cardioversion later that year. On anticoagulation-see above  Hemorrhoidal bleed:improved-continue supportive care with anusol suppository. Continues to have minimal hematochezia  Nutrition Problem: Nutrition Problem: Increased nutrient needs Etiology: acute illness(COVID-19) Signs/Symptoms: estimated needs Interventions: Ensure Enlive (each supplement provides 350kcal and 20 grams of protein), Magic cup  Obesity: Estimated body mass index is 30.5 kg/m as calculated from the following:   Height as of this encounter: 6' (1.829 m).   Weight as of this encounter: 102 kg.    Consults  :  None  Procedures  :  None  ABG:    Component Value Date/Time   PHART 7.322 (L) 04/09/2013 1924   PCO2ART 45.2 (H) 04/09/2013 1924   PO2ART 110.0 (H) 04/09/2013 1924   HCO3 23.1 04/09/2013 1924   TCO2 24 04/10/2013 1635   ACIDBASEDEF 3.0 (H) 04/09/2013 1924   O2SAT 97.0 04/09/2013 1924    Vent Settings: N/A  Condition - Extremely Guarded  Family Communication  :  Spouse over the phone on 1/7  Code Status :  Full Code  Diet :  Diet Order            Diet regular Room service appropriate? Yes; Fluid consistency:  Thin  Diet effective now               Disposition Plan  :  Remain hospitalized  Barriers to discharge: Hypoxia requiring O2 supplementation/complete 5 days of IV Remdesivir  Antimicorbials  :    Anti-infectives (From admission, onward)   Start     Dose/Rate Route Frequency Ordered Stop   07/14/19 1330  azithromycin (ZITHROMAX) 500 mg in sodium chloride 0.9 % 250 mL IVPB     500 mg 250 mL/hr over 60 Minutes Intravenous Every 24 hours 07/13/19 1824     07/14/19 1300  cefTRIAXone (ROCEPHIN) 1 g in sodium chloride 0.9 % 100 mL IVPB     1 g 200 mL/hr over 30 Minutes Intravenous Every 24 hours 07/13/19 1824     07/14/19 1000  remdesivir 100 mg in sodium chloride 0.9 % 100 mL IVPB     100 mg 200 mL/hr over 30 Minutes Intravenous Daily 07/13/19 1615 07/17/19 0845   07/13/19 1630  remdesivir 200 mg in sodium chloride 0.9% 250 mL IVPB     200 mg 580 mL/hr over 30 Minutes Intravenous Once 07/13/19 1615 07/13/19 1806   07/13/19 1115  cefTRIAXone (ROCEPHIN) 1 g in sodium chloride 0.9 % 100 mL IVPB     1 g 200 mL/hr over 30 Minutes Intravenous  Once 07/13/19 1111 07/13/19 1340   07/13/19 1115  azithromycin (ZITHROMAX) 500 mg in sodium chloride 0.9 % 250 mL IVPB     500 mg 250 mL/hr over 60 Minutes Intravenous  Once 07/13/19 1111 07/13/19 1515      Inpatient Medications  Scheduled Meds: . atorvastatin  40 mg Oral QHS  . dexamethasone (DECADRON) injection  10 mg Intravenous Q24H  . feeding supplement (ENSURE ENLIVE)  237 mL Oral TID BM  . metoprolol tartrate  12.5 mg Oral BID  . sodium chloride flush  3 mL Intravenous Q12H  . sodium chloride flush  3 mL Intravenous Q12H   Continuous Infusions: .  sodium chloride    . azithromycin (ZITHROMAX) 500 MG IVPB (Vial-Mate [employer]or) 500 mg (07/16/19 1326)  . cefTRIAXone (ROCEPHIN)  IV 1 g (07/16/19 1220)  . heparin 1,450 Units/hr (07/17/19 0000)   PRN Meds:.sodium chloride, acetaminophen, albuterol, bisacodyl, chlorpheniramine-HYDROcodone,  guaiFENesin-dextromethorphan, hydrocortisone, ondansetron **OR** ondansetron (ZOFRAN) IV, oxyCODONE, polyethylene glycol, sodium chloride flush, sodium phosphate, witch hazel-glycerin   Time Spent in minutes  25   See all Orders from today for further details   Oren Binet M.D on 07/17/2019 at 11:15 AM  To page go to [email or web address] - use universal password  [facility] -  Office  [phone]    Objective:   Vitals:   07/16/19 2358 07/17/19 0440 07/17/19 0754 07/17/19 1000  BP: 131/82 113/73 119/63   Pulse: 61 (!) 51 79   Resp: 18 16 16    Temp: 98.3 F (36.8 C) 98.4 F (36.9 C) 97.7 F (36.5 C)   TempSrc: Oral Oral Oral   SpO2: 96% 100% 95% 95%  Weight:      Height:        Wt Readings from Last 3 Encounters:  07/13/19 102 kg  06/20/19 98.9 kg  04/22/19 102 kg     Intake/Output Summary (Last 24 hours) at 07/17/2019 1115 Last data filed at 07/17/2019 1000 Gross per 24 hour  Intake 220 ml  Output 1700 ml  Net -1480 ml     Physical Exam Gen Exam:Alert awake-not in any distress HEENT:atraumatic, normocephalic Chest: B/L clear to auscultation anteriorly CVS:S1S2 regular Abdomen:soft non tender, non distended Extremities:no edema Neurology: Non focal Skin: no rash   Data Review:    CBC Recent Labs  Lab 07/13/19 1119 07/14/19 0849 07/15/19 0936 07/16/19 0711 07/17/19 0450  WBC 6.1 10.6* 15.8* 10.2 8.8  HGB 12.0* 12.0* 12.2* 11.7* 11.5*  HCT 36.2* 37.3* 37.4* 36.5* 35.4*  PLT 141* 160 232 210 214  MCV 93.5 93.3 94.2 95.3 93.2  MCH 31.0 30.0 30.7 30.5 30.3  MCHC 33.1 32.2 32.6 32.1 32.5  RDW 14.1 14.0 14.3 14.3 14.1  LYMPHSABS 0.6*  --   --   --   --   MONOABS 0.2  --   --   --   --   EOSABS 0.0  --   --   --   --   BASOSABS 0.0  --   --   --   --     Chemistries  Recent Labs  Lab 07/13/19 1021 07/13/19 1119 07/14/19 0849 07/15/19 0936 07/16/19 0711 07/17/19 0450  NA 131*  --  140 141 141 138  K 3.0*  --  3.3* 3.8 4.1 3.9  CL 94*   --  99 101 100 103  CO2 22  --  29 28 29 27   GLUCOSE 192*  --  178* 194* 157* 135*  BUN 30*  --  29* 30* 27* 23  CREATININE 1.41*  --  1.06 0.91 0.73 0.85  CALCIUM 7.8*  --  8.1* 8.5* 8.1* 7.9*  MG  --   --  2.8* 2.6* 2.3 2.2  AST  --  120* 111* 86* 77* 112*  ALT  --  104* 128* 146* 141* 192*  ALKPHOS  --  67 81 82 78 63  BILITOT  --  1.1 0.7 0.9 1.0 0.5   ------------------------------------------------------------------------------------------------------------------ No results for input(s): CHOL, HDL, LDLCALC, TRIG, CHOLHDL, LDLDIRECT in the last 72 hours.  Lab Results  Component Value Date   HGBA1C 5.6 04/09/2013   ------------------------------------------------------------------------------------------------------------------ No results for  input(s): TSH, T4TOTAL, T3FREE, THYROIDAB in the last 72 hours.  Invalid input(s): FREET3 ------------------------------------------------------------------------------------------------------------------ Recent Labs    07/16/19 0711 07/17/19 0450  FERRITIN 1,087* 913*    Coagulation profile No results for input(s): INR, PROTIME in the last 168 hours.  Recent Labs    07/16/19 1504 07/17/19 0450  DDIMER >20.00* 19.94*    Cardiac Enzymes No results for input(s): CKMB, TROPONINI, MYOGLOBIN in the last 168 hours.  Invalid input(s): CK ------------------------------------------------------------------------------------------------------------------    Component Value Date/Time   BNP 566.7 (H) 07/13/2019 1119    Micro Results Recent Results (from the past 240 hour(s))  Culture, blood (Routine X 2) w Reflex to ID Panel     Status: None (Preliminary result)   Collection Time: 07/13/19 11:40 AM   Specimen: BLOOD  Result Value Ref Range Status   Specimen Description BLOOD RIGHT ANTECUBITAL  Final   Special Requests   Final    BOTTLES DRAWN AEROBIC AND ANAEROBIC Blood Culture results may not be optimal due to an inadequate  volume of blood received in culture bottles   Culture   Final    NO GROWTH 3 DAYS Performed at [facility], [place] [address] [place], [place] [address]    Report Status PENDING  Incomplete  Culture, blood (Routine X 2) w Reflex to ID Panel     Status: None (Preliminary result)   Collection Time: 07/13/19 11:50 AM   Specimen: BLOOD  Result Value Ref Range Status   Specimen Description BLOOD LEFT ANTECUBITAL  Final   Special Requests   Final    BOTTLES DRAWN AEROBIC AND ANAEROBIC Blood Culture results may not be optimal due to an inadequate volume of blood received in culture bottles   Culture   Final    NO GROWTH 3 DAYS Performed at [facility], [place] [address] [place], [place] [address]    Report Status PENDING  Incomplete  Respiratory Panel by RT PCR (Flu A&B, Covid) - Nasopharyngeal Swab     Status: Abnormal   Collection Time: 07/13/19 11:50 AM   Specimen: Nasopharyngeal Swab  Result Value Ref Range Status   SARS Coronavirus 2 by RT PCR POSITIVE (A) NEGATIVE Final    Comment: RESULT CALLED TO, READ BACK BY AND VERIFIED WITH: MCKWON ON [id number] AT [id number] BY NFIELDS (NOTE) SARS-CoV-2 target nucleic acids are DETECTED. SARS-CoV-2 RNA is generally detectable in upper respiratory specimens  during the acute phase of infection. Positive results are indicative of the presence of the identified virus, but do not rule out bacterial infection or co-infection with other pathogens not detected by the test. Clinical correlation with patient history and other diagnostic information is necessary to determine patient infection status. The expected result is Negative. Fact Sheet for Patients:  PinkCheek.be Fact Sheet for Healthcare Providers: GravelBags.it This test is not yet approved or cleared by the [place] FDA and  has been authorized for detection and/or diagnosis of SARS-CoV-2 by FDA under an Emergency Use  Authorization (EUA).  This EUA will remain in effect (meaning this test can be used) f or the duration of  the COVID-19 declaration under Section 564(b)(1) of the Act, 21 U.S.C. section 360bbb-3(b)(1), unless the authorization is terminated or revoked sooner.    Influenza A by PCR NEGATIVE NEGATIVE Final   Influenza B by PCR NEGATIVE NEGATIVE Final    Comment: (NOTE) The Xpert Xpress SARS-CoV-2/FLU/RSV assay is intended as an aid in  the diagnosis of influenza from Nasopharyngeal swab specimens and  should not be used as a sole basis for treatment. Nasal washings and  aspirates are unacceptable for Xpert Xpress SARS-CoV-2/FLU/RSV  testing. Fact Sheet for Patients: PinkCheek.be Fact Sheet for Healthcare Providers: GravelBags.it This test is not yet approved or cleared by the [place] FDA and  has been authorized for detection and/or diagnosis of SARS-CoV-2 by  FDA under an Emergency Use Authorization (EUA). This EUA will remain  in effect (meaning this test can be used) for the duration of the  Covid-19 declaration under Section 564(b)(1) of the Act, 21  U.S.C. section 360bbb-3(b)(1), unless the authorization is  terminated or revoked. Performed at [facility], [place] [address] [place], [place] [address]     Radiology Reports CT Angio Chest PE W and/or Wo Contrast  Result Date: 07/13/2019 CLINICAL DATA:  Shortness of breath. Productive cough. EXAM: CT ANGIOGRAPHY CHEST WITH CONTRAST TECHNIQUE: Multidetector CT imaging of the chest was performed using the standard protocol during bolus administration of intravenous contrast. Multiplanar CT image reconstructions and MIPs were obtained to evaluate the vascular anatomy. CONTRAST:  27mL OMNIPAQUE IOHEXOL 350 MG/ML SOLN COMPARISON:  Radiograph of same day. FINDINGS: Cardiovascular: Small filling defect is seen in lower lobe branch of right pulmonary artery consistent with  small pulmonary embolus. Small cardiomegaly is noted. No pericardial effusion is noted. Atherosclerosis of thoracic aorta is noted without aneurysm formation. Mediastinum/Nodes: No enlarged mediastinal, hilar, or axillary lymph nodes. Thyroid gland, trachea, and esophagus demonstrate no significant findings. Lungs/Pleura: No pneumothorax or pleural effusion is noted. Large bilateral airspace opacities are noted concerning for multifocal pneumonia. Upper Abdomen: No acute abnormality. Musculoskeletal: Sternotomy wires are noted. No acute osseous abnormality is noted. Review of the MIP images confirms the above findings. IMPRESSION: 1. Small filling defect is seen in lower lobe branch of right pulmonary artery consistent with small pulmonary embolus. Critical Value/emergent results were called by telephone at the time of interpretation on 07/13/2019 at 3:21 pm to [place] , who verbally acknowledged these results. 2. Small cardiomegaly. 3. Large bilateral airspace opacities are noted concerning for multifocal pneumonia. 4. Aortic atherosclerosis. Aortic Atherosclerosis (ICD10-I70.0). Electronically Signed   By: Marijo Conception M.D.   On: 07/13/2019 15:22   DG Chest Portable 1 View  Result Date: 07/13/2019 CLINICAL DATA:  Hypoxemia. Likely COVID. Additional history provided: Patient's wife COVID positive. Generalized weakness, productive cough with yellowish green phlegm and fever for 2 weeks. EXAM: PORTABLE CHEST 1 VIEW COMPARISON:  Chest radiograph 06/16/2019 FINDINGS: Mild cardiomegaly, unchanged. Prior median sternotomy. Aortic atherosclerosis. Interval development of bilateral ill-defined airspace opacities with a right upper lobe predominance on the right, and upper to mid lung field predominance on the left. No evidence of pleural effusion or pneumothorax. No acute bony abnormality. Partially visualized cervical ACDF hardware. IMPRESSION: Interval development of bilateral airspace opacities  consistent with multifocal pneumonia. Cardiomegaly, unchanged Electronically Signed   By: Kellie Simmering DO   On: 07/13/2019 11:13

## 2019-07-17 NOTE — Progress Notes (Signed)
ANTICOAGULATION CONSULT NOTE --Follow-Up Consult  Pharmacy Consult for Heparin Indication: pulmonary embolus  No Known Allergies  Patient Measurements: Height: 6' (182.9 cm) Weight: 224 lb 13.9 oz (102 kg) IBW/kg (Calculated) : 77.6 Heparin Dosing Weight: 98.5 kg  Vital Signs: Temp: 97.7 F (36.5 C) (01/07 0754) Temp Source: Oral (01/07 0754) BP: 119/63 (01/07 0754) Pulse Rate: 79 (01/07 0754)  Labs: Recent Labs    07/15/19 0936 07/16/19 0711 07/16/19 1504 07/17/19 0450  HGB 12.2* 11.7*  --  11.5*  HCT 37.4* 36.5*  --  35.4*  PLT 232 210  --  214  HEPARINUNFRC 0.44 0.39 0.37 0.34  CREATININE 0.91 0.73  --  0.85    Estimated Creatinine Clearance: 92.8 mL/min (by C-G formula based on SCr of 0.85 mg/dL).   Assessment: On heparin gtt for PE. Confirmed on CT. Was on Xarelto PTA (unsure if compliance issue vs treatment failure) Heparin level therapeutic this AM at 0.34  Goal of Therapy:  Heparin level 0.3-0.7 units/ml Monitor platelets by anticoagulation protocol: Yes   Plan:  Continue heparin drip at 1,450 units/hr Monitor daily heparin level, CBC, s/s of bleed  Kahari Critzer A. Levada Dy, PharmD, BCPS, FNKF Clinical Pharmacist [place] Please utilize Amion for appropriate phone number to reach the unit pharmacist ([place])   07/17/2019 8:27 AM

## 2019-07-18 LAB — COMPREHENSIVE METABOLIC PANEL
ALT: 196 U/L — ABNORMAL HIGH (ref 0–44)
AST: 76 U/L — ABNORMAL HIGH (ref 15–41)
Albumin: 2.5 g/dL — ABNORMAL LOW (ref 3.5–5.0)
Alkaline Phosphatase: 62 U/L (ref 38–126)
Anion gap: 9 (ref 5–15)
BUN: 23 mg/dL (ref 8–23)
CO2: 29 mmol/L (ref 22–32)
Calcium: 8.2 mg/dL — ABNORMAL LOW (ref 8.9–10.3)
Chloride: 101 mmol/L (ref 98–111)
Creatinine, Ser: 0.88 mg/dL (ref 0.61–1.24)
GFR calc Af Amer: >60 mL/min (ref >60)
GFR calc non Af Amer: >60 mL/min (ref >60)
Glucose, Bld: 154 mg/dL — ABNORMAL HIGH (ref 70–99)
Potassium: 4 mmol/L (ref 3.5–5.1)
Sodium: 139 mmol/L (ref 135–145)
Total Bilirubin: 0.5 mg/dL (ref 0.3–1.2)
Total Protein: 5.7 g/dL — ABNORMAL LOW (ref 6.5–8.1)

## 2019-07-18 LAB — CULTURE, BLOOD (ROUTINE X 2)
Culture: NO GROWTH
Culture: NO GROWTH

## 2019-07-18 LAB — CBC
HCT: 35.8 % — ABNORMAL LOW (ref 39.0–52.0)
Hemoglobin: 11.5 g/dL — ABNORMAL LOW (ref 13.0–17.0)
MCH: 30 pg (ref 26.0–34.0)
MCHC: 32.1 g/dL (ref 30.0–36.0)
MCV: 93.5 fL (ref 80.0–100.0)
Platelets: 208 10*3/uL (ref 150–400)
RBC: 3.83 MIL/uL — ABNORMAL LOW (ref 4.22–5.81)
RDW: 14.4 % (ref 11.5–15.5)
WBC: 9.2 10*3/uL (ref 4.0–10.5)
nRBC: 0.2 % (ref 0.0–0.2)

## 2019-07-18 LAB — PHOSPHORUS: Phosphorus: 4.2 mg/dL (ref 2.5–4.6)

## 2019-07-18 LAB — HEPARIN LEVEL (UNFRACTIONATED): Heparin Unfractionated: 0.42 IU/mL (ref 0.30–0.70)

## 2019-07-18 LAB — FERRITIN: Ferritin: 789 ng/mL — ABNORMAL HIGH (ref 24–336)

## 2019-07-18 LAB — MAGNESIUM: Magnesium: 2.1 mg/dL (ref 1.7–2.4)

## 2019-07-18 LAB — C-REACTIVE PROTEIN: CRP: 5 mg/dL — ABNORMAL HIGH (ref <1.0)

## 2019-07-18 LAB — D-DIMER, QUANTITATIVE: D-Dimer, Quant: 17.75 ug/mL-FEU — ABNORMAL HIGH (ref 0.00–0.50)

## 2019-07-18 MED ORDER — FUROSEMIDE 10 MG/ML IJ SOLN
40.0000 mg | Freq: Once | INTRAMUSCULAR | Status: AC
  Administered 2019-07-18: 40 mg via INTRAVENOUS
  Filled 2019-07-18: qty 4

## 2019-07-18 NOTE — Progress Notes (Signed)
Pt stable with no signs of distress. Report given to oncoming nurse. Safety maintained.  

## 2019-07-18 NOTE — Progress Notes (Deleted)
[facility] arrived to transport pt to [facility] for procedure. VSS and documented prior to departure. Pt requested glasses and cellphone be left at bedside.

## 2019-07-18 NOTE — Evaluation (Signed)
Physical Therapy Evaluation Patient Details Name: DYAMI MAMONE MRN: [id number] DOB: 02/06/1946 Today's Date: 07/18/2019   History of Present Illness  Pt is a 74 y.o. male admitted 07/13/19 with SOB; found to have acute hypoxic respiratory failure secondary to COVID PNA and PE; pt on xarelto at home but not fully compliant. Being treated with IV heparin. Pt with some mild hematochezia and hemorrhoids. Other PMH includes HTN, PAF, obesity, TIA, cervical OA, melanoma.    Clinical Impression  Pt presents with an overall decrease in functional mobility secondary to above. PTA, pt independent, works as Chief Strategy Officer, and lives with wife who is currently recovering from [employer]; other supportive family members nearby. Today, pt able to ambulate short distance with RW and intermittent min guard for balance. SpO2 down to 82% on 3L requiring prolonged seated rest break to recover to >/88%. Pt with frequent, non-productive cough, especially when performing IS/flutter valve. Pt reports unable to tolerate laying prone, understands importance of sitting in recliner and OOB mobility. Pt would benefit from continued acute PT services to maximize functional mobility and independence prior to d/c with HHPT services.     Follow Up Recommendations Home health PT;Supervision for mobility/OOB(pt may decline)    Equipment Recommendations  None recommended by PT    Recommendations for Other Services       Precautions / Restrictions Precautions Precautions: Fall;Other (comment) Precaution Comments: Watch SpO2 Restrictions Weight Bearing Restrictions: No      Mobility  Bed Mobility               General bed mobility comments: Received sitting in recliner  Transfers Overall transfer level: Needs assistance Equipment used: None;Rolling walker (2 wheeled) Transfers: Sit to/from Stand Sit to Stand: Min guard;Supervision         General transfer comment: Initial standing without DME, min guard for  balance and pt reaching for UE support on IV pole. Subsequent trials with RW, supervision  Ambulation/Gait Ambulation/Gait assistance: Min guard;Supervision Gait Distance (Feet): 30 Feet Assistive device: None;Rolling walker (2 wheeled) Gait Pattern/deviations: Step-through pattern;Decreased stride length;Trunk flexed   Gait velocity interpretation: <1.8 ft/sec, indicate of risk for recurrent falls General Gait Details: Amb forwards/backwards 2x trials without DME, pt reaching to IV pole for balance; additional amb in room with RW, stability improved, intermittent min guard for balance. SpO2 down to 82% on 3L O2, pt requiring prolonged seated rest to recover to >/88% on 3L  Stairs            Wheelchair Mobility    Modified Rankin (Stroke Patients Only)       Balance Overall balance assessment: Needs assistance   Sitting balance-Leahy Scale: Good       Standing balance-Leahy Scale: Fair Standing balance comment: Can static stand without UE support; dynamic stability improved with RW                             Pertinent Vitals/Pain Pain Assessment: Faces Faces Pain Scale: Hurts a little bit Pain Location: Rectum (hemorrhoids) Pain Descriptors / Indicators: Constant Pain Intervention(s): Monitored during session    Home Living Family/patient expects to be discharged to:: Private residence Living Arrangements: Spouse/significant other Available Help at Discharge: Family;Available PRN/intermittently Type of Home: House Home Access: Stairs to enter Entrance Stairs-Rails: Right Entrance Stairs-Number of Steps: 4 Home Layout: One level Home Equipment: Walker - 2 wheels;Shower seat Additional Comments: Wife recovering from [place] (was not admitted); other family nearby but pt  does not want to expose them to COVID    Prior Function Level of Independence: Independent         Comments: 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 Drives     Hand Dominance         Extremity/Trunk Assessment   Upper Extremity Assessment Upper Extremity Assessment: Overall WFL for tasks assessed    Lower Extremity Assessment Lower Extremity Assessment: Generalized weakness       Communication   Communication: HOH  Cognition Arousal/Alertness: Awake/alert Behavior During Therapy: WFL for tasks assessed/performed Overall Cognitive Status: Within Functional Limits for tasks assessed                                 General Comments: WFL for simple tasks, not formally assessed. Pt aware he is internally distracted, "My mind is racing with all of this"      General Comments      Exercises General Exercises - Lower Extremity Ankle Circles/Pumps: AROM;Seated Long Arc Quad: AROM;Both;Seated Hip Flexion/Marching: AROM;Both;Seated Other Exercises Other Exercises: Able to demonstrate a few trials of IS and flutter valve with good technique, both cause significant non-productive cough with SpO2 down to 83%   Assessment/Plan    PT Assessment Patient needs continued PT services  PT Problem List Decreased strength;Decreased activity tolerance;Decreased balance;Decreased mobility;Decreased knowledge of use of DME;Cardiopulmonary status limiting activity       PT Treatment Interventions DME instruction;Gait training;Stair training;Functional mobility training;Therapeutic activities;Therapeutic exercise;Balance training;Patient/family education    PT Goals (Current goals can be found in the Care Plan section)  Acute Rehab PT Goals Patient Stated Goal: "Go home and do the best we can because I don't want to expose my family to [place]" PT Goal Formulation: With patient Time For Goal Achievement: 08/01/19 Potential to Achieve Goals: Good    Frequency Min 3X/week   Barriers to discharge        Co-evaluation               AM-PAC PT "6 Clicks" Mobility  Outcome Measure Help needed turning from your back to your side while in a flat bed  without using bedrails?: None Help needed moving from lying on your back to sitting on the side of a flat bed without using bedrails?: None Help needed moving to and from a bed to a chair (including a wheelchair)?: A Little Help needed standing up from a chair using your arms (e.g., wheelchair or bedside chair)?: A Little Help needed to walk in hospital room?: A Little Help needed climbing 3-5 steps with a railing? : A Little 6 Click Score: 20    End of Session Equipment Utilized During Treatment: Oxygen Activity Tolerance: Patient tolerated treatment well Patient left: in chair;with call bell/phone within reach Nurse Communication: Mobility status PT Visit Diagnosis: Other abnormalities of gait and mobility (R26.89);Muscle weakness (generalized) (M62.81)    Time: [id number] PT Time Calculation (min) (ACUTE ONLY): 27 min   Charges:   PT Evaluation $PT Eval Moderate Complexity: 1 Mod PT Treatments $Therapeutic Exercise: 8-22 mins      Mabeline Caras, PT, DPT Acute Rehabilitation Services  Pager [phone] Office [phone]  Derry Lory 07/18/2019, 2:08 PM

## 2019-07-18 NOTE — Plan of Care (Signed)
MD updated pt's wife. O2 weened to 3L [place]. VSS. Pt inquired about suppository, bath, and MB. Miralax, Anusol, and bath offered. Pt declines at this time. OT IV Lasix given. Remdesivir complete. Will continue to monitor.  Problem: Health Behavior/Discharge Planning: Goal: Ability to manage health-related needs will improve Outcome: Progressing   Problem: Clinical Measurements: Goal: Ability to maintain clinical measurements within normal limits will improve Outcome: Progressing Goal: Will remain free from infection Outcome: Progressing Goal: Diagnostic test results will improve Outcome: Progressing Goal: Respiratory complications will improve Outcome: Progressing Goal: Cardiovascular complication will be avoided Outcome: Progressing   Problem: Activity: Goal: Risk for activity intolerance will decrease Outcome: Progressing   Problem: Coping: Goal: Level of anxiety will decrease Outcome: Progressing   Problem: Elimination: Goal: Will not experience complications related to bowel motility Outcome: Progressing Goal: Will not experience complications related to urinary retention Outcome: Progressing   Problem: Pain Managment: Goal: General experience of comfort will improve Outcome: Progressing   Problem: Safety: Goal: Ability to remain free from injury will improve Outcome: Progressing   Problem: Education: Goal: Knowledge of risk factors and measures for prevention of condition will improve Outcome: Progressing   Problem: Coping: Goal: Psychosocial and spiritual needs will be supported Outcome: Progressing   Problem: Respiratory: Goal: Will maintain a patent airway Outcome: Progressing Goal: Complications related to the disease process, condition or treatment will be avoided or minimized Outcome: Progressing

## 2019-07-18 NOTE — Progress Notes (Signed)
ANTICOAGULATION CONSULT NOTE --Follow-Up Consult  Pharmacy Consult for Heparin Indication: pulmonary embolus  No Known Allergies  Patient Measurements: Height: 6' (182.9 cm) Weight: 224 lb 13.9 oz (102 kg) IBW/kg (Calculated) : 77.6 Heparin Dosing Weight: 98.5 kg  Vital Signs: Temp: 97.4 F (36.3 C) (01/08 0726) Temp Source: Oral (01/08 0726) BP: 136/77 (01/08 0726) Pulse Rate: 66 (01/08 0726)  Labs: Recent Labs    07/16/19 0711 07/16/19 1504 07/17/19 0450 07/18/19 0436  HGB 11.7*  --  11.5* 11.5*  HCT 36.5*  --  35.4* 35.8*  PLT 210  --  214 208  HEPARINUNFRC 0.39 0.37 0.34 0.42  CREATININE 0.73  --  0.85 0.88    Estimated Creatinine Clearance: 89.7 mL/min (by C-G formula based on SCr of 0.88 mg/dL).   Assessment: On heparin gtt for PE. Confirmed on CT. Was on Xarelto PTA (unsure if compliance issue vs treatment failure) Last heparin level remains therapeutic. Hgb stable at 11.5, plts wnl.  Goal of Therapy:  Heparin level 0.3-0.7 units/ml Monitor platelets by anticoagulation protocol: Yes   Plan:  Continue heparin drip at 1,450 units/hr Monitor daily heparin level, CBC, s/s of bleed F/u transition to Xarelto closer to discharge  Elenor Quinones, PharmD, BCPS, BCIDP Clinical Pharmacist 07/18/2019 7:49 AM

## 2019-07-18 NOTE — Plan of Care (Signed)
  Problem: Clinical Measurements: Goal: Ability to maintain clinical measurements within normal limits will improve Outcome: Progressing Goal: Will remain free from infection Outcome: Progressing Goal: Diagnostic test results will improve Outcome: Progressing Goal: Respiratory complications will improve Outcome: Progressing Goal: Cardiovascular complication will be avoided Outcome: Progressing   Problem: Activity: Goal: Risk for activity intolerance will decrease Outcome: Progressing   Problem: Coping: Goal: Level of anxiety will decrease Outcome: Progressing   Problem: Safety: Goal: Ability to remain free from injury will improve Outcome: Progressing   Problem: Respiratory: Goal: Will maintain a patent airway Outcome: Progressing Goal: Complications related to the disease process, condition or treatment will be avoided or minimized Outcome: Progressing

## 2019-07-18 NOTE — Progress Notes (Signed)
PROGRESS NOTE                                                                                                                                                                                                             Patient Demographics:    Matthew Sullivan, is a 74 y.o. male, DOB - 22-Aug-1945, [id number]  Outpatient Primary MD for the patient is Tonia Ghent, MD   Admit date - 07/13/2019   LOS - 5  Chief Complaint  Patient presents with  . COVID  . Shortness of Breath       Brief Narrative: Patient is a 74 y.o. male with PMHx of ofHTN; HLD; andh/o atrial myxoma on Xareltopresented with  SOB-found to have acute hypoxic resp failure 2/2 COVID PNA and PE.    Subjective:    [name] r has improved overnight-he is now on 3 L of oxygen at rest.  However with ambulation-even on 3 L he goes down to 82%.   Assessment  & Plan :   Acute Hypoxic Resp Failure due to Covid 19 Viral pneumonia, concurrent bacterial pneumonia and pulmonary embolism: Improved-down to 3 L of oxygen-but still desaturates down to 82% with ambulation.  Pleated a course of remdesivir on 1/7, and has completed a course of Rocephin/Zithromax as well.  Continue steroids-but begin to taper.  Needs further optimization so that oxygen requirements are relatively stable both at rest and with ambulation before consideration of discharge.  Fever: afebrile  O2 requirements:  SpO2: 97 % O2 Flow Rate (L/min): 3 L/min   COVID-19 Labs: Recent Labs    07/16/19 0711 07/17/19 0450 07/17/19 1659 07/18/19 0436  DDIMER >20.00* 19.94* 19.81* 17.75*  FERRITIN 1,087* 913*  --  789*  CRP 10.0* 7.7*  --  5.0*       Component Value Date/Time   BNP 566.7 (H) 07/13/2019 1119    Recent Labs  Lab 07/14/19 0849 07/15/19 0936 07/16/19 0711 07/17/19 0450  PROCALCITON 1.08 0.65 0.42 0.13    Lab Results  Component Value Date   SARSCOV2NAA POSITIVE (A)  07/13/2019     COVID-19 Medications: Steroids:1/3>> Remdesivir:1/3>>1/7 Convalescent Plasma:1/4 x 1  Other medications: Diuretics: Some trace edema today-1 dose of IV Lasix to maintain negative balance. Antibiotics: Rocephin: 1/3>>1/8 Zithromax:1/3>>1/6   Prone/Incentive Spirometry: encouraged incentive spirometry use 3-4/hour.  DVT Prophylaxis  :  Lovenox   Acute [id number] branch and right lower lobe.On Xarelto at home, he has not been very compliant-likely PE provoked from COVID 19 and non compliance to xarelto. Suspect that this is not Xarelto failure. Continue IV heparin for now-when closer to discharge we can switch over to Xarelto.  HTN: controlled-continue lopressor  Transaminitis:2/2 covid-continues to have elevation in ALT-now off remdesivir-follow for now.  HLD:-hold Lipitor given elevated LFT's  H/o atrial myxoma/PAF:-hx of myxoma removed in 2014 and brief period of post-op afib that resolved after electrical cardioversion later that year. On anticoagulation-see above  Hemorrhoidal bleed:improved-continue supportive care with anusol suppository. Continues to have minimal hematochezia  Nutrition Problem: Nutrition Problem: Increased nutrient needs Etiology: acute illness(COVID-19) Signs/Symptoms: estimated needs Interventions: Ensure Enlive (each supplement provides 350kcal and 20 grams of protein), Magic cup  Obesity: Estimated body mass index is 30.5 kg/m as calculated from the following:   Height as of this encounter: 6' (1.829 m).   Weight as of this encounter: 102 kg.    Consults  :  None  Procedures  :  None  ABG:    Component Value Date/Time   PHART 7.322 (L) 04/09/2013 1924   PCO2ART 45.2 (H) 04/09/2013 1924   PO2ART 110.0 (H) 04/09/2013 1924   HCO3 23.1 04/09/2013 1924   TCO2 24 04/10/2013 1635   ACIDBASEDEF 3.0 (H) 04/09/2013 1924   O2SAT 97.0 04/09/2013 1924    Vent Settings: N/A  Condition -  Guarded  Family Communication  :   Spouse over the phone on 1/8  Code Status :  Full Code  Diet :  Diet Order            Diet regular Room service appropriate? Yes; Fluid consistency: Thin  Diet effective now               Disposition Plan  :  Remain hospitalized  Barriers to discharge: Hypoxia requiring O2 supplementation  Antimicorbials  :    Anti-infectives (From admission, onward)   Start     Dose/Rate Route Frequency Ordered Stop   07/14/19 1330  azithromycin (ZITHROMAX) 500 mg in sodium chloride 0.9 % 250 mL IVPB  Status:  Discontinued     500 mg 250 mL/hr over 60 Minutes Intravenous Every 24 hours 07/13/19 1824 07/17/19 1117   07/14/19 1300  cefTRIAXone (ROCEPHIN) 1 g in sodium chloride 0.9 % 100 mL IVPB     1 g 200 mL/hr over 30 Minutes Intravenous Every 24 hours 07/13/19 1824 07/18/19 1215   07/14/19 1000  remdesivir 100 mg in sodium chloride 0.9 % 100 mL IVPB     100 mg 200 mL/hr over 30 Minutes Intravenous Daily 07/13/19 1615 07/17/19 0845   07/13/19 1630  remdesivir 200 mg in sodium chloride 0.9% 250 mL IVPB     200 mg 580 mL/hr over 30 Minutes Intravenous Once 07/13/19 1615 07/13/19 1806   07/13/19 1115  cefTRIAXone (ROCEPHIN) 1 g in sodium chloride 0.9 % 100 mL IVPB     1 g 200 mL/hr over 30 Minutes Intravenous  Once 07/13/19 1111 07/13/19 1340   07/13/19 1115  azithromycin (ZITHROMAX) 500 mg in sodium chloride 0.9 % 250 mL IVPB     500 mg 250 mL/hr over 60 Minutes Intravenous  Once 07/13/19 1111 07/13/19 1515      Inpatient Medications  Scheduled Meds: . dexamethasone (DECADRON) injection  10 mg Intravenous Q24H  . feeding supplement (ENSURE ENLIVE)  237 mL Oral TID BM  . metoprolol tartrate  12.5 mg Oral BID  . sodium chloride flush  3 mL Intravenous Q12H  . sodium chloride flush  3 mL Intravenous Q12H   Continuous Infusions: . sodium chloride    . heparin 1,450 Units/hr (07/18/19 1011)   PRN Meds:.sodium chloride, acetaminophen, albuterol, bisacodyl,  chlorpheniramine-HYDROcodone, guaiFENesin-dextromethorphan, hydrocortisone, ondansetron **OR** ondansetron (ZOFRAN) IV, oxyCODONE, polyethylene glycol, sodium chloride flush, sodium phosphate, witch hazel-glycerin   Time Spent in minutes  25   See all Orders from today for further details   Oren Binet M.D on 07/18/2019 at 3:04 PM  To page go to [email or web address] - use universal password  [facility] -  Office  [phone]    Objective:   Vitals:   07/18/19 0559 07/18/19 0726 07/18/19 1134 07/18/19 1504  BP:  136/77 116/61   Pulse: 71 66 71 77  Resp: 19 15 20 20   Temp:  (!) 97.4 F (36.3 C) (!) 97.3 F (36.3 C)   TempSrc:  Oral Axillary   SpO2: 97% 98% 93% 97%  Weight:      Height:        Wt Readings from Last 3 Encounters:  07/13/19 102 kg  06/20/19 98.9 kg  04/22/19 102 kg     Intake/Output Summary (Last 24 hours) at 07/18/2019 1504 Last data filed at 07/18/2019 1300 Gross per 24 hour  Intake 120 ml  Output 1975 ml  Net -1855 ml     Physical Exam Gen Exam:Alert awake-not in any distress HEENT:atraumatic, normocephalic Chest: B/L clear to auscultation anteriorly CVS:S1S2 regular Abdomen:soft non tender, non distended Extremities:no edema Neurology: Non focal Skin: no rash   Data Review:    CBC Recent Labs  Lab 07/13/19 1119 07/14/19 0849 07/15/19 0936 07/16/19 0711 07/17/19 0450 07/18/19 0436  WBC 6.1 10.6* 15.8* 10.2 8.8 9.2  HGB 12.0* 12.0* 12.2* 11.7* 11.5* 11.5*  HCT 36.2* 37.3* 37.4* 36.5* 35.4* 35.8*  PLT 141* 160 232 210 214 208  MCV 93.5 93.3 94.2 95.3 93.2 93.5  MCH 31.0 30.0 30.7 30.5 30.3 30.0  MCHC 33.1 32.2 32.6 32.1 32.5 32.1  RDW 14.1 14.0 14.3 14.3 14.1 14.4  LYMPHSABS 0.6*  --   --   --   --   --   MONOABS 0.2  --   --   --   --   --   EOSABS 0.0  --   --   --   --   --   BASOSABS 0.0  --   --   --   --   --     Chemistries  Recent Labs  Lab 07/14/19 0849 07/15/19 0936 07/16/19 0711 07/17/19 0450  07/18/19 0436  NA 140 141 141 138 139  K 3.3* 3.8 4.1 3.9 4.0  CL 99 101 100 103 101  CO2 29 28 29 27 29   GLUCOSE 178* 194* 157* 135* 154*  BUN 29* 30* 27* 23 23  CREATININE 1.06 0.91 0.73 0.85 0.88  CALCIUM 8.1* 8.5* 8.1* 7.9* 8.2*  MG 2.8* 2.6* 2.3 2.2 2.1  AST 111* 86* 77* 112* 76*  ALT 128* 146* 141* 192* 196*  ALKPHOS 81 82 78 63 62  BILITOT 0.7 0.9 1.0 0.5 0.5   ------------------------------------------------------------------------------------------------------------------ No results for input(s): CHOL, HDL, LDLCALC, TRIG, CHOLHDL, LDLDIRECT in the last 72 hours.  Lab Results  Component Value Date   HGBA1C 5.6 04/09/2013   ------------------------------------------------------------------------------------------------------------------ No results for input(s): TSH, T4TOTAL, T3FREE, THYROIDAB in the last 72 hours.  Invalid input(s): FREET3 ------------------------------------------------------------------------------------------------------------------ Recent Labs  07/17/19 0450 07/18/19 0436  FERRITIN 913* 789*    Coagulation profile No results for input(s): INR, PROTIME in the last 168 hours.  Recent Labs    07/17/19 1659 07/18/19 0436  DDIMER 19.81* 17.75*    Cardiac Enzymes No results for input(s): CKMB, TROPONINI, MYOGLOBIN in the last 168 hours.  Invalid input(s): CK ------------------------------------------------------------------------------------------------------------------    Component Value Date/Time   BNP 566.7 (H) 07/13/2019 1119    Micro Results Recent Results (from the past 240 hour(s))  Culture, blood (Routine X 2) w Reflex to ID Panel     Status: None   Collection Time: 07/13/19 11:40 AM   Specimen: BLOOD  Result Value Ref Range Status   Specimen Description BLOOD RIGHT ANTECUBITAL  Final   Special Requests   Final    BOTTLES DRAWN AEROBIC AND ANAEROBIC Blood Culture results may not be optimal due to an inadequate volume of  blood received in culture bottles   Culture   Final    NO GROWTH 5 DAYS Performed at [facility], [place] [address], [place], [place] [address]    Report Status 07/18/2019 FINAL  Final  Culture, blood (Routine X 2) w Reflex to ID Panel     Status: None   Collection Time: 07/13/19 11:50 AM   Specimen: BLOOD  Result Value Ref Range Status   Specimen Description BLOOD LEFT ANTECUBITAL  Final   Special Requests   Final    BOTTLES DRAWN AEROBIC AND ANAEROBIC Blood Culture results may not be optimal due to an inadequate volume of blood received in culture bottles   Culture   Final    NO GROWTH 5 DAYS Performed at [facility], [place] [address] [place], [place] [address]    Report Status 07/18/2019 FINAL  Final  Respiratory Panel by RT PCR (Flu A&B, Covid) - Nasopharyngeal Swab     Status: Abnormal   Collection Time: 07/13/19 11:50 AM   Specimen: Nasopharyngeal Swab  Result Value Ref Range Status   SARS Coronavirus 2 by RT PCR POSITIVE (A) NEGATIVE Final    Comment: RESULT CALLED TO, READ BACK BY AND VERIFIED WITH: MCKWON ON [id number] AT [id number] BY NFIELDS (NOTE) SARS-CoV-2 target nucleic acids are DETECTED. SARS-CoV-2 RNA is generally detectable in upper respiratory specimens  during the acute phase of infection. Positive results are indicative of the presence of the identified virus, but do not rule out bacterial infection or co-infection with other pathogens not detected by the test. Clinical correlation with patient history and other diagnostic information is necessary to determine patient infection status. The expected result is Negative. Fact Sheet for Patients:  PinkCheek.be Fact Sheet for Healthcare Providers: GravelBags.it This test is not yet approved or cleared by the [place] FDA and  has been authorized for detection and/or diagnosis of SARS-CoV-2 by FDA under an Emergency Use Authorization (EUA).  This EUA  will remain in effect (meaning this test can be used) f or the duration of  the COVID-19 declaration under Section 564(b)(1) of the Act, 21 U.S.C. section 360bbb-3(b)(1), unless the authorization is terminated or revoked sooner.    Influenza A by PCR NEGATIVE NEGATIVE Final   Influenza B by PCR NEGATIVE NEGATIVE Final    Comment: (NOTE) The Xpert Xpress SARS-CoV-2/FLU/RSV assay is intended as an aid in  the diagnosis of influenza from Nasopharyngeal swab specimens and  should not be used as a sole basis for treatment. Nasal washings and  aspirates are unacceptable for Xpert Xpress SARS-CoV-2/FLU/RSV  testing. Fact Sheet for Patients: PinkCheek.be Fact Sheet for Healthcare Providers: GravelBags.it This test is not yet approved or cleared by the [place] FDA and  has been authorized for detection and/or diagnosis of SARS-CoV-2 by  FDA under an Emergency Use Authorization (EUA). This EUA will remain  in effect (meaning this test can be used) for the duration of the  Covid-19 declaration under Section 564(b)(1) of the Act, 21  U.S.C. section 360bbb-3(b)(1), unless the authorization is  terminated or revoked. Performed at [facility], [place] [address], [place], [place] [address]     Radiology Reports CT Angio Chest PE W and/or Wo Contrast  Result Date: 07/13/2019 CLINICAL DATA:  Shortness of breath. Productive cough. EXAM: CT ANGIOGRAPHY CHEST WITH CONTRAST TECHNIQUE: Multidetector CT imaging of the chest was performed using the standard protocol during bolus administration of intravenous contrast. Multiplanar CT image reconstructions and MIPs were obtained to evaluate the vascular anatomy. CONTRAST:  33mL OMNIPAQUE IOHEXOL 350 MG/ML SOLN COMPARISON:  Radiograph of same day. FINDINGS: Cardiovascular: Small filling defect is seen in lower lobe branch of right pulmonary artery consistent with small pulmonary embolus. Small  cardiomegaly is noted. No pericardial effusion is noted. Atherosclerosis of thoracic aorta is noted without aneurysm formation. Mediastinum/Nodes: No enlarged mediastinal, hilar, or axillary lymph nodes. Thyroid gland, trachea, and esophagus demonstrate no significant findings. Lungs/Pleura: No pneumothorax or pleural effusion is noted. Large bilateral airspace opacities are noted concerning for multifocal pneumonia. Upper Abdomen: No acute abnormality. Musculoskeletal: Sternotomy wires are noted. No acute osseous abnormality is noted. Review of the MIP images confirms the above findings. IMPRESSION: 1. Small filling defect is seen in lower lobe branch of right pulmonary artery consistent with small pulmonary embolus. Critical Value/emergent results were called by telephone at the time of interpretation on 07/13/2019 at 3:21 pm to [place] , who verbally acknowledged these results. 2. Small cardiomegaly. 3. Large bilateral airspace opacities are noted concerning for multifocal pneumonia. 4. Aortic atherosclerosis. Aortic Atherosclerosis (ICD10-I70.0). Electronically Signed   By: Marijo Conception M.D.   On: 07/13/2019 15:22   DG Chest Portable 1 View  Result Date: 07/13/2019 CLINICAL DATA:  Hypoxemia. Likely COVID. Additional history provided: Patient's wife COVID positive. Generalized weakness, productive cough with yellowish green phlegm and fever for 2 weeks. EXAM: PORTABLE CHEST 1 VIEW COMPARISON:  Chest radiograph 06/16/2019 FINDINGS: Mild cardiomegaly, unchanged. Prior median sternotomy. Aortic atherosclerosis. Interval development of bilateral ill-defined airspace opacities with a right upper lobe predominance on the right, and upper to mid lung field predominance on the left. No evidence of pleural effusion or pneumothorax. No acute bony abnormality. Partially visualized cervical ACDF hardware. IMPRESSION: Interval development of bilateral airspace opacities consistent with multifocal pneumonia.  Cardiomegaly, unchanged Electronically Signed   By: Kellie Simmering DO   On: 07/13/2019 11:13

## 2019-07-19 LAB — COMPREHENSIVE METABOLIC PANEL
ALT: 205 U/L — ABNORMAL HIGH (ref 0–44)
AST: 64 U/L — ABNORMAL HIGH (ref 15–41)
Albumin: 2.9 g/dL — ABNORMAL LOW (ref 3.5–5.0)
Alkaline Phosphatase: 60 U/L (ref 38–126)
Anion gap: 11 (ref 5–15)
BUN: 24 mg/dL — ABNORMAL HIGH (ref 8–23)
CO2: 30 mmol/L (ref 22–32)
Calcium: 8.4 mg/dL — ABNORMAL LOW (ref 8.9–10.3)
Chloride: 99 mmol/L (ref 98–111)
Creatinine, Ser: 0.83 mg/dL (ref 0.61–1.24)
GFR calc Af Amer: >60 mL/min (ref >60)
GFR calc non Af Amer: >60 mL/min (ref >60)
Glucose, Bld: 134 mg/dL — ABNORMAL HIGH (ref 70–99)
Potassium: 4.2 mmol/L (ref 3.5–5.1)
Sodium: 140 mmol/L (ref 135–145)
Total Bilirubin: 0.8 mg/dL (ref 0.3–1.2)
Total Protein: 6.1 g/dL — ABNORMAL LOW (ref 6.5–8.1)

## 2019-07-19 LAB — CBC
HCT: 38 % — ABNORMAL LOW (ref 39.0–52.0)
Hemoglobin: 12.1 g/dL — ABNORMAL LOW (ref 13.0–17.0)
MCH: 29.8 pg (ref 26.0–34.0)
MCHC: 31.8 g/dL (ref 30.0–36.0)
MCV: 93.6 fL (ref 80.0–100.0)
Platelets: 235 10*3/uL (ref 150–400)
RBC: 4.06 MIL/uL — ABNORMAL LOW (ref 4.22–5.81)
RDW: 14.4 % (ref 11.5–15.5)
WBC: 10 10*3/uL (ref 4.0–10.5)
nRBC: 0 % (ref 0.0–0.2)

## 2019-07-19 LAB — C-REACTIVE PROTEIN: CRP: 3.7 mg/dL — ABNORMAL HIGH (ref <1.0)

## 2019-07-19 LAB — HEPARIN LEVEL (UNFRACTIONATED)
Heparin Unfractionated: 0.74 IU/mL — ABNORMAL HIGH (ref 0.30–0.70)
Heparin Unfractionated: <0.1 IU/mL — ABNORMAL LOW (ref 0.30–0.70)
Heparin Unfractionated: 0.55 IU/mL (ref 0.30–0.70)

## 2019-07-19 LAB — FERRITIN: Ferritin: 832 ng/mL — ABNORMAL HIGH (ref 24–336)

## 2019-07-19 MED ORDER — FLUTICASONE PROPIONATE 50 MCG/ACT NA SUSP
2.0000 | Freq: Every day | NASAL | Status: DC
  Administered 2019-07-19 – 2019-07-23 (×5): 2 via NASAL
  Filled 2019-07-19: qty 16

## 2019-07-19 MED ORDER — OXYMETAZOLINE HCL 0.05 % NA SOLN
1.0000 | Freq: Two times a day (BID) | NASAL | Status: AC
  Administered 2019-07-19 – 2019-07-21 (×5): 1 via NASAL
  Filled 2019-07-19: qty 15

## 2019-07-19 NOTE — Evaluation (Signed)
Occupational Therapy Evaluation Patient Details Name: Matthew Sullivan MRN: [id number] DOB: 01/16/1946 Today's Date: 07/19/2019    History of Present Illness Pt is a 74 y.o. male admitted 07/13/19 with SOB; found to have acute hypoxic respiratory failure secondary to COVID PNA and PE; pt on xarelto at home but not fully compliant. Being treated with IV heparin. Pt with some mild hematochezia and hemorrhoids. Other PMH includes HTN, PAF, obesity, TIA, cervical OA, melanoma.   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 At time of eval, he is on 3L HFNC with SpO2 87-91% with mobility during session. Pt able to complete transfers without physical assist. He is also at a mod I level for BADLs physically, but has poor activity tolerance and BADL endurance for multistep baseline routines. He is able to stand at the sink for grooming tasks, needing to lean on forearms to conserve energy. DOE 3/4 noted with task. Pt states he had just had a BM which was very fatiguing and need increased recovery time for tolerating more mobility. Education given on self monitoring of spO2 and ECS strategies. Given his current status, no post acute OT f/u expected. OT will continue to follow per POC below while acute for progression of BADL endurance.     Follow Up Recommendations  No OT follow up;Supervision - Intermittent    Equipment Recommendations  3 in 1 bedside commode    Recommendations for Other Services       Precautions / Restrictions Precautions Precautions: Fall;Other (comment) Precaution Comments: Watch SpO2 Restrictions Weight Bearing Restrictions: No      Mobility Bed Mobility               General bed mobility comments: up in chair  Transfers Overall transfer level: Needs assistance Equipment used: None Transfers: Sit to/from Stand Sit to Stand: Supervision         General transfer comment:  supervision for safety and management of lines    Balance Overall balance assessment: Needs assistance   Sitting balance-Leahy Scale: Good     Standing balance support: No upper extremity supported Standing balance-Leahy Scale: Fair                             ADL either performed or assessed with clinical judgement   ADL Overall ADL's : Modified independent                                       General ADL Comments: pt hysically able to complete BADL at mod I level without AD. Pt demonstrated toilet transfer and standing grooming without physical assist. He has deficits in BADL endurance, activity tolerance, and maintaining saturations while implicaiton ECS strategies     Vision Baseline Vision/History: Wears glasses Wears Glasses: At all times Patient Visual Report: No change from baseline       Perception     Praxis      Pertinent Vitals/Pain Pain Assessment: No/denies pain     Hand Dominance     Extremity/Trunk Assessment Upper Extremity Assessment Upper Extremity Assessment: Overall WFL for tasks assessed   Lower Extremity Assessment Lower Extremity Assessment: Defer to PT evaluation       Communication Communication Communication: HOH   Cognition Arousal/Alertness: Awake/alert Behavior During Therapy: [facility] for tasks assessed/performed Overall Cognitive  Status: Within Functional Limits for tasks assessed                                     General Comments       Exercises     Shoulder Instructions      Home Living Family/patient expects to be discharged to:: Private residence Living Arrangements: Spouse/significant other Available Help at Discharge: Family;Available PRN/intermittently Type of Home: House Home Access: Stairs to enter [employer] of Steps: 4 Entrance Stairs-Rails: Right Home Layout: One level     Bathroom Shower/Tub: Occupational psychologist: Handicapped height      Home Equipment: Environmental consultant - 2 wheels;Shower seat   Additional Comments: Wife recovering from [place] (was not admitted); other family nearby but pt does not want to expose them to [place]      Prior Functioning/Environment Level of Independence: Independent        Comments: 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 Drives        OT Problem List: Decreased knowledge of use of DME or AE;Decreased activity tolerance;Cardiopulmonary status limiting activity      OT Treatment/Interventions: Self-care/ADL training;Patient/family education;Therapeutic exercise;Balance training;Energy conservation;Therapeutic activities;DME and/or AE instruction    OT Goals(Current goals can be found in the care plan section) Acute Rehab OT Goals Patient Stated Goal: return to where I was before this began OT Goal Formulation: With patient Time For Goal Achievement: 08/02/19 Potential to Achieve Goals: Good  OT Frequency: Min 2X/week   Barriers to D/C:            Co-evaluation              AM-PAC OT "6 Clicks" Daily Activity     Outcome Measure Help from another person eating meals?: None Help from another person taking care of personal grooming?: None Help from another person toileting, which includes using toliet, bedpan, or urinal?: None Help from another person bathing (including washing, rinsing, drying)?: None Help from another person to put on and taking off regular upper body clothing?: None Help from another person to put on and taking off regular lower body clothing?: None 6 Click Score: 24   End of Session Equipment Utilized During Treatment: Gait belt;Oxygen Nurse Communication: Mobility status  Activity Tolerance: Patient tolerated treatment well Patient left: in chair;with call bell/phone within reach  OT Visit Diagnosis: Unsteadiness on feet (R26.81);Other abnormalities of gait and mobility (R26.89)                Time: [id number] OT Time Calculation (min): 23 min Charges:  OT  General Charges $OT Visit: 1 Visit OT Evaluation $OT Eval Low Complexity: 1 Low OT Treatments $Self Care/Home Management : 8-22 mins  Zenovia Jarred, MSOT, OTR/L Acute Rehabilitation Services [facility] Office Number: [phone]  Zenovia Jarred 07/19/2019, 5:37 PM

## 2019-07-19 NOTE — Progress Notes (Signed)
ANTICOAGULATION CONSULT NOTE --Follow-Up Consult  Pharmacy Consult for Heparin Indication: pulmonary embolus  No Known Allergies  Patient Measurements: Height: 6' (182.9 cm) Weight: 224 lb 13.9 oz (102 kg) IBW/kg (Calculated) : 77.6 Heparin Dosing Weight: 98.5 kg  Vital Signs: Temp: 97.5 F (36.4 C) (01/09 1212) Temp Source: Axillary (01/09 1212) BP: 121/60 (01/09 1212) Pulse Rate: 66 (01/09 1212)  Labs: Recent Labs    07/17/19 0450 07/18/19 0436 07/19/19 0143 07/19/19 1132 07/19/19 1429  HGB 11.5* 11.5* 12.1*  --   --   HCT 35.4* 35.8* 38.0*  --   --   PLT 214 208 235  --   --   HEPARINUNFRC 0.34 0.42 0.74* <0.10* 0.55  CREATININE 0.85 0.88 0.83  --   --     Estimated Creatinine Clearance: 95.1 mL/min (by C-G formula based on SCr of 0.83 mg/dL).   Assessment: On heparin gtt for PE. Confirmed on CT. Was on Xarelto PTA (unsure if compliance issue vs treatment failure).  Heparin level slightly supratherapeutic (0.74) on gtt at 1450 units/hr. CBC stable. No bleeding noted.  Heparin level came back therapeutic this PM. We will continue the same rate and check in AM  Goal of Therapy:  Heparin level 0.3-0.7 units/ml Monitor platelets by anticoagulation protocol: Yes   Plan:  Cont heparin drip to 1350 units/hr Daily heparin level and CBC  Onnie Boer, PharmD, BCIDP, AAHIVP, CPP Infectious Disease Pharmacist 07/19/2019 4:29 PM

## 2019-07-19 NOTE — Progress Notes (Signed)
ANTICOAGULATION CONSULT NOTE --Follow-Up Consult  Pharmacy Consult for Heparin Indication: pulmonary embolus  No Known Allergies  Patient Measurements: Height: 6' (182.9 cm) Weight: 224 lb 13.9 oz (102 kg) IBW/kg (Calculated) : 77.6 Heparin Dosing Weight: 98.5 kg  Vital Signs: Temp: 97 F (36.1 C) (01/09 0200) Temp Source: Temporal (01/09 0200) BP: 126/64 (01/09 0200) Pulse Rate: 75 (01/09 0200)  Labs: Recent Labs    07/16/19 0711 07/17/19 0450 07/18/19 0436 07/19/19 0143  HGB 11.7* 11.5* 11.5* 12.1*  HCT 36.5* 35.4* 35.8* 38.0*  PLT 210 214 208 235  HEPARINUNFRC 0.39 0.34 0.42 0.74*  CREATININE 0.73 0.85 0.88  --     Estimated Creatinine Clearance: 89.7 mL/min (by C-G formula based on SCr of 0.88 mg/dL).   Assessment: On heparin gtt for PE. Confirmed on CT. Was on Xarelto PTA (unsure if compliance issue vs treatment failure).  Heparin level slightly supratherapeutic (0.74) on gtt at 1450 units/hr. CBC stable. No bleeding noted.  Goal of Therapy:  Heparin level 0.3-0.7 units/ml Monitor platelets by anticoagulation protocol: Yes   Plan:  Decrease heparin drip to 1350 units/hr Will f/u 8 hr heparin level  Sherlon Handing, PharmD, BCPS Please see amion for complete clinical pharmacist phone list 07/19/2019 3:22 AM

## 2019-07-19 NOTE — Plan of Care (Signed)
Pt and RN updated wife via phone. Wife inquiring about continued isolation/precautions post discharge. All questions answered. VSS on 3L [place]. Pt c/o constipation. Miralax to be given after breakfast. Pt remains on heparin gtt at 13.10ml/hr. Slight edema in RLE; BLE elevated on pillow. Will continue to monitor.  Problem: Health Behavior/Discharge Planning: Goal: Ability to manage health-related needs will improve Outcome: Progressing   Problem: Clinical Measurements: Goal: Ability to maintain clinical measurements within normal limits will improve Outcome: Progressing Goal: Will remain free from infection Outcome: Progressing Goal: Diagnostic test results will improve Outcome: Progressing Goal: Respiratory complications will improve Outcome: Progressing Goal: Cardiovascular complication will be avoided Outcome: Progressing   Problem: Activity: Goal: Risk for activity intolerance will decrease Outcome: Progressing   Problem: Coping: Goal: Level of anxiety will decrease Outcome: Progressing   Problem: Elimination: Goal: Will not experience complications related to urinary retention Outcome: Progressing   Problem: Pain Managment: Goal: General experience of comfort will improve Outcome: Progressing   Problem: Safety: Goal: Ability to remain free from injury will improve Outcome: Progressing   Problem: Education: Goal: Knowledge of risk factors and measures for prevention of condition will improve Outcome: Progressing   Problem: Coping: Goal: Psychosocial and spiritual needs will be supported Outcome: Progressing   Problem: Respiratory: Goal: Will maintain a patent airway Outcome: Progressing Goal: Complications related to the disease process, condition or treatment will be avoided or minimized Outcome: Progressing   Problem: Elimination: Goal: Will not experience complications related to bowel motility Outcome: Not Progressing

## 2019-07-19 NOTE — Progress Notes (Signed)
PROGRESS NOTE                                                                                                                                                                                                             Patient Demographics:    Matthew Sullivan, is a 74 y.o. male, DOB - Feb 05, 1946, [id number]  Outpatient Primary MD for the patient is Tonia Ghent, MD   Admit date - 07/13/2019   LOS - 6  Chief Complaint  Patient presents with  . COVID  . Shortness of Breath       Brief Narrative: Patient is a 74 y.o. male with PMHx of ofHTN; HLD; andh/o atrial myxoma on Xareltopresented with  SOB-found to have acute hypoxic resp failure 2/2 COVID PNA and PE.    Subjective:    Matthew Sullivan continues to slowly improve-he is stable on 3 L of oxygen.   Assessment  & Plan :   Acute Hypoxic Resp Failure due to Covid 19 Viral pneumonia, concurrent bacterial pneumonia and pulmonary embolism: Slowly improving-down to 3 L of oxygen-but desaturate significantly with minimal activity.  Completed a course of remdesivir on 1/7, also completed a course of antibiotics for concurrent bacterial pneumonia.  Continue tapering steroids.  Needs further optimization so that oxygen requirements are relatively stable both at rest and with ambulation before consideration of discharge.  Fever: afebrile  O2 requirements:  SpO2: 95 % O2 Flow Rate (L/min): 3 L/min   COVID-19 Labs: Recent Labs    07/17/19 0450 07/17/19 1659 07/18/19 0436 07/19/19 0143  DDIMER 19.94* 19.81* 17.75*  --   FERRITIN 913*  --  789* 832*  CRP 7.7*  --  5.0* 3.7*       Component Value Date/Time   BNP 566.7 (H) 07/13/2019 1119    Recent Labs  Lab 07/14/19 0849 07/15/19 0936 07/16/19 0711 07/17/19 0450  PROCALCITON 1.08 0.65 0.42 0.13    Lab Results  Component Value Date   SARSCOV2NAA POSITIVE (A) 07/13/2019     COVID-19  Medications: Steroids:1/3>> Remdesivir:1/3>>1/7 Convalescent Plasma:1/4 x 1  Other medications: Diuretics: Edema-hold Lasix. Antibiotics: Rocephin: 1/3>>1/8 Zithromax:1/3>>1/6   Prone/Incentive Spirometry: encouraged incentive spirometry use 3-4/hour.  DVT Prophylaxis  :  Lovenox   Acute [id number] branch and right lower lobe.On Xarelto at home, he has not been very compliant-likely PE provoked  from COVID 19 and non compliance to xarelto. Suspect that this is not Xarelto failure. Continue IV heparin for now-when closer to discharge we can switch over to Xarelto.  HTN: controlled-continue lopressor  Transaminitis:2/2 covid-continues to have elevation in ALT-now off remdesivir-follow for now.  HLD:-hold Lipitor given elevated LFT's  H/o atrial myxoma/PAF:-hx of myxoma removed in 2014 and brief period of post-op afib that resolved after electrical cardioversion later that year. On anticoagulation-see above  Hemorrhoidal bleed:improved-continue supportive care with anusol suppository. Continues to have minimal hematochezia  Nutrition Problem: Nutrition Problem: Increased nutrient needs Etiology: acute illness(COVID-19) Signs/Symptoms: estimated needs Interventions: Ensure Enlive (each supplement provides 350kcal and 20 grams of protein), Magic cup  Obesity: Estimated body mass index is 30.5 kg/m as calculated from the following:   Height as of this encounter: 6' (1.829 m).   Weight as of this encounter: 102 kg.    Consults  :  None  Procedures  :  None  ABG:    Component Value Date/Time   PHART 7.322 (L) 04/09/2013 1924   PCO2ART 45.2 (H) 04/09/2013 1924   PO2ART 110.0 (H) 04/09/2013 1924   HCO3 23.1 04/09/2013 1924   TCO2 24 04/10/2013 1635   ACIDBASEDEF 3.0 (H) 04/09/2013 1924   O2SAT 97.0 04/09/2013 1924    Vent Settings: N/A  Condition -  Guarded  Family Communication  :  Spouse and son over the phone on 1/9  Code Status :  Full Code  Diet :  Diet  Order            Diet regular Room service appropriate? Yes; Fluid consistency: Thin  Diet effective now               Disposition Plan  :  Remain hospitalized  Barriers to discharge: Hypoxia requiring O2 supplementation  Antimicorbials  :    Anti-infectives (From admission, onward)   Start     Dose/Rate Route Frequency Ordered Stop   07/14/19 1330  azithromycin (ZITHROMAX) 500 mg in sodium chloride 0.9 % 250 mL IVPB  Status:  Discontinued     500 mg 250 mL/hr over 60 Minutes Intravenous Every 24 hours 07/13/19 1824 07/17/19 1117   07/14/19 1300  cefTRIAXone (ROCEPHIN) 1 g in sodium chloride 0.9 % 100 mL IVPB     1 g 200 mL/hr over 30 Minutes Intravenous Every 24 hours 07/13/19 1824 07/18/19 1215   07/14/19 1000  remdesivir 100 mg in sodium chloride 0.9 % 100 mL IVPB     100 mg 200 mL/hr over 30 Minutes Intravenous Daily 07/13/19 1615 07/17/19 0845   07/13/19 1630  remdesivir 200 mg in sodium chloride 0.9% 250 mL IVPB     200 mg 580 mL/hr over 30 Minutes Intravenous Once 07/13/19 1615 07/13/19 1806   07/13/19 1115  cefTRIAXone (ROCEPHIN) 1 g in sodium chloride 0.9 % 100 mL IVPB     1 g 200 mL/hr over 30 Minutes Intravenous  Once 07/13/19 1111 07/13/19 1340   07/13/19 1115  azithromycin (ZITHROMAX) 500 mg in sodium chloride 0.9 % 250 mL IVPB     500 mg 250 mL/hr over 60 Minutes Intravenous  Once 07/13/19 1111 07/13/19 1515      Inpatient Medications  Scheduled Meds: . dexamethasone (DECADRON) injection  10 mg Intravenous Q24H  . feeding supplement (ENSURE ENLIVE)  237 mL Oral TID BM  . fluticasone  2 spray Each Nare Daily  . metoprolol tartrate  12.5 mg Oral BID  . oxymetazoline  1 spray  Each Nare BID  . sodium chloride flush  3 mL Intravenous Q12H  . sodium chloride flush  3 mL Intravenous Q12H   Continuous Infusions: . sodium chloride    . heparin 1,350 Units/hr (07/19/19 0330)   PRN Meds:.sodium chloride, acetaminophen, albuterol, bisacodyl,  chlorpheniramine-HYDROcodone, guaiFENesin-dextromethorphan, hydrocortisone, ondansetron **OR** ondansetron (ZOFRAN) IV, oxyCODONE, polyethylene glycol, sodium chloride flush, sodium phosphate, witch hazel-glycerin   Time Spent in minutes  25   See all Orders from today for further details   Oren Binet M.D on 07/19/2019 at 3:23 PM  To page go to [email or web address] - use universal password  [facility] -  Office  [phone]    Objective:   Vitals:   07/19/19 0200 07/19/19 0538 07/19/19 0800 07/19/19 1212  BP: 126/64 127/70 134/79 121/60  Pulse: 75  71 66  Resp: 18 19 18 18   Temp: (!) 97 F (36.1 C) (!) 97.2 F (36.2 C) (!) 97.5 F (36.4 C) (!) 97.5 F (36.4 C)  TempSrc: Axillary Axillary Oral Axillary  SpO2: 93% 91% 94% 95%  Weight:      Height:        Wt Readings from Last 3 Encounters:  07/13/19 102 kg  06/20/19 98.9 kg  04/22/19 102 kg     Intake/Output Summary (Last 24 hours) at 07/19/2019 1523 Last data filed at 07/19/2019 1200 Gross per 24 hour  Intake 600 ml  Output 2175 ml  Net -1575 ml     Physical Exam Gen Exam:Alert awake-not in any distress HEENT:atraumatic, normocephalic Chest: B/L clear to auscultation anteriorly CVS:S1S2 regular Abdomen:soft non tender, non distended Extremities:no edema Neurology: Non focal Skin: no rash   Data Review:    CBC Recent Labs  Lab 07/13/19 1119 07/15/19 0936 07/16/19 0711 07/17/19 0450 07/18/19 0436 07/19/19 0143  WBC 6.1 15.8* 10.2 8.8 9.2 10.0  HGB 12.0* 12.2* 11.7* 11.5* 11.5* 12.1*  HCT 36.2* 37.4* 36.5* 35.4* 35.8* 38.0*  PLT 141* 232 210 214 208 235  MCV 93.5 94.2 95.3 93.2 93.5 93.6  MCH 31.0 30.7 30.5 30.3 30.0 29.8  MCHC 33.1 32.6 32.1 32.5 32.1 31.8  RDW 14.1 14.3 14.3 14.1 14.4 14.4  LYMPHSABS 0.6*  --   --   --   --   --   MONOABS 0.2  --   --   --   --   --   EOSABS 0.0  --   --   --   --   --   BASOSABS 0.0  --   --   --   --   --     Chemistries  Recent Labs  Lab  07/14/19 0849 07/15/19 0936 07/16/19 0711 07/17/19 0450 07/18/19 0436 07/19/19 0143  NA 140 141 141 138 139 140  K 3.3* 3.8 4.1 3.9 4.0 4.2  CL 99 101 100 103 101 99  CO2 29 28 29 27 29 30   GLUCOSE 178* 194* 157* 135* 154* 134*  BUN 29* 30* 27* 23 23 24*  CREATININE 1.06 0.91 0.73 0.85 0.88 0.83  CALCIUM 8.1* 8.5* 8.1* 7.9* 8.2* 8.4*  MG 2.8* 2.6* 2.3 2.2 2.1  --   AST 111* 86* 77* 112* 76* 64*  ALT 128* 146* 141* 192* 196* 205*  ALKPHOS 81 82 78 63 62 60  BILITOT 0.7 0.9 1.0 0.5 0.5 0.8   ------------------------------------------------------------------------------------------------------------------ No results for input(s): CHOL, HDL, LDLCALC, TRIG, CHOLHDL, LDLDIRECT in the last 72 hours.  Lab Results  Component Value Date   HGBA1C 5.6  04/09/2013   ------------------------------------------------------------------------------------------------------------------ No results for input(s): TSH, T4TOTAL, T3FREE, THYROIDAB in the last 72 hours.  Invalid input(s): FREET3 ------------------------------------------------------------------------------------------------------------------ Recent Labs    07/18/19 0436 07/19/19 0143  FERRITIN 789* 832*    Coagulation profile No results for input(s): INR, PROTIME in the last 168 hours.  Recent Labs    07/17/19 1659 07/18/19 0436  DDIMER 19.81* 17.75*    Cardiac Enzymes No results for input(s): CKMB, TROPONINI, MYOGLOBIN in the last 168 hours.  Invalid input(s): CK ------------------------------------------------------------------------------------------------------------------    Component Value Date/Time   BNP 566.7 (H) 07/13/2019 1119    Micro Results Recent Results (from the past 240 hour(s))  Culture, blood (Routine X 2) w Reflex to ID Panel     Status: None   Collection Time: 07/13/19 11:40 AM   Specimen: BLOOD  Result Value Ref Range Status   Specimen Description BLOOD RIGHT ANTECUBITAL  Final   Special  Requests   Final    BOTTLES DRAWN AEROBIC AND ANAEROBIC Blood Culture results may not be optimal due to an inadequate volume of blood received in culture bottles   Culture   Final    NO GROWTH 5 DAYS Performed at [facility], [place] [address], [place], [place] [address]    Report Status 07/18/2019 FINAL  Final  Culture, blood (Routine X 2) w Reflex to ID Panel     Status: None   Collection Time: 07/13/19 11:50 AM   Specimen: BLOOD  Result Value Ref Range Status   Specimen Description BLOOD LEFT ANTECUBITAL  Final   Special Requests   Final    BOTTLES DRAWN AEROBIC AND ANAEROBIC Blood Culture results may not be optimal due to an inadequate volume of blood received in culture bottles   Culture   Final    NO GROWTH 5 DAYS Performed at [facility], [place] [address] [place], [place] [address]    Report Status 07/18/2019 FINAL  Final  Respiratory Panel by RT PCR (Flu A&B, Covid) - Nasopharyngeal Swab     Status: Abnormal   Collection Time: 07/13/19 11:50 AM   Specimen: Nasopharyngeal Swab  Result Value Ref Range Status   SARS Coronavirus 2 by RT PCR POSITIVE (A) NEGATIVE Final    Comment: RESULT CALLED TO, READ BACK BY AND VERIFIED WITH: MCKWON ON [id number] AT [id number] BY NFIELDS (NOTE) SARS-CoV-2 target nucleic acids are DETECTED. SARS-CoV-2 RNA is generally detectable in upper respiratory specimens  during the acute phase of infection. Positive results are indicative of the presence of the identified virus, but do not rule out bacterial infection or co-infection with other pathogens not detected by the test. Clinical correlation with patient history and other diagnostic information is necessary to determine patient infection status. The expected result is Negative. Fact Sheet for Patients:  PinkCheek.be Fact Sheet for Healthcare Providers: GravelBags.it This test is not yet approved or cleared by the [place] FDA and   has been authorized for detection and/or diagnosis of SARS-CoV-2 by FDA under an Emergency Use Authorization (EUA).  This EUA will remain in effect (meaning this test can be used) f or the duration of  the COVID-19 declaration under Section 564(b)(1) of the Act, 21 U.S.C. section 360bbb-3(b)(1), unless the authorization is terminated or revoked sooner.    Influenza A by PCR NEGATIVE NEGATIVE Final   Influenza B by PCR NEGATIVE NEGATIVE Final    Comment: (NOTE) The Xpert Xpress SARS-CoV-2/FLU/RSV assay is intended as an aid in  the diagnosis of influenza  from Nasopharyngeal swab specimens and  should not be used as a sole basis for treatment. Nasal washings and  aspirates are unacceptable for Xpert Xpress SARS-CoV-2/FLU/RSV  testing. Fact Sheet for Patients: PinkCheek.be Fact Sheet for Healthcare Providers: GravelBags.it This test is not yet approved or cleared by the [place] FDA and  has been authorized for detection and/or diagnosis of SARS-CoV-2 by  FDA under an Emergency Use Authorization (EUA). This EUA will remain  in effect (meaning this test can be used) for the duration of the  Covid-19 declaration under Section 564(b)(1) of the Act, 21  U.S.C. section 360bbb-3(b)(1), unless the authorization is  terminated or revoked. Performed at [facility], [place] [address], [place], [place] [address]     Radiology Reports CT Angio Chest PE W and/or Wo Contrast  Result Date: 07/13/2019 CLINICAL DATA:  Shortness of breath. Productive cough. EXAM: CT ANGIOGRAPHY CHEST WITH CONTRAST TECHNIQUE: Multidetector CT imaging of the chest was performed using the standard protocol during bolus administration of intravenous contrast. Multiplanar CT image reconstructions and MIPs were obtained to evaluate the vascular anatomy. CONTRAST:  60mL OMNIPAQUE IOHEXOL 350 MG/ML SOLN COMPARISON:  Radiograph of same day. FINDINGS:  Cardiovascular: Small filling defect is seen in lower lobe branch of right pulmonary artery consistent with small pulmonary embolus. Small cardiomegaly is noted. No pericardial effusion is noted. Atherosclerosis of thoracic aorta is noted without aneurysm formation. Mediastinum/Nodes: No enlarged mediastinal, hilar, or axillary lymph nodes. Thyroid gland, trachea, and esophagus demonstrate no significant findings. Lungs/Pleura: No pneumothorax or pleural effusion is noted. Large bilateral airspace opacities are noted concerning for multifocal pneumonia. Upper Abdomen: No acute abnormality. Musculoskeletal: Sternotomy wires are noted. No acute osseous abnormality is noted. Review of the MIP images confirms the above findings. IMPRESSION: 1. Small filling defect is seen in lower lobe branch of right pulmonary artery consistent with small pulmonary embolus. Critical Value/emergent results were called by telephone at the time of interpretation on 07/13/2019 at 3:21 pm to [place] , who verbally acknowledged these results. 2. Small cardiomegaly. 3. Large bilateral airspace opacities are noted concerning for multifocal pneumonia. 4. Aortic atherosclerosis. Aortic Atherosclerosis (ICD10-I70.0). Electronically Signed   By: Marijo Conception M.D.   On: 07/13/2019 15:22   DG Chest Portable 1 View  Result Date: 07/13/2019 CLINICAL DATA:  Hypoxemia. Likely COVID. Additional history provided: Patient's wife COVID positive. Generalized weakness, productive cough with yellowish green phlegm and fever for 2 weeks. EXAM: PORTABLE CHEST 1 VIEW COMPARISON:  Chest radiograph 06/16/2019 FINDINGS: Mild cardiomegaly, unchanged. Prior median sternotomy. Aortic atherosclerosis. Interval development of bilateral ill-defined airspace opacities with a right upper lobe predominance on the right, and upper to mid lung field predominance on the left. No evidence of pleural effusion or pneumothorax. No acute bony abnormality. Partially  visualized cervical ACDF hardware. IMPRESSION: Interval development of bilateral airspace opacities consistent with multifocal pneumonia. Cardiomegaly, unchanged Electronically Signed   By: Kellie Simmering DO   On: 07/13/2019 11:13

## 2019-07-20 LAB — CBC
HCT: 38.3 % — ABNORMAL LOW (ref 39.0–52.0)
Hemoglobin: 12.1 g/dL — ABNORMAL LOW (ref 13.0–17.0)
MCH: 30 pg (ref 26.0–34.0)
MCHC: 31.6 g/dL (ref 30.0–36.0)
MCV: 95 fL (ref 80.0–100.0)
Platelets: 246 10*3/uL (ref 150–400)
RBC: 4.03 MIL/uL — ABNORMAL LOW (ref 4.22–5.81)
RDW: 14.6 % (ref 11.5–15.5)
WBC: 8.7 10*3/uL (ref 4.0–10.5)
nRBC: 0 % (ref 0.0–0.2)

## 2019-07-20 LAB — COMPREHENSIVE METABOLIC PANEL
ALT: 185 U/L — ABNORMAL HIGH (ref 0–44)
AST: 46 U/L — ABNORMAL HIGH (ref 15–41)
Albumin: 2.6 g/dL — ABNORMAL LOW (ref 3.5–5.0)
Alkaline Phosphatase: 61 U/L (ref 38–126)
Anion gap: 8 (ref 5–15)
BUN: 22 mg/dL (ref 8–23)
CO2: 32 mmol/L (ref 22–32)
Calcium: 8.5 mg/dL — ABNORMAL LOW (ref 8.9–10.3)
Chloride: 99 mmol/L (ref 98–111)
Creatinine, Ser: 0.82 mg/dL (ref 0.61–1.24)
GFR calc Af Amer: >60 mL/min (ref >60)
GFR calc non Af Amer: >60 mL/min (ref >60)
Glucose, Bld: 100 mg/dL — ABNORMAL HIGH (ref 70–99)
Potassium: 4.7 mmol/L (ref 3.5–5.1)
Sodium: 139 mmol/L (ref 135–145)
Total Bilirubin: 0.6 mg/dL (ref 0.3–1.2)
Total Protein: 5.7 g/dL — ABNORMAL LOW (ref 6.5–8.1)

## 2019-07-20 LAB — FERRITIN: Ferritin: 711 ng/mL — ABNORMAL HIGH (ref 24–336)

## 2019-07-20 LAB — C-REACTIVE PROTEIN: CRP: 2.3 mg/dL — ABNORMAL HIGH (ref <1.0)

## 2019-07-20 LAB — HEPARIN LEVEL (UNFRACTIONATED): Heparin Unfractionated: 0.51 IU/mL (ref 0.30–0.70)

## 2019-07-20 MED ORDER — RIVAROXABAN 15 MG PO TABS
15.0000 mg | ORAL_TABLET | Freq: Two times a day (BID) | ORAL | Status: DC
  Administered 2019-07-20 – 2019-07-23 (×7): 15 mg via ORAL
  Filled 2019-07-20 (×6): qty 1

## 2019-07-20 MED ORDER — FUROSEMIDE 10 MG/ML IJ SOLN
20.0000 mg | Freq: Once | INTRAMUSCULAR | Status: AC
  Administered 2019-07-20: 20 mg via INTRAVENOUS
  Filled 2019-07-20: qty 2

## 2019-07-20 MED ORDER — RIVAROXABAN 20 MG PO TABS: 20.0000 mg | ORAL_TABLET | Freq: Every day | ORAL | Status: DC

## 2019-07-20 MED ORDER — HYDROCORTISONE ACETATE 25 MG RE SUPP
25.0000 mg | Freq: Two times a day (BID) | RECTAL | Status: AC
  Administered 2019-07-20 – 2019-07-21 (×4): 25 mg via RECTAL
  Filled 2019-07-20 (×4): qty 1

## 2019-07-20 MED ORDER — NYSTATIN 100000 UNIT/ML MT SUSP
5.0000 mL | Freq: Four times a day (QID) | OROMUCOSAL | Status: DC
  Administered 2019-07-20 – 2019-07-23 (×11): 500000 [IU] via ORAL
  Filled 2019-07-20 (×14): qty 5

## 2019-07-20 MED ORDER — FLUCONAZOLE 100MG IVPB
100.0000 mg | INTRAVENOUS | Status: DC
  Filled 2019-07-20: qty 50

## 2019-07-20 NOTE — Progress Notes (Signed)
PROGRESS NOTE                                                                                                                                                                                                             Patient Demographics:    Matthew Sullivan, is a 74 y.o. male, DOB - 1945/11/07, [id number]  Outpatient Primary MD for the patient is Tonia Ghent, MD   Admit date - 07/13/2019   LOS - 7  Chief Complaint  Patient presents with  . COVID  . Shortness of Breath       Brief Narrative: Patient is a 74 y.o. male with PMHx of ofHTN; HLD; andh/o atrial myxoma on Xareltopresented with  SOB-found to have acute hypoxic resp failure 2/2 COVID PNA and PE.    Subjective:    Matthew Sullivan Stable on 3 L at rest-but continues to have-needs to be ambulated more mains stable on 3-4 L of oxygen at rest-but still continues to get SOB with ambulation   Assessment  & Plan :   Acute Hypoxic Resp Failure due to Covid 19 Viral pneumonia, concurrent bacterial pneumonia and pulmonary embolism: Slowly improving-down to 3 L of oxygen-but desaturate significantly with minimal activity.  Completed a course of remdesivir on 1/7, also completed a course of antibiotics for concurrent bacterial pneumonia.  Continue tapering steroids.  Needs further optimization so that oxygen requirements are relatively stable both at rest and with ambulation before consideration of discharge.  Fever: afebrile  O2 requirements:  SpO2: 96 %(Simultaneous filing. User may not have seen previous data.) O2 Flow Rate (L/min): 4 L/min   COVID-19 Labs: Recent Labs    07/17/19 1659 07/18/19 0436 07/19/19 0143 07/20/19 0544  DDIMER 19.81* 17.75*  --   --   FERRITIN  --  789* 832* 711*  CRP  --  5.0* 3.7* 2.3*       Component Value Date/Time   BNP 566.7 (H) 07/13/2019 1119    Recent Labs  Lab 07/14/19 0849 07/15/19 0936 07/16/19 0711  07/17/19 0450  PROCALCITON 1.08 0.65 0.42 0.13    Lab Results  Component Value Date   SARSCOV2NAA POSITIVE (A) 07/13/2019     COVID-19 Medications: Steroids:1/3>> Remdesivir:1/3>>1/7 Convalescent Plasma:1/4 x 1  Other medications: Diuretics: Try Lasix 20 mg IV x 1 to maintain neg balance Antibiotics: Rocephin: 1/3>>1/8  Zithromax:1/3>>1/6  Prone/Incentive Spirometry: encouraged incentive spirometry use 3-4/hour.  DVT Prophylaxis  :  Lovenox   Acute [id number] branch and right lower lobe.On Xarelto at home, he has not been very compliant-likely PE provoked from COVID 19 and non compliance to xarelto. Suspect that this is not Xarelto failure.Managed with IV heparin-ok to transition to Xarelto today.   HTN: controlled-continue lopressor  Transaminitis:2/2 covid-continues to have elevation in ALT-now off remdesivir-follow for now.  HLD:-hold Lipitor given elevated LFT's  H/o atrial myxoma/PAF:-hx of myxoma removed in 2014 and brief period of post-op afib that resolved after electrical cardioversion later that year. On anticoagulation-see above  Hemorrhoidal bleed: had improved-but again now having intermittent bleeding-per RN a few days back-patient had refused anusol suppository-have re-ordered   Nutrition Problem: Nutrition Problem: Increased nutrient needs Etiology: acute illness(COVID-19) Signs/Symptoms: estimated needs Interventions: Ensure Enlive (each supplement provides 350kcal and 20 grams of protein), Magic cup  Obesity: Estimated body mass index is 30.5 kg/m as calculated from the following:   Height as of this encounter: 6' (1.829 m).   Weight as of this encounter: 102 kg.    Consults  :  None  Procedures  :  None  ABG:    Component Value Date/Time   PHART 7.322 (L) 04/09/2013 1924   PCO2ART 45.2 (H) 04/09/2013 1924   PO2ART 110.0 (H) 04/09/2013 1924   HCO3 23.1 04/09/2013 1924   TCO2 24 04/10/2013 1635   ACIDBASEDEF 3.0 (H) 04/09/2013 1924    O2SAT 97.0 04/09/2013 1924    Vent Settings: N/A  Condition -  Guarded  Family Communication  :  Spouse over the phone 1/10  Code Status :  Full Code  Diet :  Diet Order            Diet regular Room service appropriate? Yes; Fluid consistency: Thin  Diet effective now               Disposition Plan  :  Remain hospitalized  Barriers to discharge: Hypoxia requiring O2 supplementation  Antimicorbials  :    Anti-infectives (From admission, onward)   Start     Dose/Rate Route Frequency Ordered Stop   07/14/19 1330  azithromycin (ZITHROMAX) 500 mg in sodium chloride 0.9 % 250 mL IVPB  Status:  Discontinued     500 mg 250 mL/hr over 60 Minutes Intravenous Every 24 hours 07/13/19 1824 07/17/19 1117   07/14/19 1300  cefTRIAXone (ROCEPHIN) 1 g in sodium chloride 0.9 % 100 mL IVPB     1 g 200 mL/hr over 30 Minutes Intravenous Every 24 hours 07/13/19 1824 07/18/19 1215   07/14/19 1000  remdesivir 100 mg in sodium chloride 0.9 % 100 mL IVPB     100 mg 200 mL/hr over 30 Minutes Intravenous Daily 07/13/19 1615 07/17/19 0845   07/13/19 1630  remdesivir 200 mg in sodium chloride 0.9% 250 mL IVPB     200 mg 580 mL/hr over 30 Minutes Intravenous Once 07/13/19 1615 07/13/19 1806   07/13/19 1115  cefTRIAXone (ROCEPHIN) 1 g in sodium chloride 0.9 % 100 mL IVPB     1 g 200 mL/hr over 30 Minutes Intravenous  Once 07/13/19 1111 07/13/19 1340   07/13/19 1115  azithromycin (ZITHROMAX) 500 mg in sodium chloride 0.9 % 250 mL IVPB     500 mg 250 mL/hr over 60 Minutes Intravenous  Once 07/13/19 1111 07/13/19 1515      Inpatient Medications  Scheduled Meds: . dexamethasone (DECADRON) injection  10 mg Intravenous Q24H  .  feeding supplement (ENSURE ENLIVE)  237 mL Oral TID BM  . fluticasone  2 spray Each Nare Daily  . metoprolol tartrate  12.5 mg Oral BID  . oxymetazoline  1 spray Each Nare BID  . sodium chloride flush  3 mL Intravenous Q12H  . sodium chloride flush  3 mL Intravenous Q12H    Continuous Infusions: . sodium chloride    . heparin 1,350 Units/hr (07/19/19 2358)   PRN Meds:.sodium chloride, acetaminophen, albuterol, bisacodyl, chlorpheniramine-HYDROcodone, guaiFENesin-dextromethorphan, hydrocortisone, ondansetron **OR** ondansetron (ZOFRAN) IV, oxyCODONE, polyethylene glycol, sodium chloride flush, sodium phosphate, witch hazel-glycerin   Time Spent in minutes  25   See all Orders from today for further details   Oren Binet M.D on 07/20/2019 at 11:52 AM  To page go to [email or web address] - use universal password  [facility] -  Office  [phone]    Objective:   Vitals:   07/20/19 0419 07/20/19 0700 07/20/19 0800 07/20/19 1054  BP: 118/64  111/78 119/69  Pulse: 60 73 (!) 56 61  Resp: 18  18   Temp: (!) 97.4 F (36.3 C)  97.6 F (36.4 C)   TempSrc: Oral  Oral   SpO2: 95% 93% 96%   Weight:      Height:        Wt Readings from Last 3 Encounters:  07/13/19 102 kg  06/20/19 98.9 kg  04/22/19 102 kg     Intake/Output Summary (Last 24 hours) at 07/20/2019 1152 Last data filed at 07/20/2019 1025 Gross per 24 hour  Intake 1481.66 ml  Output 2375 ml  Net -893.34 ml     Physical Exam Gen Exam:Alert awake-not in any distress HEENT:atraumatic, normocephalic Chest: B/L clear to auscultation anteriorly CVS:S1S2 regular Abdomen:soft non tender, non distended Extremities:no edema Neurology: Non focal Skin: no rash   Data Review:    CBC Recent Labs  Lab 07/16/19 0711 07/17/19 0450 07/18/19 0436 07/19/19 0143 07/20/19 0544  WBC 10.2 8.8 9.2 10.0 8.7  HGB 11.7* 11.5* 11.5* 12.1* 12.1*  HCT 36.5* 35.4* 35.8* 38.0* 38.3*  PLT 210 214 208 235 246  MCV 95.3 93.2 93.5 93.6 95.0  MCH 30.5 30.3 30.0 29.8 30.0  MCHC 32.1 32.5 32.1 31.8 31.6  RDW 14.3 14.1 14.4 14.4 14.6    Chemistries  Recent Labs  Lab 07/14/19 0849 07/15/19 0936 07/16/19 0711 07/17/19 0450 07/18/19 0436 07/19/19 0143 07/20/19 0544  NA 140 141 141 138  139 140 139  K 3.3* 3.8 4.1 3.9 4.0 4.2 4.7  CL 99 101 100 103 101 99 99  CO2 29 28 29 27 29 30  32  GLUCOSE 178* 194* 157* 135* 154* 134* 100*  BUN 29* 30* 27* 23 23 24* 22  CREATININE 1.06 0.91 0.73 0.85 0.88 0.83 0.82  CALCIUM 8.1* 8.5* 8.1* 7.9* 8.2* 8.4* 8.5*  MG 2.8* 2.6* 2.3 2.2 2.1  --   --   AST 111* 86* 77* 112* 76* 64* 46*  ALT 128* 146* 141* 192* 196* 205* 185*  ALKPHOS 81 82 78 63 62 60 61  BILITOT 0.7 0.9 1.0 0.5 0.5 0.8 0.6   ------------------------------------------------------------------------------------------------------------------ No results for input(s): CHOL, HDL, LDLCALC, TRIG, CHOLHDL, LDLDIRECT in the last 72 hours.  Lab Results  Component Value Date   HGBA1C 5.6 04/09/2013   ------------------------------------------------------------------------------------------------------------------ No results for input(s): TSH, T4TOTAL, T3FREE, THYROIDAB in the last 72 hours.  Invalid input(s): FREET3 ------------------------------------------------------------------------------------------------------------------ Recent Labs    07/19/19 0143 07/20/19 0544  FERRITIN 832* 711*    Coagulation  profile No results for input(s): INR, PROTIME in the last 168 hours.  Recent Labs    07/17/19 1659 07/18/19 0436  DDIMER 19.81* 17.75*    Cardiac Enzymes No results for input(s): CKMB, TROPONINI, MYOGLOBIN in the last 168 hours.  Invalid input(s): CK ------------------------------------------------------------------------------------------------------------------    Component Value Date/Time   BNP 566.7 (H) 07/13/2019 1119    Micro Results Recent Results (from the past 240 hour(s))  Culture, blood (Routine X 2) w Reflex to ID Panel     Status: None   Collection Time: 07/13/19 11:40 AM   Specimen: BLOOD  Result Value Ref Range Status   Specimen Description BLOOD RIGHT ANTECUBITAL  Final   Special Requests   Final    BOTTLES DRAWN AEROBIC AND ANAEROBIC  Blood Culture results may not be optimal due to an inadequate volume of blood received in culture bottles   Culture   Final    NO GROWTH 5 DAYS Performed at [facility], [place] [address], [place], [place] [address]    Report Status 07/18/2019 FINAL  Final  Culture, blood (Routine X 2) w Reflex to ID Panel     Status: None   Collection Time: 07/13/19 11:50 AM   Specimen: BLOOD  Result Value Ref Range Status   Specimen Description BLOOD LEFT ANTECUBITAL  Final   Special Requests   Final    BOTTLES DRAWN AEROBIC AND ANAEROBIC Blood Culture results may not be optimal due to an inadequate volume of blood received in culture bottles   Culture   Final    NO GROWTH 5 DAYS Performed at [facility], [place] [address] [place], [place] [address]    Report Status 07/18/2019 FINAL  Final  Respiratory Panel by RT PCR (Flu A&B, Covid) - Nasopharyngeal Swab     Status: Abnormal   Collection Time: 07/13/19 11:50 AM   Specimen: Nasopharyngeal Swab  Result Value Ref Range Status   SARS Coronavirus 2 by RT PCR POSITIVE (A) NEGATIVE Final    Comment: RESULT CALLED TO, READ BACK BY AND VERIFIED WITH: MCKWON ON [id number] AT [id number] BY NFIELDS (NOTE) SARS-CoV-2 target nucleic acids are DETECTED. SARS-CoV-2 RNA is generally detectable in upper respiratory specimens  during the acute phase of infection. Positive results are indicative of the presence of the identified virus, but do not rule out bacterial infection or co-infection with other pathogens not detected by the test. Clinical correlation with patient history and other diagnostic information is necessary to determine patient infection status. The expected result is Negative. Fact Sheet for Patients:  PinkCheek.be Fact Sheet for Healthcare Providers: GravelBags.it This test is not yet approved or cleared by the [place] FDA and  has been authorized for detection and/or diagnosis of  SARS-CoV-2 by FDA under an Emergency Use Authorization (EUA).  This EUA will remain in effect (meaning this test can be used) f or the duration of  the COVID-19 declaration under Section 564(b)(1) of the Act, 21 U.S.C. section 360bbb-3(b)(1), unless the authorization is terminated or revoked sooner.    Influenza A by PCR NEGATIVE NEGATIVE Final   Influenza B by PCR NEGATIVE NEGATIVE Final    Comment: (NOTE) The Xpert Xpress SARS-CoV-2/FLU/RSV assay is intended as an aid in  the diagnosis of influenza from Nasopharyngeal swab specimens and  should not be used as a sole basis for treatment. Nasal washings and  aspirates are unacceptable for Xpert Xpress SARS-CoV-2/FLU/RSV  testing. Fact Sheet for Patients: PinkCheek.be Fact Sheet for Healthcare Providers:  GravelBags.it This test is not yet approved or cleared by the [place] and  has been authorized for detection and/or diagnosis of SARS-CoV-2 by  FDA under an Emergency Use Authorization (EUA). This EUA will remain  in effect (meaning this test can be used) for the duration of the  Covid-19 declaration under Section 564(b)(1) of the Act, 21  U.S.C. section 360bbb-3(b)(1), unless the authorization is  terminated or revoked. Performed at [facility], [place] [address], [place], [place] [address]     Radiology Reports CT Angio Chest PE W and/or Wo Contrast  Result Date: 07/13/2019 CLINICAL DATA:  Shortness of breath. Productive cough. EXAM: CT ANGIOGRAPHY CHEST WITH CONTRAST TECHNIQUE: Multidetector CT imaging of the chest was performed using the standard protocol during bolus administration of intravenous contrast. Multiplanar CT image reconstructions and MIPs were obtained to evaluate the vascular anatomy. CONTRAST:  41mL OMNIPAQUE IOHEXOL 350 MG/ML SOLN COMPARISON:  Radiograph of same day. FINDINGS: Cardiovascular: Small filling defect is seen in lower lobe branch of  right pulmonary artery consistent with small pulmonary embolus. Small cardiomegaly is noted. No pericardial effusion is noted. Atherosclerosis of thoracic aorta is noted without aneurysm formation. Mediastinum/Nodes: No enlarged mediastinal, hilar, or axillary lymph nodes. Thyroid gland, trachea, and esophagus demonstrate no significant findings. Lungs/Pleura: No pneumothorax or pleural effusion is noted. Large bilateral airspace opacities are noted concerning for multifocal pneumonia. Upper Abdomen: No acute abnormality. Musculoskeletal: Sternotomy wires are noted. No acute osseous abnormality is noted. Review of the MIP images confirms the above findings. IMPRESSION: 1. Small filling defect is seen in lower lobe branch of right pulmonary artery consistent with small pulmonary embolus. Critical Value/emergent results were called by telephone at the time of interpretation on 07/13/2019 at 3:21 pm to [place] , who verbally acknowledged these results. 2. Small cardiomegaly. 3. Large bilateral airspace opacities are noted concerning for multifocal pneumonia. 4. Aortic atherosclerosis. Aortic Atherosclerosis (ICD10-I70.0). Electronically Signed   By: Marijo Conception M.D.   On: 07/13/2019 15:22   DG Chest Portable 1 View  Result Date: 07/13/2019 CLINICAL DATA:  Hypoxemia. Likely COVID. Additional history provided: Patient's wife COVID positive. Generalized weakness, productive cough with yellowish green phlegm and fever for 2 weeks. EXAM: PORTABLE CHEST 1 VIEW COMPARISON:  Chest radiograph 06/16/2019 FINDINGS: Mild cardiomegaly, unchanged. Prior median sternotomy. Aortic atherosclerosis. Interval development of bilateral ill-defined airspace opacities with a right upper lobe predominance on the right, and upper to mid lung field predominance on the left. No evidence of pleural effusion or pneumothorax. No acute bony abnormality. Partially visualized cervical ACDF hardware. IMPRESSION: Interval development  of bilateral airspace opacities consistent with multifocal pneumonia. Cardiomegaly, unchanged Electronically Signed   By: Kellie Simmering DO   On: 07/13/2019 11:13

## 2019-07-20 NOTE — Progress Notes (Addendum)
ANTICOAGULATION CONSULT NOTE --Follow-Up Consult  Pharmacy Consult for Heparin Indication: pulmonary embolus  No Known Allergies  Patient Measurements: Height: 6' (182.9 cm) Weight: 224 lb 13.9 oz (102 kg) IBW/kg (Calculated) : 77.6 Heparin Dosing Weight: 98.5 kg  Vital Signs: Temp: 97.6 F (36.4 C) (01/10 0800) Temp Source: Oral (01/10 0800) BP: 111/78 (01/10 0800) Pulse Rate: 56 (01/10 0800)  Labs: Recent Labs    07/18/19 0436 07/19/19 0143 07/19/19 1132 07/19/19 1429 07/20/19 0544  HGB 11.5* 12.1*  --   --  12.1*  HCT 35.8* 38.0*  --   --  38.3*  PLT 208 235  --   --  246  HEPARINUNFRC 0.42 0.74* <0.10* 0.55 0.51  CREATININE 0.88 0.83  --   --  0.82    Estimated Creatinine Clearance: 96.2 mL/min (by C-G formula based on SCr of 0.82 mg/dL).   Assessment: On heparin gtt for PE. Confirmed on CT. Was on Xarelto PTA (not compliant, suspect not Xarelto failure).  Heparin level therapeutic (0.51) on gtt at 1350 units/hr. CBC stable. No bleeding noted.  Goal of Therapy:  Heparin level 0.3-0.7 units/ml Monitor platelets by anticoagulation protocol: Yes   Plan:  Cont heparin drip at 1350 units/hr Daily heparin level and CBC Monitor for s/sx of bleeding F/U change to oral AC  Addendum: Transitioning to Xarelto. He has been on IV heparin for ~7d so will shorten twice daily dosing for Xarelto.  Xarelto 15 mg PO bid with meals for 15 days then 20 mg PO daily with supper. Education prior to discharge Pharmacy will sign off but continue to follow peripherally  Thank you for involving pharmacy in this patient's care.  Renold Genta, PharmD, BCPS Clinical Pharmacist Clinical phone for 07/20/2019 until 3:30p is [phone] 07/20/2019 9:46 AM  **Pharmacist phone directory can be found on [email or web address] listed under [place]**

## 2019-07-21 LAB — CBC
HCT: 38.8 % — ABNORMAL LOW (ref 39.0–52.0)
Hemoglobin: 12.4 g/dL — ABNORMAL LOW (ref 13.0–17.0)
MCH: 29.8 pg (ref 26.0–34.0)
MCHC: 32 g/dL (ref 30.0–36.0)
MCV: 93.3 fL (ref 80.0–100.0)
Platelets: 261 10*3/uL (ref 150–400)
RBC: 4.16 MIL/uL — ABNORMAL LOW (ref 4.22–5.81)
RDW: 14.6 % (ref 11.5–15.5)
WBC: 9.3 10*3/uL (ref 4.0–10.5)
nRBC: 0 % (ref 0.0–0.2)

## 2019-07-21 LAB — C-REACTIVE PROTEIN: CRP: 1.7 mg/dL — ABNORMAL HIGH (ref <1.0)

## 2019-07-21 MED ORDER — DEXAMETHASONE SODIUM PHOSPHATE 10 MG/ML IJ SOLN
6.0000 mg | INTRAMUSCULAR | Status: AC
  Administered 2019-07-22 – 2019-07-23 (×2): 6 mg via INTRAVENOUS
  Filled 2019-07-21 (×2): qty 1

## 2019-07-21 NOTE — Plan of Care (Signed)
  Problem: Health Behavior/Discharge Planning: Goal: Ability to manage health-related needs will improve Outcome: Progressing   Problem: Clinical Measurements: Goal: Ability to maintain clinical measurements within normal limits will improve Outcome: Progressing Goal: Will remain free from infection Outcome: Progressing Goal: Diagnostic test results will improve Outcome: Progressing Goal: Respiratory complications will improve Outcome: Progressing Goal: Cardiovascular complication will be avoided Outcome: Progressing   Problem: Activity: Goal: Risk for activity intolerance will decrease Outcome: Progressing   Problem: Coping: Goal: Level of anxiety will decrease Outcome: Progressing   Problem: Elimination: Goal: Will not experience complications related to bowel motility Outcome: Progressing Goal: Will not experience complications related to urinary retention Outcome: Progressing   Problem: Pain Managment: Goal: General experience of comfort will improve Outcome: Progressing   Problem: Safety: Goal: Ability to remain free from injury will improve Outcome: Progressing   Problem: Education: Goal: Knowledge of risk factors and measures for prevention of condition will improve Outcome: Progressing   Problem: Coping: Goal: Psychosocial and spiritual needs will be supported Outcome: Progressing   Problem: Respiratory: Goal: Will maintain a patent airway Outcome: Progressing Goal: Complications related to the disease process, condition or treatment will be avoided or minimized Outcome: Progressing

## 2019-07-21 NOTE — TOC Progression Note (Signed)
Transition of Care [facility]) - Progression Note    Patient Details  Name: Matthew Sullivan MRN: [id number] Date of Birth: Jun 08, 1946  [facility] CM/SW Contact  Joaquin Courts, RN Phone Number: 07/21/2019, 3:06 PM  Clinical Narrative:    CM unable to reach patient on room or cell phone to discuss dc planning. CM spoke with spouse who is agreeable to [facility] services.  [facility] arranged to provide HHPT. Spouse declines 3-in-1 that was recommended in OT eval. MD please place [place] orders.   Expected Discharge Plan: [place] Barriers to Discharge: Continued Medical Work up  Expected Discharge Plan and Services Expected Discharge Plan: [place]   Discharge Planning Services: CM Consult Post Acute Care Choice: [place] arrangements for the past 2 months: Single Family Home                 DME Arranged: N/A DME Agency: NA       HH Arranged: PT HH Agency: [facility] Date HH Agency Contacted: 07/21/19 Time [place]: [phone] Representative spoke with at [place]: Amy   Social Determinants of Health ([place]) Interventions    Readmission Risk Interventions No flowsheet data found.

## 2019-07-21 NOTE — Progress Notes (Signed)
Nutrition Follow-up RD working remotely.  DOCUMENTATION CODES:   Obesity unspecified  INTERVENTION:   Continue Ensure Enlive po TID, each supplement provides 350 kcal and 20 grams of protein.  Continue Magic cup BID with meals, each supplement provides 290 kcal and 9 grams of protein.  Encourage intake of meals and supplements.  NUTRITION DIAGNOSIS:   Increased nutrient needs related to acute illness(COVID-19) as evidenced by estimated needs.  Ongoing   GOAL:   Patient will meet greater than or equal to 90% of their needs  Progressing   MONITOR:   PO intake, Supplement acceptance, Weight trends, I & O's, Labs(respiratory status)  REASON FOR ASSESSMENT:   Malnutrition Screening Tool    ASSESSMENT:   Patient is an obese 74 yo male with a hx of HTN, HLD, Melanoma. Presents from home short of breath, poor appetite the past 2 weeks. Placed on nonrebreather oxygen saturation in the 50's. COVID positive.  Patient is consuming 50% of meals. He is drinking Ensure Enlive 2-3 times per day on average. Receiving Magic cup with meals BID.  Plans for d/c home with home health services soon.    Labs and medications reviewed.  No new weight since 07/13/19, 102 kg.   Diet Order:   Diet Order            Diet regular Room service appropriate? Yes; Fluid consistency: Thin  Diet effective now              EDUCATION NEEDS:   No education needs have been identified at this time  Skin:  Skin Assessment: Reviewed RN Assessment  Last BM:  1/9  Height:   Ht Readings from Last 1 Encounters:  07/13/19 6' (1.829 m)    Weight:   Wt Readings from Last 1 Encounters:  07/13/19 102 kg    Ideal Body Weight:  81 kg  BMI:  Body mass index is 30.5 kg/m.  Estimated Nutritional Needs:   Kcal:  [id number]  Protein:  113-126 gr  Fluid:  >1900 ml daily    Molli Barrows, RD, LDN, [place] Pager [phone] After Hours Pager [phone]

## 2019-07-21 NOTE — Progress Notes (Signed)
PROGRESS NOTE                                                                                                                                                                                                             Patient Demographics:    Matthew Sullivan, is a 74 y.o. male, DOB - 1945/11/01, [id number]  Outpatient Primary MD for the patient is Tonia Ghent, MD   Admit date - 07/13/2019   LOS - 8  Chief Complaint  Patient presents with  . COVID  . Shortness of Breath       Brief Narrative: Patient is a 74 y.o. male with PMHx of ofHTN; HLD; andh/o atrial myxoma on Xareltopresented with  SOB-found to have acute hypoxic resp failure 2/2 COVID PNA and PE.    Subjective:    Matthew Sullivan is down to 2.5 L of oxygen at rest-however with activity in the room-he is still short of breath.  Have asked nursing staff to ambulate him today to see how he does before we can make discharge plans.  Minimal hemorrhoidal bleeding continues intermittently.   Assessment  & Plan :   Acute Hypoxic Resp Failure due to Covid 19 Viral pneumonia, concurrent bacterial pneumonia and pulmonary embolism: Slow improvement continues-he is down to 2.5 L of oxygen at rest-but continues to feel short of breath with minimal activity.  Need to see if he still desaturates significantly with ambulation.  Continue steroids but taper-completed remdesivir on 1/7.  Await PT eval-see how he does with ambulation before making definite discharge plans.  Fever: afebrile  O2 requirements:  SpO2: 97 % O2 Flow Rate (L/min): 2.5 L/min   COVID-19 Labs: Recent Labs    07/19/19 0143 07/20/19 0544 07/21/19 0505  FERRITIN 832* 711*  --   CRP 3.7* 2.3* 1.7*       Component Value Date/Time   BNP 566.7 (H) 07/13/2019 1119    Recent Labs  Lab 07/15/19 0936 07/16/19 0711 07/17/19 0450  PROCALCITON 0.65 0.42 0.13    Lab Results  Component Value  Date   SARSCOV2NAA POSITIVE (A) 07/13/2019     COVID-19 Medications: Steroids:1/3>> Remdesivir:1/3>>1/7 Convalescent Plasma:1/4 x 1  Other medications: Diuretics: Euvolemic-no need for Lasix today. Antibiotics: Rocephin: 1/3>>1/8 Zithromax:1/3>>1/6  Prone/Incentive Spirometry: encouraged incentive spirometry use 3-4/hour.  DVT Prophylaxis  :  Lovenox  Acute [id number] branch and right lower lobe.On Xarelto at home, he has not been very compliant-likely PE provoked from COVID 19 and non compliance to xarelto. Suspect that this is not Xarelto failure.Managed with IV heparin-has been transitioned to Xarelto on 1/10.  HTN: controlled-continue lopressor  Transaminitis:2/2 covid-continues to have elevation in ALT-now off remdesivir-follow for now.  HLD:-hold Lipitor given elevated LFT's  H/o atrial myxoma/PAF:-hx of myxoma removed in 2014 and brief period of post-op afib that resolved after electrical cardioversion later that year. On anticoagulation-see above  Hemorrhoidal bleed: Continues intermittently-continue Anusol suppository.  Hemoglobin stable.   Nutrition Problem: Nutrition Problem: Increased nutrient needs Etiology: acute illness(COVID-19) Signs/Symptoms: estimated needs Interventions: Ensure Enlive (each supplement provides 350kcal and 20 grams of protein), Magic cup  Obesity: Estimated body mass index is 30.5 kg/m as calculated from the following:   Height as of this encounter: 6' (1.829 m).   Weight as of this encounter: 102 kg.    Consults  :  None  Procedures  :  None  ABG:    Component Value Date/Time   PHART 7.322 (L) 04/09/2013 1924   PCO2ART 45.2 (H) 04/09/2013 1924   PO2ART 110.0 (H) 04/09/2013 1924   HCO3 23.1 04/09/2013 1924   TCO2 24 04/10/2013 1635   ACIDBASEDEF 3.0 (H) 04/09/2013 1924   O2SAT 97.0 04/09/2013 1924    Vent Settings: N/A  Condition -  Guarded  Family Communication  :  Spouse over the phone 1/11  Code Status :   Full Code  Diet :  Diet Order            Diet regular Room service appropriate? Yes; Fluid consistency: Thin  Diet effective now               Disposition Plan  :  Remain hospitalized  Barriers to discharge: Hypoxia requiring O2 supplementation  Antimicorbials  :    Anti-infectives (From admission, onward)   Start     Dose/Rate Route Frequency Ordered Stop   07/20/19 1600  fluconazole (DIFLUCAN) IVPB 100 mg  Status:  Discontinued     100 mg 50 mL/hr over 60 Minutes Intravenous Every 24 hours 07/20/19 1440 07/20/19 1746   07/14/19 1330  azithromycin (ZITHROMAX) 500 mg in sodium chloride 0.9 % 250 mL IVPB  Status:  Discontinued     500 mg 250 mL/hr over 60 Minutes Intravenous Every 24 hours 07/13/19 1824 07/17/19 1117   07/14/19 1300  cefTRIAXone (ROCEPHIN) 1 g in sodium chloride 0.9 % 100 mL IVPB     1 g 200 mL/hr over 30 Minutes Intravenous Every 24 hours 07/13/19 1824 07/18/19 1215   07/14/19 1000  remdesivir 100 mg in sodium chloride 0.9 % 100 mL IVPB     100 mg 200 mL/hr over 30 Minutes Intravenous Daily 07/13/19 1615 07/17/19 0845   07/13/19 1630  remdesivir 200 mg in sodium chloride 0.9% 250 mL IVPB     200 mg 580 mL/hr over 30 Minutes Intravenous Once 07/13/19 1615 07/13/19 1806   07/13/19 1115  cefTRIAXone (ROCEPHIN) 1 g in sodium chloride 0.9 % 100 mL IVPB     1 g 200 mL/hr over 30 Minutes Intravenous  Once 07/13/19 1111 07/13/19 1340   07/13/19 1115  azithromycin (ZITHROMAX) 500 mg in sodium chloride 0.9 % 250 mL IVPB     500 mg 250 mL/hr over 60 Minutes Intravenous  Once 07/13/19 1111 07/13/19 1515      Inpatient Medications  Scheduled Meds: . dexamethasone (DECADRON) injection  10 mg Intravenous Q24H  . feeding supplement (ENSURE ENLIVE)  237 mL Oral TID BM  . fluticasone  2 spray Each Nare Daily  . hydrocortisone  25 mg Rectal BID  . metoprolol tartrate  12.5 mg Oral BID  . nystatin  5 mL Oral QID  . oxymetazoline  1 spray Each Nare BID  . rivaroxaban   15 mg Oral BID WC   Followed by  . [START ON 08/04/2019] rivaroxaban  20 mg Oral Q supper  . sodium chloride flush  3 mL Intravenous Q12H  . sodium chloride flush  3 mL Intravenous Q12H   Continuous Infusions: . sodium chloride     PRN Meds:.sodium chloride, acetaminophen, albuterol, bisacodyl, chlorpheniramine-HYDROcodone, guaiFENesin-dextromethorphan, ondansetron **OR** ondansetron (ZOFRAN) IV, oxyCODONE, polyethylene glycol, sodium chloride flush, sodium phosphate, witch hazel-glycerin   Time Spent in minutes  25   See all Orders from today for further details   Oren Binet M.D on 07/21/2019 at 11:03 AM  To page go to [email or web address] - use universal password  [facility] -  Office  [phone]    Objective:   Vitals:   07/20/19 2119 07/21/19 0010 07/21/19 0437 07/21/19 0741  BP: (!) 113/58 (!) 111/53 118/70 140/80  Pulse: 73 (!) 55 (!) 54 (!) 54  Resp:  20 15 19   Temp:  97.9 F (36.6 C) 97.7 F (36.5 C) (!) 97.5 F (36.4 C)  TempSrc:  Oral Oral Oral  SpO2:  93% 95% 97%  Weight:      Height:        Wt Readings from Last 3 Encounters:  07/13/19 102 kg  06/20/19 98.9 kg  04/22/19 102 kg     Intake/Output Summary (Last 24 hours) at 07/21/2019 1103 Last data filed at 07/21/2019 0000 Gross per 24 hour  Intake 590 ml  Output 1375 ml  Net -785 ml     Physical Exam Gen Exam:Alert awake-not in any distress HEENT:atraumatic, normocephalic Chest: B/L clear to auscultation anteriorly CVS:S1S2 regular Abdomen:soft non tender, non distended Extremities:no edema Neurology: Non focal Skin: no rash   Data Review:    CBC Recent Labs  Lab 07/17/19 0450 07/18/19 0436 07/19/19 0143 07/20/19 0544 07/21/19 0505  WBC 8.8 9.2 10.0 8.7 9.3  HGB 11.5* 11.5* 12.1* 12.1* 12.4*  HCT 35.4* 35.8* 38.0* 38.3* 38.8*  PLT 214 208 235 246 261  MCV 93.2 93.5 93.6 95.0 93.3  MCH 30.3 30.0 29.8 30.0 29.8  MCHC 32.5 32.1 31.8 31.6 32.0  RDW 14.1 14.4 14.4 14.6  14.6    Chemistries  Recent Labs  Lab 07/15/19 0936 07/16/19 0711 07/17/19 0450 07/18/19 0436 07/19/19 0143 07/20/19 0544  NA 141 141 138 139 140 139  K 3.8 4.1 3.9 4.0 4.2 4.7  CL 101 100 103 101 99 99  CO2 28 29 27 29 30  32  GLUCOSE 194* 157* 135* 154* 134* 100*  BUN 30* 27* 23 23 24* 22  CREATININE 0.91 0.73 0.85 0.88 0.83 0.82  CALCIUM 8.5* 8.1* 7.9* 8.2* 8.4* 8.5*  MG 2.6* 2.3 2.2 2.1  --   --   AST 86* 77* 112* 76* 64* 46*  ALT 146* 141* 192* 196* 205* 185*  ALKPHOS 82 78 63 62 60 61  BILITOT 0.9 1.0 0.5 0.5 0.8 0.6   ------------------------------------------------------------------------------------------------------------------ No results for input(s): CHOL, HDL, LDLCALC, TRIG, CHOLHDL, LDLDIRECT in the last 72 hours.  Lab Results  Component Value Date   HGBA1C 5.6 04/09/2013   ------------------------------------------------------------------------------------------------------------------ No results for input(s):  TSH, T4TOTAL, T3FREE, THYROIDAB in the last 72 hours.  Invalid input(s): FREET3 ------------------------------------------------------------------------------------------------------------------ Recent Labs    07/19/19 0143 07/20/19 0544  FERRITIN 832* 711*    Coagulation profile No results for input(s): INR, PROTIME in the last 168 hours.  No results for input(s): DDIMER in the last 72 hours.  Cardiac Enzymes No results for input(s): CKMB, TROPONINI, MYOGLOBIN in the last 168 hours.  Invalid input(s): CK ------------------------------------------------------------------------------------------------------------------    Component Value Date/Time   BNP 566.7 (H) 07/13/2019 1119    Micro Results Recent Results (from the past 240 hour(s))  Culture, blood (Routine X 2) w Reflex to ID Panel     Status: None   Collection Time: 07/13/19 11:40 AM   Specimen: BLOOD  Result Value Ref Range Status   Specimen Description BLOOD RIGHT  ANTECUBITAL  Final   Special Requests   Final    BOTTLES DRAWN AEROBIC AND ANAEROBIC Blood Culture results may not be optimal due to an inadequate volume of blood received in culture bottles   Culture   Final    NO GROWTH 5 DAYS Performed at [facility], [place] [address] [place], [place] [address]    Report Status 07/18/2019 FINAL  Final  Culture, blood (Routine X 2) w Reflex to ID Panel     Status: None   Collection Time: 07/13/19 11:50 AM   Specimen: BLOOD  Result Value Ref Range Status   Specimen Description BLOOD LEFT ANTECUBITAL  Final   Special Requests   Final    BOTTLES DRAWN AEROBIC AND ANAEROBIC Blood Culture results may not be optimal due to an inadequate volume of blood received in culture bottles   Culture   Final    NO GROWTH 5 DAYS Performed at [facility], [place] [address], [place],  [address]    Report Status 07/18/2019 FINAL  Final  Respiratory Panel by RT PCR (Flu A&B, Covid) - Nasopharyngeal Swab     Status: Abnormal   Collection Time: 07/13/19 11:50 AM   Specimen: Nasopharyngeal Swab  Result Value Ref Range Status   SARS Coronavirus 2 by RT PCR POSITIVE (A) NEGATIVE Final    Comment: RESULT CALLED TO, READ BACK BY AND VERIFIED WITH: MCKWON ON [id number] AT [id number] BY NFIELDS (NOTE) SARS-CoV-2 target nucleic acids are DETECTED. SARS-CoV-2 RNA is generally detectable in upper respiratory specimens  during the acute phase of infection. Positive results are indicative of the presence of the identified virus, but do not rule out bacterial infection or co-infection with other pathogens not detected by the test. Clinical correlation with patient history and other diagnostic information is necessary to determine patient infection status. The expected result is Negative. Fact Sheet for Patients:  PinkCheek.be Fact Sheet for Healthcare Providers: GravelBags.it This test is not yet approved or  cleared by the [place] FDA and  has been authorized for detection and/or diagnosis of SARS-CoV-2 by FDA under an Emergency Use Authorization (EUA).  This EUA will remain in effect (meaning this test can be used) f or the duration of  the COVID-19 declaration under Section 564(b)(1) of the Act, 21 U.S.C. section 360bbb-3(b)(1), unless the authorization is terminated or revoked sooner.    Influenza A by PCR NEGATIVE NEGATIVE Final   Influenza B by PCR NEGATIVE NEGATIVE Final    Comment: (NOTE) The Xpert Xpress SARS-CoV-2/FLU/RSV assay is intended as an aid in  the diagnosis of influenza from Nasopharyngeal swab specimens and  should not be used as a sole  basis for treatment. Nasal washings and  aspirates are unacceptable for Xpert Xpress SARS-CoV-2/FLU/RSV  testing. Fact Sheet for Patients: PinkCheek.be Fact Sheet for Healthcare Providers: GravelBags.it This test is not yet approved or cleared by the [place] FDA and  has been authorized for detection and/or diagnosis of SARS-CoV-2 by  FDA under an Emergency Use Authorization (EUA). This EUA will remain  in effect (meaning this test can be used) for the duration of the  Covid-19 declaration under Section 564(b)(1) of the Act, 21  U.S.C. section 360bbb-3(b)(1), unless the authorization is  terminated or revoked. Performed at [facility], [place] [address] [place], [place] [address]     Radiology Reports CT Angio Chest PE W and/or Wo Contrast  Result Date: 07/13/2019 CLINICAL DATA:  Shortness of breath. Productive cough. EXAM: CT ANGIOGRAPHY CHEST WITH CONTRAST TECHNIQUE: Multidetector CT imaging of the chest was performed using the standard protocol during bolus administration of intravenous contrast. Multiplanar CT image reconstructions and MIPs were obtained to evaluate the vascular anatomy. CONTRAST:  77mL OMNIPAQUE IOHEXOL 350 MG/ML SOLN COMPARISON:   Radiograph of same day. FINDINGS: Cardiovascular: Small filling defect is seen in lower lobe branch of right pulmonary artery consistent with small pulmonary embolus. Small cardiomegaly is noted. No pericardial effusion is noted. Atherosclerosis of thoracic aorta is noted without aneurysm formation. Mediastinum/Nodes: No enlarged mediastinal, hilar, or axillary lymph nodes. Thyroid gland, trachea, and esophagus demonstrate no significant findings. Lungs/Pleura: No pneumothorax or pleural effusion is noted. Large bilateral airspace opacities are noted concerning for multifocal pneumonia. Upper Abdomen: No acute abnormality. Musculoskeletal: Sternotomy wires are noted. No acute osseous abnormality is noted. Review of the MIP images confirms the above findings. IMPRESSION: 1. Small filling defect is seen in lower lobe branch of right pulmonary artery consistent with small pulmonary embolus. Critical Value/emergent results were called by telephone at the time of interpretation on 07/13/2019 at 3:21 pm to [place] , who verbally acknowledged these results. 2. Small cardiomegaly. 3. Large bilateral airspace opacities are noted concerning for multifocal pneumonia. 4. Aortic atherosclerosis. Aortic Atherosclerosis (ICD10-I70.0). Electronically Signed   By: Marijo Conception M.D.   On: 07/13/2019 15:22   DG Chest Portable 1 View  Result Date: 07/13/2019 CLINICAL DATA:  Hypoxemia. Likely COVID. Additional history provided: Patient's wife COVID positive. Generalized weakness, productive cough with yellowish green phlegm and fever for 2 weeks. EXAM: PORTABLE CHEST 1 VIEW COMPARISON:  Chest radiograph 06/16/2019 FINDINGS: Mild cardiomegaly, unchanged. Prior median sternotomy. Aortic atherosclerosis. Interval development of bilateral ill-defined airspace opacities with a right upper lobe predominance on the right, and upper to mid lung field predominance on the left. No evidence of pleural effusion or pneumothorax. No  acute bony abnormality. Partially visualized cervical ACDF hardware. IMPRESSION: Interval development of bilateral airspace opacities consistent with multifocal pneumonia. Cardiomegaly, unchanged Electronically Signed   By: Kellie Simmering DO   On: 07/13/2019 11:13

## 2019-07-21 NOTE — Progress Notes (Signed)
SATURATION QUALIFICATIONS: (This note is used to comply with regulatory documentation for home oxygen)  Patient Saturations on Room Air at Rest = 85%  Patient Saturations on Room Air while Ambulating = NA%. Sats <88% at rest, so oxygen was required for ambulation  Patient Saturations on 6 Liters of oxygen while Ambulating = 89-96%  Please briefly explain why patient needs home oxygen: Pt desaturated to 84% on 4 L while ambulating. Oxygen maintained at >88% while on 6 L. Pt will require home oxygen at rest and during exertion for saturations to be maintained at 88% and above.  Hoy Register, M.S., ACSM CEP

## 2019-07-22 ENCOUNTER — Telehealth: Payer: Self-pay | Admitting: Family Medicine

## 2019-07-22 LAB — COMPREHENSIVE METABOLIC PANEL
ALT: 174 U/L — ABNORMAL HIGH (ref 0–44)
AST: 32 U/L (ref 15–41)
Albumin: 2.8 g/dL — ABNORMAL LOW (ref 3.5–5.0)
Alkaline Phosphatase: 59 U/L (ref 38–126)
Anion gap: 13 (ref 5–15)
BUN: 26 mg/dL — ABNORMAL HIGH (ref 8–23)
CO2: 28 mmol/L (ref 22–32)
Calcium: 8.8 mg/dL — ABNORMAL LOW (ref 8.9–10.3)
Chloride: 96 mmol/L — ABNORMAL LOW (ref 98–111)
Creatinine, Ser: 0.81 mg/dL (ref 0.61–1.24)
GFR calc Af Amer: >60 mL/min (ref >60)
GFR calc non Af Amer: >60 mL/min (ref >60)
Glucose, Bld: 151 mg/dL — ABNORMAL HIGH (ref 70–99)
Potassium: 4.4 mmol/L (ref 3.5–5.1)
Sodium: 137 mmol/L (ref 135–145)
Total Bilirubin: 0.5 mg/dL (ref 0.3–1.2)
Total Protein: 6 g/dL — ABNORMAL LOW (ref 6.5–8.1)

## 2019-07-22 LAB — C-REACTIVE PROTEIN: CRP: 0.9 mg/dL (ref <1.0)

## 2019-07-22 LAB — CBC
HCT: 40.4 % (ref 39.0–52.0)
Hemoglobin: 12.9 g/dL — ABNORMAL LOW (ref 13.0–17.0)
MCH: 30.3 pg (ref 26.0–34.0)
MCHC: 31.9 g/dL (ref 30.0–36.0)
MCV: 94.8 fL (ref 80.0–100.0)
Platelets: 239 10*3/uL (ref 150–400)
RBC: 4.26 MIL/uL (ref 4.22–5.81)
RDW: 14.6 % (ref 11.5–15.5)
WBC: 10.3 10*3/uL (ref 4.0–10.5)
nRBC: 0 % (ref 0.0–0.2)

## 2019-07-22 NOTE — Progress Notes (Signed)
PT Cancellation Note  Patient Details Name: RUPERT BAUMHARDT MRN: [id number] DOB: 23-Jul-1945   Cancelled Treatment:    Reason Eval/Treat Not Completed: Other (comment)(Patient not available x 2, the last time being 1:40PM. Will reschedule)  Rollen Sox, PT # [phone] CGV cell   Casandra Doffing 07/22/2019, 4:10 PM

## 2019-07-22 NOTE — Telephone Encounter (Signed)
Patient's wife,Rebecca, called to schedule a follow up from the hospital for patient. Patient had covid and pneumonia.  Patient will be getting out of [facility].  I scheduled a follow up appointment on 07/28/19 @ 4:00.  I set it up as a virtual appointment, but patient's wife wanted me to check to see if patient can be seen in the office. Wells Guiles said a  detailed message may be left on cell phone voice mail.

## 2019-07-22 NOTE — TOC Progression Note (Signed)
Transition of Care [facility]) - Progression Note    Patient Details  Name: Matthew Sullivan MRN: [id number] Date of Birth: 22-May-1946  [facility] CM/SW Contact  Joaquin Courts, RN Phone Number: 07/22/2019, 2:42 PM  Clinical Narrative:    Patient set up with [employer] for home O2. Agency to delivery concentrator to home day of discharge. AC to provide portable O2 tank to patient for dc home. CM spoke with spouse who reports she can pick up patient at dc.     Expected Discharge Plan: [place] Barriers to Discharge: Continued Medical Work up  Expected Discharge Plan and Services Expected Discharge Plan: [place]   Discharge Planning Services: CM Consult Post Acute Care Choice: [place] arrangements for the past 2 months: Single Family Home                 DME Arranged: Oxygen DME Agency: [employer] Date DME Agency Contacted: 07/22/19 Time DME Agency Contacted: [phone] Representative spoke with at DME Agency: Caryl Pina HH Arranged: PT [place]: [facility] [place] Date [place]: 07/21/19 Time [place]: 78 Representative spoke with at [place]: Amy   Social Determinants of Health ([place]) Interventions    Readmission Risk Interventions No flowsheet data found.

## 2019-07-22 NOTE — Plan of Care (Signed)
  Problem: Health Behavior/Discharge Planning: Goal: Ability to manage health-related needs will improve Outcome: Progressing   Problem: Clinical Measurements: Goal: Ability to maintain clinical measurements within normal limits will improve Outcome: Progressing Goal: Will remain free from infection Outcome: Progressing Goal: Diagnostic test results will improve Outcome: Progressing Goal: Respiratory complications will improve Outcome: Progressing Goal: Cardiovascular complication will be avoided Outcome: Progressing   Problem: Activity: Goal: Risk for activity intolerance will decrease Outcome: Progressing   Problem: Coping: Goal: Level of anxiety will decrease Outcome: Progressing   Problem: Elimination: Goal: Will not experience complications related to bowel motility Outcome: Progressing Goal: Will not experience complications related to urinary retention Outcome: Progressing   Problem: Pain Managment: Goal: General experience of comfort will improve Outcome: Progressing   Problem: Safety: Goal: Ability to remain free from injury will improve Outcome: Progressing   Problem: Education: Goal: Knowledge of risk factors and measures for prevention of condition will improve Outcome: Progressing   Problem: Coping: Goal: Psychosocial and spiritual needs will be supported Outcome: Progressing   Problem: Respiratory: Goal: Will maintain a patent airway Outcome: Progressing Goal: Complications related to the disease process, condition or treatment will be avoided or minimized Outcome: Progressing

## 2019-07-22 NOTE — Progress Notes (Signed)
PROGRESS NOTE                                                                                                                                                                                                             Patient Demographics:    Matthew Sullivan, is a 74 y.o. male, DOB - 09-10-1945, [id number]  Outpatient Primary MD for the patient is Tonia Ghent, MD   Admit date - 07/13/2019   LOS - 9  Chief Complaint  Patient presents with  . COVID  . Shortness of Breath       Brief Narrative: Patient is a 74 y.o. male with PMHx of ofHTN; HLD; andh/o atrial myxoma on Xareltopresented with  SOB-found to have acute hypoxic resp failure 2/2 COVID PNA and PE.    Subjective:    Matthew Sullivan is stable at rest on 2-3 L of oxygen (2.5 L this morning) but still gets significant dyspnea with ambulation. He claims he ambulated 4 rounds in his own room yesterday with therapy and required up to 6 L of oxygen to maintain O2 sats.   Assessment  & Plan :   Acute Hypoxic Resp Failure due to Covid 19 Viral pneumonia, concurrent bacterial pneumonia and pulmonary embolism: Very slow improvement continues-he is stable at 2.5 L of oxygen at rest-but requires up to 6 L of oxygen with ambulation. Have asked nursing staff to continue to ambulate him to see how he does-but suspect he will continue to have exertional dyspnea for next few weeks steroids-but has completed remdesivir on 1/7. Continue supportive care-if he remains as is-suspect he could go home with home O2 in the next day or so.  Fever: afebrile  O2 requirements:  SpO2: 96 % O2 Flow Rate (L/min): 2.5 L/min   COVID-19 Labs: Recent Labs    07/20/19 0544 07/21/19 0505 07/22/19 0445  FERRITIN 711*  --   --   CRP 2.3* 1.7* 0.9       Component Value Date/Time   BNP 566.7 (H) 07/13/2019 1119    Recent Labs  Lab 07/16/19 0711 07/17/19 0450  PROCALCITON 0.42 0.13     Lab Results  Component Value Date   SARSCOV2NAA POSITIVE (A) 07/13/2019     COVID-19 Medications: Steroids:1/3>> Remdesivir:1/3>>1/7 Convalescent Plasma:1/4 x 1  Other medications: Diuretics: Euvolemic-no need for Lasix today. Antibiotics: Rocephin:  1/3>>1/8 Zithromax:1/3>>1/6  Prone/Incentive Spirometry: encouraged incentive spirometry use 3-4/hour.  DVT Prophylaxis  :  Lovenox   Acute [id number] branch and right lower lobe.On Xarelto at home, he has not been very compliant-likely PE provoked from COVID 19 and non compliance to xarelto. Suspect that this is not Xarelto failure.Managed with IV heparin-has been transitioned to Xarelto on 1/10.  HTN: controlled-continue lopressor  Transaminitis:2/2 covid-continues to have elevation in ALT-now off remdesivir-follow for now.  HLD:-hold Lipitor given elevated LFT's  H/o atrial myxoma/PAF:-hx of myxoma removed in 2014 and brief period of post-op afib that resolved after electrical cardioversion later that year. On anticoagulation-see above  Hemorrhoidal bleed: Continues intermittently-continue Anusol suppository.  Hemoglobin stable.   Nutrition Problem: Nutrition Problem: Increased nutrient needs Etiology: acute illness(COVID-19) Signs/Symptoms: estimated needs Interventions: Ensure Enlive (each supplement provides 350kcal and 20 grams of protein), Magic cup  Obesity: Estimated body mass index is 30.5 kg/m as calculated from the following:   Height as of this encounter: 6' (1.829 m).   Weight as of this encounter: 102 kg.    Consults  :  None  Procedures  :  None  ABG:    Component Value Date/Time   PHART 7.322 (L) 04/09/2013 1924   PCO2ART 45.2 (H) 04/09/2013 1924   PO2ART 110.0 (H) 04/09/2013 1924   HCO3 23.1 04/09/2013 1924   TCO2 24 04/10/2013 1635   ACIDBASEDEF 3.0 (H) 04/09/2013 1924   O2SAT 97.0 04/09/2013 1924    Vent Settings: N/A  Condition -  Guarded  Family Communication  :  Spouse over  the phone 1/12  Code Status :  Full Code  Diet :  Diet Order            Diet regular Room service appropriate? Yes; Fluid consistency: Thin  Diet effective now               Disposition Plan  :  Remain hospitalized-tentative discharge on 1/13  Barriers to discharge: Hypoxia requiring O2 supplementation  Antimicorbials  :    Anti-infectives (From admission, onward)   Start     Dose/Rate Route Frequency Ordered Stop   07/20/19 1600  fluconazole (DIFLUCAN) IVPB 100 mg  Status:  Discontinued     100 mg 50 mL/hr over 60 Minutes Intravenous Every 24 hours 07/20/19 1440 07/20/19 1746   07/14/19 1330  azithromycin (ZITHROMAX) 500 mg in sodium chloride 0.9 % 250 mL IVPB  Status:  Discontinued     500 mg 250 mL/hr over 60 Minutes Intravenous Every 24 hours 07/13/19 1824 07/17/19 1117   07/14/19 1300  cefTRIAXone (ROCEPHIN) 1 g in sodium chloride 0.9 % 100 mL IVPB     1 g 200 mL/hr over 30 Minutes Intravenous Every 24 hours 07/13/19 1824 07/18/19 1215   07/14/19 1000  remdesivir 100 mg in sodium chloride 0.9 % 100 mL IVPB     100 mg 200 mL/hr over 30 Minutes Intravenous Daily 07/13/19 1615 07/17/19 0845   07/13/19 1630  remdesivir 200 mg in sodium chloride 0.9% 250 mL IVPB     200 mg 580 mL/hr over 30 Minutes Intravenous Once 07/13/19 1615 07/13/19 1806   07/13/19 1115  cefTRIAXone (ROCEPHIN) 1 g in sodium chloride 0.9 % 100 mL IVPB     1 g 200 mL/hr over 30 Minutes Intravenous  Once 07/13/19 1111 07/13/19 1340   07/13/19 1115  azithromycin (ZITHROMAX) 500 mg in sodium chloride 0.9 % 250 mL IVPB     500 mg 250 mL/hr over 60  Minutes Intravenous  Once 07/13/19 1111 07/13/19 1515      Inpatient Medications  Scheduled Meds: . dexamethasone (DECADRON) injection  6 mg Intravenous Q24H  . feeding supplement (ENSURE ENLIVE)  237 mL Oral TID BM  . fluticasone  2 spray Each Nare Daily  . metoprolol tartrate  12.5 mg Oral BID  . nystatin  5 mL Oral QID  . rivaroxaban  15 mg Oral BID  WC   Followed by  . [START ON 08/04/2019] rivaroxaban  20 mg Oral Q supper  . sodium chloride flush  3 mL Intravenous Q12H  . sodium chloride flush  3 mL Intravenous Q12H   Continuous Infusions: . sodium chloride     PRN Meds:.sodium chloride, acetaminophen, albuterol, bisacodyl, chlorpheniramine-HYDROcodone, guaiFENesin-dextromethorphan, ondansetron **OR** ondansetron (ZOFRAN) IV, oxyCODONE, polyethylene glycol, sodium chloride flush, sodium phosphate, witch hazel-glycerin   Time Spent in minutes  25   See all Orders from today for further details   Oren Binet M.D on 07/22/2019 at 1:38 PM  To page go to [email or web address] - use universal password  [facility] -  Office  [phone]    Objective:   Vitals:   07/22/19 0434 07/22/19 0453 07/22/19 0753 07/22/19 1141  BP: 100/78  100/78 118/65  Pulse: (!) 56 78 (!) 58 67  Resp: 16 20 19 20   Temp: (!) 97.5 F (36.4 C)  (!) 97.3 F (36.3 C) (!) 97.5 F (36.4 C)  TempSrc: Axillary  Oral Oral  SpO2: 95% 96% 95% 96%  Weight:      Height:        Wt Readings from Last 3 Encounters:  07/13/19 102 kg  06/20/19 98.9 kg  04/22/19 102 kg     Intake/Output Summary (Last 24 hours) at 07/22/2019 1338 Last data filed at 07/22/2019 0900 Gross per 24 hour  Intake 597 ml  Output 1275 ml  Net -678 ml     Physical Exam Gen Exam:Alert awake-not in any distress HEENT:atraumatic, normocephalic Chest: B/L clear to auscultation anteriorly CVS:S1S2 regular Abdomen:soft non tender, non distended Extremities:no edema Neurology: Non focal Skin: no rash   Data Review:    CBC Recent Labs  Lab 07/18/19 0436 07/19/19 0143 07/20/19 0544 07/21/19 0505 07/22/19 0445  WBC 9.2 10.0 8.7 9.3 10.3  HGB 11.5* 12.1* 12.1* 12.4* 12.9*  HCT 35.8* 38.0* 38.3* 38.8* 40.4  PLT 208 235 246 261 239  MCV 93.5 93.6 95.0 93.3 94.8  MCH 30.0 29.8 30.0 29.8 30.3  MCHC 32.1 31.8 31.6 32.0 31.9  RDW 14.4 14.4 14.6 14.6 14.6     Chemistries  Recent Labs  Lab 07/16/19 0711 07/17/19 0450 07/18/19 0436 07/19/19 0143 07/20/19 0544 07/22/19 0445  NA 141 138 139 140 139 137  K 4.1 3.9 4.0 4.2 4.7 4.4  CL 100 103 101 99 99 96*  CO2 29 27 29 30  32 28  GLUCOSE 157* 135* 154* 134* 100* 151*  BUN 27* 23 23 24* 22 26*  CREATININE 0.73 0.85 0.88 0.83 0.82 0.81  CALCIUM 8.1* 7.9* 8.2* 8.4* 8.5* 8.8*  MG 2.3 2.2 2.1  --   --   --   AST 77* 112* 76* 64* 46* 32  ALT 141* 192* 196* 205* 185* 174*  ALKPHOS 78 63 62 60 61 59  BILITOT 1.0 0.5 0.5 0.8 0.6 0.5   ------------------------------------------------------------------------------------------------------------------ No results for input(s): CHOL, HDL, LDLCALC, TRIG, CHOLHDL, LDLDIRECT in the last 72 hours.  Lab Results  Component Value Date   HGBA1C 5.6 04/09/2013   ------------------------------------------------------------------------------------------------------------------  No results for input(s): TSH, T4TOTAL, T3FREE, THYROIDAB in the last 72 hours.  Invalid input(s): FREET3 ------------------------------------------------------------------------------------------------------------------ Recent Labs    07/20/19 0544  FERRITIN 711*    Coagulation profile No results for input(s): INR, PROTIME in the last 168 hours.  No results for input(s): DDIMER in the last 72 hours.  Cardiac Enzymes No results for input(s): CKMB, TROPONINI, MYOGLOBIN in the last 168 hours.  Invalid input(s): CK ------------------------------------------------------------------------------------------------------------------    Component Value Date/Time   BNP 566.7 (H) 07/13/2019 1119    Micro Results Recent Results (from the past 240 hour(s))  Culture, blood (Routine X 2) w Reflex to ID Panel     Status: None   Collection Time: 07/13/19 11:40 AM   Specimen: BLOOD  Result Value Ref Range Status   Specimen Description BLOOD RIGHT ANTECUBITAL  Final   Special  Requests   Final    BOTTLES DRAWN AEROBIC AND ANAEROBIC Blood Culture results may not be optimal due to an inadequate volume of blood received in culture bottles   Culture   Final    NO GROWTH 5 DAYS Performed at [facility], [place] [address], [place], [place] [address]    Report Status 07/18/2019 FINAL  Final  Culture, blood (Routine X 2) w Reflex to ID Panel     Status: None   Collection Time: 07/13/19 11:50 AM   Specimen: BLOOD  Result Value Ref Range Status   Specimen Description BLOOD LEFT ANTECUBITAL  Final   Special Requests   Final    BOTTLES DRAWN AEROBIC AND ANAEROBIC Blood Culture results may not be optimal due to an inadequate volume of blood received in culture bottles   Culture   Final    NO GROWTH 5 DAYS Performed at [facility], [place] [address], [place], [place] [address]    Report Status 07/18/2019 FINAL  Final  Respiratory Panel by RT PCR (Flu A&B, Covid) - Nasopharyngeal Swab     Status: Abnormal   Collection Time: 07/13/19 11:50 AM   Specimen: Nasopharyngeal Swab  Result Value Ref Range Status   SARS Coronavirus 2 by RT PCR POSITIVE (A) NEGATIVE Final    Comment: RESULT CALLED TO, READ BACK BY AND VERIFIED WITH: MCKWON ON [id number] AT [id number] BY NFIELDS (NOTE) SARS-CoV-2 target nucleic acids are DETECTED. SARS-CoV-2 RNA is generally detectable in upper respiratory specimens  during the acute phase of infection. Positive results are indicative of the presence of the identified virus, but do not rule out bacterial infection or co-infection with other pathogens not detected by the test. Clinical correlation with patient history and other diagnostic information is necessary to determine patient infection status. The expected result is Negative. Fact Sheet for Patients:  PinkCheek.be Fact Sheet for Healthcare Providers: GravelBags.it This test is not yet approved or cleared by the [place] FDA and   has been authorized for detection and/or diagnosis of SARS-CoV-2 by FDA under an Emergency Use Authorization (EUA).  This EUA will remain in effect (meaning this test can be used) f or the duration of  the COVID-19 declaration under Section 564(b)(1) of the Act, 21 U.S.C. section 360bbb-3(b)(1), unless the authorization is terminated or revoked sooner.    Influenza A by PCR NEGATIVE NEGATIVE Final   Influenza B by PCR NEGATIVE NEGATIVE Final    Comment: (NOTE) The Xpert Xpress SARS-CoV-2/FLU/RSV assay is intended as an aid in  the diagnosis of influenza from Nasopharyngeal swab specimens and  should not be used as a  sole basis for treatment. Nasal washings and  aspirates are unacceptable for Xpert Xpress SARS-CoV-2/FLU/RSV  testing. Fact Sheet for Patients: PinkCheek.be Fact Sheet for Healthcare Providers: GravelBags.it This test is not yet approved or cleared by the [place] FDA and  has been authorized for detection and/or diagnosis of SARS-CoV-2 by  FDA under an Emergency Use Authorization (EUA). This EUA will remain  in effect (meaning this test can be used) for the duration of the  Covid-19 declaration under Section 564(b)(1) of the Act, 21  U.S.C. section 360bbb-3(b)(1), unless the authorization is  terminated or revoked. Performed at [facility], [place] [address], [place], [place] [address]     Radiology Reports CT Angio Chest PE W and/or Wo Contrast  Result Date: 07/13/2019 CLINICAL DATA:  Shortness of breath. Productive cough. EXAM: CT ANGIOGRAPHY CHEST WITH CONTRAST TECHNIQUE: Multidetector CT imaging of the chest was performed using the standard protocol during bolus administration of intravenous contrast. Multiplanar CT image reconstructions and MIPs were obtained to evaluate the vascular anatomy. CONTRAST:  7mL OMNIPAQUE IOHEXOL 350 MG/ML SOLN COMPARISON:  Radiograph of same day. FINDINGS:  Cardiovascular: Small filling defect is seen in lower lobe branch of right pulmonary artery consistent with small pulmonary embolus. Small cardiomegaly is noted. No pericardial effusion is noted. Atherosclerosis of thoracic aorta is noted without aneurysm formation. Mediastinum/Nodes: No enlarged mediastinal, hilar, or axillary lymph nodes. Thyroid gland, trachea, and esophagus demonstrate no significant findings. Lungs/Pleura: No pneumothorax or pleural effusion is noted. Large bilateral airspace opacities are noted concerning for multifocal pneumonia. Upper Abdomen: No acute abnormality. Musculoskeletal: Sternotomy wires are noted. No acute osseous abnormality is noted. Review of the MIP images confirms the above findings. IMPRESSION: 1. Small filling defect is seen in lower lobe branch of right pulmonary artery consistent with small pulmonary embolus. Critical Value/emergent results were called by telephone at the time of interpretation on 07/13/2019 at 3:21 pm to [place] , who verbally acknowledged these results. 2. Small cardiomegaly. 3. Large bilateral airspace opacities are noted concerning for multifocal pneumonia. 4. Aortic atherosclerosis. Aortic Atherosclerosis (ICD10-I70.0). Electronically Signed   By: Marijo Conception M.D.   On: 07/13/2019 15:22   DG Chest Portable 1 View  Result Date: 07/13/2019 CLINICAL DATA:  Hypoxemia. Likely COVID. Additional history provided: Patient's wife COVID positive. Generalized weakness, productive cough with yellowish green phlegm and fever for 2 weeks. EXAM: PORTABLE CHEST 1 VIEW COMPARISON:  Chest radiograph 06/16/2019 FINDINGS: Mild cardiomegaly, unchanged. Prior median sternotomy. Aortic atherosclerosis. Interval development of bilateral ill-defined airspace opacities with a right upper lobe predominance on the right, and upper to mid lung field predominance on the left. No evidence of pleural effusion or pneumothorax. No acute bony abnormality. Partially  visualized cervical ACDF hardware. IMPRESSION: Interval development of bilateral airspace opacities consistent with multifocal pneumonia. Cardiomegaly, unchanged Electronically Signed   By: Kellie Simmering DO   On: 07/13/2019 11:13

## 2019-07-22 NOTE — Discharge Instructions (Addendum)
Person Under Monitoring Name: Matthew Sullivan  Location: [address] [place] [address]   Infection Prevention Recommendations for Individuals Confirmed to have, or Being Evaluated for, 2019 Novel Coronavirus (COVID-19) Infection Who Receive Care at Home  Individuals who are confirmed to have, or are being evaluated for, COVID-19 should follow the prevention steps below until a healthcare provider or local or state health department says they can return to normal activities.  Stay home except to get medical care You should restrict activities outside your home, except for getting medical care. Do not go to work, school, or public areas, and do not use public transportation or taxis.  Call ahead before visiting your doctor Before your medical appointment, call the healthcare provider and tell them that you have, or are being evaluated for, COVID-19 infection. This will help the healthcare provider's office take steps to keep other people from getting infected. Ask your healthcare provider to call the local or state health department.  Monitor your symptoms Seek prompt medical attention if your illness is worsening (e.g., difficulty breathing). Before going to your medical appointment, call the healthcare provider and tell them that you have, or are being evaluated for, COVID-19 infection. Ask your healthcare provider to call the local or state health department.  Wear a facemask You should wear a facemask that covers your nose and mouth when you are in the same room with other people and when you visit a healthcare provider. People who live with or visit you should also wear a facemask while they are in the same room with you.  Separate yourself from other people in your home As much as possible, you should stay in a different room from other people in your home. Also, you should use a separate bathroom, if available.  Avoid sharing household items You  should not share dishes, drinking glasses, cups, eating utensils, towels, bedding, or other items with other people in your home. After using these items, you should wash them thoroughly with soap and water.  Cover your coughs and sneezes Cover your mouth and nose with a tissue when you cough or sneeze, or you can cough or sneeze into your sleeve. Throw used tissues in a lined trash can, and immediately wash your hands with soap and water for at least 20 seconds or use an alcohol-based hand rub.  Wash your [facility] your hands often and thoroughly with soap and water for at least 20 seconds. You can use an alcohol-based hand sanitizer if soap and water are not available and if your hands are not visibly dirty. Avoid touching your eyes, nose, and mouth with unwashed hands.   Prevention Steps for Caregivers and Household Members of Individuals Confirmed to have, or Being Evaluated for, COVID-19 Infection Being Cared for in the Home  If you live with, or provide care at home for, a person confirmed to have, or being evaluated for, COVID-19 infection please follow these guidelines to prevent infection:  Follow healthcare provider's instructions Make sure that you understand and can help the patient follow any healthcare provider instructions for all care.  Provide for the patient's basic needs You should help the patient with basic needs in the home and provide support for getting groceries, prescriptions, and other personal needs.  Monitor the patient's symptoms If they are getting sicker, call his or her medical provider and tell them that the patient has, or is being evaluated for, COVID-19 infection. This  will help the healthcare provider's office take steps to keep other people from getting infected. Ask the healthcare provider to call the local or state health department.  Limit the number of people who have contact with the patient  If possible, have only one caregiver for the  patient.  Other household members should stay in another home or place of residence. If this is not possible, they should stay  in another room, or be separated from the patient as much as possible. Use a separate bathroom, if available.  Restrict visitors who do not have an essential need to be in the home.  Keep older adults, very young children, and other sick people away from the patient Keep older adults, very young children, and those who have compromised immune systems or chronic health conditions away from the patient. This includes people with chronic heart, lung, or kidney conditions, diabetes, and cancer.  Ensure good ventilation Make sure that shared spaces in the home have good air flow, such as from an air conditioner or an opened window, weather permitting.  Wash your hands often  Wash your hands often and thoroughly with soap and water for at least 20 seconds. You can use an alcohol based hand sanitizer if soap and water are not available and if your hands are not visibly dirty.  Avoid touching your eyes, nose, and mouth with unwashed hands.  Use disposable paper towels to dry your hands. If not available, use dedicated cloth towels and replace them when they become wet.  Wear a facemask and gloves  Wear a disposable facemask at all times in the room and gloves when you touch or have contact with the patient's blood, body fluids, and/or secretions or excretions, such as sweat, saliva, sputum, nasal mucus, vomit, urine, or feces.  Ensure the mask fits over your nose and mouth tightly, and do not touch it during use.  Throw out disposable facemasks and gloves after using them. Do not reuse.  Wash your hands immediately after removing your facemask and gloves.  If your personal clothing becomes contaminated, carefully remove clothing and launder. Wash your hands after handling contaminated clothing.  Place all used disposable facemasks, gloves, and other waste in a lined  container before disposing them with other household waste.  Remove gloves and wash your hands immediately after handling these items.  Do not share dishes, glasses, or other household items with the patient  Avoid sharing household items. You should not share dishes, drinking glasses, cups, eating utensils, towels, bedding, or other items with a patient who is confirmed to have, or being evaluated for, COVID-19 infection.  After the person uses these items, you should wash them thoroughly with soap and water.  Wash laundry thoroughly  Immediately remove and wash clothes or bedding that have blood, body fluids, and/or secretions or excretions, such as sweat, saliva, sputum, nasal mucus, vomit, urine, or feces, on them.  Wear gloves when handling laundry from the patient.  Read and follow directions on labels of laundry or clothing items and detergent. In general, wash and dry with the warmest temperatures recommended on the label.  Clean all areas the individual has used often  Clean all touchable surfaces, such as counters, tabletops, doorknobs, bathroom fixtures, toilets, phones, keyboards, tablets, and bedside tables, every day. Also, clean any surfaces that may have blood, body fluids, and/or secretions or excretions on them.  Wear gloves when cleaning surfaces the patient has come in contact with.  Use a diluted bleach solution (  e.g., dilute bleach with 1 part bleach and 10 parts water) or a household disinfectant with a label that says EPA-registered for coronaviruses. To make a bleach solution at home, add 1 tablespoon of bleach to 1 quart (4 cups) of water. For a larger supply, add  cup of bleach to 1 gallon (16 cups) of water.  Read labels of cleaning products and follow recommendations provided on product labels. Labels contain instructions for safe and effective use of the cleaning product including precautions you should take when applying the product, such as wearing gloves or  eye protection and making sure you have good ventilation during use of the product.  Remove gloves and wash hands immediately after cleaning.  Monitor yourself for signs and symptoms of illness Caregivers and household members are considered close contacts, should monitor their health, and will be asked to limit movement outside of the home to the extent possible. Follow the monitoring steps for close contacts listed on the symptom monitoring form.   ? If you have additional questions, contact your local health department or call the epidemiologist on call at [phone] (available 24/7). ? This guidance is subject to change. For the most up-to-date guidance from [facility], please refer to their website: [email or web address] on my medicine - XARELTO (rivaroxaban)  This medication education was reviewed with me or my healthcare representative as part of my discharge preparation.   WHY WAS XARELTO PRESCRIBED FOR YOU? Xarelto was prescribed to treat blood clots that may have been found in the veins of your legs (deep vein thrombosis) or in your lungs (pulmonary embolism) and to reduce the risk of them occurring again.  What do you need to know about Xarelto? The starting dose is one 15 mg tablet taken TWICE daily with food for the FIRST 21 DAYS then on 08/04/19  the dose is changed to one 20 mg tablet taken ONCE A DAY with your evening meal.  DO NOT stop taking Xarelto without talking to the health care provider who prescribed the medication.  Refill your prescription for 20 mg tablets before you run out.  After discharge, you should have regular check-up appointments with your healthcare provider that is prescribing your Xarelto.  In the future your dose may need to be changed if your kidney function changes by a significant amount.  What do you do if you miss a dose? If you are taking Xarelto TWICE DAILY and you miss a dose,  take it as soon as you remember. You may take two 15 mg tablets (total 30 mg) at the same time then resume your regularly scheduled 15 mg twice daily the next day.  If you are taking Xarelto ONCE DAILY and you miss a dose, take it as soon as you remember on the same day then continue your regularly scheduled once daily regimen the next day. Do not take two doses of Xarelto at the same time.   Important Safety Information Xarelto is a blood thinner medicine that can cause bleeding. You should call your healthcare provider right away if you experience any of the following: ? Bleeding from an injury or your nose that does not stop. ? Unusual colored urine (red or dark brown) or unusual colored stools (red or black). ? Unusual bruising for unknown reasons. ? A serious fall or if you hit your head (even if there is no bleeding).  Some medicines may interact with Xarelto and might increase your risk of bleeding while on Xarelto. To help avoid this, consult your  healthcare provider or pharmacist prior to using any new prescription or non-prescription medications, including herbals, vitamins, non-steroidal anti-inflammatory drugs (NSAIDs) and supplements.  This website has more information on Xarelto: [email or web address]

## 2019-07-23 ENCOUNTER — Telehealth: Payer: Self-pay

## 2019-07-23 DIAGNOSIS — J069 Acute upper respiratory infection, unspecified: Secondary | ICD-10-CM

## 2019-07-23 LAB — CBC
HCT: 43.1 % (ref 39.0–52.0)
Hemoglobin: 13.9 g/dL (ref 13.0–17.0)
MCH: 30.7 pg (ref 26.0–34.0)
MCHC: 32.3 g/dL (ref 30.0–36.0)
MCV: 95.1 fL (ref 80.0–100.0)
Platelets: 276 10*3/uL (ref 150–400)
RBC: 4.53 MIL/uL (ref 4.22–5.81)
RDW: 14.8 % (ref 11.5–15.5)
WBC: 10.4 10*3/uL (ref 4.0–10.5)
nRBC: 0 % (ref 0.0–0.2)

## 2019-07-23 MED ORDER — ENSURE ENLIVE PO LIQD: 237.0000 mL | Freq: Three times a day (TID) | ORAL | 0 refills | Status: DC

## 2019-07-23 MED ORDER — RIVAROXABAN 20 MG PO TABS: 20.0000 mg | ORAL_TABLET | Freq: Every day | ORAL | Status: DC

## 2019-07-23 MED ORDER — WITCH HAZEL-GLYCERIN EX PADS: MEDICATED_PAD | CUTANEOUS | 12 refills | Status: DC | PRN

## 2019-07-23 MED ORDER — RIVAROXABAN 15 MG PO TABS: 15.0000 mg | ORAL_TABLET | Freq: Two times a day (BID) | ORAL | 0 refills | Status: DC

## 2019-07-23 MED ORDER — HYDROCORTISONE ACETATE 25 MG RE SUPP: 25.0000 mg | Freq: Two times a day (BID) | RECTAL | 0 refills | Status: DC | PRN

## 2019-07-23 MED ORDER — BENZONATATE 100 MG PO CAPS: 100.0000 mg | ORAL_CAPSULE | Freq: Three times a day (TID) | ORAL | 0 refills | Status: DC | PRN

## 2019-07-23 MED ORDER — ALBUTEROL SULFATE HFA 108 (90 BASE) MCG/ACT IN AERS: 2.0000 | INHALATION_SPRAY | RESPIRATORY_TRACT | 0 refills | Status: DC | PRN

## 2019-07-23 NOTE — Care Management Important Message (Signed)
Important Message  Patient Details  Name: Matthew Sullivan MRN: [id number] Date of Birth: [birth year]   Medicare Important Message Given:  Yes - Important Message mailed due to current National Emergency  Verbal consent obtained due to current National Emergency    Contact Name: Khalid Bernardy Call Date: 07/23/19  Time: 1144 Phone: [phone] Outcome: Spoke with contact Important Message mailed to: Patient address on file    Delorse Lek 07/23/2019, 11:44 AM

## 2019-07-23 NOTE — Progress Notes (Signed)
Physical Therapy Treatment Patient Details Name: Matthew Sullivan MRN: [id number] DOB: 09/04/1945 Today's Date: 07/23/2019    History of Present Illness Pt is a 74 y.o. male admitted 07/13/19 with SOB; found to have acute hypoxic respiratory failure secondary to COVID PNA and PE; pt on xarelto at home but not fully compliant. Being treated with IV heparin. Pt with some mild hematochezia and hemorrhoids. Other PMH includes HTN, PAF, obesity, TIA, cervical OA, melanoma.    PT Comments    Sats 97% on 2L at rest; only required incr to 3L with ambulation (50 ft x 3 with standing rest breaks) with sats 88-93%. Instructed in standing exercises for strength and balance.    Follow Up Recommendations  Home health PT;Supervision for mobility/OOB(pt may decline)     Equipment Recommendations  None recommended by PT    Recommendations for Other Services       Precautions / Restrictions Precautions Precautions: Fall;Other (comment) Precaution Comments: Watch SpO2 Restrictions Weight Bearing Restrictions: No    Mobility  Bed Mobility               General bed mobility comments: up in chair  Transfers Overall transfer level: Modified independent Equipment used: None Transfers: Sit to/from Stand Sit to Stand: Modified independent (Device/Increase time)         General transfer comment: pt very mindful of lines; no imbalance  Ambulation/Gait Ambulation/Gait assistance: Min guard;Supervision Gait Distance (Feet): 150 Feet(standing rest x 3) Assistive device: None Gait Pattern/deviations: Step-through pattern;Decreased stride length;Step-to pattern;Wide base of support     General Gait Details: pt with fused left ankle causing shortened step length; naturally widened his BOS when cued to decr velocity to maintain his sats   Stairs             Wheelchair Mobility    Modified Rankin (Stroke Patients Only)       Balance Overall balance assessment: Needs assistance   Sitting balance-Leahy Scale: Good     Standing balance support: No upper extremity supported Standing balance-Leahy Scale: Fair Standing balance comment: Can static stand without UE support; dynamic activity at counter with light UE support                            Cognition Arousal/Alertness: Awake/alert Behavior During Therapy: WFL for tasks assessed/performed Overall Cognitive Status: Within Functional Limits for tasks assessed                                        Exercises General Exercises - Lower Extremity Hip ABduction/ADduction: Strengthening;Both;5 reps;Standing Other Exercises Other Exercises: pt educated in standing at counter or bedrail (bed elevated) for squats, marching, heel raises    General Comments        Pertinent Vitals/Pain Pain Assessment: Faces Faces Pain Scale: Hurts a little bit Pain Location: Rectum (hemorrhoids) Pain Descriptors / Indicators: Constant Pain Intervention(s): Limited activity within patient's tolerance    Home Living                      Prior Function            PT Goals (current goals can now be found in the care plan section) Acute Rehab PT Goals Patient Stated Goal: return to where I was before this began Time For Goal Achievement: 08/01/19 Potential to Achieve Goals: Good  Progress towards PT goals: Progressing toward goals    Frequency    Min 3X/week      PT Plan Current plan remains appropriate    Co-evaluation              AM-PAC PT "6 Clicks" Mobility   Outcome Measure  Help needed turning from your back to your side while in a flat bed without using bedrails?: None Help needed moving from lying on your back to sitting on the side of a flat bed without using bedrails?: None Help needed moving to and from a bed to a chair (including a wheelchair)?: None Help needed standing up from a chair using your arms (e.g., wheelchair or bedside chair)?: None Help needed  to walk in hospital room?: A Little Help needed climbing 3-5 steps with a railing? : A Little 6 Click Score: 22    End of Session Equipment Utilized During Treatment: Oxygen Activity Tolerance: Patient tolerated treatment well Patient left: in chair;with call bell/phone within reach   PT Visit Diagnosis: Other abnormalities of gait and mobility (R26.89);Muscle weakness (generalized) (M62.81)     Time: [id number] PT Time Calculation (min) (ACUTE ONLY): 28 min  Charges:  $Gait Training: 8-22 mins $Therapeutic Exercise: 8-22 mins                      Arby Barrette, PT Pager [phone]    Rexanne Mano 07/23/2019, 10:31 AM

## 2019-07-23 NOTE — Discharge Summary (Signed)
PATIENT DETAILS Name: Matthew Sullivan Age: 74 y.o. Sex: male Date of Birth: April 28, 1946 MRN: [id number]. Admitting Physician: Karmen Bongo, MD [id number], Elveria Rising, MD  Admit Date: 07/13/2019 Discharge date: 07/23/2019  Recommendations for Outpatient Follow-up:  1. Follow up with PCP in 1-2 weeks 2. Please obtain CMP/CBC in one week 3. Repeat Chest Xray in 4-6 week 4. Please reassess if patient requires home O2 at next visit with PCP.  Admitted From:  Home  Disposition: Home with home health services.   Home Health: Yes  Equipment/Devices: Oxygen-2 L at rest and 3 L with ambulation (per physical therapy note today)  Discharge Condition: Stable  CODE STATUS: FULL CODE  Diet recommendation:  Diet Order            Diet - low sodium heart healthy        Diet regular Room service appropriate? Yes; Fluid consistency: Thin  Diet effective now               Brief Summary: See H&P, Labs, Consult and Test reports for all details in brief, Patient is a 74 y.o. male with PMHx of ofHTN; HLD; andh/o atrial myxoma on Xareltopresented with  SOB-found to have acute hypoxic resp failure 2/2 COVID PNA and PE.  Patient's hospital course was long-with slow improvement-by day of discharge was down to 2 L at rest and requiring approximately 3-4 L of oxygen with ambulation.  Discharged home with home O2-see below for further details.  Brief Hospital Course: Acute Hypoxic Resp Failure due to Covid 19 Viral pneumonia, concurrent bacterial pneumonia and pulmonary embolism:  Had severe hypoxemia on initial presentation-treated with steroids, remdesivir and 1 dose of convalescent plasma.  Also was on Rocephin and Zithromax.  He has gradually but slowly improved-he is now down to just 2 L at rest-Per physical therapy note-required just 3 L of oxygen to maintain O2 saturations this morning.  Stable for discharge with home health services today.  Has finished a course of steroids, remdesivir.   Patient will need to follow-up with primary care practitioner in a few weeks to see if he still requires home O2.  COVID-19 Labs:  Recent Labs    07/21/19 0505 07/22/19 0445  CRP 1.7* 0.9    Lab Results  Component Value Date   SARSCOV2NAA POSITIVE (A) 07/13/2019     COVID-19 Medications: Steroids:1/3>> Remdesivir:1/3>>1/7 Convalescent Plasma:1/4 x 1  Other medications: Diuretics: Euvolemic-no need for Lasix today. Antibiotics: Rocephin: 1/3>>1/8 Zithromax:1/3>>1/6   Acute [id number] branch and right lower lobe.On Xarelto at home, he has not been very compliant-likely PE provoked from COVID 19 and non compliance to xarelto. Suspect that this is not Xarelto failure.Managed with IV heparin-has been transitioned to Xarelto on 1/10.  HTN: controlled-continue lopressor  Transaminitis:2/2 covid-continues to have elevation in ALT-but is downtrending-now off remdesivir-follow closely in the outpatient setting.  HLD:-hold Lipitor given elevated LFT's  H/o atrial myxoma/PAF:-hx of myxoma removed in 2014 and brief period of post-op afib that resolved after electrical cardioversion later that year. On anticoagulation-see above  Hemorrhoidal bleed: Continues intermittently-continue Anusol suppository.  Hemoglobin stable.  Nutrition Problem: Nutrition Problem: Increased nutrient needs Etiology: acute illness(COVID-19) Signs/Symptoms: estimated needs Interventions: Ensure Enlive (each supplement provides 350kcal and 20 grams of protein), Magic cup  Obesity: Estimated body mass index is 30.5 kg/m as calculated from the following:   Height as of this encounter: 6' (1.829 m).   Weight as of this encounter: 102 kg.     Procedures/Studies: None  Discharge Diagnoses:  Principal Problem:   Acute respiratory disease due to COVID-19 virus Active Problems:   Pure hypercholesterolemia   HTN (hypertension)   Myxoma of heart   PAF (paroxysmal atrial fibrillation) (HCC)    Current use of long term anticoagulation   Pulmonary embolism [facility])   Discharge Instructions:    Person Under Monitoring Name: Matthew Sullivan  Location: [place] [address]   Infection Prevention Recommendations for Individuals Confirmed to have, or Being Evaluated for, 2019 Novel Coronavirus (COVID-19) Infection Who Receive Care at Home  Individuals who are confirmed to have, or are being evaluated for, COVID-19 should follow the prevention steps below until a healthcare provider or local or state health department says they can return to normal activities.  Stay home except to get medical care You should restrict activities outside your home, except for getting medical care. Do not go to work, school, or public areas, and do not use public transportation or taxis.  Call ahead before visiting your doctor Before your medical appointment, call the healthcare provider and tell them that you have, or are being evaluated for, COVID-19 infection. This will help the healthcare provider's office take steps to keep other people from getting infected. Ask your healthcare provider to call the local or state health department.  Monitor your symptoms Seek prompt medical attention if your illness is worsening (e.g., difficulty breathing). Before going to your medical appointment, call the healthcare provider and tell them that you have, or are being evaluated for, COVID-19 infection. Ask your healthcare provider to call the local or state health department.  Wear a facemask You should wear a facemask that covers your nose and mouth when you are in the same room with other people and when you visit a healthcare provider. People who live with or visit you should also wear a facemask while they are in the same room with you.  Separate yourself from other people in your home As much as possible, you should stay in a different room from other people in your home. Also, you  should use a separate bathroom, if available.  Avoid sharing household items You should not share dishes, drinking glasses, cups, eating utensils, towels, bedding, or other items with other people in your home. After using these items, you should wash them thoroughly with soap and water.  Cover your coughs and sneezes Cover your mouth and nose with a tissue when you cough or sneeze, or you can cough or sneeze into your sleeve. Throw used tissues in a lined trash can, and immediately wash your hands with soap and water for at least 20 seconds or use an alcohol-based hand rub.  Wash your [facility] your hands often and thoroughly with soap and water for at least 20 seconds. You can use an alcohol-based hand sanitizer if soap and water are not available and if your hands are not visibly dirty. Avoid touching your eyes, nose, and mouth with unwashed hands.   Prevention Steps for Caregivers and Household Members of Individuals Confirmed to have, or Being Evaluated for, COVID-19 Infection Being Cared for in the Home  If you live with, or provide care at home for, a person confirmed to have, or being evaluated for, COVID-19 infection please follow these guidelines to prevent infection:  Follow healthcare provider's instructions Make sure that you understand and can help the patient follow any healthcare provider instructions for all care.  Provide for the patient's basic needs You should help the  patient with basic needs in the home and provide support for getting groceries, prescriptions, and other personal needs.  Monitor the patient's symptoms If they are getting sicker, call his or her medical provider and tell them that the patient has, or is being evaluated for, COVID-19 infection. This will help the healthcare provider's office take steps to keep other people from getting infected. Ask the healthcare provider to call the local or state health department.  Limit the number of  people who have contact with the patient  If possible, have only one caregiver for the patient.  Other household members should stay in another home or place of residence. If this is not possible, they should stay  in another room, or be separated from the patient as much as possible. Use a separate bathroom, if available.  Restrict visitors who do not have an essential need to be in the home.  Keep older adults, very young children, and other sick people away from the patient Keep older adults, very young children, and those who have compromised immune systems or chronic health conditions away from the patient. This includes people with chronic heart, lung, or kidney conditions, diabetes, and cancer.  Ensure good ventilation Make sure that shared spaces in the home have good air flow, such as from an air conditioner or an opened window, weather permitting.  Wash your hands often  Wash your hands often and thoroughly with soap and water for at least 20 seconds. You can use an alcohol based hand sanitizer if soap and water are not available and if your hands are not visibly dirty.  Avoid touching your eyes, nose, and mouth with unwashed hands.  Use disposable paper towels to dry your hands. If not available, use dedicated cloth towels and replace them when they become wet.  Wear a facemask and gloves  Wear a disposable facemask at all times in the room and gloves when you touch or have contact with the patient's blood, body fluids, and/or secretions or excretions, such as sweat, saliva, sputum, nasal mucus, vomit, urine, or feces.  Ensure the mask fits over your nose and mouth tightly, and do not touch it during use.  Throw out disposable facemasks and gloves after using them. Do not reuse.  Wash your hands immediately after removing your facemask and gloves.  If your personal clothing becomes contaminated, carefully remove clothing and launder. Wash your hands after handling  contaminated clothing.  Place all used disposable facemasks, gloves, and other waste in a lined container before disposing them with other household waste.  Remove gloves and wash your hands immediately after handling these items.  Do not share dishes, glasses, or other household items with the patient  Avoid sharing household items. You should not share dishes, drinking glasses, cups, eating utensils, towels, bedding, or other items with a patient who is confirmed to have, or being evaluated for, COVID-19 infection.  After the person uses these items, you should wash them thoroughly with soap and water.  Wash laundry thoroughly  Immediately remove and wash clothes or bedding that have blood, body fluids, and/or secretions or excretions, such as sweat, saliva, sputum, nasal mucus, vomit, urine, or feces, on them.  Wear gloves when handling laundry from the patient.  Read and follow directions on labels of laundry or clothing items and detergent. In general, wash and dry with the warmest temperatures recommended on the label.  Clean all areas the individual has used often  Clean all touchable surfaces, such as  counters, tabletops, doorknobs, bathroom fixtures, toilets, phones, keyboards, tablets, and bedside tables, every day. Also, clean any surfaces that may have blood, body fluids, and/or secretions or excretions on them.  Wear gloves when cleaning surfaces the patient has come in contact with.  Use a diluted bleach solution (e.g., dilute bleach with 1 part bleach and 10 parts water) or a household disinfectant with a label that says EPA-registered for coronaviruses. To make a bleach solution at home, add 1 tablespoon of bleach to 1 quart (4 cups) of water. For a larger supply, add  cup of bleach to 1 gallon (16 cups) of water.  Read labels of cleaning products and follow recommendations provided on product labels. Labels contain instructions for safe and effective use of the cleaning  product including precautions you should take when applying the product, such as wearing gloves or eye protection and making sure you have good ventilation during use of the product.  Remove gloves and wash hands immediately after cleaning.  Monitor yourself for signs and symptoms of illness Caregivers and household members are considered close contacts, should monitor their health, and will be asked to limit movement outside of the home to the extent possible. Follow the monitoring steps for close contacts listed on the symptom monitoring form.   ? If you have additional questions, contact your local health department or call the epidemiologist on call at [phone] (available 24/7). ? This guidance is subject to change. For the most up-to-date guidance from CDC, please refer to their website: YouBlogs.pl    Activity:  As tolerated with Full fall precautions use walker/cane & assistance as needed   Discharge Instructions    Ambulatory referral to Pulmonology   Complete by: As directed    Severe Covid pneumonia-improved but going home on O2-please ensure follow-up with either Dr. Chase Caller or Dr. Vaughan Browner   Call MD for:  difficulty breathing, headache or visual disturbances   Complete by: As directed    Call MD for:  extreme fatigue   Complete by: As directed    Call MD for:  persistant nausea and vomiting   Complete by: As directed    Diet - low sodium heart healthy   Complete by: As directed    Discharge instructions   Complete by: As directed    Follow with Primary MD  Tonia Ghent, MD in 1-2 weeks  Please get a complete blood count and chemistry panel checked by your Primary MD at your next visit, and again as instructed by your Primary MD.  Get Medicines reviewed and adjusted: Please take all your medications with you for your next visit with your Primary MD  Laboratory/radiological data: Please request  your Primary MD to go over all hospital tests and procedure/radiological results at the follow up, please ask your Primary MD to get all Hospital records sent to his/her office.  In some cases, they will be blood work, cultures and biopsy results pending at the time of your discharge. Please request that your primary care M.D. follows up on these results.  Also Note the following: If you experience worsening of your admission symptoms, develop shortness of breath, life threatening emergency, suicidal or homicidal thoughts you must seek medical attention immediately by calling 911 or calling your MD immediately  if symptoms less severe.  You must read complete instructions/literature along with all the possible adverse reactions/side effects for all the Medicines you take and that have been prescribed to you. Take any new Medicines after you have  completely understood and accpet all the possible adverse reactions/side effects.   Do not drive when taking Pain medications or sleeping medications (Benzodaizepines)  Do not take more than prescribed Pain, Sleep and Anxiety Medications. It is not advisable to combine anxiety,sleep and pain medications without talking with your primary care practitioner  Special Instructions: If you have smoked or chewed Tobacco  in the last 2 yrs please stop smoking, stop any regular Alcohol  and or any Recreational drug use.  Wear Seat belts while driving.  Please note: You were cared for by a hospitalist during your hospital stay. Once you are discharged, your primary care physician will handle any further medical issues. Please note that NO REFILLS for any discharge medications will be authorized once you are discharged, as it is imperative that you return to your primary care physician (or establish a relationship with a primary care physician if you do not have one) for your post hospital discharge needs so that they can reassess your need for medications and monitor  your lab values.   1.)  3 weeks of isolation from 07/13/2019  2.)  Please ask your primary care practitioner to reassess whether you require oxygen-at next visit.  3.)  You are on Xarelto 15 mg twice daily for a total of 15 days starting from 1/10-on 1/25-resume taking your usual dosing of Xarelto 20 mg once daily.   Increase activity slowly   Complete by: As directed      Allergies as of 07/23/2019   No Known Allergies     Medication List    STOP taking these medications   atorvastatin 80 MG tablet Commonly known as: LIPITOR     TAKE these medications   albuterol 108 (90 Base) MCG/ACT inhaler Commonly known as: VENTOLIN HFA Inhale 2 puffs into the lungs every 2 (two) hours as needed for wheezing or shortness of breath.   benzonatate 100 MG capsule Commonly known as: Tessalon Perles Take 1 capsule (100 mg total) by mouth 3 (three) times daily as needed for cough.   feeding supplement (ENSURE ENLIVE) Liqd Take 237 mLs by mouth 3 (three) times daily between meals.   hydrochlorothiazide 25 MG tablet Commonly known as: HYDRODIURIL TAKE 1 TABLET (25 MG TOTAL) BY MOUTH DAILY. PLEASE MAKE YEARLY APPT WITH DR. Acie Fredrickson FOR FEBRUARY BEFORE ANYMORE REFILLS. 1ST ATTEMPT   hydrocortisone 25 MG suppository Commonly known as: ANUSOL-HC Place 1 suppository (25 mg total) rectally 2 (two) times daily as needed for hemorrhoids or anal itching.   metoprolol tartrate 25 MG tablet Commonly known as: LOPRESSOR TAKE 1/2 TABLET BY MOUTH 2 TIMES DAILY. What changed: See the new instructions.   potassium chloride SA 20 MEQ tablet Commonly known as: KLOR-CON TAKE 1 TABLET BY MOUTH 2 TIMES DAILY.   Rivaroxaban 15 MG Tabs tablet Commonly known as: XARELTO Take 1 tablet (15 mg total) by mouth 2 (two) times daily with a meal. What changed: You were already taking a medication with the same name, and this prescription was added. Make sure you understand how and when to take each.   rivaroxaban 20  MG Tabs tablet Commonly known as: Xarelto Take 1 tablet (20 mg total) by mouth daily with supper. Continue 15 mg twice daily for another 23 more doses-and on 1/25-resume 20 mg of Xarelto with dinner. Start taking on: August 04, 2019 What changed:   See the new instructions.  These instructions start on August 04, 2019. If you are unsure what to do until then,  ask your doctor or other care provider.   witch hazel-glycerin pad Commonly known as: TUCKS Apply topically as needed for itching.            Durable Medical Equipment  (From admission, onward)         Start     Ordered   07/23/19 1107  For home use only DME oxygen  Once    Question Answer Comment  Length of Need 6 Months   Mode or (Route) Nasal cannula   Liters per Minute 3   Frequency Continuous (stationary and portable oxygen unit needed)   Oxygen delivery system Gas      07/23/19 1106         Follow-up Information    [facility] Follow up.   Specialty: Home Health Services Why: agency will provide home health physical therapy. agency will call you to schedule first visit. Contact information: [place] [id number] [phone]        Tonia Ghent, MD. Schedule an appointment as soon as possible for a visit in 1 week(s).   Specialty: Family Medicine Contact information: [place] [address] [phone]        Nahser, Wonda Cheng, MD. Schedule an appointment as soon as possible for a visit in 1 month(s).   Specialty: Cardiology Contact information: [address] [place] [address] [phone]          No Known Allergies   Consultations:   None     Other Procedures/Studies: CT Angio Chest PE W and/or Wo Contrast  Result Date: 07/13/2019 CLINICAL DATA:  Shortness of breath. Productive cough. EXAM: CT ANGIOGRAPHY CHEST WITH CONTRAST TECHNIQUE: Multidetector CT imaging of the chest was performed using the  standard protocol during bolus administration of intravenous contrast. Multiplanar CT image reconstructions and MIPs were obtained to evaluate the vascular anatomy. CONTRAST:  63mL OMNIPAQUE IOHEXOL 350 MG/ML SOLN COMPARISON:  Radiograph of same day. FINDINGS: Cardiovascular: Small filling defect is seen in lower lobe branch of right pulmonary artery consistent with small pulmonary embolus. Small cardiomegaly is noted. No pericardial effusion is noted. Atherosclerosis of thoracic aorta is noted without aneurysm formation. Mediastinum/Nodes: No enlarged mediastinal, hilar, or axillary lymph nodes. Thyroid gland, trachea, and esophagus demonstrate no significant findings. Lungs/Pleura: No pneumothorax or pleural effusion is noted. Large bilateral airspace opacities are noted concerning for multifocal pneumonia. Upper Abdomen: No acute abnormality. Musculoskeletal: Sternotomy wires are noted. No acute osseous abnormality is noted. Review of the MIP images confirms the above findings. IMPRESSION: 1. Small filling defect is seen in lower lobe branch of right pulmonary artery consistent with small pulmonary embolus. Critical Value/emergent results were called by telephone at the time of interpretation on 07/13/2019 at 3:21 pm to [place] , who verbally acknowledged these results. 2. Small cardiomegaly. 3. Large bilateral airspace opacities are noted concerning for multifocal pneumonia. 4. Aortic atherosclerosis. Aortic Atherosclerosis (ICD10-I70.0). Electronically Signed   By: Marijo Conception M.D.   On: 07/13/2019 15:22   DG Chest Portable 1 View  Result Date: 07/13/2019 CLINICAL DATA:  Hypoxemia. Likely COVID. Additional history provided: Patient's wife COVID positive. Generalized weakness, productive cough with yellowish green phlegm and fever for 2 weeks. EXAM: PORTABLE CHEST 1 VIEW COMPARISON:  Chest radiograph 06/16/2019 FINDINGS: Mild cardiomegaly, unchanged. Prior median sternotomy. Aortic  atherosclerosis. Interval development of bilateral ill-defined airspace opacities with a right upper lobe predominance on the right, and upper to mid lung  field predominance on the left. No evidence of pleural effusion or pneumothorax. No acute bony abnormality. Partially visualized cervical ACDF hardware. IMPRESSION: Interval development of bilateral airspace opacities consistent with multifocal pneumonia. Cardiomegaly, unchanged Electronically Signed   By: Kellie Simmering DO   On: 07/13/2019 11:13     TODAY-DAY OF DISCHARGE:  Subjective:   [name] today has no headache,no chest abdominal pain,no new weakness tingling or numbness, feels much better wants to go home today.   Objective:   Blood pressure 109/67, pulse 61, temperature 97.8 F (36.6 C), temperature source Oral, resp. rate 20, height 6' (1.829 m), weight 102 kg, SpO2 99 %.  Intake/Output Summary (Last 24 hours) at 07/23/2019 1126 Last data filed at 07/23/2019 0748 Gross per 24 hour  Intake 220 ml  Output 1570 ml  Net -1350 ml   Filed Weights   07/13/19 1500 07/13/19 1536  Weight: 102 kg 102 kg    Exam: Awake Alert, Oriented *3, No new F.N deficits, Normal affect [place].AT,PERRAL Supple Neck,No JVD, No cervical lymphadenopathy appriciated.  Symmetrical Chest wall movement, Good air movement bilaterally, CTAB RRR,No Gallops,Rubs or new Murmurs, No Parasternal Heave +ve B.Sounds, Abd Soft, Non tender, No organomegaly appriciated, No rebound -guarding or rigidity. No Cyanosis, Clubbing or edema, No new Rash or bruise   PERTINENT RADIOLOGIC STUDIES: CT Angio Chest PE W and/or Wo Contrast  Result Date: 07/13/2019 CLINICAL DATA:  Shortness of breath. Productive cough. EXAM: CT ANGIOGRAPHY CHEST WITH CONTRAST TECHNIQUE: Multidetector CT imaging of the chest was performed using the standard protocol during bolus administration of intravenous contrast. Multiplanar CT image reconstructions and MIPs were obtained to evaluate the  vascular anatomy. CONTRAST:  77mL OMNIPAQUE IOHEXOL 350 MG/ML SOLN COMPARISON:  Radiograph of same day. FINDINGS: Cardiovascular: Small filling defect is seen in lower lobe branch of right pulmonary artery consistent with small pulmonary embolus. Small cardiomegaly is noted. No pericardial effusion is noted. Atherosclerosis of thoracic aorta is noted without aneurysm formation. Mediastinum/Nodes: No enlarged mediastinal, hilar, or axillary lymph nodes. Thyroid gland, trachea, and esophagus demonstrate no significant findings. Lungs/Pleura: No pneumothorax or pleural effusion is noted. Large bilateral airspace opacities are noted concerning for multifocal pneumonia. Upper Abdomen: No acute abnormality. Musculoskeletal: Sternotomy wires are noted. No acute osseous abnormality is noted. Review of the MIP images confirms the above findings. IMPRESSION: 1. Small filling defect is seen in lower lobe branch of right pulmonary artery consistent with small pulmonary embolus. Critical Value/emergent results were called by telephone at the time of interpretation on 07/13/2019 at 3:21 pm to [place] , who verbally acknowledged these results. 2. Small cardiomegaly. 3. Large bilateral airspace opacities are noted concerning for multifocal pneumonia. 4. Aortic atherosclerosis. Aortic Atherosclerosis (ICD10-I70.0). Electronically Signed   By: Marijo Conception M.D.   On: 07/13/2019 15:22   DG Chest Portable 1 View  Result Date: 07/13/2019 CLINICAL DATA:  Hypoxemia. Likely COVID. Additional history provided: Patient's wife COVID positive. Generalized weakness, productive cough with yellowish green phlegm and fever for 2 weeks. EXAM: PORTABLE CHEST 1 VIEW COMPARISON:  Chest radiograph 06/16/2019 FINDINGS: Mild cardiomegaly, unchanged. Prior median sternotomy. Aortic atherosclerosis. Interval development of bilateral ill-defined airspace opacities with a right upper lobe predominance on the right, and upper to mid lung  field predominance on the left. No evidence of pleural effusion or pneumothorax. No acute bony abnormality. Partially visualized cervical ACDF hardware. IMPRESSION: Interval development of bilateral airspace opacities consistent with multifocal pneumonia. Cardiomegaly, unchanged Electronically Signed   By: Marylyn Ishihara  Golden DO   On: 07/13/2019 11:13     PERTINENT LAB RESULTS: CBC: Recent Labs    07/22/19 0445 07/23/19 0141  WBC 10.3 10.4  HGB 12.9* 13.9  HCT 40.4 43.1  PLT 239 276   CMET CMP     Component Value Date/Time   NA 137 07/22/2019 0445   NA 134 04/22/2019 1010   K 4.4 07/22/2019 0445   CL 96 (L) 07/22/2019 0445   CO2 28 07/22/2019 0445   GLUCOSE 151 (H) 07/22/2019 0445   BUN 26 (H) 07/22/2019 0445   BUN 15 04/22/2019 1010   CREATININE 0.81 07/22/2019 0445   CREATININE 0.94 06/22/2016 0846   CALCIUM 8.8 (L) 07/22/2019 0445   PROT 6.0 (L) 07/22/2019 0445   PROT 6.2 04/22/2019 1010   ALBUMIN 2.8 (L) 07/22/2019 0445   ALBUMIN 4.4 04/22/2019 1010   AST 32 07/22/2019 0445   ALT 174 (H) 07/22/2019 0445   ALKPHOS 59 07/22/2019 0445   BILITOT 0.5 07/22/2019 0445   BILITOT 0.8 04/22/2019 1010   GFRNONAA >60 07/22/2019 0445   GFRAA >60 07/22/2019 0445    GFR Estimated Creatinine Clearance: 97.4 mL/min (by C-G formula based on SCr of 0.81 mg/dL). No results for input(s): LIPASE, AMYLASE in the last 72 hours. No results for input(s): CKTOTAL, CKMB, CKMBINDEX, TROPONINI in the last 72 hours. Invalid input(s): POCBNP No results for input(s): DDIMER in the last 72 hours. No results for input(s): HGBA1C in the last 72 hours. No results for input(s): CHOL, HDL, LDLCALC, TRIG, CHOLHDL, LDLDIRECT in the last 72 hours. No results for input(s): TSH, T4TOTAL, T3FREE, THYROIDAB in the last 72 hours.  Invalid input(s): FREET3 No results for input(s): VITAMINB12, FOLATE, FERRITIN, TIBC, IRON, RETICCTPCT in the last 72 hours. Coags: No results for input(s): INR in the last 72  hours.  Invalid input(s): PT Microbiology: Recent Results (from the past 240 hour(s))  Culture, blood (Routine X 2) w Reflex to ID Panel     Status: None   Collection Time: 07/13/19 11:40 AM   Specimen: BLOOD  Result Value Ref Range Status   Specimen Description BLOOD RIGHT ANTECUBITAL  Final   Special Requests   Final    BOTTLES DRAWN AEROBIC AND ANAEROBIC Blood Culture results may not be optimal due to an inadequate volume of blood received in culture bottles   Culture   Final    NO GROWTH 5 DAYS Performed at [facility], [place] [address], [place], [place] [address]    Report Status 07/18/2019 FINAL  Final  Culture, blood (Routine X 2) w Reflex to ID Panel     Status: None   Collection Time: 07/13/19 11:50 AM   Specimen: BLOOD  Result Value Ref Range Status   Specimen Description BLOOD LEFT ANTECUBITAL  Final   Special Requests   Final    BOTTLES DRAWN AEROBIC AND ANAEROBIC Blood Culture results may not be optimal due to an inadequate volume of blood received in culture bottles   Culture   Final    NO GROWTH 5 DAYS Performed at [facility], [place] [address], [place], [place] [address]    Report Status 07/18/2019 FINAL  Final  Respiratory Panel by RT PCR (Flu A&B, Covid) - Nasopharyngeal Swab     Status: Abnormal   Collection Time: 07/13/19 11:50 AM   Specimen: Nasopharyngeal Swab  Result Value Ref Range Status   SARS Coronavirus 2 by RT PCR POSITIVE (A) NEGATIVE Final    Comment:  RESULT CALLED TO, READ BACK BY AND VERIFIED WITH: MCKWON ON [id number] AT [id number] BY NFIELDS (NOTE) SARS-CoV-2 target nucleic acids are DETECTED. SARS-CoV-2 RNA is generally detectable in upper respiratory specimens  during the acute phase of infection. Positive results are indicative of the presence of the identified virus, but do not rule out bacterial infection or co-infection with other pathogens not detected by the test. Clinical correlation with patient history and other diagnostic  information is necessary to determine patient infection status. The expected result is Negative. Fact Sheet for Patients:  PinkCheek.be Fact Sheet for Healthcare Providers: GravelBags.it This test is not yet approved or cleared by the [place] FDA and  has been authorized for detection and/or diagnosis of SARS-CoV-2 by FDA under an Emergency Use Authorization (EUA).  This EUA will remain in effect (meaning this test can be used) f or the duration of  the COVID-19 declaration under Section 564(b)(1) of the Act, 21 U.S.C. section 360bbb-3(b)(1), unless the authorization is terminated or revoked sooner.    Influenza A by PCR NEGATIVE NEGATIVE Final   Influenza B by PCR NEGATIVE NEGATIVE Final    Comment: (NOTE) The Xpert Xpress SARS-CoV-2/FLU/RSV assay is intended as an aid in  the diagnosis of influenza from Nasopharyngeal swab specimens and  should not be used as a sole basis for treatment. Nasal washings and  aspirates are unacceptable for Xpert Xpress SARS-CoV-2/FLU/RSV  testing. Fact Sheet for Patients: PinkCheek.be Fact Sheet for Healthcare Providers: GravelBags.it This test is not yet approved or cleared by the [place] FDA and  has been authorized for detection and/or diagnosis of SARS-CoV-2 by  FDA under an Emergency Use Authorization (EUA). This EUA will remain  in effect (meaning this test can be used) for the duration of the  Covid-19 declaration under Section 564(b)(1) of the Act, 21  U.S.C. section 360bbb-3(b)(1), unless the authorization is  terminated or revoked. Performed at [facility], [place] [address], [place], [place] [address]     FURTHER DISCHARGE INSTRUCTIONS:  Get Medicines reviewed and adjusted: Please take all your medications with you for your next visit with your Primary MD  Laboratory/radiological data: Please request  your Primary MD to go over all hospital tests and procedure/radiological results at the follow up, please ask your Primary MD to get all Hospital records sent to his/her office.  In some cases, they will be blood work, cultures and biopsy results pending at the time of your discharge. Please request that your primary care M.D. goes through all the records of your hospital data and follows up on these results.  Also Note the following: If you experience worsening of your admission symptoms, develop shortness of breath, life threatening emergency, suicidal or homicidal thoughts you must seek medical attention immediately by calling 911 or calling your MD immediately  if symptoms less severe.  You must read complete instructions/literature along with all the possible adverse reactions/side effects for all the Medicines you take and that have been prescribed to you. Take any new Medicines after you have completely understood and accpet all the possible adverse reactions/side effects.   Do not drive when taking Pain medications or sleeping medications (Benzodaizepines)  Do not take more than prescribed Pain, Sleep and Anxiety Medications. It is not advisable to combine anxiety,sleep and pain medications without talking with your primary care practitioner  Special Instructions: If you have smoked or chewed Tobacco  in the last 2 yrs please stop smoking, stop any regular Alcohol  and or  any Recreational drug use.  Wear Seat belts while driving.  Please note: You were cared for by a hospitalist during your hospital stay. Once you are discharged, your primary care physician will handle any further medical issues. Please note that NO REFILLS for any discharge medications will be authorized once you are discharged, as it is imperative that you return to your primary care physician (or establish a relationship with a primary care physician if you do not have one) for your post hospital discharge needs so that  they can reassess your need for medications and monitor your lab values.  Total Time spent coordinating discharge including counseling, education and face to face time equals 35 minutes.  SignedOren Binet 07/23/2019 11:26 AM

## 2019-07-23 NOTE — Telephone Encounter (Signed)
Transition Care Management Follow-up Telephone Call  Date of discharge and from where: 07/23/2019, [facility]  How have you been since you were released from the hospital? Patient states that he is improving.  Any questions or concerns? No   Items Reviewed:  Did the pt receive and understand the discharge instructions provided? Yes   Medications obtained and verified? Yes   Any new allergies since your discharge? No   Dietary orders reviewed? Yes  Do you have support at home? Yes   Functional Questionnaire: (I = Independent and D = Dependent) ADLs: I  Bathing/Dressing- I  Meal Prep- I  Eating- I  Maintaining continence- I  Transferring/Ambulation- I  Managing Meds- I  Follow up appointments reviewed:   PCP Hospital f/u appt confirmed? Yes  Scheduled to see Dr. Damita Dunnings on 07/28/2019 @ 4 pm.  [facility] f/u appt confirmed? Yes  Scheduled to see cardiology.  Are transportation arrangements needed? No   If their condition worsens, is the pt aware to call PCP or go to the Emergency Dept.? Yes  Was the patient provided with contact information for the PCP's office or ED? Yes  Was to pt encouraged to call back with questions or concerns? Yes

## 2019-07-23 NOTE — Telephone Encounter (Signed)
Patient's wife notified as instructed by telephone and verbalized understanding. Patient's wife stated that they checked his temperature today and it is normal.. Mrs. Perteet stated that she will continue to monitor her husband and if he develops any symptoms she will let Dr. Damita Dunnings know.

## 2019-07-23 NOTE — Telephone Encounter (Signed)
Based on his timeline from diagnosis he should be okay to come in for an in person visit on the 18th assuming he is fever free and improving overall.  Please verify with patient/wife.  They are reliable.  Please give them the routine cautions that would preclude an in person visit.  Assuming he has none of those over the weekend prior to the 18th, then I think it makes sense to come in for a visit at the office.  Thanks.

## 2019-07-24 ENCOUNTER — Telehealth: Payer: Self-pay | Admitting: Cardiovascular Disease

## 2019-07-24 NOTE — Telephone Encounter (Signed)
Returned call to patient who states he was discharged from the hospital yesterday. He states it was a very difficult admission and he just wants to let all of his doctors know. He has an appointment with PCP on Monday. I scheduled him to see Dr. Acie Fredrickson in March and advised him to follow hospital d/c instructions or Dr. Josefine Class advice regarding medication adjustments until that time. Patient and wife thanked me for the call.

## 2019-07-24 NOTE — Telephone Encounter (Signed)
New Message:     Wife said pt have been in [facility] for 10 days with COVID and Pneumonia. His doctor told him to contact Dr Acie Fredrickson and make him aware of this.

## 2019-07-28 ENCOUNTER — Other Ambulatory Visit: Payer: Medicare Other

## 2019-07-28 ENCOUNTER — Ambulatory Visit
Admission: RE | Admit: 2019-07-28 | Discharge: 2019-07-28 | Disposition: A | Discharge status: Home / Self Care | Payer: Medicare HMO | Source: Ambulatory Visit | Attending: Family Medicine | Admitting: Family Medicine

## 2019-07-28 ENCOUNTER — Ambulatory Visit (INDEPENDENT_AMBULATORY_CARE_PROVIDER_SITE_OTHER): Payer: Medicare HMO | Admitting: Family Medicine

## 2019-07-28 ENCOUNTER — Encounter: Payer: Self-pay | Admitting: Family Medicine

## 2019-07-28 ENCOUNTER — Other Ambulatory Visit: Payer: Self-pay

## 2019-07-28 VITALS — BP 104/56 | HR 74 | Temp 97.2°F | Ht 72.0 in | Wt 202.8 lb

## 2019-07-28 DIAGNOSIS — Z8616 Personal history of COVID-19: Secondary | ICD-10-CM

## 2019-07-28 DIAGNOSIS — J069 Acute upper respiratory infection, unspecified: Secondary | ICD-10-CM | POA: Diagnosis not present

## 2019-07-28 NOTE — Patient Instructions (Addendum)
Go to the lab on the way out.   If you have mychart we'll likely use that to update you.    Continue O2 as is for now.  Recheck CXR in about 1 month.  Please update me about your situation next week, sooner if needed.  I would try using albuterol as needed in the meantime.   Keep doing incentive spirometry.  Take care.  Glad to see you.

## 2019-07-28 NOTE — Progress Notes (Addendum)
This visit occurred during the SARS-CoV-2 public health emergency.  Safety protocols were in place, including screening questions prior to the visit, additional usage of staff PPE, and extensive cleaning of exam room while observing appropriate contact time as indicated for disinfecting solutions.  ================== Admit Date: 07/13/2019 Discharge date: 07/23/2019  Recommendations for Outpatient Follow-up:  1. Follow up with PCP in 1-2 weeks 2. Please obtain CMP/CBC in one week 3. Repeat Chest Xray in 4-6 week 4. Please reassess if patient requires home O2 at next visit with PCP.  Admitted From:  Home  Disposition: Home with home health services.   Home Health: Yes  Equipment/Devices: Oxygen-2 L at rest and 3 L with ambulation (per physical therapy note today)  Discharge Condition: Stable  CODE STATUS: FULL CODE  Diet recommendation:     Diet Order                  Diet - low sodium heart healthy         Diet regular Room service appropriate? 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  Patient's hospital course was long-with slow improvement-by day of discharge was down to 2 L at rest and requiring approximately 3-4 L of oxygen with ambulation.  Discharged home with home O2-see below for further details.  Brief Hospital Course: Acute Hypoxic Resp Failure due to Covid 19 Viral pneumonia, concurrent bacterial pneumonia and pulmonary embolism: Had severe hypoxemia on initial presentation-treated with steroids, remdesivir and 1 dose of convalescent plasma.  Also was on Rocephin and Zithromax.  He has gradually but slowly improved-he is now down to just 2 L at rest-Per physical therapy  note-required just 3 L of oxygen to maintain O2 saturations this morning.  Stable for discharge with home health services today.  Has finished a course of steroids, remdesivir.  Patient will need to follow-up with primary care practitioner in a few weeks to see if he still requires home O2.  COVID-19 Labs:  Recent Labs (last 2 labs)       Recent Labs    07/21/19 0505 07/22/19 0445  CRP 1.7* 0.9      Recent Labs       Lab Results  Component Value Date   SARSCOV2NAA POSITIVE (A) 07/13/2019       COVID-19 Medications: Steroids:1/3>> Remdesivir:1/3>>1/7 Convalescent Plasma:1/4 x 1  Other medications: Diuretics: Euvolemic-no need for Lasix today. Antibiotics: Rocephin: 1/3>>1/8 Zithromax:1/3>>1/6   Acute [id number] branch and right lower lobe.On Xarelto at home, he has not been very compliant-likely PE provoked from COVID 19 and non compliance to xarelto. Suspect that this is not Xarelto failure.Managed with IV heparin-has been transitioned to Xarelto on 1/10.  HTN: controlled-continue lopressor  Transaminitis:2/2 covid-continues to have elevation in ALT-but is downtrending-now off remdesivir-follow closely in the outpatient setting.  HLD:-hold Lipitor given elevated LFT's  H/o atrial myxoma/PAF:-hx of myxoma removed in 2014 and brief period of post-op afib that resolved after electrical cardioversion later that year. On anticoagulation-see above  Hemorrhoidal bleed: Continues intermittently-continue Anusol suppository. Hemoglobin stable.  Nutrition Problem: Nutrition Problem: Increased nutrient needs Etiology: acute illness(COVID-19) Signs/Symptoms: estimated needs Interventions: Ensure Enlive (each supplement provides 350kcal and 20  grams of protein), Magic cup  Obesity: Estimated body mass index is 30.5 kg/m as calculated from the following: Height as of this encounter: 6' (1.829 m). Weight as of this encounter: 102  kg.  Procedures/Studies: None  Discharge Diagnoses:  Principal Problem:   Acute respiratory disease due to COVID-19 virus Active Problems:   Pure hypercholesterolemia   HTN (hypertension)   Myxoma of heart   PAF (paroxysmal atrial fibrillation) (HCC)   Current use of long term anticoagulation   Pulmonary embolism (HCC)  ==================  Follow-up after hospitalization for Covid.  Inpatient course discussed with patient.  Now back home.  Still anticoagulated.  Done with antibiotics.  Not on steroids currently.  On O2 at 2 L per nasal cannula at baseline now.  This is a new start since he became ill.  Recheck pulse ox 92% on 2L.  No SABA use.  No tessalon use.  He is going to change to 20mg  xarelto in the future, but currently on 15mg  BID.  He is still doing IS at home.  He is doing well with that.  Cough with yellow sputum last night after doing IS. Took tylenol for backache after coughing spell.  Slept until 9:15 this AM and that was a good night sleep.  "I cleared a lot of this stuff out.  My lungs feel better."  Not coughing now.    He is walking around his house on a 37" O2 cord.  Discussed.  He has physical therapy pending at home.  Meds, vitals, and allergies reviewed.   ROS: Per HPI unless specifically indicated in ROS section   nad ncat On O2 via nasal cannula. Neck supple.  No lymphadenopathy. rrr ctab Normal cap refill.    Abdomen soft.  Normal bowel sounds. Extremities without edema.  At least 30 minutes were devoted to patient care in this encounter (this can potentially include time spent reviewing the patient's file/history, interviewing and examining the patient, counseling/reviewing plan with patient, ordering referrals, ordering tests, reviewing relevant laboratory or x-ray data, and documenting the encounter).

## 2019-07-29 LAB — CBC WITH DIFFERENTIAL/PLATELET
Basophils Absolute: 0 10*3/uL (ref 0.0–0.1)
Basophils Relative: 0.5 % (ref 0.0–3.0)
Eosinophils Absolute: 0.2 10*3/uL (ref 0.0–0.7)
Eosinophils Relative: 2.7 % (ref 0.0–5.0)
HCT: 38.8 % — ABNORMAL LOW (ref 39.0–52.0)
Hemoglobin: 13 g/dL (ref 13.0–17.0)
Lymphocytes Relative: 12.5 % (ref 12.0–46.0)
Lymphs Abs: 1 10*3/uL (ref 0.7–4.0)
MCHC: 33.6 g/dL (ref 30.0–36.0)
MCV: 91.5 fl (ref 78.0–100.0)
Monocytes Absolute: 0.6 10*3/uL (ref 0.1–1.0)
Monocytes Relative: 7.9 % (ref 3.0–12.0)
Neutro Abs: 6.3 10*3/uL (ref 1.4–7.7)
Neutrophils Relative %: 76.4 % (ref 43.0–77.0)
Platelets: 207 10*3/uL (ref 150.0–400.0)
RBC: 4.24 Mil/uL (ref 4.22–5.81)
RDW: 15.7 % — ABNORMAL HIGH (ref 11.5–15.5)
WBC: 8.3 10*3/uL (ref 4.0–10.5)

## 2019-07-29 LAB — COMPREHENSIVE METABOLIC PANEL
ALT: 65 U/L — ABNORMAL HIGH (ref 0–53)
AST: 32 U/L (ref 0–37)
Albumin: 3.3 g/dL — ABNORMAL LOW (ref 3.5–5.2)
Alkaline Phosphatase: 62 U/L (ref 39–117)
BUN: 17 mg/dL (ref 6–23)
CO2: 31 mEq/L (ref 19–32)
Calcium: 8.6 mg/dL (ref 8.4–10.5)
Chloride: 96 mEq/L (ref 96–112)
Creatinine, Ser: 0.84 mg/dL (ref 0.40–1.50)
GFR: 89.05 mL/min (ref >60.00)
Glucose, Bld: 102 mg/dL — ABNORMAL HIGH (ref 70–99)
Potassium: 4 mEq/L (ref 3.5–5.1)
Sodium: 134 mEq/L — ABNORMAL LOW (ref 135–145)
Total Bilirubin: 0.7 mg/dL (ref 0.2–1.2)
Total Protein: 6.4 g/dL (ref 6.0–8.3)

## 2019-07-30 ENCOUNTER — Encounter: Payer: Self-pay | Admitting: Family Medicine

## 2019-07-31 ENCOUNTER — Other Ambulatory Visit: Payer: Self-pay | Admitting: Family Medicine

## 2019-07-31 DIAGNOSIS — J069 Acute upper respiratory infection, unspecified: Secondary | ICD-10-CM

## 2019-07-31 DIAGNOSIS — U071 COVID-19: Secondary | ICD-10-CM

## 2019-07-31 NOTE — Assessment & Plan Note (Signed)
Clearly improved.  Check routine follow-up labs today Continue O2 as is for now.  Recheck CXR in about 1 month.  Rationale discussed with patient.  He agrees. I want him to update me about his situation next week, sooner if needed.  I would try using albuterol as needed in the meantime.   Advised to keep doing incentive spirometry.  He agrees with plan.

## 2019-08-04 ENCOUNTER — Encounter: Payer: Medicare Other | Admitting: Family Medicine

## 2019-08-06 ENCOUNTER — Other Ambulatory Visit: Payer: Self-pay | Admitting: Cardiovascular Disease

## 2019-08-06 MED ORDER — HYDROCHLOROTHIAZIDE 25 MG PO TABS: 25.0000 mg | ORAL_TABLET | Freq: Every day | ORAL | 2 refills | Status: DC

## 2019-08-06 MED ORDER — METOPROLOL TARTRATE 25 MG PO TABS: ORAL_TABLET | ORAL | 2 refills | Status: DC

## 2019-08-06 MED ORDER — RIVAROXABAN 20 MG PO TABS: 20.0000 mg | ORAL_TABLET | Freq: Every day | ORAL | 6 refills | Status: DC

## 2019-08-06 MED ORDER — POTASSIUM CHLORIDE CRYS ER 20 MEQ PO TBCR: 20.0000 meq | EXTENDED_RELEASE_TABLET | Freq: Two times a day (BID) | ORAL | 2 refills | Status: DC

## 2019-08-06 NOTE — Telephone Encounter (Signed)
Pt last saw Dr Acie Fredrickson 04/22/19, last labs 07/28/19 Creat 0.84, age 74, weight 92kg, CrCl 98.88, based on CrCl pt is on appropriate dosage of Xarelto 20mg  QD.  Will refill rx.

## 2019-08-08 ENCOUNTER — Other Ambulatory Visit: Payer: Self-pay

## 2019-08-08 MED ORDER — POTASSIUM CHLORIDE CRYS ER 20 MEQ PO TBCR: 20.0000 meq | EXTENDED_RELEASE_TABLET | Freq: Two times a day (BID) | ORAL | 2 refills | Status: DC

## 2019-08-08 MED ORDER — METOPROLOL TARTRATE 25 MG PO TABS: ORAL_TABLET | ORAL | 2 refills | Status: DC

## 2019-08-08 MED ORDER — HYDROCHLOROTHIAZIDE 25 MG PO TABS: 25.0000 mg | ORAL_TABLET | Freq: Every day | ORAL | 2 refills | Status: DC

## 2019-08-11 ENCOUNTER — Other Ambulatory Visit: Payer: Self-pay

## 2019-08-11 MED ORDER — RIVAROXABAN 20 MG PO TABS: 20.0000 mg | ORAL_TABLET | Freq: Every day | ORAL | 1 refills | Status: DC

## 2019-08-11 NOTE — Telephone Encounter (Signed)
Pt last saw Dr Acie Fredrickson 04/22/19, last labs 07/28/19 Creat 0.84, age 74, weight 92kg, CrCl 98.88, based on CrCl pt is on appropriate dosage of Xarelto 20mg  QD.  Will refill rx.

## 2019-08-12 ENCOUNTER — Ambulatory Visit: Payer: Medicare HMO | Admitting: Family Medicine

## 2019-08-12 ENCOUNTER — Encounter: Payer: Self-pay | Admitting: Physician Assistant

## 2019-08-12 ENCOUNTER — Other Ambulatory Visit: Payer: Self-pay

## 2019-08-12 VITALS — BP 140/42 | HR 82 | Ht 73.0 in | Wt 209.0 lb

## 2019-08-12 DIAGNOSIS — I2699 Other pulmonary embolism without acute cor pulmonale: Secondary | ICD-10-CM

## 2019-08-12 DIAGNOSIS — I480 Paroxysmal atrial fibrillation: Secondary | ICD-10-CM | POA: Diagnosis not present

## 2019-08-12 DIAGNOSIS — D151 Benign neoplasm of heart: Secondary | ICD-10-CM | POA: Diagnosis not present

## 2019-08-12 DIAGNOSIS — I10 Essential (primary) hypertension: Secondary | ICD-10-CM | POA: Diagnosis not present

## 2019-08-12 DIAGNOSIS — E782 Mixed hyperlipidemia: Secondary | ICD-10-CM | POA: Diagnosis not present

## 2019-08-12 NOTE — Progress Notes (Signed)
Cardiology Office Note  Date: 08/12/2019   ID: Matthew Sullivan 31-Mar-1946, MRN [id number]  PCP:  Tonia Ghent, MD  Cardiologist:  Mertie Moores, MD Electrophysiologist:  None   Chief Complaint  Patient presents with  . Follow-up    Hospital follow-up status post COVID-19  pneumonia with acute hypoxic respiratory failure/PE    DecreasingHistory of Present Illness: Matthew Sullivan is a 74 y.o. male last encounter with Dr. Acie Fredrickson April 23, 2019.  History of left atrial myxoma with excision by Dr. Roxan Hockey in 2014.  No CAD by catheterization.  History of postop atrial fibrillation.  Subsequent DC cardioversion in 2014 with conversion to NSR.  His amiodarone was tapered and discontinued.  At last visit he denied any awareness of recurrent atrial fibrillation.  He was working full-time and had recently undergone left ankle surgery.    Additional PMH include; hyperlipidemia, hypertension, melanoma, TIA, cervical osteoarthritis. Previous history of melanoma status post Mohs micrographic surgery on May 12, 2019.  Patient had a recent hospitalization on July 13, 2019 with diagnosis of acute hypoxic respiratory failure secondary to Covid pneumonia and PE.  CT scan showed a small branch of the right lower lobe with PE.  This was thought secondary to Covid pneumonia and noncompliance with Xarelto therapy.  Patient also had transaminitis thought possibly due to Lipitor therapy.  Lipitor was held during hospitalization.  Patient had prolonged hospital stay of 10 days.  At discharge he was to have a CMP and CBC in 1 week with a repeat chest x-ray in 4 to 6 weeks.  Patient was discharged to home with O2 therapy.  Patient was to follow-up with primary care provider post discharge to be assessed for need to continue home O2. . Current cardiac medications include;Marland Kitchen  Metoprolol tartrate 25 mg 1/2 tablet by mouth 2 times daily.  Xarelto 20 mg p.o. daily, hydrochlorothiazide 25 mg daily.   Potassium chloride 20 mEq p.o. twice daily.  Patient states he has been doing well from a cardiac standpoint.  States he lost approximately 20 pounds since entering the hospital.  He had a prolonged hospital stay of 10 days.  He is followed up with Dr. Damita Dunnings and had post hospital CBC and complete metabolic panel with no major issues.  He has a pending follow-up chest x-ray on February 12.  Status post hospital discharge.  He denies any significant anginal or exertional symptoms, palpitations or arrhythmias.  EKG today sinus rhythm with frequent PACs rate of 82.  His Lipitor was stopped during hospital stay for elevated AST and ALT.   Past Medical History:  Diagnosis Date  . Atrial fibrillation, currently in sinus rhythm   . Atrial myxoma   . Cervical osteoarthritis   . History of TIA (transient ischemic attack)   . History of tinnitus   . Hyperlipidemia   . Hypertension   . Melanoma ([place])    per Dr. Allyson Sabal, local excision, no chemo/no rady tx.     Past Surgical History:  Procedure Laterality Date  . ANKLE ARTHROSCOPY WITH ARTHRODESIS Left 08/26/2015   Procedure: LEFT ANKLE ARTHROSCOPY TIBIOTALAR ARTHRODESIS;  Surgeon: [name], MD;  Location: [facility];  Service: Orthopedics;  Laterality: Left;  . APPENDECTOMY    . BACK SURGERY    . CARDIOVERSION N/A 06/25/2013   Procedure: CARDIOVERSION;  Surgeon: [name], MD;  Location: [facility];  Service: Cardiovascular;  Laterality: N/A;  . EXCISION OF ATRIAL MYXOMA Left 04/09/2013  Procedure: EXCISION OF ATRIAL MYXOMA;  Surgeon: [name], MD;  Location: [facility];  Service: Open Heart Surgery;  Laterality: Left;  . INTRAOPERATIVE TRANSESOPHAGEAL ECHOCARDIOGRAM N/A 04/09/2013   Procedure: INTRAOPERATIVE TRANSESOPHAGEAL ECHOCARDIOGRAM;  Surgeon: [name], MD;  Location: [facility];  Service: Open Heart Surgery;  Laterality: N/A;  . LEFT HEART CATHETERIZATION WITH CORONARY ANGIOGRAM N/A 04/07/2013    Procedure: LEFT HEART CATHETERIZATION WITH CORONARY ANGIOGRAM;  Surgeon: [name], MD;  Location[facility];  Service: Cardiovascular;  Laterality: N/A;  . NASAL SEPTUM SURGERY    . NOSE SURGERY    . ROTATOR CUFF REPAIR     both shoulders    Current Outpatient Medications  Medication Sig Dispense Refill  . albuterol (VENTOLIN HFA) 108 (90 Base) MCG/ACT inhaler Inhale 2 puffs into the lungs every 2 (two) hours as needed for wheezing or shortness of breath. 6.7 g 0  . benzonatate (TESSALON PERLES) 100 MG capsule Take 1 capsule (100 mg total) by mouth 3 (three) times daily as needed for cough. 30 capsule 0  . feeding supplement, ENSURE ENLIVE, (ENSURE ENLIVE) LIQD Take 237 mLs by mouth 3 (three) times daily between meals. 21330 mL 0  . hydrochlorothiazide (HYDRODIURIL) 25 MG tablet Take 1 tablet (25 mg total) by mouth daily. 90 tablet 2  . metoprolol tartrate (LOPRESSOR) 25 MG tablet TAKE 1/2 TABLET BY MOUTH 2 TIMES DAILY. 90 tablet 2  . potassium chloride SA (KLOR-CON) 20 MEQ tablet Take 1 tablet (20 mEq total) by mouth 2 (two) times daily. 180 tablet 2  . rivaroxaban (XARELTO) 20 MG TABS tablet Take 1 tablet (20 mg total) by mouth daily with supper. 90 tablet 1   No current facility-administered medications for this visit.   Allergies:  Patient has no known allergies.   Social History: The patient  reports that he quit smoking about 42 years ago. He has never used smokeless tobacco. He reports current alcohol use of about 2.0 standard drinks of alcohol per week. He reports that he does not use drugs.   Family History: The patient's family history includes Brain cancer in his father; Cancer in his father; Colon cancer in his mother; Liver cancer in his mother.   ROS:  Please see the history of present illness. Otherwise, complete review of systems is positive for none.  All other systems are reviewed and negative.   Physical Exam: VS:  BP (!) 140/42   Pulse 82   Ht 6\' 1"  (1.854  m)   Wt 209 lb (94.8 kg)   SpO2 90%   BMI 27.57 kg/m , BMI Body mass index is 27.57 kg/m.  Wt Readings from Last 3 Encounters:  08/12/19 209 lb (94.8 kg)  07/28/19 202 lb 12.8 oz (92 kg)  07/13/19 224 lb 13.9 oz (102 kg)    General: Patient appears comfortable at rest. HEENT: Conjunctiva and lids normal, oropharynx clear with moist mucosa. Neck: Supple, no elevated JVP or carotid bruits, no thyromegaly. Lungs: Clear to auscultation, nonlabored breathing at rest. Cardiac: Regular rate and rhythm, no S3 or significant systolic murmur, no pericardial rub. Abdomen: Soft, nontender, no hepatomegaly, bowel sounds present, no guarding or rebound. Extremities: No pitting edema, distal pulses 2+. Skin: Warm and dry. Musculoskeletal: No kyphosis. Neuropsychiatric: Alert and oriented x3, affect grossly appropriate.  ECG:  An ECG dated August 12, 2019 was personally reviewed today and demonstrated:  Sinus rhythm with PACs rate of 82  Recent Labwork: 07/13/2019: B Natriuretic Peptide 566.7 07/18/2019:  Magnesium 2.1 07/28/2019: ALT 65; AST 32; BUN 17; Creatinine, Ser 0.84; Hemoglobin 13.0; Platelets 207.0; Potassium 4.0; Sodium 134     Component Value Date/Time   CHOL 139 04/22/2019 1010   TRIG 124 04/22/2019 1010   HDL 44 04/22/2019 1010   CHOLHDL 3.2 04/22/2019 1010   CHOLHDL 2.9 06/22/2016 0846   VLDL 14 06/22/2016 0846   LDLCALC 73 04/22/2019 1010    Other Studies Reviewed Today:  Echocardiogram July 23, 2014 Study Conclusions   - Left ventricle: The cavity size was normal. Systolic function was  normal. The estimated ejection fraction was in the range of 50%  to 55%. Wall motion was normal; there were no regional wall  motion abnormalities. Left ventricular diastolic function  parameters were normal.  - Left atrium: The atrium was mildly dilated.  - Atrial septum: No defect or patent foramen ovale was identified.  - Pulmonary arteries: PA peak pressure: 35 mm Hg (S).     CT angiogram of chest 07/13/2019 IMPRESSION: 1. Small filling defect is seen in lower lobe branch of right pulmonary artery consistent with small pulmonary embolus. Critical Value/emergent results were called by telephone at the time of interpretation on 07/13/2019 at 3:21 pm to [place] , who verbally acknowledged these results. 2. Small cardiomegaly. 3. Large bilateral airspace opacities are noted concerning for multifocal pneumonia. 4. Aortic atherosclerosis. Aortic Atherosclerosis (ICD10-I70.0).   Assessment and Plan:  1. Essential hypertension   2. PAF (paroxysmal atrial fibrillation) ([place])   3. Myxoma of heart   4. Mixed hyperlipidemia   5. Other acute pulmonary embolism without acute cor pulmonale ([place])    1. Essential hypertension Blood pressure slightly elevated today on arrival 140/42.  Recheck in left arm 122/58.  Continue HCTZ 25 mg daily.  2. PAF (paroxysmal atrial fibrillation) ([facility]) EKG today shows sinus rhythm with frequent PACs rate of 82.  Patient denies any significant palpitations or arrhythmias.  Continue metoprolol 25 mg p.o. twice daily.  Continue Xarelto 20 mg daily.  3. Myxoma of heart Previous history of excision of atrial myxoma in 2014 by Dr. Roxan Hockey.  No further issues.  4. Mixed hyperlipidemia Lipitor was recently stopped during hospital stay due to transaminitis.  Restart Lipitor 80 mg 1/2 tablet daily. Recent AST and ALT on 07/28/2019. 32 and 65 respectively.   5. Other acute pulmonary embolism without acute cor pulmonale ([facility]) Recent right lower lobe pulmonary embolism status post Covid pneumonia and possible noncompliance with Xarelto therapy.  Patient was started back on Xarelto 20 mg daily prior to discharge from the hospital stay recently.  Medication Adjustments/Labs and Tests Ordered: Current medicines are reviewed at length with the patient today.  Concerns regarding medicines are outlined above.    Patient  Instructions  Medication Instructions:   Your physician recommends that you continue on your current medications as directed. Please refer to the Current Medication list given to you today.  *If you need a refill on your cardiac medications before your next appointment, please call your pharmacy*  Lab Work:  None ordered today  If you have labs (blood work) drawn today and your tests are completely normal, you will receive your results only by: Marland Kitchen MyChart Message (if you have MyChart) OR . A paper copy in the mail If you have any lab test that is abnormal or we need to change your treatment, we will call you to review the results.  Testing/Procedures:  None ordered today  Follow-Up: At [facility], you and your  health needs are our priority.  As part of our continuing mission to provide you with exceptional heart care, we have created designated Provider Care Teams.  These Care Teams include your primary Cardiologist (physician) and Advanced Practice Providers (APPs -  Physician Assistants and Nurse Practitioners) who all work together to provide you with the care you need, when you need it.  Your next appointment:    Keep your follow up with Dr. Acie Fredrickson on 09/18/19 at 2:40PM        Signed, Levell July, NP 08/12/2019 4:28 PM    [facility] [place], [place], [place] [address] Phone: [phone]; Fax: [phone]

## 2019-08-12 NOTE — Patient Instructions (Addendum)
Medication Instructions:   Your physician recommends that you continue on your current medications as directed. Please refer to the Current Medication list given to you today.  *If you need a refill on your cardiac medications before your next appointment, please call your pharmacy*  Lab Work:  None ordered today  If you have labs (blood work) drawn today and your tests are completely normal, you will receive your results only by: Marland Kitchen MyChart Message (if you have MyChart) OR . A paper copy in the mail If you have any lab test that is abnormal or we need to change your treatment, we will call you to review the results.  Testing/Procedures:  None ordered today  Follow-Up: At [facility], you and your health needs are our priority.  As part of our continuing mission to provide you with exceptional heart care, we have created designated Provider Care Teams.  These Care Teams include your primary Cardiologist (physician) and Advanced Practice Providers (APPs -  Physician Assistants and Nurse Practitioners) who all work together to provide you with the care you need, when you need it.  Your next appointment:    Keep your follow up with Dr. Acie Fredrickson on 09/18/19 at 2:40PM

## 2019-08-15 ENCOUNTER — Other Ambulatory Visit: Payer: Self-pay

## 2019-08-15 MED ORDER — POTASSIUM CHLORIDE CRYS ER 20 MEQ PO TBCR: 20.0000 meq | EXTENDED_RELEASE_TABLET | Freq: Two times a day (BID) | ORAL | 3 refills | Status: DC

## 2019-08-15 MED ORDER — HYDROCHLOROTHIAZIDE 25 MG PO TABS: 25.0000 mg | ORAL_TABLET | Freq: Every day | ORAL | 3 refills | Status: DC

## 2019-08-15 MED ORDER — METOPROLOL TARTRATE 25 MG PO TABS: ORAL_TABLET | ORAL | 3 refills | Status: DC

## 2019-08-19 ENCOUNTER — Ambulatory Visit (INDEPENDENT_AMBULATORY_CARE_PROVIDER_SITE_OTHER): Payer: Medicare HMO | Admitting: Family Medicine

## 2019-08-19 ENCOUNTER — Ambulatory Visit (INDEPENDENT_AMBULATORY_CARE_PROVIDER_SITE_OTHER)
Admission: RE | Admit: 2019-08-19 | Discharge: 2019-08-19 | Disposition: A | Discharge status: Home / Self Care | Payer: Medicare HMO | Source: Ambulatory Visit | Attending: Family Medicine | Admitting: Family Medicine

## 2019-08-19 ENCOUNTER — Other Ambulatory Visit: Payer: Self-pay

## 2019-08-19 ENCOUNTER — Encounter: Payer: Self-pay | Admitting: Family Medicine

## 2019-08-19 VITALS — BP 124/60 | HR 75 | Temp 96.6°F | Ht 73.0 in | Wt 209.2 lb

## 2019-08-19 DIAGNOSIS — J189 Pneumonia, unspecified organism: Secondary | ICD-10-CM | POA: Diagnosis not present

## 2019-08-19 DIAGNOSIS — J069 Acute upper respiratory infection, unspecified: Secondary | ICD-10-CM

## 2019-08-19 DIAGNOSIS — U071 COVID-19: Secondary | ICD-10-CM

## 2019-08-19 LAB — COMPREHENSIVE METABOLIC PANEL
ALT: 27 U/L (ref 0–53)
AST: 18 U/L (ref 0–37)
Albumin: 4 g/dL (ref 3.5–5.2)
Alkaline Phosphatase: 67 U/L (ref 39–117)
BUN: 12 mg/dL (ref 6–23)
CO2: 32 mEq/L (ref 19–32)
Calcium: 9.4 mg/dL (ref 8.4–10.5)
Chloride: 102 mEq/L (ref 96–112)
Creatinine, Ser: 0.83 mg/dL (ref 0.40–1.50)
GFR: 90.28 mL/min (ref >60.00)
Glucose, Bld: 79 mg/dL (ref 70–99)
Potassium: 3.9 mEq/L (ref 3.5–5.1)
Sodium: 138 mEq/L (ref 135–145)
Total Bilirubin: 0.6 mg/dL (ref 0.2–1.2)
Total Protein: 6.9 g/dL (ref 6.0–8.3)

## 2019-08-19 NOTE — Progress Notes (Signed)
This visit occurred during the SARS-CoV-2 public health emergency.  Safety protocols were in place, including screening questions prior to the visit, additional usage of staff PPE, and extensive cleaning of exam room while observing appropriate contact time as indicated for disinfecting solutions.  covid follow up.  CXR done at [place] today.  He clearly feels better.  Has been off O2 for about 1 week.  He checked his pulse ox at home and it was still normal at rest, lower with exertion.  He still gets winded with stairs with quick recovery.  No fevers, no chills.  Less cough than prior, still with some sputum occ.  Sputum is still light greenish.    His wife needs surgery, d/w pt.    He is still off lipitor.  F/u CMET pending.  We can address statin after I see his labs, d/w pt. No jaundice.  No abd pain.  No blood in stool.    Meds, vitals, and allergies reviewed.   ROS: Per HPI unless specifically indicated in ROS section   GEN: nad, alert and oriented HEENT: ncat NECK: supple w/o LA CV: rrr. PULM: ctab, no inc wob, no wheeze. ABD: soft, +bs EXT: no edema SKIN: no acute rash Not on supplemental O2.

## 2019-08-19 NOTE — Patient Instructions (Signed)
Go to the lab on the way out.   If you have mychart we'll likely use that to update you.    Update me as needed.   Take care.  Glad to see you. Gradually return to more exertion as tolerated.

## 2019-08-20 ENCOUNTER — Other Ambulatory Visit: Payer: Self-pay | Admitting: Family Medicine

## 2019-08-20 DIAGNOSIS — E7800 Pure hypercholesterolemia, unspecified: Secondary | ICD-10-CM

## 2019-08-20 MED ORDER — ATORVASTATIN CALCIUM 80 MG PO TABS: 40.0000 mg | ORAL_TABLET | Freq: Every day | ORAL | Status: DC

## 2019-08-20 NOTE — Assessment & Plan Note (Signed)
Clearly better in the meantime.  X-ray reviewed with patient.  See notes on imaging.  Off supplemental O2.  Okay for outpatient follow-up.  He is gradually making progress.  At this point still okay for outpatient follow-up.  Discussed with patient.  See notes on labs.  Previous LFT elevation noted. At least 30 minutes were devoted to patient care in this encounter (this can potentially include time spent reviewing the patient's file/history, interviewing and examining the patient, counseling/reviewing plan with patient, ordering referrals, ordering tests, reviewing relevant laboratory or x-ray data, and documenting the encounter).

## 2019-08-21 ENCOUNTER — Ambulatory Visit: Payer: Medicare HMO | Admitting: Family Medicine

## 2019-08-21 DIAGNOSIS — L905 Scar conditions and fibrosis of skin: Secondary | ICD-10-CM | POA: Diagnosis not present

## 2019-08-21 DIAGNOSIS — L821 Other seborrheic keratosis: Secondary | ICD-10-CM | POA: Diagnosis not present

## 2019-08-21 DIAGNOSIS — L814 Other melanin hyperpigmentation: Secondary | ICD-10-CM | POA: Diagnosis not present

## 2019-08-21 DIAGNOSIS — Z85820 Personal history of malignant melanoma of skin: Secondary | ICD-10-CM | POA: Diagnosis not present

## 2019-08-21 DIAGNOSIS — L570 Actinic keratosis: Secondary | ICD-10-CM | POA: Diagnosis not present

## 2019-08-21 DIAGNOSIS — Z85828 Personal history of other malignant neoplasm of skin: Secondary | ICD-10-CM | POA: Diagnosis not present

## 2019-08-21 DIAGNOSIS — D229 Melanocytic nevi, unspecified: Secondary | ICD-10-CM | POA: Diagnosis not present

## 2019-08-22 ENCOUNTER — Ambulatory Visit: Payer: Medicare HMO | Admitting: Family Medicine

## 2019-08-23 DIAGNOSIS — U071 COVID-19: Secondary | ICD-10-CM | POA: Diagnosis not present

## 2019-08-26 ENCOUNTER — Telehealth: Payer: Self-pay

## 2019-08-26 NOTE — Telephone Encounter (Signed)
I received a call from patient's wife. She states [employer] came to pick up patient's oxygen today, and they are needing a letter of discontinuation of oxygen from Dr. Damita Dunnings.

## 2019-08-27 NOTE — Telephone Encounter (Signed)
Letter done.  It should be available via MyChart.  If they need it printed off, then please do so and put it upfront for pickup.  Thanks.

## 2019-08-29 NOTE — Telephone Encounter (Signed)
I contacted patient to let him know letter has been completed. They would like this faxed to [place]. They provided [employer]'s fax number which is [phone]. Letter has been printed and faxed to [place]. Thanks!

## 2019-09-18 ENCOUNTER — Ambulatory Visit: Payer: Medicare HMO | Admitting: Cardiovascular Disease

## 2019-09-18 ENCOUNTER — Encounter: Payer: Self-pay | Admitting: Cardiovascular Disease

## 2019-09-18 ENCOUNTER — Other Ambulatory Visit: Payer: Self-pay

## 2019-09-18 VITALS — BP 134/74 | HR 72 | Ht 72.0 in | Wt 213.4 lb

## 2019-09-18 DIAGNOSIS — I480 Paroxysmal atrial fibrillation: Secondary | ICD-10-CM | POA: Diagnosis not present

## 2019-09-18 DIAGNOSIS — I10 Essential (primary) hypertension: Secondary | ICD-10-CM | POA: Diagnosis not present

## 2019-09-18 DIAGNOSIS — E782 Mixed hyperlipidemia: Secondary | ICD-10-CM

## 2019-09-18 DIAGNOSIS — E785 Hyperlipidemia, unspecified: Secondary | ICD-10-CM | POA: Insufficient documentation

## 2019-09-18 NOTE — Patient Instructions (Signed)
Medication Instructions:  The current medical regimen is effective;  continue present plan and medications.  *If you need a refill on your cardiac medications before your next appointment, please call your pharmacy*  Follow-Up: At [facility], you and your health needs are our priority.  As part of our continuing mission to provide you with exceptional heart care, we have created designated Provider Care Teams.  These Care Teams include your primary Cardiologist (physician) and Advanced Practice Providers (APPs -  Physician Assistants and Nurse Practitioners) who all work together to provide you with the care you need, when you need it.  We recommend signing up for the patient portal called "MyChart".  Sign up information is provided on this After Visit Summary.  MyChart is used to connect with patients for Virtual Visits (Telemedicine).  Patients are able to view lab/test results, encounter notes, upcoming appointments, etc.  Non-urgent messages can be sent to your provider as well.   To learn more about what you can do with MyChart, go to [email or web address].    Your next appointment:   12 month(s)  The format for your next appointment:   In Person  Provider:   Mertie Moores, MD  Thank you for choosing [facility]!!

## 2019-09-18 NOTE — Progress Notes (Signed)
Cardiology Office Note   Date:  09/18/2019   ID:  Matthew Sullivan, Matthew Sullivan November 01, 1945, MRN [id number]  PCP:  Tonia Ghent, MD  Cardiologist: [name] MD - now [place]   Chief Complaint  Patient presents with  . Atrial Fibrillation  . Hypertension     Previous notes from [place]:  Matthew Sullivan is a 74 y.o. male who presents for follow-up scheduled office visit.  The patient has a past history of left atrial myxoma removed by Dr. Roxan Hockey on 04/09/13 The patient does not have any coronary disease by cardiac catheterization . The patient did have problems with postoperative atrial fibrillation. He underwent elective cardioversion on 06/25/13 . Since then he has remained in normal sinus rhythm on tapering doses of amiodarone. We subsequently stopped his amiodarone altogether. He has not been aware of any recurrent atrial fibrillation. He denies chest pain or shortness of breath. 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  However, he had recent left ankle surgery and so he is out of work for a while.  Dr. Percell Miller as his orthopedist.  He has a history of hyperlipidemia. His most recent lipids were on 04/09/15 which showed a cholesterol of 124 and an LDL of 67 and triglycerides of 73.  He is tolerating his statin therapy without side effects.  December 15, 2015: Seen for the first time - transfer from [place] .  Seen for PAF - occurred following excision of an atrial myxoma.  2 weeks ago - he noticed some palpitations He was out working in the heat.   Felt his pulse, went to the ER, was cardioverted in the ER . Is now back on Xarelto.20 mg a day  Continues on metoprolol 25 BID  And atorvastatin 40 mg a day   Dec. 14, 2017:  Doing well.  BP is a bit elevate at home and is elevated here.  Still eating salt - bacon, potted meat, country ham, vienna sausages   January 23, 2017:    Doing well. Works at [employer].   Very busy.  Able to work without CP  or dyspnea.   Feb. 11, 2019:  Doing well. BP is normal at home.   A bit elevated here Staying busy.   Still does construction  ( replacement windows, doors, vinyl siding) Has hyperlipidemia No major bleeding issues   April 22, 2019: Brentin is seen today for follow-up of his atrial myxoma excision and postoperative atrial fibrillation. No cp , no dyspnea.  Stays busy with construction work  BP readings are great at home.   A bitl elevated here in the office Tries to avoid salt   He needs to have Mohs surgery on November 2.  He inquired about his Xarelto medication.  He will hold his Xarelto on Oct.   30th, November 1, and then on November 2.  Restart Xarelto when okay with a dermatologist.  He will be at low risk for his upcoming surgery.  September 18, 2019  Matthew Sullivan is seen today for follow-up of his paroxysmal atrial fibrillation. Just getting over covid in early Feb.   Was in the hospital for 10 days. Breathing is better .   Still is generally weak butimproving .   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 Atrial fibrillation, currently in sinus rhythm   . Atrial myxoma   . Cervical  osteoarthritis   . History of TIA (transient ischemic attack)   . History of tinnitus   . Hyperlipidemia   . Hypertension   . Melanoma ([place])    per Dr. Allyson Sabal, local excision, no chemo/no rady tx.     Past Surgical History:  Procedure Laterality Date  . ANKLE ARTHROSCOPY WITH ARTHRODESIS Left 08/26/2015   Procedure: LEFT ANKLE ARTHROSCOPY TIBIOTALAR ARTHRODESIS;  Surgeon: [name], MD;  Location: [facility];  Service: Orthopedics;  Laterality: Left;  . APPENDECTOMY    . BACK SURGERY    . CARDIOVERSION N/A 06/25/2013   Procedure: CARDIOVERSION;  Surgeon: [name], MD;  Location: [facility] ENDOSCOPY;  Service: Cardiovascular;  Laterality: N/A;  . EXCISION OF ATRIAL MYXOMA Left  04/09/2013   Procedure: EXCISION OF ATRIAL MYXOMA;  Surgeon: [name], MD;  Location: [facility];  Service: Open Heart Surgery;  Laterality: Left;  . INTRAOPERATIVE TRANSESOPHAGEAL ECHOCARDIOGRAM N/A 04/09/2013   Procedure: INTRAOPERATIVE TRANSESOPHAGEAL ECHOCARDIOGRAM;  Surgeon: [name], MD;  Location: [facility];  Service: Open Heart Surgery;  Laterality: N/A;  . LEFT HEART CATHETERIZATION WITH CORONARY ANGIOGRAM N/A 04/07/2013   Procedure: LEFT HEART CATHETERIZATION WITH CORONARY ANGIOGRAM;  Surgeon: [name], MD;  Location: [facility] CATH LAB;  Service: Cardiovascular;  Laterality: N/A;  . NASAL SEPTUM SURGERY    . NOSE SURGERY    . ROTATOR CUFF REPAIR     both shoulders     Current Outpatient Medications  Medication Sig Dispense Refill  . atorvastatin (LIPITOR) 80 MG tablet Take 0.5 tablets (40 mg total) by mouth at bedtime.    . hydrochlorothiazide (HYDRODIURIL) 25 MG tablet Take 1 tablet (25 mg total) by mouth daily. 90 tablet 3  . metoprolol tartrate (LOPRESSOR) 25 MG tablet TAKE 1/2 TABLET BY MOUTH 2 TIMES DAILY. 90 tablet 3  . potassium chloride SA (KLOR-CON) 20 MEQ tablet Take 1 tablet (20 mEq total) by mouth 2 (two) times daily. 180 tablet 3  . rivaroxaban (XARELTO) 20 MG TABS tablet Take 1 tablet (20 mg total) by mouth daily with supper. 90 tablet 1   No current facility-administered medications for this visit.    Allergies:   Patient has no known allergies.    Social History:  The patient  reports that he quit smoking about 42 years ago. He has never used smokeless tobacco. He reports current alcohol use of about 2.0 standard drinks of alcohol per week. He reports that he does not use drugs.   Family History:  The patient's family history includes Brain cancer in his father; Cancer in his father; Colon cancer in his mother; Liver cancer in his mother.    ROS:  Please see the history of present illness.   Otherwise, review of systems are positive for none.    All other systems are reviewed and negative.   Physical Exam: There were no vitals taken for this visit.  GEN:   Middle age man,  nad  HEENT: Normal NECK: No JVD; No carotid bruits LYMPHATICS: No lymphadenopathy CARDIAC: RRR , no murmurs, rubs, gallops RESPIRATORY:  Clear to auscultation without rales, wheezing or rhonchi  ABDOMEN: Soft, non-tender, non-distended MUSCULOSKELETAL:  No edema; No deformity  SKIN: Warm and dry NEUROLOGIC:  Alert and oriented x 3   EKG:      Recent Labs: 07/13/2019: B Natriuretic Peptide 566.7 07/18/2019: Magnesium 2.1 07/28/2019: Hemoglobin 13.0; Platelets 207.0 08/19/2019: ALT 27; BUN 12; Creatinine, Ser 0.83; Potassium 3.9; Sodium 138    Lipid  Panel    Component Value Date/Time   CHOL 139 04/22/2019 1010   TRIG 124 04/22/2019 1010   HDL 44 04/22/2019 1010   CHOLHDL 3.2 04/22/2019 1010   CHOLHDL 2.9 06/22/2016 0846   VLDL 14 06/22/2016 0846   LDLCALC 73 04/22/2019 1010      Wt Readings from Last 3 Encounters:  08/19/19 209 lb 4 oz (94.9 kg)  08/12/19 209 lb (94.8 kg)  07/28/19 202 lb 12.8 oz (92 kg)      ASSESSMENT AND PLAN:  1. history of left atrial myxoma successfully excised on 04/09/13:   He is doing well is not having any episodes of shortness of breath  2.  Postoperative paroxysmal atrial fibrillation :   CHADS 2VASC = 2 ( age , HTN). Remains in normal sinus rhythm. Continue Xarelto.  3. hypertensive heart disease -    blood pressures well controlled. Continue good diet, exercise, weight loss.  4. Hypercholesterolemia -   Lipid levels look great in October. I'll see him again in 1 year.   Current medicines are reviewed at length with the patient today.  The patient does not have concerns regarding medicines.  The following changes have been made:  no change  Labs/ tests ordered today include:   No orders of the defined types were placed in this encounter.     Mertie Moores, MD  09/18/2019 8:28 AM    [address],  [address] [place], [place]  [address] Pager [phone] Phone: [phone]; Fax: [phone]

## 2019-11-24 ENCOUNTER — Other Ambulatory Visit: Payer: Self-pay

## 2019-11-24 MED ORDER — ATORVASTATIN CALCIUM 80 MG PO TABS: 40.0000 mg | ORAL_TABLET | Freq: Every day | ORAL | 3 refills | Status: DC

## 2019-11-24 NOTE — Telephone Encounter (Signed)
Pt's medication was sent to pt's pharmacy as requested by [facility] order pharmacy. Confirmation received.

## 2019-11-25 ENCOUNTER — Other Ambulatory Visit: Payer: Self-pay

## 2020-01-25 ENCOUNTER — Other Ambulatory Visit: Payer: Self-pay | Admitting: Cardiovascular Disease

## 2020-01-26 NOTE — Telephone Encounter (Signed)
Pt last saw Dr Nahser 09/18/19, last labs 08/19/19 Creat 0.83, age 73, weight 96.8kg, CrCl 105.29, based on CrCl pt is on appropriate dosage of Xarelto 20mg QD.  Will refill rx.  °

## 2020-05-17 ENCOUNTER — Other Ambulatory Visit: Payer: Self-pay | Admitting: Cardiovascular Disease

## 2020-05-17 NOTE — Telephone Encounter (Signed)
Pt last saw Dr Acie Fredrickson 09/18/19, last labs 08/19/19 Creat 0.83, age 74, weight 96.8kg, CrCl 105.29, based on CrCl pt is on appropriate dosage of Xarelto 20mg  QD.  Will refill rx.

## 2020-06-15 ENCOUNTER — Other Ambulatory Visit: Payer: Self-pay | Admitting: Cardiovascular Disease

## 2020-07-19 DIAGNOSIS — C4442 Squamous cell carcinoma of skin of scalp and neck: Secondary | ICD-10-CM | POA: Diagnosis not present

## 2020-07-19 DIAGNOSIS — D485 Neoplasm of uncertain behavior of skin: Secondary | ICD-10-CM | POA: Diagnosis not present

## 2020-07-19 DIAGNOSIS — Z85820 Personal history of malignant melanoma of skin: Secondary | ICD-10-CM | POA: Diagnosis not present

## 2020-07-19 DIAGNOSIS — L821 Other seborrheic keratosis: Secondary | ICD-10-CM | POA: Diagnosis not present

## 2020-07-19 DIAGNOSIS — D229 Melanocytic nevi, unspecified: Secondary | ICD-10-CM | POA: Diagnosis not present

## 2020-07-19 DIAGNOSIS — D044 Carcinoma in situ of skin of scalp and neck: Secondary | ICD-10-CM | POA: Diagnosis not present

## 2020-07-19 DIAGNOSIS — L570 Actinic keratosis: Secondary | ICD-10-CM | POA: Diagnosis not present

## 2020-07-19 DIAGNOSIS — Z85828 Personal history of other malignant neoplasm of skin: Secondary | ICD-10-CM | POA: Diagnosis not present

## 2020-07-19 DIAGNOSIS — L905 Scar conditions and fibrosis of skin: Secondary | ICD-10-CM | POA: Diagnosis not present

## 2020-07-19 DIAGNOSIS — L814 Other melanin hyperpigmentation: Secondary | ICD-10-CM | POA: Diagnosis not present

## 2020-08-02 ENCOUNTER — Other Ambulatory Visit: Payer: Self-pay | Admitting: Cardiovascular Disease

## 2020-08-21 ENCOUNTER — Other Ambulatory Visit: Payer: Self-pay | Admitting: Cardiovascular Disease

## 2020-09-11 ENCOUNTER — Other Ambulatory Visit: Payer: Self-pay | Admitting: Cardiovascular Disease

## 2020-10-11 DIAGNOSIS — L570 Actinic keratosis: Secondary | ICD-10-CM | POA: Diagnosis not present

## 2020-10-11 DIAGNOSIS — D229 Melanocytic nevi, unspecified: Secondary | ICD-10-CM | POA: Diagnosis not present

## 2020-10-11 DIAGNOSIS — Z85828 Personal history of other malignant neoplasm of skin: Secondary | ICD-10-CM | POA: Diagnosis not present

## 2020-10-11 DIAGNOSIS — L819 Disorder of pigmentation, unspecified: Secondary | ICD-10-CM | POA: Diagnosis not present

## 2020-10-11 DIAGNOSIS — D1801 Hemangioma of skin and subcutaneous tissue: Secondary | ICD-10-CM | POA: Diagnosis not present

## 2020-10-11 DIAGNOSIS — L821 Other seborrheic keratosis: Secondary | ICD-10-CM | POA: Diagnosis not present

## 2020-10-11 DIAGNOSIS — L905 Scar conditions and fibrosis of skin: Secondary | ICD-10-CM | POA: Diagnosis not present

## 2020-10-11 DIAGNOSIS — L814 Other melanin hyperpigmentation: Secondary | ICD-10-CM | POA: Diagnosis not present

## 2020-10-11 DIAGNOSIS — L812 Freckles: Secondary | ICD-10-CM | POA: Diagnosis not present

## 2020-10-13 ENCOUNTER — Other Ambulatory Visit: Payer: Self-pay | Admitting: Cardiovascular Disease

## 2020-10-17 ENCOUNTER — Other Ambulatory Visit: Payer: Self-pay | Admitting: Family Medicine

## 2020-10-17 DIAGNOSIS — I119 Hypertensive heart disease without heart failure: Secondary | ICD-10-CM

## 2020-10-25 ENCOUNTER — Telehealth: Payer: Self-pay | Admitting: Cardiovascular Disease

## 2020-10-25 MED ORDER — ATORVASTATIN CALCIUM 80 MG PO TABS: 0.5000 | ORAL_TABLET | Freq: Every day | ORAL | 0 refills | Status: DC

## 2020-10-25 NOTE — Telephone Encounter (Signed)
Pt's medication was sent to pt's pharmacy as requested. Confirmation received.  °

## 2020-10-25 NOTE — Telephone Encounter (Signed)
*  STAT* If patient is at the pharmacy, call can be transferred to refill team.   1. Which medications need to be refilled? (please list name of each medication and dose if known) atorvastatin (LIPITOR) 80 MG tablet  2. Which pharmacy/location (including street and city if local pharmacy) is medication to be sent to?Preferred Pharmacies    [place], [place]     3. Do they need a 30 day or 90 day supply? 90 day supply  Pt has an upcoming appt with Dr. Acie Fredrickson 01/07/21 @ 8:20am

## 2020-10-29 ENCOUNTER — Other Ambulatory Visit: Payer: Self-pay

## 2020-10-29 ENCOUNTER — Other Ambulatory Visit (INDEPENDENT_AMBULATORY_CARE_PROVIDER_SITE_OTHER): Payer: Medicare HMO

## 2020-10-29 DIAGNOSIS — I119 Hypertensive heart disease without heart failure: Secondary | ICD-10-CM

## 2020-10-29 LAB — CBC WITH DIFFERENTIAL/PLATELET
Basophils Absolute: 0 10*3/uL (ref 0.0–0.1)
Basophils Relative: 0.3 % (ref 0.0–3.0)
Eosinophils Absolute: 0.1 10*3/uL (ref 0.0–0.7)
Eosinophils Relative: 2.8 % (ref 0.0–5.0)
HCT: 40.7 % (ref 39.0–52.0)
Hemoglobin: 14 g/dL (ref 13.0–17.0)
Lymphocytes Relative: 30 % (ref 12.0–46.0)
Lymphs Abs: 1.3 10*3/uL (ref 0.7–4.0)
MCHC: 34.3 g/dL (ref 30.0–36.0)
MCV: 92.7 fl (ref 78.0–100.0)
Monocytes Absolute: 0.5 10*3/uL (ref 0.1–1.0)
Monocytes Relative: 11.2 % (ref 3.0–12.0)
Neutro Abs: 2.4 10*3/uL (ref 1.4–7.7)
Neutrophils Relative %: 55.7 % (ref 43.0–77.0)
Platelets: 148 10*3/uL — ABNORMAL LOW (ref 150.0–400.0)
RBC: 4.39 Mil/uL (ref 4.22–5.81)
RDW: 13.8 % (ref 11.5–15.5)
WBC: 4.2 10*3/uL (ref 4.0–10.5)

## 2020-10-29 LAB — LIPID PANEL
Cholesterol: 152 mg/dL (ref 0–200)
HDL: 42.3 mg/dL (ref >39.00)
LDL Cholesterol: 82 mg/dL (ref 0–99)
NonHDL: 109.83
Total CHOL/HDL Ratio: 4
Triglycerides: 141 mg/dL (ref 0.0–149.0)
VLDL: 28.2 mg/dL (ref 0.0–40.0)

## 2020-10-29 LAB — COMPREHENSIVE METABOLIC PANEL
ALT: 32 U/L (ref 0–53)
AST: 23 U/L (ref 0–37)
Albumin: 4.2 g/dL (ref 3.5–5.2)
Alkaline Phosphatase: 67 U/L (ref 39–117)
BUN: 15 mg/dL (ref 6–23)
CO2: 30 mEq/L (ref 19–32)
Calcium: 8.8 mg/dL (ref 8.4–10.5)
Chloride: 101 mEq/L (ref 96–112)
Creatinine, Ser: 0.85 mg/dL (ref 0.40–1.50)
GFR: 84.48 mL/min (ref >60.00)
Glucose, Bld: 116 mg/dL — ABNORMAL HIGH (ref 70–99)
Potassium: 4 mEq/L (ref 3.5–5.1)
Sodium: 139 mEq/L (ref 135–145)
Total Bilirubin: 1.1 mg/dL (ref 0.2–1.2)
Total Protein: 6.3 g/dL (ref 6.0–8.3)

## 2020-10-29 LAB — TSH: TSH: 5.54 u[IU]/mL — ABNORMAL HIGH (ref 0.35–4.50)

## 2020-11-05 ENCOUNTER — Ambulatory Visit (INDEPENDENT_AMBULATORY_CARE_PROVIDER_SITE_OTHER): Payer: Medicare HMO | Admitting: Family Medicine

## 2020-11-05 ENCOUNTER — Encounter: Payer: Self-pay | Admitting: Family Medicine

## 2020-11-05 ENCOUNTER — Other Ambulatory Visit: Payer: Self-pay

## 2020-11-05 VITALS — BP 124/80 | HR 59 | Temp 97.5°F | Ht 72.0 in | Wt 221.0 lb

## 2020-11-05 DIAGNOSIS — E782 Mixed hyperlipidemia: Secondary | ICD-10-CM

## 2020-11-05 DIAGNOSIS — I10 Essential (primary) hypertension: Secondary | ICD-10-CM | POA: Diagnosis not present

## 2020-11-05 DIAGNOSIS — Z23 Encounter for immunization: Secondary | ICD-10-CM

## 2020-11-05 DIAGNOSIS — Z0000 Encounter for general adult medical examination without abnormal findings: Secondary | ICD-10-CM

## 2020-11-05 DIAGNOSIS — R7989 Other specified abnormal findings of blood chemistry: Secondary | ICD-10-CM

## 2020-11-05 DIAGNOSIS — Z1211 Encounter for screening for malignant neoplasm of colon: Secondary | ICD-10-CM

## 2020-11-05 DIAGNOSIS — I480 Paroxysmal atrial fibrillation: Secondary | ICD-10-CM | POA: Diagnosis not present

## 2020-11-05 DIAGNOSIS — Z7189 Other specified counseling: Secondary | ICD-10-CM

## 2020-11-05 NOTE — Progress Notes (Signed)
This visit occurred during the SARS-CoV-2 public health emergency.  Safety protocols were in place, including screening questions prior to the visit, additional usage of staff PPE, and extensive cleaning of exam room while observing appropriate contact time as indicated for disinfecting solutions.  I have personally reviewed the Medicare Annual Wellness questionnaire and have noted 1. The patient's medical and social history 2. Their use of alcohol, tobacco or illicit drugs 3. Their current medications and supplements 4. The patient's functional ability including ADL's, fall risks, home safety risks and hearing or visual             impairment. 5. Diet and physical activities 6. Evidence for depression or mood disorders  The patients weight, height, BMI have been recorded in the chart and visual acuity is per eye clinic.  I have made referrals, counseling and provided education to the patient based review of the above and I have provided the pt with a written personalized care plan for preventive services.  Provider list updated- see scanned forms.  Routine anticipatory guidance given to patient.  See health maintenance. The possibility exists that previously documented standard health maintenance information may have been brought forward from a previous encounter into this note.  If needed, that same information has been updated to reflect the current situation based on today's encounter.    Flu encouraged Shingles discussed with patient. PNA 2022 Tetanus discussed with patient.  May be cheaper pharmacy COVID-vaccine encouraged. D/w patient [id number] for colon cancer screening, including IFOB vs. colonoscopy.  Risks and benefits of both were discussed and patient voiced understanding.  Pt elects for: Cologuard.  Family history noted but given his age, his medical history, and his anticoagulation Cologuard is a reasonable choice.  Rationale discussed with patient.  He agrees.  If Cologuard is  negative, after 3-year span he would be approaching his 78th birthday and would likely cease colon cancer screening at all. Prostate cancer screening and PSA options (with potential risks and benefits of testing vs not testing) were discussed along with recent recs/guidelines.  He declined testing PSA at this point. Advance directive-wife designated if patient were incapacitated. Cognitive function addressed- see scanned forms- and if abnormal then additional documentation follows.   Mild inc in TSH, d/w pt.  Mild fatigue noted and this is expected given his workload.Marland Kitchen  He is still working hard, doing a lot of manual labor (yard work, Lawyer a deck).  D/w pt about monitoring.  No dysphagia.  No goiter.  Hypertension:    Using medication without problems or lightheadedness: yes Chest pain with exertion:no Edema:no Short of breath:no  Elevated Cholesterol: Using medications without problems: yes Muscle aches: no Diet compliance: yes Exercise: yes  Anticoagulation with xarelto.  H/o myxoma.  Compliant.  No bleeding.  H/o melanoma with routine derm f/u   Saw palmetto helped with urine stream per patient report.   PMH and SH reviewed  Meds, vitals, and allergies reviewed.   ROS: Per HPI.  Unless specifically indicated otherwise in HPI, the patient denies:  General: fever. Eyes: acute vision changes ENT: sore throat Cardiovascular: chest pain Respiratory: SOB GI: vomiting GU: dysuria Musculoskeletal: acute back pain Derm: acute rash Neuro: acute motor dysfunction Psych: worsening mood Endocrine: polydipsia Heme: bleeding Allergy: hayfever  GEN: nad, alert and oriented HEENT: ncat NECK: supple w/o LA, no thyromegaly. CV: rrr. PULM: ctab, no inc wob ABD: soft, +bs EXT: no edema SKIN: no acute rash

## 2020-11-05 NOTE — Patient Instructions (Signed)
Pneumonia shot today.  I would get a flu shot each fall.   Take care.  Glad to see you. Update me as needed.

## 2020-11-07 DIAGNOSIS — R7989 Other specified abnormal findings of blood chemistry: Secondary | ICD-10-CM | POA: Insufficient documentation

## 2020-11-07 NOTE — Assessment & Plan Note (Signed)
- 

## 2020-11-07 NOTE — Assessment & Plan Note (Signed)
Mild inc in TSH, d/w pt.  Mild fatigue noted and this is expected given his workload.Marland Kitchen  He is still working hard, doing a lot of manual labor (yard work, Lawyer a deck).  D/w pt about monitoring.  No dysphagia.  No goiter.

## 2020-11-07 NOTE — Assessment & Plan Note (Signed)
Controlled.  Continue hydrochlorothiazide metoprolol.  Continue work on diet and exercise.

## 2020-11-07 NOTE — Assessment & Plan Note (Signed)
Advance directive- wife designated if patient were incapacitated.  

## 2020-11-07 NOTE — Assessment & Plan Note (Signed)
Continue Lipitor.  Continue diet and exercise. 

## 2020-11-07 NOTE — Assessment & Plan Note (Signed)
Flu encouraged Shingles discussed with patient. PNA 2022 Tetanus discussed with patient.  May be cheaper pharmacy COVID-vaccine encouraged. D/w patient [id number] for colon cancer screening, including IFOB vs. colonoscopy.  Risks and benefits of both were discussed and patient voiced understanding.  Pt elects for: Cologuard.  Family history noted but given his age, his medical history, and his anticoagulation Cologuard is a reasonable choice.  Rationale discussed with patient.  He agrees.  If Cologuard is negative, after 3-year span he would be approaching his 78th birthday and would likely cease colon cancer screening at all. Prostate cancer screening and PSA options (with potential risks and benefits of testing vs not testing) were discussed along with recent recs/guidelines.  He declined testing PSA at this point. Advance directive-wife designated if patient were incapacitated. Cognitive function addressed- see scanned forms- and if abnormal then additional documentation follows.

## 2020-11-11 ENCOUNTER — Other Ambulatory Visit: Payer: Self-pay | Admitting: Cardiovascular Disease

## 2020-11-12 NOTE — Telephone Encounter (Signed)
Pt's age 75, wt 100.2 kg, SCr 0.85, CrCl 104.78, last ov w/ PN 09/18/19.

## 2020-11-30 DIAGNOSIS — Z1211 Encounter for screening for malignant neoplasm of colon: Secondary | ICD-10-CM | POA: Diagnosis not present

## 2020-11-30 LAB — COLOGUARD: Cologuard: NEGATIVE

## 2020-12-03 ENCOUNTER — Other Ambulatory Visit: Payer: Self-pay | Admitting: Cardiovascular Disease

## 2020-12-08 LAB — COLOGUARD: COLOGUARD: NEGATIVE

## 2020-12-27 ENCOUNTER — Telehealth: Payer: Self-pay | Admitting: Family Medicine

## 2020-12-27 DIAGNOSIS — M25569 Pain in unspecified knee: Secondary | ICD-10-CM

## 2020-12-27 DIAGNOSIS — M25519 Pain in unspecified shoulder: Secondary | ICD-10-CM

## 2020-12-27 NOTE — Telephone Encounter (Signed)
Patient is wanting a referral to [facility] for right shoulder and right knee pain. Wife was unsure if patient had talked to Dr. Damita Dunnings about this in the past and I did not see anything in the L[facility] notes. I advised wife patient may need OV first for documentation for ortho before referral can be done. Please advise.

## 2020-12-27 NOTE — Telephone Encounter (Signed)
Matthew Sullivan called in wanted to know if she can get a referral for her husband to the same location she is going which [name] [id number] Its for his right knee and shoulder

## 2020-12-28 NOTE — Telephone Encounter (Signed)
Referral placed  Thanks!

## 2020-12-28 NOTE — Addendum Note (Signed)
Addended by: Tonia Ghent on: 12/28/2020 08:48 AM   Modules accepted: Orders

## 2021-01-04 ENCOUNTER — Other Ambulatory Visit: Payer: Self-pay | Admitting: Cardiovascular Disease

## 2021-01-07 ENCOUNTER — Ambulatory Visit: Payer: Medicare HMO | Admitting: Cardiovascular Disease

## 2021-01-21 DIAGNOSIS — M67911 Unspecified disorder of synovium and tendon, right shoulder: Secondary | ICD-10-CM | POA: Diagnosis not present

## 2021-01-25 ENCOUNTER — Other Ambulatory Visit: Payer: Self-pay | Admitting: Cardiovascular Disease

## 2021-03-07 ENCOUNTER — Encounter: Payer: Self-pay | Admitting: Cardiovascular Disease

## 2021-03-07 ENCOUNTER — Ambulatory Visit: Payer: Medicare HMO | Admitting: Cardiovascular Disease

## 2021-03-07 ENCOUNTER — Other Ambulatory Visit: Payer: Self-pay

## 2021-03-07 VITALS — BP 148/90 | HR 69 | Ht 72.0 in | Wt 225.2 lb

## 2021-03-07 DIAGNOSIS — I480 Paroxysmal atrial fibrillation: Secondary | ICD-10-CM

## 2021-03-07 DIAGNOSIS — I10 Essential (primary) hypertension: Secondary | ICD-10-CM | POA: Diagnosis not present

## 2021-03-07 DIAGNOSIS — E782 Mixed hyperlipidemia: Secondary | ICD-10-CM | POA: Diagnosis not present

## 2021-03-07 NOTE — Progress Notes (Signed)
Cardiology Office Note   Date:  03/07/2021   ID:  Matthew Sullivan, Matthew Sullivan 07/06/1946, MRN [id number]  PCP:  Tonia Ghent, MD  Cardiologist: [name] MD - now Isaid Salvia   No chief complaint on file.    Previous notes from [facility]:  Matthew Sullivan is a 75 y.o. male who presents for follow-up scheduled office visit.  The patient has a past history of left atrial myxoma removed by Dr. Roxan Hockey on 04/09/13 The patient does not have any coronary disease by cardiac catheterization .  The patient did have problems with postoperative atrial fibrillation.  He underwent elective cardioversion on 06/25/13 .  Since then he has remained in normal sinus rhythm on tapering doses of amiodarone.  We subsequently stopped his amiodarone altogether.  He has not been aware of any recurrent atrial fibrillation.  He denies chest pain or shortness of breath.  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  However, he had recent left ankle surgery and so he is out of work for a while.  Dr. Percell Miller as his orthopedist.  He has a history of hyperlipidemia.  His most recent lipids were on 04/09/15 which showed a cholesterol of 124 and an LDL of 67 and triglycerides of 73.  He is tolerating his statin therapy without side effects.  December 15, 2015: Seen for the first time - transfer from [place] .  Seen for PAF - occurred following excision of an atrial myxoma.  2 weeks ago - he noticed some palpitations He was out working in the heat.   Felt his pulse, went to the ER, was cardioverted in the ER . Is now back on Xarelto.20 mg a day  Continues on metoprolol 25 BID  And atorvastatin 40 mg a day   Dec. 14, 2017:  Doing well.  BP is a bit elevate at home and is elevated here.  Still eating salt - bacon, potted meat, country ham, vienna sausages   January 23, 2017:    Doing well. Works at [employer].   Very busy.  Able to work without CP or dyspnea.   Feb. 11, 2019:  Doing well. BP is  normal at home.   A bit elevated here Staying busy.   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 No cp , no dyspnea.  Stays busy with construction work  BP readings are great at home.   A bitl elevated here in the office Tries to avoid salt   He needs to have Mohs surgery on November 2.  He inquired about his Xarelto medication.  He will hold his Xarelto on Oct.   30th, November 1, and then on November 2.  Restart Xarelto when okay with a dermatologist.  He will be at low risk for his upcoming surgery.  September 18, 2019  Matthew Sullivan is seen today for follow-up of his paroxysmal atrial fibrillation. Just getting over covid in early Feb.   Was in the hospital for 10 days. Breathing is better .   Still is generally weak but improving .   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 29, 2022: Matthew Sullivan is seen for follow up of his PAF.   BP is slightly elevated here today . BP  has been normal at home .     Past Medical History:  Diagnosis Date   Atrial fibrillation, currently in sinus rhythm    Atrial myxoma    Cervical osteoarthritis    History of TIA (transient ischemic attack)    History of tinnitus    Hyperlipidemia    Hypertension    Melanoma ([place])    per Dr. Allyson Sabal, local excision, no chemo/no rady tx.     Past Surgical History:  Procedure Laterality Date   ANKLE ARTHROSCOPY WITH ARTHRODESIS Left 08/26/2015   Procedure: LEFT ANKLE ARTHROSCOPY TIBIOTALAR ARTHRODESIS;  Surgeon: [name], MD;  Location: [facility];  Service: Orthopedics;  Laterality: Left;   APPENDECTOMY     BACK SURGERY     CARDIOVERSION N/A 06/25/2013   Procedure: CARDIOVERSION;  Surgeon: [name], MD;  Location: [facility] ENDOSCOPY;   Service: Cardiovascular;  Laterality: N/A;   EXCISION OF ATRIAL MYXOMA Left 04/09/2013   Procedure: EXCISION OF ATRIAL MYXOMA;  Surgeon: [name], MD;  Location: [facility];  Service: Open Heart Surgery;  Laterality: Left;   INTRAOPERATIVE TRANSESOPHAGEAL ECHOCARDIOGRAM N/A 04/09/2013   Procedure: INTRAOPERATIVE TRANSESOPHAGEAL ECHOCARDIOGRAM;  Surgeon: [name], MD;  Location: [facility];  Service: Open Heart Surgery;  Laterality: N/A;   LEFT HEART CATHETERIZATION WITH CORONARY ANGIOGRAM N/A 04/07/2013   Procedure: LEFT HEART CATHETERIZATION WITH CORONARY ANGIOGRAM;  Surgeon: [name], MD;  Location: [facility] CATH LAB;  Service: Cardiovascular;  Laterality: N/A;   NASAL SEPTUM SURGERY     NOSE SURGERY     ROTATOR CUFF REPAIR     both shoulders     Current Outpatient Medications  Medication Sig Dispense Refill   atorvastatin (LIPITOR) 80 MG tablet TAKE 1/2 TABLET (40 MG TOTAL) BY MOUTH AT BEDTIME. PLEASE KEEP UPCOMING APPT IN JULY 2022 WITH DR. Acie Fredrickson BEFORE ANYMORE REFILLS. 45 tablet 0   hydrochlorothiazide (HYDRODIURIL) 25 MG tablet TAKE 1 TABLET BY MOUTH DAILY. PLEASE MAKE YEARLY APPT WITH DR. Acie Fredrickson FOR FEBRUARY BEFORE ANYMORE REFILLS. 1ST ATTEMPT 90 tablet 1   metoprolol tartrate (LOPRESSOR) 25 MG tablet TAKE 1/2 TABLET BY MOUTH 2 TIMES DAILY.  PLEASE KEEP SCHEDULED APPOINTMENT FOR FURTHER REFILLS 90 tablet 0   potassium chloride SA (KLOR-CON) 20 MEQ tablet TAKE 1 TABLET BY MOUTH 2 TIMES DAILY. PLEASE MAKE YEARLY APPT WITH DR. Acie Fredrickson FOR MARCH 2022 BEFORE ANYMORE REFILLS. 180 tablet 0   saw palmetto 500 MG capsule Take 500 mg by mouth 2 (two) times daily.     XARELTO 20 MG TABS tablet TAKE 1 TABLET (20 MG TOTAL) BY MOUTH DAILY WITH SUPPER. 30 tablet 5   No current facility-administered medications for this visit.    Allergies:   Patient has no known allergies.    Social History:  The patient  reports that he quit smoking about 43 years ago. His smoking use included  cigarettes. He has never used smokeless tobacco. He reports current alcohol use. He reports that he does not use drugs.   Family History:  The patient's family history includes Brain cancer in his father; Cancer in his father; Colon cancer in his mother; Liver cancer in his mother.    ROS:  Please see the history of present illness.   Otherwise, review of systems are positive for none.   All other systems are reviewed and negative.   Physical Exam: Blood pressure (!) 148/90, pulse 69, height 6' (1.829 m), weight 225 lb 3.2 oz (102.2 kg), SpO2 97 %.  GEN:  Well nourished, well developed in no acute distress HEENT: Normal NECK: No JVD; No carotid bruits LYMPHATICS: No lymphadenopathy CARDIAC: RRR , no murmurs, rubs, gallops RESPIRATORY:  Clear to auscultation without rales, wheezing or rhonchi  ABDOMEN: Soft, non-tender, non-distended MUSCULOSKELETAL:  No edema; No deformity  SKIN: Warm and dry NEUROLOGIC:  Alert and oriented x 3   EKG:    March 07, 2021: Normal sinus rhythm at 69.  No ST or T wave changes.  Recent Labs: 10/29/2020: ALT 32; BUN 15; Creatinine, Ser 0.85; Hemoglobin 14.0; Platelets 148.0; Potassium 4.0; Sodium 139; TSH 5.54    Lipid Panel    Component Value Date/Time   CHOL 152 10/29/2020 0853   CHOL 139 04/22/2019 1010   TRIG 141.0 10/29/2020 0853   HDL 42.30 10/29/2020 0853   HDL 44 04/22/2019 1010   CHOLHDL 4 10/29/2020 0853   VLDL 28.2 10/29/2020 0853   LDLCALC 82 10/29/2020 0853   LDLCALC 73 04/22/2019 1010      Wt Readings from Last 3 Encounters:  03/07/21 225 lb 3.2 oz (102.2 kg)  11/05/20 221 lb (100.2 kg)  09/18/19 213 lb 6 oz (96.8 kg)      ASSESSMENT AND PLAN:  1. history of left atrial myxoma successfully excised on 04/09/13:      2.  Postoperative paroxysmal atrial fibrillation :   CHADS 2VASC = 2 ( age , HTN). Cont xarelto Is nsr today    3. hypertensive heart disease -    BP is normal at home.   A little elevated here. Advised  salt restriction.  Advised carb reduction which will help with weight loss    4. Hypercholesterolemia -  lipids frrom April were reviewed.    Current medicines are reviewed at length with the patient today.  The patient does not have concerns regarding medicines.  The following changes have been made:  no change  Labs/ tests ordered today include:   Orders Placed This Encounter  Procedures   EKG 12-Lead       Mertie Moores, MD  03/07/2021 8:45 AM    [place],  [address] [place], [place]  [address] Pager [phone] Phone: [phone]; Fax: [phone]

## 2021-03-07 NOTE — Patient Instructions (Signed)
Medication Instructions:  Your physician recommends that you continue on your current medications as directed. Please refer to the Current Medication list given to you today.  *If you need a refill on your cardiac medications before your next appointment, please call your pharmacy*   Lab Work: None Ordered If you have labs (blood work) drawn today and your tests are completely normal, you will receive your results only by: [place] (if you have MyChart) OR A paper copy in the mail If you have any lab test that is abnormal or we need to change your treatment, we will call you to review the results.   Testing/Procedures: None Ordered   Follow-Up: At [facility], you and your health needs are our priority.  As part of our continuing mission to provide you with exceptional heart care, we have created designated Provider Care Teams.  These Care Teams include your primary Cardiologist (physician) and Advanced Practice Providers (APPs -  Physician Assistants and Nurse Practitioners) who all work together to provide you with the care you need, when you need it.   Your next appointment:   1 year(s)  The format for your next appointment:   In Person  Provider:   You will see one of the following Advanced Practice Providers on your designated Care Team:   Richardson Dopp, PA-C Vin [place], [place]

## 2021-04-11 DIAGNOSIS — H2513 Age-related nuclear cataract, bilateral: Secondary | ICD-10-CM | POA: Diagnosis not present

## 2021-04-11 DIAGNOSIS — H40033 Anatomical narrow angle, bilateral: Secondary | ICD-10-CM | POA: Diagnosis not present

## 2021-04-12 ENCOUNTER — Other Ambulatory Visit: Payer: Self-pay | Admitting: Cardiovascular Disease

## 2021-04-12 DIAGNOSIS — Z85828 Personal history of other malignant neoplasm of skin: Secondary | ICD-10-CM | POA: Diagnosis not present

## 2021-04-12 DIAGNOSIS — D492 Neoplasm of unspecified behavior of bone, soft tissue, and skin: Secondary | ICD-10-CM | POA: Diagnosis not present

## 2021-04-12 DIAGNOSIS — H52209 Unspecified astigmatism, unspecified eye: Secondary | ICD-10-CM | POA: Diagnosis not present

## 2021-04-12 DIAGNOSIS — Z08 Encounter for follow-up examination after completed treatment for malignant neoplasm: Secondary | ICD-10-CM | POA: Diagnosis not present

## 2021-04-12 DIAGNOSIS — D225 Melanocytic nevi of trunk: Secondary | ICD-10-CM | POA: Diagnosis not present

## 2021-04-12 DIAGNOSIS — L814 Other melanin hyperpigmentation: Secondary | ICD-10-CM | POA: Diagnosis not present

## 2021-04-12 DIAGNOSIS — R58 Hemorrhage, not elsewhere classified: Secondary | ICD-10-CM | POA: Diagnosis not present

## 2021-04-12 DIAGNOSIS — H524 Presbyopia: Secondary | ICD-10-CM | POA: Diagnosis not present

## 2021-04-12 DIAGNOSIS — L819 Disorder of pigmentation, unspecified: Secondary | ICD-10-CM | POA: Diagnosis not present

## 2021-04-12 DIAGNOSIS — L821 Other seborrheic keratosis: Secondary | ICD-10-CM | POA: Diagnosis not present

## 2021-04-12 DIAGNOSIS — B079 Viral wart, unspecified: Secondary | ICD-10-CM | POA: Diagnosis not present

## 2021-04-12 DIAGNOSIS — H5203 Hypermetropia, bilateral: Secondary | ICD-10-CM | POA: Diagnosis not present

## 2021-04-12 DIAGNOSIS — Z85820 Personal history of malignant melanoma of skin: Secondary | ICD-10-CM | POA: Diagnosis not present

## 2021-04-20 ENCOUNTER — Other Ambulatory Visit: Payer: Self-pay | Admitting: Cardiovascular Disease

## 2021-04-20 DIAGNOSIS — M25511 Pain in right shoulder: Secondary | ICD-10-CM | POA: Diagnosis not present

## 2021-05-13 ENCOUNTER — Telehealth: Payer: Self-pay | Admitting: Cardiovascular Disease

## 2021-05-13 NOTE — Telephone Encounter (Signed)
Patients would like to do a provider switch to Dr. Oval Linsey

## 2021-05-26 ENCOUNTER — Other Ambulatory Visit: Payer: Self-pay | Admitting: Cardiovascular Disease

## 2021-05-26 NOTE — Telephone Encounter (Signed)
Pt last saw Dr Acie Fredrickson 03/07/21, last labs 10/29/20 Creat 0.85, age 75, weight 102.2kg, CrCl 106.88, based on CrCl pt is on appropriate dosage of Xarelto 20mg  QD for afib.  Will refill rx.

## 2021-06-06 ENCOUNTER — Telehealth: Payer: Self-pay | Admitting: Cardiovascular Disease

## 2021-06-06 NOTE — Telephone Encounter (Signed)
Spoke with patient. He reports a "thready pulse" and "chest pressure". Denies pain in jaw, arm, or back. Pressure does not get worse with activity, but heart rate does increase. States, "I don't feel like my self." Recommended for Matthew Sullivan to go to the ED. He stated, "I refuse to go." I explained that if he gets worse, it is recommended for him to go to the ED. He then put his wife on the phone. She asked for an appointment with Dr. Oval Linsey. Appointment made for 06/13/21. I advised Mrs. Mullarkey to take [name] to the ED if his symptoms get worse. She voiced understanding of the above.

## 2021-06-06 NOTE — Telephone Encounter (Signed)
Patient c/o Palpitations:  High priority if patient c/o lightheadedness, shortness of breath, or chest pain  How long have you had palpitations/irregular HR/ Afib? Are you having the symptoms now? Tightness in chest; lethargic  Are you currently experiencing lightheadedness, SOB or CP? No   Do you have a history of afib (atrial fibrillation) or irregular heart rhythm? Yes   Have you checked your BP or HR? (document readings if available): 129/78; 73  Are you experiencing any other symptoms? Yes

## 2021-06-10 ENCOUNTER — Ambulatory Visit (INDEPENDENT_AMBULATORY_CARE_PROVIDER_SITE_OTHER): Payer: Medicare HMO

## 2021-06-10 ENCOUNTER — Other Ambulatory Visit: Payer: Self-pay

## 2021-06-10 DIAGNOSIS — Z23 Encounter for immunization: Secondary | ICD-10-CM | POA: Diagnosis not present

## 2021-06-10 NOTE — Progress Notes (Signed)
Patient presented for Flu shot given by Sherrilee Gilles, CMA to left deltoid, patient voiced no concerns nor showed any signs of distress during injection.

## 2021-06-13 ENCOUNTER — Other Ambulatory Visit: Payer: Self-pay

## 2021-06-13 ENCOUNTER — Encounter (HOSPITAL_BASED_OUTPATIENT_CLINIC_OR_DEPARTMENT_OTHER): Payer: Self-pay | Admitting: Cardiovascular Disease

## 2021-06-13 ENCOUNTER — Ambulatory Visit (HOSPITAL_BASED_OUTPATIENT_CLINIC_OR_DEPARTMENT_OTHER): Payer: Medicare HMO | Admitting: Cardiovascular Disease

## 2021-06-13 VITALS — BP 134/80 | HR 63 | Ht 72.0 in | Wt 221.0 lb

## 2021-06-13 DIAGNOSIS — R400 Somnolence: Secondary | ICD-10-CM

## 2021-06-13 DIAGNOSIS — I119 Hypertensive heart disease without heart failure: Secondary | ICD-10-CM | POA: Diagnosis not present

## 2021-06-13 DIAGNOSIS — I480 Paroxysmal atrial fibrillation: Secondary | ICD-10-CM | POA: Diagnosis not present

## 2021-06-13 DIAGNOSIS — R7989 Other specified abnormal findings of blood chemistry: Secondary | ICD-10-CM

## 2021-06-13 DIAGNOSIS — Z79899 Other long term (current) drug therapy: Secondary | ICD-10-CM

## 2021-06-13 DIAGNOSIS — D151 Benign neoplasm of heart: Secondary | ICD-10-CM

## 2021-06-13 DIAGNOSIS — R0683 Snoring: Secondary | ICD-10-CM

## 2021-06-13 MED ORDER — METOPROLOL TARTRATE 25 MG PO TABS: 25.0000 mg | ORAL_TABLET | Freq: Two times a day (BID) | ORAL | 3 refills | Status: DC

## 2021-06-13 NOTE — Assessment & Plan Note (Signed)
He had a myxoma resected in 2014.  He is having recurrent atrial fibrillation.  We will repeat an echo to make sure there has not been any recurrence of his myxoma.

## 2021-06-13 NOTE — Patient Instructions (Addendum)
Medication Instructions:  INCREASE- Metoprolol Tartrate 25 mg by mouth twice a day  *If you need a refill on your cardiac medications before your next appointment, please call your pharmacy*   Lab Work: BMP, CBC, TSH, T3 Free T4, Magnesium  If you have labs (blood work) drawn today and your tests are completely normal, you will receive your results only by: [place] (if you have MyChart) OR A paper copy in the mail If you have any lab test that is abnormal or we need to change your treatment, we will call you to review the results.   Testing/Procedures: Your physician has recommended that you have a sleep study. This test records several body functions during sleep, including: brain activity, eye movement, oxygen and carbon dioxide blood levels, heart rate and rhythm, breathing rate and rhythm, the flow of air through your mouth and nose, snoring, body muscle movements, and chest and belly movement.  Your physician has requested that you have an echocardiogram. Echocardiography is a painless test that uses sound waves to create images of your heart. It provides your doctor with information about the size and shape of your heart and how well your heart's chambers and valves are working. This procedure takes approximately one hour. There are no restrictions for this procedure.  Your physician has recommended that you have a Cardioversion (DCCV). Electrical Cardioversion uses a jolt of electricity to your heart either through paddles or wired patches attached to your chest. This is a controlled, usually prescheduled, procedure. Defibrillation is done under light anesthesia in the hospital, and you usually go home the day of the procedure. This is done to get your heart back into a normal rhythm. You are not awake for the procedure. Please see the instruction sheet given to you today.   Follow-Up: At [facility], you and your health needs are our priority.  As part of our continuing  mission to provide you with exceptional heart care, we have created designated Provider Care Teams.  These Care Teams include your primary Cardiologist (physician) and Advanced Practice Providers (APPs -  Physician Assistants and Nurse Practitioners) who all work together to provide you with the care you need, when you need it.  We recommend signing up for the patient portal called "MyChart".  Sign up information is provided on this After Visit Summary.  MyChart is used to connect with patients for Virtual Visits (Telemedicine).  Patients are able to view lab/test results, encounter notes, upcoming appointments, etc.  Non-urgent messages can be sent to your provider as well.   To learn more about what you can do with MyChart, go to [email or web address].    Your next appointment:   2 month(s)  The format for your next appointment:   In Person  Provider:   Skeet Latch, MD   You have been referred to Cardiac Electrophysiologist in 1 Month   Other Instructions Dear Matthew Sullivan  You are scheduled for a Cardioversion on Thursday December 15th  with Dr. Acie Fredrickson.  Please arrive at the [facility] (Main Entrance A) at [facility]: [address] [place], [place] [address] at 7 am. (1 hour prior to procedure unless lab work is needed; if lab work is needed arrive 1.5 hours ahead)  DIET: Nothing to eat or drink after midnight except a sip of water with medications (see medication instructions below)  FYI: For your safety, and to allow [place] to monitor your vital signs accurately during the surgery/procedure we request that  if you have artificial nails, gel coating, SNS etc. Please have those removed prior to your surgery/procedure. Not having the nail coverings /polish removed may result in cancellation or delay of your surgery/procedure.   Medication Instructions: You will need to continue your anticoagulant after your procedure until you  are told by your  Provider that it is  safe to stop   Labs: Today   You must have a responsible person to drive you home and stay in the waiting area during your procedure. Failure to do so could result in cancellation.  Bring your insurance cards.  *Special Note: Every effort is made to have your procedure done on time. Occasionally there are emergencies that occur at the hospital that may cause delays. Please be patient if a delay does occur.

## 2021-06-13 NOTE — Assessment & Plan Note (Signed)
Check TSH, T3 and free T4.  Thyroid function has been abnormal

## 2021-06-13 NOTE — H&P (View-Only) (Signed)
Cardiology Office Note:    Date:  06/13/2021   ID:  Matthew Sullivan, Matthew Sullivan May 06, 1946, MRN [id number]  PCP:  Tonia Ghent, MD  Cardiologist:  Mertie Moores, MD    Referring MD: Tonia Ghent, MD   No chief complaint on file.   History of Present Illness:    Matthew Sullivan is a 75 y.o. male with a hx of hypertension, hyperlipidemia, atrial myxoma, atrial fibrillation, and TIA who presents today for evaluation. His L atrial myxoma was removed in 2013. No CAD on cath. He has post-op atrial fibrillation and underwent cardioversion on 06/2013. He was started on amiodarone which was subsequently discontinued.  On 06/06/21, he contacted Betha Loa, RN regarding  "thready pulse" and "chest pressure". He declined going to the ED though it was recommended if his symptoms worsened. His wife arranged an appointment for his palpitations.   Today, he is accompanied by his wife. He first had atrial fibrillation after his myxoma resection in 2014.  He had a recurrent episode in 2017 and had DCCV.  He was well until recently when he developed recurrent palpitations.  They have been bothering him everyday and preventing him from sleeping. About 2 weeks ago, his atrial fibrillation worsened. He woke-up in the morning with an erratic heart beat that would go back to normal rhythm at around 10PM to 11PM. He endorses associated central chest tightness. He is retired but he is worried about suddenly passing away while working because of his palpitations.  He endorses snoring but waking up feeling rested most mornings.  He does get tired during the day at times. The only time he experienced shortness of breath at night was in 2021 when he was infected with COVID. He had COVID pneumonia.  He has also undergone bilateral rotator cuff surgery but his bilateral shoulder pain still bothers him occasionally. His son is a paramedic. He denies any lightheadedness, headaches, syncope, orthopnea, PND, lower extremity edema  or exertional symptoms.  Past Medical History:  Diagnosis Date   Atrial fibrillation, currently in sinus rhythm    Atrial myxoma    Cervical osteoarthritis    History of TIA (transient ischemic attack)    History of tinnitus    Hyperlipidemia    Hypertension    Melanoma ([place])    per Dr. Allyson Sabal, local excision, no chemo/no rady tx.     Past Surgical History:  Procedure Laterality Date   ANKLE ARTHROSCOPY WITH ARTHRODESIS Left 08/26/2015   Procedure: LEFT ANKLE ARTHROSCOPY TIBIOTALAR ARTHRODESIS;  Surgeon: [name], MD;  Location: [place];  Service: Orthopedics;  Laterality: Left;   APPENDECTOMY     BACK SURGERY     CARDIOVERSION N/A 06/25/2013   Procedure: CARDIOVERSION;  Surgeon: [name], MD;  Location: [facility] ENDOSCOPY;  Service: Cardiovascular;  Laterality: N/A;   EXCISION OF ATRIAL MYXOMA Left 04/09/2013   Procedure: EXCISION OF ATRIAL MYXOMA;  Surgeon: [name], MD;  Location: [place];  Service: Open Heart Surgery;  Laterality: Left;   INTRAOPERATIVE TRANSESOPHAGEAL ECHOCARDIOGRAM N/A 04/09/2013   Procedure: INTRAOPERATIVE TRANSESOPHAGEAL ECHOCARDIOGRAM;  Surgeon: [name], MD;  Location: [place];  Service: Open Heart Surgery;  Laterality: N/A;   LEFT HEART CATHETERIZATION WITH CORONARY ANGIOGRAM N/A 04/07/2013   Procedure: LEFT HEART CATHETERIZATION WITH CORONARY ANGIOGRAM;  Surgeon: [name], MD;  Location[facility] CATH LAB;  Service: Cardiovascular;  Laterality: N/A;   NASAL SEPTUM SURGERY     NOSE SURGERY  ROTATOR CUFF REPAIR     both shoulders    Current Medications: Current Meds  Medication Sig   atorvastatin (LIPITOR) 80 MG tablet Take 0.5 tablets (40 mg total) by mouth daily.   hydrochlorothiazide (HYDRODIURIL) 25 MG tablet TAKE 1 TABLET BY MOUTH DAILY. PLEASE MAKE YEARLY APPT WITH DR. Acie Fredrickson FOR FEBRUARY BEFORE ANYMORE REFILLS. 1ST ATTEMPT   potassium chloride SA (KLOR-CON) 20 MEQ tablet TAKE 1 TABLET BY MOUTH 2  TIMES DAILY.   saw palmetto 500 MG capsule Take 500 mg by mouth 2 (two) times daily.   XARELTO 20 MG TABS tablet TAKE 1 TABLET BY MOUTH DAILY WITH SUPPER.   [DISCONTINUED] metoprolol tartrate (LOPRESSOR) 25 MG tablet TAKE 1/2 TABLET BY MOUTH 2 TIMES DAILY.     Allergies:   Patient has no known allergies.   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 There is no history of Prostate cancer.  ROS:   Please see the history of present illness.  (+) Palpitations (+) Chest pain (+) Snoring   (+) Bilateral shoulder pain All other systems reviewed and negative.   EKGs/Labs/Other Studies Reviewed:    The following studies were reviewed today: CTA Chest 07/13/19 1. Small filling defect is seen in lower lobe branch of right pulmonary artery consistent with small pulmonary embolus. Critical Value/emergent results were called by telephone at the time of interpretation on 07/13/2019 at 3:21 pm to [place] , who verbally acknowledged these results. 2.  Small cardiomegaly. 3. Large bilateral airspace opacities are noted concerning for multifocal pneumonia. 4. Aortic atherosclerosis. Aortic Atherosclerosis (ICD10-I70.0).  EKG:   06/13/21: Afib, rate 115 bpm  Recent Labs: 10/29/2020: ALT 32; BUN 15; Creatinine, Ser 0.85; Hemoglobin 14.0; Platelets 148.0; Potassium 4.0; Sodium 139; TSH 5.54   Recent Lipid Panel    Component Value Date/Time   CHOL 152 10/29/2020 0853   CHOL 139 04/22/2019 1010   TRIG 141.0 10/29/2020 0853   HDL 42.30 10/29/2020 0853   HDL 44 04/22/2019 1010   CHOLHDL 4 10/29/2020 0853   VLDL 28.2 10/29/2020 0853   LDLCALC 82 10/29/2020 0853   LDLCALC 73 04/22/2019 1010    CHA2DS2-VASc Score =   [ ] .  Therefore, the patient's annual risk of stroke is   %.        Physical Exam:    VS:  BP 134/80  Pulse 63    Ht 6' (1.829 m)    Wt 221 lb (100.2 kg)    SpO2 96%    BMI 29.97 kg/m  , BMI Body mass index is 29.97 kg/m. GENERAL:  Well appearing HEENT: Pupils equal round and reactive, fundi not visualized, oral mucosa unremarkable NECK:  No jugular venous distention, waveform within normal limits, carotid upstroke brisk and symmetric, no bruits, no thyromegaly LUNGS:  Clear to auscultation bilaterally HEART:  Tachycardic.  Irregularly Irregular.  PMI not displaced or sustained,S1 and S2 within normal limits, no S3, no S4, no clicks, no rubs, no murmurs ABD:  Flat, positive bowel sounds normal in frequency in pitch, no bruits, no rebound, no guarding, no midline pulsatile mass, no hepatomegaly, no splenomegaly EXT:  2 plus pulses throughout, no edema, no cyanosis no clubbing SKIN:  No rashes no nodules NEURO:  Cranial nerves II through XII grossly intact, motor grossly intact throughout PSYCH:  Cognitively intact, oriented to person place and time  ASSESSMENT:    1. Daytime somnolence   2. Benign hypertensive heart disease without heart failure   3. Myxoma of heart   4. PAF (paroxysmal atrial fibrillation) ([place])    5. Abnormal TSH   6. Medication management   7. Snoring    PLAN:    Benign hypertensive heart disease without heart failure Blood pressures well have been controlled.  Continue hydrochlorothiazide.  Increase metoprolol to 25 mg twice daily.  Myxoma of heart He had a myxoma resected in 2014.  He is having recurrent atrial fibrillation.  We will repeat an echo to make sure there has not been any recurrence of his myxoma.  PAF (paroxysmal atrial fibrillation) [facility]) Mr. Antenucci is back in atrial fibrillation and symptomatic.  Rates are poorly controlled.  We will increase his metoprolol to 25 mg twice daily.  He has not missed any doses of Xarelto.  We will set him up for outpatient cardioversion on 12/15.  Given that he snores we will get a sleep study as well.   Abnormal TSH Check TSH, T3 and free T4.  Thyroid function has been abnormal   In order of problems listed above:      Medication Adjustments/Labs and Tests Ordered: Current medicines are reviewed at length with the patient today.  Concerns regarding medicines are outlined above.  Orders Placed This Encounter  Procedures   CBC   Basic Metabolic Panel (BMET)   TSH   T3   T4, free   Magnesium   Ambulatory referral to Cardiac Electrophysiology   ECHOCARDIOGRAM COMPLETE   Split night study    Meds ordered this encounter  Medications   metoprolol tartrate (LOPRESSOR) 25 MG tablet    Sig: Take 1 tablet (25 mg total) by mouth 2 (two) times daily.    Dispense:  180 tablet    Refill:  3    Disposition: FU with Berlyn Saylor C. Oval Linsey, MD, [facility] in 2 months  I,Mykaella Javier,acting as a scribe for Skeet Latch, MD.,have documented all relevant documentation on the behalf of Skeet Latch, MD,as directed by  Skeet Latch, MD while in the presence of Skeet Latch, MD.  I, [place] Oval Linsey, MD have reviewed all documentation for this visit.  The documentation of the exam, diagnosis, procedures, and orders on  06/13/2021 are all accurate and complete.   Signed, Skeet Latch, MD  06/13/2021 6:56 PM    [place]

## 2021-06-13 NOTE — Progress Notes (Signed)
Cardiology Office Note:    Date:  06/13/2021   ID:  [name], [name] 01-Jul-1946, MRN [id number]  PCP:  Tonia Ghent, MD  Cardiologist:  Mertie Moores, MD    Referring MD: Tonia Ghent, MD   No chief complaint on file.   History of Present Illness:    Matthew Sullivan is a 75 y.o. male with a hx of hypertension, hyperlipidemia, atrial myxoma, atrial fibrillation, and TIA who presents today for evaluation. His L atrial myxoma was removed in 2013. No CAD on cath. He has post-op atrial fibrillation and underwent cardioversion on 06/2013. He was started on amiodarone which was subsequently discontinued.  On 06/06/21, he contacted Betha Loa, RN regarding  "thready pulse" and "chest pressure". He declined going to the ED though it was recommended if his symptoms worsened. His wife arranged an appointment for his palpitations.   Today, he is accompanied by his wife. He first had atrial fibrillation after his myxoma resection in 2014.  He had a recurrent episode in 2017 and had DCCV.  He was well until recently when he developed recurrent palpitations.  They have been bothering him everyday and preventing him from sleeping. About 2 weeks ago, his atrial fibrillation worsened. He woke-up in the morning with an erratic heart beat that would go back to normal rhythm at around 10PM to 11PM. He endorses associated central chest tightness. He is retired but he is worried about suddenly passing away while working because of his palpitations.  He endorses snoring but waking up feeling rested most mornings.  He does get tired during the day at times. The only time he experienced shortness of breath at night was in 2021 when he was infected with COVID. He had COVID pneumonia.  He has also undergone bilateral rotator cuff surgery but his bilateral shoulder pain still bothers him occasionally. His son is a paramedic. He denies any lightheadedness, headaches, syncope, orthopnea, PND, lower extremity edema  or exertional symptoms.  Past Medical History:  Diagnosis Date   Atrial fibrillation, currently in sinus rhythm    Atrial myxoma    Cervical osteoarthritis    History of TIA (transient ischemic attack)    History of tinnitus    Hyperlipidemia    Hypertension    Melanoma ([place])    per Dr. Allyson Sabal, local excision, no chemo/no rady tx.     Past Surgical History:  Procedure Laterality Date   ANKLE ARTHROSCOPY WITH ARTHRODESIS Left 08/26/2015   Procedure: LEFT ANKLE ARTHROSCOPY TIBIOTALAR ARTHRODESIS;  Surgeon: [name], MD;  Location: [place];  Service: Orthopedics;  Laterality: Left;   APPENDECTOMY     BACK SURGERY     CARDIOVERSION N/A 06/25/2013   Procedure: CARDIOVERSION;  Surgeon: [name], MD;  Location: [facility] ENDOSCOPY;  Service: Cardiovascular;  Laterality: N/A;   EXCISION OF ATRIAL MYXOMA Left 04/09/2013   Procedure: EXCISION OF ATRIAL MYXOMA;  Surgeon: [name], MD;  Location: [place];  Service: Open Heart Surgery;  Laterality: Left;   INTRAOPERATIVE TRANSESOPHAGEAL ECHOCARDIOGRAM N/A 04/09/2013   Procedure: INTRAOPERATIVE TRANSESOPHAGEAL ECHOCARDIOGRAM;  Surgeon: [name], MD;  Location: [place];  Service: Open Heart Surgery;  Laterality: N/A;   LEFT HEART CATHETERIZATION WITH CORONARY ANGIOGRAM N/A 04/07/2013   Procedure: LEFT HEART CATHETERIZATION WITH CORONARY ANGIOGRAM;  Surgeon: [name], MD;  Location: [facility] CATH LAB;  Service: Cardiovascular;  Laterality: N/A;   NASAL SEPTUM SURGERY     NOSE SURGERY  ROTATOR CUFF REPAIR     both shoulders    Current Medications: Current Meds  Medication Sig   atorvastatin (LIPITOR) 80 MG tablet Take 0.5 tablets (40 mg total) by mouth daily.   hydrochlorothiazide (HYDRODIURIL) 25 MG tablet TAKE 1 TABLET BY MOUTH DAILY. PLEASE MAKE YEARLY APPT WITH DR. Acie Fredrickson FOR FEBRUARY BEFORE ANYMORE REFILLS. 1ST ATTEMPT   potassium chloride SA (KLOR-CON) 20 MEQ tablet TAKE 1 TABLET BY MOUTH 2  TIMES DAILY.   saw palmetto 500 MG capsule Take 500 mg by mouth 2 (two) times daily.   XARELTO 20 MG TABS tablet TAKE 1 TABLET BY MOUTH DAILY WITH SUPPER.   [DISCONTINUED] metoprolol tartrate (LOPRESSOR) 25 MG tablet TAKE 1/2 TABLET BY MOUTH 2 TIMES DAILY.     Allergies:   Patient has no known allergies.   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 There is no history of Prostate cancer.  ROS:   Please see the history of present illness.  (+) Palpitations (+) Chest pain (+) Snoring   (+) Bilateral shoulder pain All other systems reviewed and negative.   EKGs/Labs/Other Studies Reviewed:    The following studies were reviewed today: CTA Chest 07/13/19 1. Small filling defect is seen in lower lobe branch of right pulmonary artery consistent with small pulmonary embolus. Critical Value/emergent results were called by telephone at the time of interpretation on 07/13/2019 at 3:21 pm to [place] , who verbally acknowledged these results. 2.  Small cardiomegaly. 3. Large bilateral airspace opacities are noted concerning for multifocal pneumonia. 4. Aortic atherosclerosis. Aortic Atherosclerosis (ICD10-I70.0).  EKG:   06/13/21: Afib, rate 115 bpm  Recent Labs: 10/29/2020: ALT 32; BUN 15; Creatinine, Ser 0.85; Hemoglobin 14.0; Platelets 148.0; Potassium 4.0; Sodium 139; TSH 5.54   Recent Lipid Panel    Component Value Date/Time   CHOL 152 10/29/2020 0853   CHOL 139 04/22/2019 1010   TRIG 141.0 10/29/2020 0853   HDL 42.30 10/29/2020 0853   HDL 44 04/22/2019 1010   CHOLHDL 4 10/29/2020 0853   VLDL 28.2 10/29/2020 0853   LDLCALC 82 10/29/2020 0853   LDLCALC 73 04/22/2019 1010    CHA2DS2-VASc Score =   [ ] .  Therefore, the patient's annual risk of stroke is   %.        Physical Exam:    VS:  BP 134/80  Pulse 63   Ht 6' (1.829 m)   Wt 221 lb (100.2 kg)   SpO2 96%   BMI 29.97 kg/m  , BMI Body mass index is 29.97 kg/m. GENERAL:  Well appearing HEENT: Pupils equal round and reactive, fundi not visualized, oral mucosa unremarkable NECK:  No jugular venous distention, waveform within normal limits, carotid upstroke brisk and symmetric, no bruits, no thyromegaly LUNGS:  Clear to auscultation bilaterally HEART:  Tachycardic.  Irregularly Irregular.  PMI not displaced or sustained,S1 and S2 within normal limits, no S3, no S4, no clicks, no rubs, no murmurs ABD:  Flat, positive bowel sounds normal in frequency in pitch, no bruits, no rebound, no guarding, no midline pulsatile mass, no hepatomegaly, no splenomegaly EXT:  2 plus pulses throughout, no edema, no cyanosis no clubbing SKIN:  No rashes no nodules NEURO:  Cranial nerves II through XII grossly intact, motor grossly intact throughout PSYCH:  Cognitively intact, oriented to person place and time  ASSESSMENT:    1. Daytime somnolence   2. Benign hypertensive heart disease without heart failure   3. Myxoma of heart   4. PAF (paroxysmal atrial fibrillation) ([place])    5. Abnormal TSH   6. Medication management   7. Snoring    PLAN:    Benign hypertensive heart disease without heart failure Blood pressures well have been controlled.  Continue hydrochlorothiazide.  Increase metoprolol to 25 mg twice daily.  Myxoma of heart He had a myxoma resected in 2014.  He is having recurrent atrial fibrillation.  We will repeat an echo to make sure there has not been any recurrence of his myxoma.  PAF (paroxysmal atrial fibrillation) [facility]) Matthew Sullivan is back in atrial fibrillation and symptomatic.  Rates are poorly controlled.  We will increase his metoprolol to 25 mg twice daily.  He has not missed any doses of Xarelto.  We will set him up for outpatient cardioversion on 12/15.  Given that he snores we will get a sleep study as well.   Abnormal TSH Check TSH, T3 and free T4.  Thyroid function has been abnormal   In order of problems listed above:      Medication Adjustments/Labs and Tests Ordered: Current medicines are reviewed at length with the patient today.  Concerns regarding medicines are outlined above.  Orders Placed This Encounter  Procedures   CBC   Basic Metabolic Panel (BMET)   TSH   T3   T4, free   Magnesium   Ambulatory referral to Cardiac Electrophysiology   ECHOCARDIOGRAM COMPLETE   Split night study    Meds ordered this encounter  Medications   metoprolol tartrate (LOPRESSOR) 25 MG tablet    Sig: Take 1 tablet (25 mg total) by mouth 2 (two) times daily.    Dispense:  180 tablet    Refill:  3    Disposition: FU with Merwyn Hodapp C. Oval Linsey, MD, [facility] in 2 months  I,Mykaella Javier,acting as a scribe for Skeet Latch, MD.,have documented all relevant documentation on the behalf of Skeet Latch, MD,as directed by  Skeet Latch, MD while in the presence of Skeet Latch, MD.  I, [place] Oval Linsey, MD have reviewed all documentation for this visit.  The documentation of the exam, diagnosis, procedures, and orders on  06/13/2021 are all accurate and complete.   Signed, Skeet Latch, MD  06/13/2021 6:56 PM    [place]

## 2021-06-13 NOTE — Assessment & Plan Note (Addendum)
Matthew Sullivan is back in atrial fibrillation and symptomatic.  Rates are poorly controlled.  We will increase his metoprolol to 25 mg twice daily.  He has not missed any doses of Xarelto.  We will set him up for outpatient cardioversion on 12/15.  Given that he snores we will get a sleep study as well.

## 2021-06-13 NOTE — Assessment & Plan Note (Signed)
Blood pressures well have been controlled.  Continue hydrochlorothiazide.  Increase metoprolol to 25 mg twice daily.

## 2021-06-14 LAB — BASIC METABOLIC PANEL
BUN/Creatinine Ratio: 19 (ref 10–24)
BUN: 17 mg/dL (ref 8–27)
CO2: 28 mmol/L (ref 20–29)
Calcium: 9.2 mg/dL (ref 8.6–10.2)
Chloride: 99 mmol/L (ref 96–106)
Creatinine, Ser: 0.88 mg/dL (ref 0.76–1.27)
Glucose: 92 mg/dL (ref 70–99)
Potassium: 4 mmol/L (ref 3.5–5.2)
Sodium: 140 mmol/L (ref 134–144)
eGFR: 89 mL/min/{1.73_m2} (ref >59)

## 2021-06-14 LAB — CBC
Hematocrit: 40.7 % (ref 37.5–51.0)
Hemoglobin: 14.2 g/dL (ref 13.0–17.7)
MCH: 31.9 pg (ref 26.6–33.0)
MCHC: 34.9 g/dL (ref 31.5–35.7)
MCV: 92 fL (ref 79–97)
Platelets: 171 10*3/uL (ref 150–450)
RBC: 4.45 x10E6/uL (ref 4.14–5.80)
RDW: 13 % (ref 11.6–15.4)
WBC: 7.2 10*3/uL (ref 3.4–10.8)

## 2021-06-14 LAB — T4, FREE: Free T4: 1.24 ng/dL (ref 0.82–1.77)

## 2021-06-14 LAB — T3: T3, Total: 93 ng/dL (ref 71–180)

## 2021-06-14 LAB — MAGNESIUM: Magnesium: 2.2 mg/dL (ref 1.6–2.3)

## 2021-06-14 LAB — TSH: TSH: 3.81 u[IU]/mL (ref 0.450–4.500)

## 2021-06-14 NOTE — Addendum Note (Signed)
Addended by: Vennie Homans on: 06/14/2021 08:16 AM   Modules accepted: Orders

## 2021-06-21 ENCOUNTER — Encounter (HOSPITAL_COMMUNITY): Payer: Self-pay | Admitting: Cardiovascular Disease

## 2021-06-23 ENCOUNTER — Ambulatory Visit (HOSPITAL_COMMUNITY): Payer: Medicare HMO | Admitting: Anesthesiology

## 2021-06-23 ENCOUNTER — Encounter (HOSPITAL_COMMUNITY): Payer: Self-pay | Admitting: Cardiovascular Disease

## 2021-06-23 ENCOUNTER — Encounter (HOSPITAL_COMMUNITY)
Admission: RE | Disposition: A | Discharge status: Home / Self Care | Payer: Self-pay | Source: Home / Self Care | Attending: Cardiovascular Disease

## 2021-06-23 ENCOUNTER — Other Ambulatory Visit: Payer: Self-pay

## 2021-06-23 ENCOUNTER — Ambulatory Visit (HOSPITAL_COMMUNITY)
Admission: RE | Admit: 2021-06-23 | Discharge: 2021-06-23 | Disposition: A | Discharge status: Home / Self Care | Payer: Medicare HMO | Attending: Cardiovascular Disease | Admitting: Cardiovascular Disease

## 2021-06-23 DIAGNOSIS — E7800 Pure hypercholesterolemia, unspecified: Secondary | ICD-10-CM | POA: Diagnosis not present

## 2021-06-23 DIAGNOSIS — I480 Paroxysmal atrial fibrillation: Secondary | ICD-10-CM | POA: Diagnosis not present

## 2021-06-23 DIAGNOSIS — I10 Essential (primary) hypertension: Secondary | ICD-10-CM | POA: Diagnosis not present

## 2021-06-23 DIAGNOSIS — Z87891 Personal history of nicotine dependence: Secondary | ICD-10-CM | POA: Insufficient documentation

## 2021-06-23 DIAGNOSIS — I252 Old myocardial infarction: Secondary | ICD-10-CM | POA: Insufficient documentation

## 2021-06-23 DIAGNOSIS — Z79899 Other long term (current) drug therapy: Secondary | ICD-10-CM | POA: Diagnosis not present

## 2021-06-23 DIAGNOSIS — I4891 Unspecified atrial fibrillation: Secondary | ICD-10-CM | POA: Diagnosis not present

## 2021-06-23 DIAGNOSIS — I119 Hypertensive heart disease without heart failure: Secondary | ICD-10-CM | POA: Diagnosis not present

## 2021-06-23 DIAGNOSIS — Z85820 Personal history of malignant melanoma of skin: Secondary | ICD-10-CM | POA: Diagnosis not present

## 2021-06-23 HISTORY — PX: CARDIOVERSION: SHX1299

## 2021-06-23 SURGERY — CARDIOVERSION
Anesthesia: General

## 2021-06-23 MED ORDER — SODIUM CHLORIDE 0.9 % IV SOLN: INTRAVENOUS | Status: DC

## 2021-06-23 MED ORDER — LIDOCAINE 2% (20 MG/ML) 5 ML SYRINGE
INTRAMUSCULAR | Status: DC | PRN
  Administered 2021-06-23: 60 mg via INTRAVENOUS

## 2021-06-23 MED ORDER — PROPOFOL 10 MG/ML IV BOLUS
INTRAVENOUS | Status: DC | PRN
  Administered 2021-06-23: 50 mg via INTRAVENOUS

## 2021-06-23 NOTE — Anesthesia Procedure Notes (Signed)
Procedure Name: MAC Date/Time: 06/23/2021 8:12 AM Performed by: Moshe Salisbury, CRNA Pre-anesthesia Checklist: Patient identified, Emergency Drugs available, Suction available, Patient being monitored and Timeout performed Oxygen Delivery Method: Nasal cannula Placement Confirmation: positive ETCO2 Dental Injury: Teeth and Oropharynx as per pre-operative assessment

## 2021-06-23 NOTE — CV Procedure (Signed)
° ° °  Cardioversion Note  Matthew Sullivan [id number] 19-Jan-1946  Procedure: DC Cardioversion Indications: atrial fib   Procedure Details Consent: Obtained Time Out: Verified patient identification, verified procedure, site/side was marked, verified correct patient position, special equipment/implants available, Radiology Safety Procedures followed,  medications/allergies/relevent history reviewed, required imaging and test results available.  Performed  The patient has been on adequate anticoagulation.  The patient received IV Lidocaine 60 mg IV followed by Propofol 50 mg IV  for sedation.  Synchronous cardioversion was performed at 200  joules.  The cardioversion was successful.     Complications: No apparent complications Patient did tolerate procedure well.   Thayer Headings, Brooke Bonito., MD, [facility] 06/23/2021, 8:23 AM

## 2021-06-23 NOTE — Transfer of Care (Signed)
Immediate Anesthesia Transfer of Care Note  Patient: Matthew Sullivan  Procedure(s) Performed: CARDIOVERSION  Patient Location: PACU  Anesthesia Type:General  Level of Consciousness: drowsy and patient cooperative  Airway & Oxygen Therapy: Patient Spontanous Breathing and Patient connected to nasal cannula oxygen  Post-op Assessment: Report given to RN, Post -op Vital signs reviewed and stable and Patient moving all extremities  Post vital signs: Reviewed and stable  Last Vitals:  Vitals Value Taken Time  BP    Temp    Pulse    Resp    SpO2      Last Pain:  Vitals:   06/23/21 0720  TempSrc: Temporal  PainSc: 0-No pain         Complications: No notable events documented.

## 2021-06-23 NOTE — Interval H&P Note (Signed)
History and Physical Interval Note:  06/23/2021 8:07 AM  [name]  has presented today for surgery, with the diagnosis of AFIB.  The various methods of treatment have been discussed with the patient and family. After consideration of risks, benefits and other options for treatment, the patient has consented to  Procedure(s): CARDIOVERSION (N/A) as a surgical intervention.  The patient's history has been reviewed, patient examined, no change in status, stable for surgery.  I have reviewed the patient's chart and labs.  Questions were answered to the patient's satisfaction.     Matthew Sullivan

## 2021-06-23 NOTE — Anesthesia Preprocedure Evaluation (Addendum)
Anesthesia Evaluation  Patient identified by MRN, date of birth, ID band Patient awake    Reviewed: Allergy & Precautions, NPO status , Patient's Chart, lab work & pertinent test results, reviewed documented beta blocker date and time   Airway Mallampati: III  TM Distance: >3 FB Neck ROM: Full    Dental  (+) Teeth Intact, Dental Advisory Given   Pulmonary former smoker, PE   Pulmonary exam normal breath sounds clear to auscultation       Cardiovascular hypertension, Pt. on medications and Pt. on home beta blockers + Past MI  Normal cardiovascular exam+ dysrhythmias Atrial Fibrillation  Rhythm:Irregular Rate:Normal  TTE 2016 - Left ventricle: The cavity size was normal. Systolic function was  normal. The estimated ejection fraction was in the range of 50%  to 55%. Wall motion was normal; there were no regional wall  motion abnormalities. Left ventricular diastolic function  parameters were normal.  - Left atrium: The atrium was mildly dilated.  - Atrial septum: No defect or patent foramen ovale was identified.  - Pulmonary arteries: PA peak pressure: 35 mm Hg (S).    Neuro/Psych TIAnegative psych ROS   GI/Hepatic negative GI ROS, Neg liver ROS,   Endo/Other  negative endocrine ROS  Renal/GU negative Renal ROS  negative genitourinary   Musculoskeletal negative musculoskeletal ROS (+)   Abdominal   Peds  Hematology  (+) Blood dyscrasia (on xarelto), ,   Anesthesia Other Findings   Reproductive/Obstetrics                            Anesthesia Physical Anesthesia Plan  ASA: 3  Anesthesia Plan: General   Post-op Pain Management:    Induction: Intravenous  PONV Risk Score and Plan: 2 and Propofol infusion and Treatment may vary due to age or medical condition  Airway Management Planned: Natural Airway and Mask  Additional Equipment:   Intra-op Plan:   Post-operative Plan:    Informed Consent: I have reviewed the patients History and Physical, chart, labs and discussed the procedure including the risks, benefits and alternatives for the proposed anesthesia with the patient or authorized representative who has indicated his/her understanding and acceptance.     Dental advisory given  Plan Discussed with: CRNA  Anesthesia Plan Comments:         Anesthesia Quick Evaluation

## 2021-06-23 NOTE — Anesthesia Postprocedure Evaluation (Signed)
Anesthesia Post Note  Patient: Matthew Sullivan  Procedure(s) Performed: CARDIOVERSION     Patient location during evaluation: Endoscopy Anesthesia Type: General Level of consciousness: awake and alert Pain management: pain level controlled Vital Signs Assessment: post-procedure vital signs reviewed and stable Respiratory status: spontaneous breathing, nonlabored ventilation, respiratory function stable and patient connected to nasal cannula oxygen Cardiovascular status: blood pressure returned to baseline and stable Postop Assessment: no apparent nausea or vomiting Anesthetic complications: no   No notable events documented.  Last Vitals:  Vitals:   06/23/21 0842 06/23/21 0854  BP: 99/69 118/82  Pulse: 65 70  Resp: 14 (!) 21  Temp:    SpO2: 99% 98%    Last Pain:  Vitals:   06/23/21 0854  TempSrc:   PainSc: 0-No pain                 Jaylei Fuerte L Willistine Ferrall

## 2021-06-23 NOTE — Discharge Instructions (Signed)

## 2021-06-26 ENCOUNTER — Encounter (HOSPITAL_COMMUNITY): Payer: Self-pay | Admitting: Cardiovascular Disease

## 2021-06-27 ENCOUNTER — Other Ambulatory Visit: Payer: Self-pay

## 2021-06-27 ENCOUNTER — Ambulatory Visit (INDEPENDENT_AMBULATORY_CARE_PROVIDER_SITE_OTHER): Payer: Medicare HMO

## 2021-06-27 DIAGNOSIS — I480 Paroxysmal atrial fibrillation: Secondary | ICD-10-CM

## 2021-06-27 LAB — ECHOCARDIOGRAM COMPLETE
AR max vel: 2.91 cm2
AV Area VTI: 2.95 cm2
AV Area mean vel: 3.24 cm2
AV Mean grad: 2 mmHg
AV Peak grad: 5.6 mmHg
Ao pk vel: 1.18 m/s
Area-P 1/2: 6.48 cm2
Calc EF: 55.1 %
S' Lateral: 3.96 cm
Single Plane A2C EF: 59.7 %
Single Plane A4C EF: 53.2 %

## 2021-06-29 ENCOUNTER — Telehealth: Payer: Self-pay | Admitting: Cardiovascular Disease

## 2021-06-29 NOTE — Telephone Encounter (Signed)
Sent MYCHART message

## 2021-06-29 NOTE — Telephone Encounter (Signed)
Follow Message:   Please call patient about his Echo results. He saw it on My Chart, but did not understand.

## 2021-07-06 DIAGNOSIS — M25512 Pain in left shoulder: Secondary | ICD-10-CM | POA: Diagnosis not present

## 2021-07-12 ENCOUNTER — Telehealth: Payer: Self-pay

## 2021-07-12 ENCOUNTER — Encounter: Payer: Self-pay | Admitting: Family Medicine

## 2021-07-12 NOTE — Telephone Encounter (Signed)
Lmtcb to reschedule appt to a later time

## 2021-07-12 NOTE — Telephone Encounter (Signed)
Mychart sent to reschedule

## 2021-07-12 NOTE — Telephone Encounter (Signed)
Patient has appt scheduled on 11/15/21 at 9:00 am; needs to be rescheduled as Dr. Damita Dunnings does not start until 10:00 am on tuesdays.

## 2021-07-15 ENCOUNTER — Encounter: Payer: Self-pay | Admitting: Cardiology

## 2021-07-15 ENCOUNTER — Ambulatory Visit: Payer: Medicare HMO | Admitting: Cardiology

## 2021-07-15 ENCOUNTER — Other Ambulatory Visit: Payer: Self-pay

## 2021-07-15 VITALS — BP 150/72 | HR 61 | Ht 72.0 in | Wt 223.4 lb

## 2021-07-15 DIAGNOSIS — I480 Paroxysmal atrial fibrillation: Secondary | ICD-10-CM | POA: Diagnosis not present

## 2021-07-15 NOTE — Patient Instructions (Signed)
Medication Instructions:  Your physician recommends that you continue on your current medications as directed. Please refer to the Current Medication list given to you today.  *If you need a refill on your cardiac medications before your next appointment, please call your pharmacy*   Lab Work: None ordered   Testing/Procedures: None ordered   Follow-Up: At [facility], you and your health needs are our priority.  As part of our continuing mission to provide you with exceptional heart care, we have created designated Provider Care Teams.  These Care Teams include your primary Cardiologist (physician) and Advanced Practice Providers (APPs -  Physician Assistants and Nurse Practitioners) who all work together to provide you with the care you need, when you need it.  Your next appointment:   6 month(s)  The format for your next appointment:   In Person  Provider:   You will see one of the following Advanced Practice Providers on your designated Care Team:   Renee Ursuy, PA-C Michael "Andy" Tillery, PA-C   Thank you for choosing [facility]!!   Hadrian Yarbrough, RN [phone]         

## 2021-07-15 NOTE — Progress Notes (Signed)
Electrophysiology Office Note   Date:  07/15/2021   ID:  Juvon, Teater 12-02-1945, MRN [id number]  PCP:  Tonia Ghent, MD  Cardiologist:  [place] Primary Electrophysiologist:  Umaima Scholten Meredith Leeds, MD    Chief Complaint: AF   History of Present Illness: ARIA PICKRELL is a 76 y.o. male who is being seen today for the evaluation of AF at the request of Skeet Latch, MD. Presenting today for electrophysiology evaluation.  He has a history significant for hypertension, hyperlipidemia, atrial myxoma, atrial fibrillation, TIA.  His atrial myxoma was excised in 2013.  He had a catheterization at the time that showed no evidence of atrial fibrillation.  He had postoperative atrial fibrillation and had a cardioversion in 2014.  He was started on amiodarone which was subsequently discontinued.    He went back into atrial fibrillation at the end of November 2022.  He had symptoms of fatigue and palpitations.  He is now status post cardioversion 06/13/2021.  Since his cardioversion he has done well.  He has had a few short episodes of atrial fibrillation prior to his cardioversion but since then he has had no further episodes.   Today, he denies symptoms of palpitations, chest pain, shortness of breath, orthopnea, PND, lower extremity edema, claudication, dizziness, presyncope, syncope, bleeding, or neurologic sequela. The patient is tolerating medications without difficulties.    Past Medical History:  Diagnosis Date   Atrial fibrillation, currently in sinus rhythm    Atrial myxoma    Cervical osteoarthritis    History of TIA (transient ischemic attack)    History of tinnitus    Hyperlipidemia    Hypertension    Melanoma ([place])    per Dr. Allyson Sabal, local excision, no chemo/no rady tx.    Past Surgical History:  Procedure Laterality Date   ANKLE ARTHROSCOPY WITH ARTHRODESIS Left 08/26/2015   Procedure: LEFT ANKLE ARTHROSCOPY TIBIOTALAR ARTHRODESIS;  Surgeon: [name],  MD;  Location: [place];  Service: Orthopedics;  Laterality: Left;   APPENDECTOMY     BACK SURGERY     CARDIOVERSION N/A 06/25/2013   Procedure: CARDIOVERSION;  Surgeon: [name], MD;  Location: [facility] ENDOSCOPY;  Service: Cardiovascular;  Laterality: N/A;   CARDIOVERSION N/A 06/23/2021   Procedure: CARDIOVERSION;  Surgeon: Thayer Headings, MD;  Location: [facility] ENDOSCOPY;  Service: Cardiovascular;  Laterality: N/A;   EXCISION OF ATRIAL MYXOMA Left 04/09/2013   Procedure: EXCISION OF ATRIAL MYXOMA;  Surgeon: [name], MD;  Location: [place];  Service: Open Heart Surgery;  Laterality: Left;   INTRAOPERATIVE TRANSESOPHAGEAL ECHOCARDIOGRAM N/A 04/09/2013   Procedure: INTRAOPERATIVE TRANSESOPHAGEAL ECHOCARDIOGRAM;  Surgeon: [name], MD;  Location: [place];  Service: Open Heart Surgery;  Laterality: N/A;   LEFT HEART CATHETERIZATION WITH CORONARY ANGIOGRAM N/A 04/07/2013   Procedure: LEFT HEART CATHETERIZATION WITH CORONARY ANGIOGRAM;  Surgeon: [name], MD;  Location: [facility] CATH LAB;  Service: Cardiovascular;  Laterality: N/A;   NASAL SEPTUM SURGERY     NOSE SURGERY     ROTATOR CUFF REPAIR     both shoulders     Current Outpatient Medications  Medication Sig Dispense Refill   acetaminophen (TYLENOL) 500 MG tablet Take 1,000 mg by mouth every 6 (six) hours as needed for moderate pain.     atorvastatin (LIPITOR) 80 MG tablet Take 0.5 tablets (40 mg total) by mouth daily. 45 tablet 3   hydrochlorothiazide (HYDRODIURIL) 25 MG tablet TAKE 1 TABLET BY MOUTH DAILY. PLEASE MAKE  YEARLY APPT WITH DR. [place] ANYMORE REFILLS. 1ST ATTEMPT 90 tablet 1   metoprolol tartrate (LOPRESSOR) 25 MG tablet Take 1 tablet (25 mg total) by mouth 2 (two) times daily. 180 tablet 3   Multiple Vitamin (MULTIVITAMIN WITH MINERALS) TABS tablet Take 1 tablet by mouth daily.     potassium chloride SA (KLOR-CON) 20 MEQ tablet TAKE 1 TABLET BY MOUTH 2 TIMES DAILY. 180  tablet 3   saw palmetto 500 MG capsule Take 500 mg by mouth 2 (two) times daily.     XARELTO 20 MG TABS tablet TAKE 1 TABLET BY MOUTH DAILY WITH SUPPER. 30 tablet 5   No current facility-administered medications for this visit.    Allergies:   Patient has no known allergies.   Social History:  The patient  reports that he quit smoking about 44 years ago. His smoking use included cigarettes. He has never used smokeless tobacco. He reports current alcohol use. He reports that he does not use drugs.   Family History:  The patient's family history includes Brain cancer in his father; Cancer in his father; Colon cancer in his mother; Liver cancer in his mother.    ROS:  Please see the history of present illness.   Otherwise, review of systems is positive for none.   All other systems are reviewed and negative.    PHYSICAL EXAM: VS:  BP (!) 150/72    Pulse 61    Ht 6' (1.829 m)    Wt 223 lb 6.4 oz (101.3 kg)    SpO2 95%    BMI 30.30 kg/m  , BMI Body mass index is 30.3 kg/m. GEN: Well nourished, well developed, in no acute distress  HEENT: normal  Neck: no JVD, carotid bruits, or masses Cardiac: RRR; no murmurs, rubs, or gallops,no edema  Respiratory:  clear to auscultation bilaterally, normal work of breathing GI: soft, nontender, nondistended, + BS MS: no deformity or atrophy  Skin: warm and dry Neuro:  Strength and sensation are intact Psych: euthymic mood, full affect  EKG:  EKG is ordered today. Personal review of the ekg ordered shows sinus rhythm   Recent Labs: 10/29/2020: ALT 32 06/13/2021: BUN 17; Creatinine, Ser 0.88; Hemoglobin 14.2; Magnesium 2.2; Platelets 171; Potassium 4.0; Sodium 140; TSH 3.810    Lipid Panel     Component Value Date/Time   CHOL 152 10/29/2020 0853   CHOL 139 04/22/2019 1010   TRIG 141.0 10/29/2020 0853   HDL 42.30 10/29/2020 0853   HDL 44 04/22/2019 1010   CHOLHDL 4 10/29/2020 0853   VLDL 28.2 10/29/2020 0853   LDLCALC 82 10/29/2020 0853    LDLCALC 73 04/22/2019 1010     Wt Readings from Last 3 Encounters:  07/15/21 223 lb 6.4 oz (101.3 kg)  06/23/21 220 lb (99.8 kg)  06/13/21 221 lb (100.2 kg)      Other studies Reviewed: Additional studies/ records that were reviewed today include: TTE 06/27/21  Review of the above records today demonstrates:   1. Left ventricular ejection fraction, by estimation, is 50 to 55%. The  left ventricle has low normal function. The left ventricle demonstrates  global hypokinesis. The left ventricular internal cavity size was mildly  dilated. Left ventricular diastolic  parameters are indeterminate.   2. Right ventricular systolic function is normal. The right ventricular  size is normal.   3. Left atrial size was severely dilated.   4. Right atrial size was severely dilated.   5. The mitral  valve is grossly normal. Mild mitral valve regurgitation.  No evidence of mitral stenosis.   6. The aortic valve is normal in structure. Aortic valve regurgitation is  not visualized. No aortic stenosis is present.   7. Aortic dilatation noted. There is borderline dilatation of the  ascending aorta, measuring 38 mm.   ASSESSMENT AND PLAN:  1.  Paroxysmal atrial fibrillation: Fortunately he has back in normal rhythm.  His atrial fibrillation is nearing persistence likely as he required cardioversion.  Currently on Xarelto 20 mg daily.  CHA2DS2-VASc of 3.  We discussed further options including antiarrhythmics versus ablation.  As it has been many years since he has had an episode of atrial fibrillation he is opted to hold off on both of these.  If he has more frequent episodes of atrial fibrillation, we Toinette Lackie likely plan for ablation.    Current medicines are reviewed at length with the patient today.   The patient does not have concerns regarding his medicines.  The following changes were made today:  none  Labs/ tests ordered today include:  Orders Placed This Encounter  Procedures   EKG  12-Lead     Disposition:   FU with Delmont Prosch 6 months  Signed, Jocob Dambach Meredith Leeds, MD  07/15/2021 3:29 PM     [facility] [address] [place]  [address] [phone] (office) [phone] (fax)

## 2021-08-09 ENCOUNTER — Other Ambulatory Visit (HOSPITAL_BASED_OUTPATIENT_CLINIC_OR_DEPARTMENT_OTHER): Payer: Self-pay | Admitting: *Deleted

## 2021-08-09 DIAGNOSIS — I77810 Thoracic aortic ectasia: Secondary | ICD-10-CM

## 2021-08-10 ENCOUNTER — Other Ambulatory Visit: Payer: Self-pay | Admitting: Cardiovascular Disease

## 2021-09-02 ENCOUNTER — Encounter (HOSPITAL_BASED_OUTPATIENT_CLINIC_OR_DEPARTMENT_OTHER): Payer: Self-pay | Admitting: Cardiovascular Disease

## 2021-09-02 ENCOUNTER — Other Ambulatory Visit: Payer: Self-pay

## 2021-09-02 ENCOUNTER — Ambulatory Visit (HOSPITAL_BASED_OUTPATIENT_CLINIC_OR_DEPARTMENT_OTHER): Payer: Medicare HMO | Admitting: Cardiovascular Disease

## 2021-09-02 DIAGNOSIS — I10 Essential (primary) hypertension: Secondary | ICD-10-CM

## 2021-09-02 DIAGNOSIS — I480 Paroxysmal atrial fibrillation: Secondary | ICD-10-CM | POA: Diagnosis not present

## 2021-09-02 DIAGNOSIS — I2699 Other pulmonary embolism without acute cor pulmonale: Secondary | ICD-10-CM

## 2021-09-02 DIAGNOSIS — D151 Benign neoplasm of heart: Secondary | ICD-10-CM | POA: Diagnosis not present

## 2021-09-02 DIAGNOSIS — E7800 Pure hypercholesterolemia, unspecified: Secondary | ICD-10-CM | POA: Diagnosis not present

## 2021-09-02 DIAGNOSIS — I7121 Aneurysm of the ascending aorta, without rupture: Secondary | ICD-10-CM | POA: Diagnosis not present

## 2021-09-02 HISTORY — DX: Aneurysm of the ascending aorta, without rupture: I71.21

## 2021-09-02 NOTE — Progress Notes (Signed)
Cardiology Office Note:    Date:  09/02/2021   ID:  Matthew Sullivan, Matthew Sullivan Matthew Sullivan Matthew Sullivan, Matthew Sullivan, MRN [id number]  PCP:  Tonia Ghent, MD  Cardiologist:  Mertie Moores, MD    Referring MD: Tonia Ghent, MD   No chief complaint on file.   History of Present Illness:    Matthew Sullivan is a 76 y.o. male with a hx of hypertension, hyperlipidemia, atrial myxoma, atrial fibrillation, PE, and TIA who presents today follow up.  He was first seen in clinic 06/2021. His L atrial myxoma was removed in 2013. No CAD on cath. He has post-op atrial fibrillation and underwent cardioversion on 06/2013. He had a recurrent episode in 2017 and had DCCV.  He was well until recently when he developed recurrent palpitations. He was started on amiodarone which was subsequently discontinued.  On 06/06/21, he contacted Betha Loa, RN regarding  "thready pulse" and "chest pressure". He declined going to the ED though it was recommended if his symptoms worsened. His wife arranged an appointment for his palpitations.  At that first visit he was in atrial fibrillation and his rates were not well controlled.  Metoprolol was increased to 25 mg.  He had not missed any doses of his Xarelto and underwent cardioversion on 06/23/2021.  He had an echo 06/2021 that revealed LVEF 50 to 55% with indeterminate diastolic function.  He had mild mitral regurgitation and the ascending aorta was mildly dilated at 3.8 cm he was set up for sleep study but it has not yet been performed.  He saw Dr. Curt Bears on 07/2021 and discussed ablation but plan to continue monitoring for now.  Since his cardioversion he has done well.  He has remained in sinus rhythm.  He feels like he has less energy since the cardioversion but is certain that he has not been back in A-fib.  He has been very active.  He does not get formal exercise but has been building a deck and doing a lot of other strenuous outdoor work.  He denies any chest pain and his breathing is stable.   He has no lower extremity edema, orthopnea, or PND.  Past Medical History:  Diagnosis Date   Ascending aortic aneurysm 09/02/2021   Atrial fibrillation, currently in sinus rhythm    Atrial myxoma    Cervical osteoarthritis    History of TIA (transient ischemic attack)    History of tinnitus    Hyperlipidemia    Hypertension    Melanoma ([place])    per Dr. Allyson Sabal, local excision, no chemo/no rady tx.     Past Surgical History:  Procedure Laterality Date   ANKLE ARTHROSCOPY WITH ARTHRODESIS Left 08/26/2015   Procedure: LEFT ANKLE ARTHROSCOPY TIBIOTALAR ARTHRODESIS;  Surgeon: [name], MD;  Location: [place];  Service: Orthopedics;  Laterality: Left;   APPENDECTOMY     BACK SURGERY     CARDIOVERSION N/A 06/25/2013   Procedure: CARDIOVERSION;  Surgeon: [name], MD;  [facility]COPY;  Service: Cardiovascular;  Laterality: N/A;   CARDIOVERSION N/A 06/23/2021   Procedure: CARDIOVERSION;  Surgeon: Thayer Headings, MD;  Location: [facility] ENDOSCOPY;  Service: Cardiovascular;  Laterality: N/A;   EXCISION OF ATRIAL MYXOMA Left 04/09/2013   Procedure: EXCISION OF ATRIAL MYXOMA;  Surgeon: [name], MD;  Location: [place];  Service: Open Heart Surgery;  Laterality: Left;   INTRAOPERATIVE TRANSESOPHAGEAL ECHOCARDIOGRAM N/A 04/09/2013   Procedure: INTRAOPERATIVE TRANSESOPHAGEAL ECHOCARDIOGRAM;  Surgeon: [name], MD;  Location:  [facility];  Service: Open Heart Surgery;  Laterality: N/A;   LEFT HEART CATHETERIZATION WITH CORONARY ANGIOGRAM N/A 04/07/2013   Procedure: LEFT HEART CATHETERIZATION WITH CORONARY ANGIOGRAM;  Surgeon: [name], MD;  Location: [facility] CATH LAB;  Service: Cardiovascular;  Laterality: N/A;   NASAL SEPTUM SURGERY     NOSE SURGERY     ROTATOR CUFF REPAIR     both shoulders    Current Medications: Current Meds  Medication Sig   acetaminophen (TYLENOL) 500 MG tablet Take 1,000 mg by mouth every 6 (six) hours as needed for  moderate pain.   atorvastatin (LIPITOR) 80 MG tablet Take 0.5 tablets (40 mg total) by mouth daily.   hydrochlorothiazide (HYDRODIURIL) 25 MG tablet Take 1 tablet (25 mg total) by mouth daily.   metoprolol tartrate (LOPRESSOR) 25 MG tablet Take 1 tablet (25 mg total) by mouth 2 (two) times daily.   Multiple Vitamin (MULTIVITAMIN WITH MINERALS) TABS tablet Take 1 tablet by mouth daily.   potassium chloride SA (KLOR-CON) 20 MEQ tablet TAKE 1 TABLET BY MOUTH 2 TIMES DAILY.   saw palmetto 500 MG capsule Take 500 mg by mouth 2 (two) times daily.   XARELTO 20 MG TABS tablet TAKE 1 TABLET BY MOUTH DAILY WITH SUPPER.     Allergies:   Patient has no known allergies.   Social History   Socioeconomic History   Marital status: Married    Spouse name: Not on file   Number of children: Not on file   Years of education: Not on file   Highest education level: Not on file  Occupational History   Not on file  Tobacco Use   Smoking status: Former    Types: Cigarettes    Quit date: 07/10/1977    Years since quitting: 44.1   Smokeless tobacco: Never  Substance and Sexual Activity   Alcohol use: Yes    Comment: rare   Drug use: No   Sexual activity: Not on file  Other Topics Concern   Not on file  Social History Narrative   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   Social Determinants of Health   Financial Resource Strain: Not on file  Food Insecurity: Not on file  Transportation Needs: Not on file  Physical Activity: Not on file  Stress: Not on file  Social Connections: Not on file     Family History: The patient's family history includes Brain cancer in his father; Cancer in his father; Colon cancer in his mother; Liver cancer in his mother. There is no history of Prostate cancer.  ROS:   Please see the history of present illness.  (+) Palpitations (+) Chest pain (+) Snoring   (+) Bilateral shoulder pain All other systems reviewed and negative.   EKGs/Labs/Other Studies Reviewed:    The  following studies were reviewed today: CTA Chest 07/13/19 1. Small filling defect is seen in lower lobe branch of right pulmonary artery consistent with small pulmonary embolus. Critical Value/emergent results were called by telephone at the time of interpretation on 07/13/2019 at 3:21 pm to [place] , who verbally acknowledged these results. 2. Small cardiomegaly. 3. Large bilateral airspace opacities are noted concerning for multifocal pneumonia. 4. Aortic atherosclerosis. Aortic Atherosclerosis (ICD10-I70.0).  Echo 06/27/21:  1. Left ventricular ejection fraction, by estimation, is 50 to 55%. The  left ventricle has low normal function. The left ventricle demonstrates  global hypokinesis. The left ventricular internal cavity size was mildly  dilated. Left ventricular diastolic  parameters are  indeterminate.   2. Right ventricular systolic function is normal. The right ventricular  size is normal.   3. Left atrial size was severely dilated.   4. Right atrial size was severely dilated.   5. The mitral valve is grossly normal. Mild mitral valve regurgitation.  No evidence of mitral stenosis.   6. The aortic valve is normal in structure. Aortic valve regurgitation is  not visualized. No aortic stenosis is present.   7. Aortic dilatation noted. There is borderline dilatation of the  ascending aorta, measuring 38 mm.   EKG:   06/13/21: Afib, rate 115 bpm  Recent Labs: 10/29/2020: ALT 32 06/13/2021: BUN 17; Creatinine, Ser 0.88; Hemoglobin 14.2; Magnesium 2.2; Platelets 171; Potassium 4.0; Sodium 140; TSH 3.810   Recent Lipid Panel    Component Value Date/Time   CHOL 152 10/29/2020 0853   CHOL 139 04/22/2019 1010   TRIG 141.0 10/29/2020 0853   HDL 42.30 10/29/2020 0853   HDL 44 04/22/2019 1010   CHOLHDL 4 10/29/2020 0853   VLDL 28.2 10/29/2020 0853   LDLCALC 82 10/29/2020 0853   LDLCALC 73 04/22/2019 1010    CHA2DS2-VASc Score =   [ ] .  Therefore, the patient's  annual risk of stroke is   %.        Physical Exam:    VS:  BP 128/76 (BP Location: Left Arm, Patient Position: Sitting, Cuff Size: Normal)    Pulse 76    Ht 6' (1.829 m)    Wt 221 lb 8 oz (100.5 kg)    SpO2 97%    BMI 30.04 kg/m  , BMI Body mass index is 30.04 kg/m. GENERAL:  Well appearing HEENT: Pupils equal round and reactive, fundi not visualized, oral mucosa unremarkable NECK:  No jugular venous distention, waveform within normal limits, carotid upstroke brisk and symmetric, no bruits, no thyromegaly LUNGS:  Clear to auscultation bilaterally HEART:  Tachycardic.  Irregularly Irregular.  PMI not displaced or sustained,S1 and S2 within normal limits, no S3, no S4, no clicks, no rubs, no murmurs ABD:  Flat, positive bowel sounds normal in frequency in pitch, no bruits, no rebound, no guarding, no midline pulsatile mass, no hepatomegaly, no splenomegaly EXT:  2 plus pulses throughout, no edema, no cyanosis no clubbing SKIN:  No rashes no nodules NEURO:  Cranial nerves II through XII grossly intact, motor grossly intact throughout PSYCH:  Cognitively intact, oriented to person place and time  ASSESSMENT:    1. Other acute pulmonary embolism without acute cor pulmonale ([place])   2. PAF (paroxysmal atrial fibrillation) ([place])   3. Myxoma of heart   4. Pure hypercholesterolemia   5. Aneurysm of ascending aorta without rupture   6. Primary hypertension     PLAN:    Pulmonary embolism (HCC) Continue Xarelto.  PAF (paroxysmal atrial fibrillation) (HCC) Currently doing well.  He has maintained sinus rhythm since his cardioversion.  He was seen by EP and wants to hold off on getting an ablation for now.  Continue Xarelto and metoprolol.  He has not been interested in getting his sleep study.  Myxoma of heart He had a myxoma resection in 2013.  No recurrence noted on his echo last year.  Pure hypercholesterolemia Lipids are very well controlled on atorvastatin.  Ascending aortic  aneurysm 3.8 cm on echo 06/2021.  Plan to repeat 06/2022.  Blood pressure is well controlled and he is on a beta-blocker.  HTN (hypertension) Blood pressure is well controlled on hydrochlorothiazide and metoprolol.  In order of problems listed above:      Medication Adjustments/Labs and Tests Ordered: Current medicines are reviewed at length with the patient today.  Concerns regarding medicines are outlined above.  No orders of the defined types were placed in this encounter.   No orders of the defined types were placed in this encounter.   Disposition: FU with Faelynn Wynder C. Oval Linsey, MD, [facility] after echo 06/2022.   Signed, Skeet Latch, MD  09/02/2021 9:03 AM    [place]

## 2021-09-02 NOTE — Assessment & Plan Note (Signed)
Currently doing well.  He has maintained sinus rhythm since his cardioversion.  He was seen by EP and wants to hold off on getting an ablation for now.  Continue Xarelto and metoprolol.  He has not been interested in getting his sleep study.

## 2021-09-02 NOTE — Assessment & Plan Note (Addendum)
3.8 cm on echo 06/2021.  Plan to repeat 06/2022.  Blood pressure is well controlled and he is on a beta-blocker.

## 2021-09-02 NOTE — Assessment & Plan Note (Signed)
Continue Xarelto 

## 2021-09-02 NOTE — Patient Instructions (Signed)
Medication Instructions:  Your physician recommends that you continue on your current medications as directed. Please refer to the Current Medication list given to you today.   *If you need a refill on your cardiac medications before your next appointment, please call your pharmacy*  Lab Work: NONE  Testing/Procedures: Your physician has requested that you have an echocardiogram. Echocardiography is a painless test that uses sound waves to create images of your heart. It provides your doctor with information about the size and shape of your heart and how well your hearts chambers and valves are working. This procedure takes approximately one hour. There are no restrictions for this procedure.  IN December, SEE DR [facility] AFTER   Follow-Up: At [facility], you and your health needs are our priority.  As part of our continuing mission to provide you with exceptional heart care, we have created designated Provider Care Teams.  These Care Teams include your primary Cardiologist (physician) and Advanced Practice Providers (APPs -  Physician Assistants and Nurse Practitioners) who all work together to provide you with the care you need, when you need it.  We recommend signing up for the patient portal called "MyChart".  Sign up information is provided on this After Visit Summary.  MyChart is used to connect with patients for Virtual Visits (Telemedicine).  Patients are able to view lab/test results, encounter notes, upcoming appointments, etc.  Non-urgent messages can be sent to your provider as well.   To learn more about what you can do with MyChart, go to [email or web address].    Your next appointment:   10 month(s)  The format for your next appointment:   In Person  Provider:   Skeet Latch, MD{  Other Instructions  IF YOU DECIDE YOU WANT TO [place]

## 2021-09-02 NOTE — Assessment & Plan Note (Signed)
He had a myxoma resection in 2013.  No recurrence noted on his echo last year.

## 2021-09-02 NOTE — Assessment & Plan Note (Signed)
Lipids are very well-controlled on atorvastatin. 

## 2021-09-02 NOTE — Assessment & Plan Note (Signed)
Blood pressure is well controlled on hydrochlorothiazide and metoprolol.

## 2021-10-11 ENCOUNTER — Encounter: Payer: Self-pay | Admitting: Family Medicine

## 2021-10-11 DIAGNOSIS — Z85828 Personal history of other malignant neoplasm of skin: Secondary | ICD-10-CM | POA: Diagnosis not present

## 2021-10-11 DIAGNOSIS — Z08 Encounter for follow-up examination after completed treatment for malignant neoplasm: Secondary | ICD-10-CM | POA: Diagnosis not present

## 2021-10-11 DIAGNOSIS — Z85820 Personal history of malignant melanoma of skin: Secondary | ICD-10-CM | POA: Diagnosis not present

## 2021-10-11 DIAGNOSIS — D044 Carcinoma in situ of skin of scalp and neck: Secondary | ICD-10-CM | POA: Diagnosis not present

## 2021-10-11 DIAGNOSIS — D492 Neoplasm of unspecified behavior of bone, soft tissue, and skin: Secondary | ICD-10-CM | POA: Diagnosis not present

## 2021-10-11 DIAGNOSIS — C44622 Squamous cell carcinoma of skin of right upper limb, including shoulder: Secondary | ICD-10-CM | POA: Diagnosis not present

## 2021-10-11 DIAGNOSIS — L821 Other seborrheic keratosis: Secondary | ICD-10-CM | POA: Diagnosis not present

## 2021-10-11 DIAGNOSIS — L570 Actinic keratosis: Secondary | ICD-10-CM | POA: Diagnosis not present

## 2021-10-11 DIAGNOSIS — L814 Other melanin hyperpigmentation: Secondary | ICD-10-CM | POA: Diagnosis not present

## 2021-10-11 DIAGNOSIS — D225 Melanocytic nevi of trunk: Secondary | ICD-10-CM | POA: Diagnosis not present

## 2021-11-06 ENCOUNTER — Other Ambulatory Visit: Payer: Self-pay | Admitting: Family Medicine

## 2021-11-06 DIAGNOSIS — R7989 Other specified abnormal findings of blood chemistry: Secondary | ICD-10-CM

## 2021-11-06 DIAGNOSIS — I10 Essential (primary) hypertension: Secondary | ICD-10-CM

## 2021-11-06 DIAGNOSIS — E782 Mixed hyperlipidemia: Secondary | ICD-10-CM

## 2021-11-08 ENCOUNTER — Ambulatory Visit: Payer: Medicare HMO

## 2021-11-09 ENCOUNTER — Ambulatory Visit (INDEPENDENT_AMBULATORY_CARE_PROVIDER_SITE_OTHER): Payer: Medicare HMO

## 2021-11-09 ENCOUNTER — Telehealth: Payer: Self-pay

## 2021-11-09 ENCOUNTER — Other Ambulatory Visit (INDEPENDENT_AMBULATORY_CARE_PROVIDER_SITE_OTHER): Payer: Medicare HMO

## 2021-11-09 VITALS — Ht 72.0 in | Wt 221.0 lb

## 2021-11-09 DIAGNOSIS — I10 Essential (primary) hypertension: Secondary | ICD-10-CM

## 2021-11-09 DIAGNOSIS — R7989 Other specified abnormal findings of blood chemistry: Secondary | ICD-10-CM

## 2021-11-09 DIAGNOSIS — Z0000 Encounter for general adult medical examination without abnormal findings: Secondary | ICD-10-CM | POA: Diagnosis not present

## 2021-11-09 DIAGNOSIS — E782 Mixed hyperlipidemia: Secondary | ICD-10-CM

## 2021-11-09 LAB — CBC WITH DIFFERENTIAL/PLATELET
Basophils Absolute: 0 10*3/uL (ref 0.0–0.1)
Basophils Relative: 0.3 % (ref 0.0–3.0)
Eosinophils Absolute: 0.1 10*3/uL (ref 0.0–0.7)
Eosinophils Relative: 2.5 % (ref 0.0–5.0)
HCT: 41 % (ref 39.0–52.0)
Hemoglobin: 13.9 g/dL (ref 13.0–17.0)
Lymphocytes Relative: 29.5 % (ref 12.0–46.0)
Lymphs Abs: 1.8 10*3/uL (ref 0.7–4.0)
MCHC: 33.9 g/dL (ref 30.0–36.0)
MCV: 93 fl (ref 78.0–100.0)
Monocytes Absolute: 0.6 10*3/uL (ref 0.1–1.0)
Monocytes Relative: 10.7 % (ref 3.0–12.0)
Neutro Abs: 3.4 10*3/uL (ref 1.4–7.7)
Neutrophils Relative %: 57 % (ref 43.0–77.0)
Platelets: 186 10*3/uL (ref 150.0–400.0)
RBC: 4.41 Mil/uL (ref 4.22–5.81)
RDW: 14.4 % (ref 11.5–15.5)
WBC: 6 10*3/uL (ref 4.0–10.5)

## 2021-11-09 LAB — LIPID PANEL
Cholesterol: 139 mg/dL (ref 0–200)
HDL: 42.9 mg/dL (ref >39.00)
LDL Cholesterol: 68 mg/dL (ref 0–99)
NonHDL: 95.9
Total CHOL/HDL Ratio: 3
Triglycerides: 139 mg/dL (ref 0.0–149.0)
VLDL: 27.8 mg/dL (ref 0.0–40.0)

## 2021-11-09 LAB — COMPREHENSIVE METABOLIC PANEL
ALT: 38 U/L (ref 0–53)
AST: 23 U/L (ref 0–37)
Albumin: 4.4 g/dL (ref 3.5–5.2)
Alkaline Phosphatase: 65 U/L (ref 39–117)
BUN: 13 mg/dL (ref 6–23)
CO2: 33 mEq/L — ABNORMAL HIGH (ref 19–32)
Calcium: 8.8 mg/dL (ref 8.4–10.5)
Chloride: 100 mEq/L (ref 96–112)
Creatinine, Ser: 1 mg/dL (ref 0.40–1.50)
GFR: 72.65 mL/min (ref >60.00)
Glucose, Bld: 104 mg/dL — ABNORMAL HIGH (ref 70–99)
Potassium: 4.2 mEq/L (ref 3.5–5.1)
Sodium: 139 mEq/L (ref 135–145)
Total Bilirubin: 0.8 mg/dL (ref 0.2–1.2)
Total Protein: 6.5 g/dL (ref 6.0–8.3)

## 2021-11-09 LAB — TSH: TSH: 7.86 u[IU]/mL — ABNORMAL HIGH (ref 0.35–5.50)

## 2021-11-09 NOTE — Progress Notes (Signed)
? ?Subjective:  ? Matthew Sullivan is a 76 y.o. male who presents for Medicare Annual/Subsequent preventive examination. ?Virtual Visit via Telephone Note ? ?I connected with  [name] on 11/09/21 at  9:45 AM EDT by telephone and verified that I am speaking with the correct person using two identifiers. ? ?Location: ?Patient: home ?Provider: [facility] ?Persons participating in the virtual visit: patient/Nurse Health Advisor ?  ?I discussed the limitations, risks, security and privacy concerns of performing an evaluation and management service by telephone and the availability of in person appointments. The patient expressed understanding and agreed to proceed. ? ?Interactive audio and video telecommunications were attempted between this nurse and patient, however failed, due to patient having technical difficulties OR patient did not have access to video capability.  We continued and completed visit with audio only. ? ?Some vital signs may be absent or patient reported.  ? ?[name], LPN ? ?Review of Systems    ? ?Cardiac Risk Factors include: advanced age (>20mn, >>37women);hypertension;dyslipidemia;male gender ? ?   ?Objective:  ?  ?Today's Vitals  ? 11/09/21 0956  ?Weight: 221 lb (100.2 kg)  ?Height: 6' (1.829 m)  ? ?Body mass index is 29.97 kg/m?. ? ? ?  11/09/2021  ? 10:00 AM 06/23/2021  ?  7:23 AM 07/14/2019  ?  4:00 AM 12/04/2015  ? 12:49 PM 08/19/2015  ?  4:20 PM 06/25/2013  ? 11:44 AM 04/05/2013  ?  8:23 PM  ?Advanced Directives  ?Does Patient Have a Medical Advance Directive? No No No No No Patient does not have advance directive Patient does not have advance directive;Patient would not like information  ?Would patient like information on creating a medical advance directive? No - Patient declined No - Patient declined No - Patient declined      ?Pre-existing out of facility DNR order (yellow form or pink MOST form)      No No  ? ? ?Current Medications (verified) ?Outpatient Encounter Medications  as of 11/09/2021  ?Medication Sig  ? acetaminophen (TYLENOL) 500 MG tablet Take 1,000 mg by mouth every 6 (six) hours as needed for moderate pain.  ? atorvastatin (LIPITOR) 80 MG tablet Take 0.5 tablets (40 mg total) by mouth daily.  ? hydrochlorothiazide (HYDRODIURIL) 25 MG tablet Take 1 tablet (25 mg total) by mouth daily.  ? metoprolol tartrate (LOPRESSOR) 25 MG tablet Take 1 tablet (25 mg total) by mouth 2 (two) times daily.  ? Multiple Vitamin (MULTIVITAMIN WITH MINERALS) TABS tablet Take 1 tablet by mouth daily.  ? potassium chloride SA (KLOR-CON) 20 MEQ tablet TAKE 1 TABLET BY MOUTH 2 TIMES DAILY.  ? saw palmetto 500 MG capsule Take 500 mg by mouth 2 (two) times daily.  ? XARELTO 20 MG TABS tablet TAKE 1 TABLET BY MOUTH DAILY WITH SUPPER.  ? ?No facility-administered encounter medications on file as of 11/09/2021.  ? ? ?Allergies (verified) ?Patient has no known allergies.  ? ?History: ?Past Medical History:  ?Diagnosis Date  ? Ascending aortic aneurysm 09/02/2021  ? Atrial fibrillation, currently in sinus rhythm   ? Atrial myxoma   ? Cervical osteoarthritis   ? History of TIA (transient ischemic attack)   ? History of tinnitus   ? Hyperlipidemia   ? Hypertension   ? Melanoma ([facility]   ? per Dr. LAllyson Sabal local excision, no chemo/no rady tx.   ? ?Past Surgical History:  ?Procedure Laterality Date  ? ANKLE ARTHROSCOPY WITH ARTHRODESIS Left 08/26/2015  ? Procedure:  LEFT ANKLE ARTHROSCOPY TIBIOTALAR ARTHRODESIS;  Surgeon: [name], MD;  Location: [place];  Service: Orthopedics;  Laterality: Left;  ? APPENDECTOMY    ? BACK SURGERY    ? CARDIOVERSION N/A 06/25/2013  ? Procedure: CARDIOVERSION;  Surgeon: [name], MD;  Location: [place];  Service: Cardiovascular;  Laterality: N/A;  ? CARDIOVERSION N/A 06/23/2021  ? Procedure: CARDIOVERSION;  Surgeon: [name], MD;  Location: [place];  Service: Cardiovascular;  Laterality: N/A;  ? EXCISION OF ATRIAL MYXOMA Left 04/09/2013  ?  Procedure: EXCISION OF ATRIAL MYXOMA;  Surgeon: [name], MD;  Location: [place];  Service: Open Heart Surgery;  Laterality: Left;  ? INTRAOPERATIVE TRANSESOPHAGEAL ECHOCARDIOGRAM N/A 04/09/2013  ? Procedure: INTRAOPERATIVE TRANSESOPHAGEAL ECHOCARDIOGRAM;  Surgeon: [name], MD;  Location: [place];  Service: Open Heart Surgery;  Laterality: N/A;  ? LEFT HEART CATHETERIZATION WITH CORONARY ANGIOGRAM N/A 04/07/2013  ? Procedure: LEFT HEART CATHETERIZATION WITH CORONARY ANGIOGRAM;  Surgeon: [name], MD;  Location[facility] C[facility] LAB;  Service: Cardiovascular;  Laterality: N/A;  ? NASAL SEPTUM SURGERY    ? NOSE SURGERY    ? ROTATOR CUFF REPAIR    ? both shoulders  ? ?Family History  ?Problem Relation Age of Onset  ? Colon cancer Mother   ? Liver cancer Mother   ? Cancer Father   ? Brain cancer Father   ?     glioblastoma  ? Prostate cancer Neg Hx   ? ?Social History  ? ?Socioeconomic History  ? Marital status: Married  ?  Spouse name: Not on file  ? Number of children: Not on file  ? Years of education: Not on file  ? Highest education level: Not on file  ?Occupational History  ? Not on file  ?Tobacco Use  ? Smoking status: Former  ?  Types: Cigarettes  ?  Quit date: 07/10/1977  ?  Years since quitting: 44.3  ? Smokeless tobacco: Never  ?Substance and Sexual Activity  ? Alcohol use: Yes  ?  Comment: rare  ? Drug use: No  ? Sexual activity: Not on file  ?Other Topics Concern  ? Not on file  ?Social History Narrative  ? 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 ?Social Determinants of Health  ? ?Financial Resource Strain: Low Risk   ? Difficulty of Paying Living Expenses: Not hard at all  ?Food Insecurity: No Food Insecurity  ? Worried About Charity fundraiser in the Last Year: Never true  ? Ran Out of Food in the Last Year: Never true  ?Transportation Needs: No Transportation Needs  ? Lack of Transportation (Medical): No  ? Lack of Transportation (Non-Medical): No  ?Physical Activity: Sufficiently Active  ? Days  of Exercise per Week: 5 days  ? Minutes of Exercise per Session: 30 min  ?Stress: No Stress Concern Present  ? Feeling of Stress : Not at all  ?Social Connections: Socially Integrated  ? Frequency of Communication with Friends and Family: More than three times a week  ? Frequency of Social Gatherings with Friends and Family: More than three times a week  ? Attends Religious Services: More than 4 times per year  ? Active Member of Clubs or Organizations: Yes  ? Attends Archivist Meetings: More than 4 times per year  ? Marital Status: Married  ? ? ?Tobacco Counseling ?Counseling given: Not Answered ? ? ?Clinical Intake: ? ?Pre-visit preparation completed: Yes ? ?Pain : No/denies pain ? ?  ? ?  BMI - recorded: 29.97 ?Nutritional Status: BMI 25 -29 Overweight ?Nutritional Risks: None ?Diabetes: No ? ?How often do you need to have someone help you when you read instructions, pamphlets, or other written materials from your doctor or pharmacy?: 1 - Never ? ?Diabetic?no ? ?Interpreter Needed?: No ? ?Information entered by :: mj Asja Frommer,lpn ? ? ?Activities of Daily Living ? ?  11/09/2021  ? 10:01 AM  ?In your present state of health, do you have any difficulty performing the following activities:  ?Hearing? 0  ?Vision? 0  ?Difficulty concentrating or making decisions? 0  ?Walking or climbing stairs? 0  ?Dressing or bathing? 0  ?Doing errands, shopping? 0  ?Preparing Food and eating ? N  ?Using the Toilet? N  ?In the past six months, have you accidently leaked urine? N  ?Do you have problems with loss of bowel control? N  ?Managing your Medications? N  ?Managing your Finances? N  ?Housekeeping or managing your Housekeeping? N  ? ? ?Patient Care Team: ?Tonia Ghent, MD as PCP - General (Family Medicine) ?Nahser, Wonda Cheng, MD as PCP - Cardiology (Cardiology) ? ?Indicate any recent Medical Services you may have received from other than [facility] providers in the past year (date may be approximate). ? ?   ?Assessment:  ?  This is a routine wellness examination for Raynaldo. ? ?Hearing/Vision screen ?Hearing Screening - Comments:: No hearing issues.  ?Vision Screening - Comments:: Glasses. [facility] on [address]. 10/2021. ? ?[place]

## 2021-11-09 NOTE — Patient Instructions (Signed)
Matthew Sullivan , ?Thank you for taking time to come for your Medicare Wellness Visit. I appreciate your ongoing commitment to your health goals. Please review the following plan we discussed and let me know if I can assist you in the future.  ? ?Screening recommendations/referrals: ?Colonoscopy: Cologuard done 11/30/2020. No longer required.  ? ?Recommended yearly ophthalmology/optometry visit for glaucoma screening and checkup ?Recommended yearly dental visit for hygiene and checkup ? ?Vaccinations: ?Influenza vaccine: Done 06/10/2021 Repeat annually ? ?Pneumococcal vaccine: Done 07/20/2017 and 11/05/2020. ?Tdap vaccine: Due. Repeat in 10 years ? ?Shingles vaccine: Discussed.    ?Covid-19: Discussed.  ? ?Advanced directives: Please bring a copy of your health care power of attorney and living will to the office to be added to your chart at your convenience. ? ? ?Conditions/risks identified: KEEP UP THE GOOD WORK!! ? ?Next appointment: Follow up in one year for your annual wellness visit. 11/13/2022 @ 9:45 AM.  ? ?Preventive Care 56 Years and Older, Male ? ?Preventive care refers to lifestyle choices and visits with your health care provider that can promote health and wellness. ?What does preventive care include? ?A yearly physical exam. This is also called an annual well check. ?Dental exams once or twice a year. ?Routine eye exams. Ask your health care provider how often you should have your eyes checked. ?Personal lifestyle choices, including: ?Daily care of your teeth and gums. ?Regular physical activity. ?Eating a healthy diet. ?Avoiding tobacco and drug use. ?Limiting alcohol use. ?Practicing safe sex. ?Taking low doses of aspirin every day. ?Taking vitamin and mineral supplements as recommended by your health care provider. ?What happens during an annual well check? ?The services and screenings done by your health care provider during your annual well check will depend on your age, overall health, lifestyle risk  factors, and family history of disease. ?Counseling  ?Your health care provider may ask you questions about your: ?Alcohol use. ?Tobacco use. ?Drug use. ?Emotional well-being. ?Home and relationship well-being. ?Sexual activity. ?Eating habits. ?History of falls. ?Memory and ability to understand (cognition). ?Work and work Statistician. ?Screening  ?You may have the following tests or measurements: ?Height, weight, and BMI. ?Blood pressure. ?Lipid and cholesterol levels. These may be checked every 5 years, or more frequently if you are over 86 years old. ?Skin check. ?Lung cancer screening. You may have this screening every year starting at age 19 if you have a 30-pack-year history of smoking and currently smoke or have quit within the past 15 years. ?Fecal occult blood test (FOBT) of the stool. You may have this test every year starting at age 17. ?Flexible sigmoidoscopy or colonoscopy. You may have a sigmoidoscopy every 5 years or a colonoscopy every 10 years starting at age 48. ?Prostate cancer screening. Recommendations will vary depending on your family history and other risks. ?Hepatitis C blood test. ?Hepatitis B blood test. ?Sexually transmitted disease (STD) testing. ?Diabetes screening. This is done by checking your blood sugar (glucose) after you have not eaten for a while (fasting). You may have this done every 1-3 years. ?Abdominal aortic aneurysm (AAA) screening. You may need this if you are a current or former smoker. ?Osteoporosis. You may be screened starting at age 69 if you are at high risk. ?Talk with your health care provider about your test results, treatment options, and if necessary, the need for more tests. ?Vaccines  ?Your health care provider may recommend certain vaccines, such as: ?Influenza vaccine. This is recommended every year. ?Tetanus, diphtheria, and acellular pertussis (  Tdap, Td) vaccine. You may need a Td booster every 10 years. ?Zoster vaccine. You may need this after age  68. ?Pneumococcal 13-valent conjugate (PCV13) vaccine. One dose is recommended after age 8. ?Pneumococcal polysaccharide (PPSV23) vaccine. One dose is recommended after age 30. ?Talk to your health care provider about which screenings and vaccines you need and how often you need them. ?This information is not intended to replace advice given to you by your health care provider. Make sure you discuss any questions you have with your health care provider. ?Document Released: 07/23/2015 Document Revised: 03/15/2016 Document Reviewed: 04/27/2015 ?Elsevier Interactive Patient Education ? 2017 [place]. ? ?Fall Prevention in the Home ?Falls can cause injuries. They can happen to people of all ages. There are many things you can do to make your home safe and to help prevent falls. ?What can I do on the outside of my home? ?Regularly fix the edges of walkways and driveways and fix any cracks. ?Remove anything that might make you trip as you walk through a door, such as a raised step or threshold. ?Trim any bushes or trees on the path to your home. ?Use bright outdoor lighting. ?Clear any walking paths of anything that might make someone trip, such as rocks or tools. ?Regularly check to see if handrails are loose or broken. Make sure that both sides of any steps have handrails. ?Any raised decks and porches should have guardrails on the edges. ?Have any leaves, snow, or ice cleared regularly. ?Use sand or salt on walking paths during winter. ?Clean up any spills in your garage right away. This includes oil or grease spills. ?What can I do in the bathroom? ?Use night lights. ?Install grab bars by the toilet and in the tub and shower. Do not use towel bars as grab bars. ?Use non-skid mats or decals in the tub or shower. ?If you need to sit down in the shower, use a plastic, non-slip stool. ?Keep the floor dry. Clean up any water that spills on the floor as soon as it happens. ?Remove soap buildup in the tub or shower  regularly. ?Attach bath mats securely with double-sided non-slip rug tape. ?Do not have throw rugs and other things on the floor that can make you trip. ?What can I do in the bedroom? ?Use night lights. ?Make sure that you have a light by your bed that is easy to reach. ?Do not use any sheets or blankets that are too big for your bed. They should not hang down onto the floor. ?Have a firm chair that has side arms. You can use this for support while you get dressed. ?Do not have throw rugs and other things on the floor that can make you trip. ?What can I do in the kitchen? ?Clean up any spills right away. ?Avoid walking on wet floors. ?Keep items that you use a lot in easy-to-reach places. ?If you need to reach something above you, use a strong step stool that has a grab bar. ?Keep electrical cords out of the way. ?Do not use floor polish or wax that makes floors slippery. If you must use wax, use non-skid floor wax. ?Do not have throw rugs and other things on the floor that can make you trip. ?What can I do with my stairs? ?Do not leave any items on the stairs. ?Make sure that there are handrails on both sides of the stairs and use them. Fix handrails that are broken or loose. Make sure that handrails are  as long as the stairways. ?Check any carpeting to make sure that it is firmly attached to the stairs. Fix any carpet that is loose or worn. ?Avoid having throw rugs at the top or bottom of the stairs. If you do have throw rugs, attach them to the floor with carpet tape. ?Make sure that you have a light switch at the top of the stairs and the bottom of the stairs. If you do not have them, ask someone to add them for you. ?What else can I do to help prevent falls? ?Wear shoes that: ?Do not have high heels. ?Have rubber bottoms. ?Are comfortable and fit you well. ?Are closed at the toe. Do not wear sandals. ?If you use a stepladder: ?Make sure that it is fully opened. Do not climb a closed stepladder. ?Make sure that  both sides of the stepladder are locked into place. ?Ask someone to hold it for you, if possible. ?Clearly mark and make sure that you can see: ?Any grab bars or handrails. ?First and last steps. ?Where the edge of

## 2021-11-09 NOTE — Telephone Encounter (Signed)
[place] Night - Client ?Nonclinical Telephone Record  ?AccessNurse? ?Client [facility] [place] Night - Client ?Client Site [place] ?Provider Renford Dills - MD ?Contact Type Call ?Who Is Calling Patient / Member / Family / Caregiver ?Caller Name Yoan Sallade ?Caller Phone Number [phone] ?Patient Name Matthew Sullivan ?Patient DOB 21-Aug-1945 ?Call Type Message Only Information Provided ?Reason for Call Request for General Office Information ?Initial Comment Caller states he has an appointment for lab 11/09/2021 and wants to do the visit at that time ?845am. ?Additional Comment Office hours provided. ?Disp. Time Disposition Final User ?11/08/2021 5:23:31 PM General Information Provided Yes Hubbard Hartshorn ?Call Closed By: Hubbard Hartshorn ?Transaction Date/Time: 11/08/2021 5:19:45 PM (ET ?

## 2021-11-09 NOTE — Telephone Encounter (Signed)
I spoke with pt to clarify access message and pt wanted to change lab appt from 8:30 to 8:45 and pt has already had lab test done and nothing further needed at this time. ?

## 2021-11-15 ENCOUNTER — Encounter: Payer: Medicare HMO | Admitting: Family Medicine

## 2021-11-15 ENCOUNTER — Ambulatory Visit (INDEPENDENT_AMBULATORY_CARE_PROVIDER_SITE_OTHER): Payer: Medicare HMO | Admitting: Family Medicine

## 2021-11-15 ENCOUNTER — Encounter: Payer: Self-pay | Admitting: Family Medicine

## 2021-11-15 VITALS — BP 124/68 | HR 68 | Temp 97.1°F | Ht 72.0 in | Wt 223.0 lb

## 2021-11-15 DIAGNOSIS — E039 Hypothyroidism, unspecified: Secondary | ICD-10-CM

## 2021-11-15 DIAGNOSIS — I10 Essential (primary) hypertension: Secondary | ICD-10-CM

## 2021-11-15 DIAGNOSIS — E782 Mixed hyperlipidemia: Secondary | ICD-10-CM | POA: Diagnosis not present

## 2021-11-15 DIAGNOSIS — I480 Paroxysmal atrial fibrillation: Secondary | ICD-10-CM

## 2021-11-15 DIAGNOSIS — Z7189 Other specified counseling: Secondary | ICD-10-CM | POA: Diagnosis not present

## 2021-11-15 DIAGNOSIS — Z0000 Encounter for general adult medical examination without abnormal findings: Secondary | ICD-10-CM

## 2021-11-15 MED ORDER — LEVOTHYROXINE SODIUM 25 MCG PO TABS: 25.0000 ug | ORAL_TABLET | Freq: Every day | ORAL | 3 refills | Status: DC

## 2021-11-15 NOTE — Progress Notes (Signed)
Abnormal TSH.  No thyroid rx currently. D/w pt about options.   Fatigue noted.   ? ?Elevated Cholesterol: ?Using medications without problems: yes ?Muscle aches: some cramping in the legs at night.  Not exertional. Not having diffuse aches.  Mustard helps.  Not every night.  Noted more after a day of hard work.  ?Diet compliance: yes ?Exercise: yes ? ?Hypertension:    ?Using medication without problems or lightheadedness: yes ?Chest pain with exertion:no ?Edema:no ?Short of breath:no ? ?H/o AF.  Still on anticoagulation.  No bleeding.  No blood in stool, etc.   ? ?H/o melanoma per derm.   ? ?Ascending aorta is mildly dilated, followed by cards.  I will defer.  He agrees. ? ?Flu prev done ?Shingles discussed with patient. ?PNA 2022 ?Tetanus discussed with patient.  May be cheaper pharmacy ?COVID-vaccine encouraged. ?Cologuard neg 2022, d/w pt.   ?Prostate cancer screening and PSA options (with potential risks and benefits of testing vs not testing) were discussed along with recent recs/guidelines.  He declined testing PSA at this point. ?Advance directive-wife designated if patient were incapacitated. ? ?PMH and SH reviewed ? ?Meds, vitals, and allergies reviewed.  ? ?ROS: Per HPI unless specifically indicated in ROS section  ? ?GEN: nad, alert and oriented ?HEENT: ncat ?NECK: supple w/o LA ?CV: rrr. ?PULM: ctab, no inc wob ?ABD: soft, +bs ?EXT: no edema ?SKIN: no acute rash ?

## 2021-11-15 NOTE — Patient Instructions (Signed)
Levothyroxine in the AM on empty stomach separate from other meds.   ?Recheck TSH in about 2-3 months.  Nonfasting labs.  ?Take care.  Glad to see you. ?

## 2021-11-16 DIAGNOSIS — E039 Hypothyroidism, unspecified: Secondary | ICD-10-CM | POA: Insufficient documentation

## 2021-11-16 DIAGNOSIS — Z0000 Encounter for general adult medical examination without abnormal findings: Secondary | ICD-10-CM | POA: Insufficient documentation

## 2021-11-16 NOTE — Assessment & Plan Note (Signed)
Advance directive- wife designated if patient were incapacitated.  

## 2021-11-16 NOTE — Assessment & Plan Note (Signed)
History of. Still on anticoagulation.  No bleeding.  No blood in stool, etc. would continue atorvastatin hydrochlorothiazide metoprolol potassium and Xarelto. ?

## 2021-11-16 NOTE — Assessment & Plan Note (Signed)
Flu prev done ?Shingles discussed with patient. ?PNA 2022 ?Tetanus discussed with patient.  May be cheaper pharmacy ?COVID-vaccine encouraged. ?Cologuard neg 2022, d/w pt.   ?Prostate cancer screening and PSA options (with potential risks and benefits of testing vs not testing) were discussed along with recent recs/guidelines.  He declined testing PSA at this point. ?Advance directive-wife designated if patient were incapacitated. ?

## 2021-11-16 NOTE — Assessment & Plan Note (Signed)
would continue atorvastatin hydrochlorothiazide metoprolol potassium ?

## 2021-11-16 NOTE — Assessment & Plan Note (Signed)
Start levothyroxine 25 mcg daily with routine dosing instructions, on empty stomach, early in the morning.  Recheck TSH in about 2-3 months.  Routine cautions given to patient.  Hypothyroidism pathophysiology discussed with patient. ?

## 2021-12-09 ENCOUNTER — Other Ambulatory Visit: Payer: Self-pay | Admitting: Cardiovascular Disease

## 2021-12-09 DIAGNOSIS — I480 Paroxysmal atrial fibrillation: Secondary | ICD-10-CM

## 2021-12-09 NOTE — Telephone Encounter (Signed)
Prescription refill request for Xarelto received.  Indication: PE/Afib  Last office visit: 09/02/21 Oval Linsey)  Weight: 101.2kg Age: 76 Scr: 1.00 (11/09/21 CrCl: 88.33m/min  Appropriate dose and refill sent to requested pharmacy.

## 2022-01-30 ENCOUNTER — Other Ambulatory Visit: Payer: Medicare HMO

## 2022-02-06 DIAGNOSIS — H9113 Presbycusis, bilateral: Secondary | ICD-10-CM | POA: Insufficient documentation

## 2022-02-06 DIAGNOSIS — H9120 Sudden idiopathic hearing loss, unspecified ear: Secondary | ICD-10-CM | POA: Insufficient documentation

## 2022-02-06 DIAGNOSIS — H903 Sensorineural hearing loss, bilateral: Secondary | ICD-10-CM | POA: Diagnosis not present

## 2022-03-14 ENCOUNTER — Telehealth: Payer: Self-pay | Admitting: Cardiovascular Disease

## 2022-03-14 NOTE — Telephone Encounter (Addendum)
Spoke with patient regarding Afib HR in 80's No energy when up moving around  Scheduled visit with Dr Oval Linsey tomorrow morning Patient aware of date, time, and location

## 2022-03-14 NOTE — Progress Notes (Signed)
Cardiology Office Note:    Date:  03/15/2022   ID:  Matthew Sullivan, DOB 1946-07-04, MRN [id number]  PCP:  Tonia Ghent, MD  Cardiologist:  Mertie Moores, MD    Referring MD: Tonia Ghent, MD   No chief complaint on file.   History of Present Illness:    Matthew Sullivan is a 76 y.o. male with a hx of hypertension, hyperlipidemia, atrial myxoma, atrial fibrillation, PE, and TIA, who presents today follow up.  He was first seen in clinic 06/2021. His L atrial myxoma was removed in 2013. No CAD on cath. He has post-op atrial fibrillation and underwent cardioversion on 06/2013. He had a recurrent episode in 2017 and had DCCV. At his first visit he was in atrial fibrillation and his rates were not well controlled.  Metoprolol was increased to 25 mg.  He had not missed any doses of his Xarelto and underwent cardioversion on 06/23/2021.  He had an echo 06/2021 that revealed LVEF 50 to 55% with indeterminate diastolic function.  He had mild mitral regurgitation and the ascending aorta was mildly dilated at 3.8 cm he was set up for sleep study but it has not yet been performed.  He saw Dr. Curt Bears on 07/2021 and discussed ablation but plan to continue monitoring for now.  Today, he is accompanied by a family member. He reports that he went back into Afib last Friday at 7:00. He is feeling lethargic, and he has also been experiencing atypical abdominal discomfort. He denies any chest pain or pressure. He notes this is the first time his cardioversion only lasted for 9 months (previously up to 4 years). At home his heart rate has been ranging 90-98 bpm. Also he indicates a large ecchymosis of his RLE due to a muscle cramp. Of note he has a planned molar extraction soon. He denies any shortness of breath, or peripheral edema. No lightheadedness, headaches, syncope, orthopnea, or PND.   Past Medical History:  Diagnosis Date   Ascending aortic aneurysm ([place]) 09/02/2021   Atrial fibrillation, currently  in sinus rhythm    Atrial myxoma    Cervical osteoarthritis    History of TIA (transient ischemic attack)    History of tinnitus    Hyperlipidemia    Hypertension    Melanoma ([place])    per Dr. Allyson Sabal, local excision, no chemo/no rady tx.     Past Surgical History:  Procedure Laterality Date   ANKLE ARTHROSCOPY WITH ARTHRODESIS Left 08/26/2015   Procedure: LEFT ANKLE ARTHROSCOPY TIBIOTALAR ARTHRODESIS;  Surgeon: [name], MD;  Location: [place];  Service: Orthopedics;  Laterality: Left;   APPENDECTOMY     BACK SURGERY     CARDIOVERSION N/A 06/25/2013   Procedure: CARDIOVERSION;  Surgeon: [name], MD;  Location: [facility] ENDOSCOPY;  Service: Cardiovascular;  Laterality: N/A;   CARDIOVERSION N/A 06/23/2021   Procedure: CARDIOVERSION;  Surgeon: Thayer Headings, MD;  Location: [facility] ENDOSCOPY;  Service: Cardiovascular;  Laterality: N/A;   EXCISION OF ATRIAL MYXOMA Left 04/09/2013   Procedure: EXCISION OF ATRIAL MYXOMA;  Surgeon: [name], MD;  Location: [place];  Service: Open Heart Surgery;  Laterality: Left;   INTRAOPERATIVE TRANSESOPHAGEAL ECHOCARDIOGRAM N/A 04/09/2013   Procedure: INTRAOPERATIVE TRANSESOPHAGEAL ECHOCARDIOGRAM;  Surgeon: [name], MD;  Location: [place];  Service: Open Heart Surgery;  Laterality: N/A;   LEFT HEART CATHETERIZATION WITH CORONARY ANGIOGRAM N/A 04/07/2013   Procedure: LEFT HEART CATHETERIZATION WITH CORONARY ANGIOGRAM;  Surgeon: Juanda Bond  Burt Knack, MD;  Location: [facility] CATH LAB;  Service: Cardiovascular;  Laterality: N/A;   NASAL SEPTUM SURGERY     NOSE SURGERY     ROTATOR CUFF REPAIR     both shoulders    Current Medications: Current Meds  Medication Sig   acetaminophen (TYLENOL) 500 MG tablet Take 1,000 mg by mouth every 6 (six) hours as needed for moderate pain.   atorvastatin (LIPITOR) 80 MG tablet Take 0.5 tablets (40 mg total) by mouth daily.   hydrochlorothiazide (HYDRODIURIL) 25 MG tablet Take 1 tablet (25  mg total) by mouth daily.   levothyroxine (SYNTHROID) 25 MCG tablet Take 1 tablet (25 mcg total) by mouth daily.   metoprolol tartrate (LOPRESSOR) 25 MG tablet Take 1 tablet (25 mg total) by mouth 2 (two) times daily.   Multiple Vitamin (MULTIVITAMIN WITH MINERALS) TABS tablet Take 1 tablet by mouth daily.   potassium chloride SA (KLOR-CON) 20 MEQ tablet TAKE 1 TABLET BY MOUTH 2 TIMES DAILY.   saw palmetto 500 MG capsule Take 500 mg by mouth 2 (two) times daily.   XARELTO 20 MG TABS tablet TAKE 1 TABLET BY MOUTH DAILY WITH SUPPER.     Allergies:   Patient has no known allergies.   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   Social Determinants of Health   Financial Resource Strain: Low Risk  (11/09/2021)   Overall Financial Resource Strain (CARDIA)    Difficulty of Paying Living Expenses: Not hard at all  Food Insecurity: No Food Insecurity (11/09/2021)   Hunger Vital Sign    Worried About Running Out of Food in the Last Year: Never true    [place] in the Last Year: Never true  Transportation Needs: No Transportation Needs (11/09/2021)   PRAPARE - Hydrologist (Medical): No    Lack of Transportation (Non-Medical): No  Physical Activity: Sufficiently Active (11/09/2021)   Exercise Vital Sign    Days of Exercise per Week: 5 days    Minutes of Exercise per Session: 30 min  Stress: No Stress Concern Present  (11/09/2021)   [place]    Feeling of Stress : Not at all  Social Connections: [place] (11/09/2021)   Social Connection and Isolation Panel [NHANES]    Frequency of Communication with Friends and Family: More than three times a week    Frequency of Social Gatherings with Friends and Family: More than three times a week    Attends Religious Services: More than 4 times per year    Active Member of [employer] or Organizations: Yes    Attends Music therapist: More than 4 times per year    Marital Status: Married     Family History:  The patient's family history includes Brain cancer in his father; Cancer in his father; Colon cancer in his mother; Liver cancer in his mother. There is no history of Prostate cancer.  ROS:   Please see the history of present illness.  (+) Fatigue/Malaise (+) Abdominal discomfort (+) Large ecchymosis of upper RLE All other systems reviewed and negative.   EKGs/Labs/Other Studies Reviewed:    The following studies were reviewed today:  Echo 06/27/21:  1. Left ventricular ejection fraction, by estimation, is 50 to 55%. The  left ventricle has low normal function. The left ventricle demonstrates  global hypokinesis. The left ventricular internal cavity size was mildly  dilated. Left ventricular diastolic  parameters are indeterminate.   2. Right ventricular systolic function is normal. The right ventricular  size is normal.   3. Left atrial size was severely dilated.   4. Right atrial size was severely dilated.   5. The mitral valve is grossly normal. Mild mitral valve regurgitation.  No evidence of mitral stenosis.   6. The aortic valve is normal in structure. Aortic valve regurgitation is  not visualized. No aortic stenosis is present.   7. Aortic dilatation noted. There is borderline dilatation of the  ascending aorta, measuring 38 mm.   CTA Chest 07/13/19 1. Small  filling defect is seen in lower lobe branch of right pulmonary artery consistent with small pulmonary embolus. Critical Value/emergent results were called by telephone at the time of interpretation on 07/13/2019 at 3:21 pm to Marianna , who verbally acknowledged these results. 2. Small cardiomegaly. 3. Large bilateral airspace opacities are noted concerning for multifocal pneumonia. 4. Aortic atherosclerosis. Aortic Atherosclerosis (ICD10-I70.0).  EKG:  EKG is personally reviewed. 03/15/2022:  Atrial fibrillation. Rate 95 bpm. Diffuse ST/T changes. 06/13/21: Afib, rate 115 bpm  Recent Labs: 06/13/2021: Magnesium 2.2 11/09/2021: ALT 38; BUN 13; Creatinine, Ser 1.00; Hemoglobin 13.9; Platelets 186.0; Potassium 4.2; Sodium 139; TSH 7.86   Recent Lipid Panel    Component Value Date/Time   CHOL 139 11/09/2021 0839   CHOL 139 04/22/2019 1010   TRIG 139.0 11/09/2021 0839   HDL 42.90 11/09/2021 0839   HDL 44 04/22/2019 1010   CHOLHDL 3 11/09/2021 0839   VLDL 27.8 11/09/2021 0839   LDLCALC 68 11/09/2021 0839   LDLCALC 73 04/22/2019 1010    CHA2DS2-VASc Score =   '[ ]'$ .  Therefore, the patient's annual risk of stroke is   %.        Physical Exam:    VS:  BP 126/74 (BP Location: Right Arm, Patient Position: Sitting, Cuff Size: Normal)   Pulse 95   Ht 6' (1.829 m)   Wt 219 lb 12.8 oz (99.7 kg)   BMI 29.81 kg/m  , BMI Body mass index is 29.81 kg/m. GENERAL:  Well appearing HEENT: Pupils equal round and reactive, fundi not visualized, oral mucosa unremarkable NECK:  No jugular venous distention, waveform within normal limits, carotid upstroke brisk and symmetric, no bruits, no thyromegaly LUNGS:  Clear to auscultation bilaterally HEART:  Irregularly Irregular.  PMI not displaced or sustained,S1 and S2 within normal limits, no S3, no S4, no clicks, no rubs, no murmurs ABD:  Flat, positive bowel sounds normal in frequency in pitch, no bruits, no rebound, no guarding, no midline  pulsatile mass, no hepatomegaly, no splenomegaly EXT:  2 plus pulses throughout, no edema, no cyanosis no clubbing SKIN:  No rashes no nodules; large ecchymosis of upper RLE NEURO:  Cranial nerves II through XII grossly  intact, motor grossly intact throughout PSYCH:  Cognitively intact, oriented to person place and time  ASSESSMENT:    1. Primary hypertension   2. PAF (paroxysmal atrial fibrillation) ([place])   3. Pre-procedure lab exam      PLAN:    HTN (hypertension) Blood pressure is controlled on HCTZ and metoprolol.    PAF (paroxysmal atrial fibrillation) ([place]) He is back in atrial fibrillation.  Rates are well-controlled.  Continue metoprolol and Xarelto.  He has not missed any doses of his Xarelto.  We will plan for DCCV on 9/11 at 1 PM.  He will follow-up with atrial fibrillation clinic in 1 to 2 weeks after the procedure.  He is still contemplating antiarrhythmics or ablation.  Now that he is having more frequent episodes of A-fib he is leaning more heavily towards ablation.  He is hesitant about potential side effects of antiarrhythmics.  Shared Decision Making/Informed Consent The risks (stroke, cardiac arrhythmias rarely resulting in the need for a temporary or permanent pacemaker, skin irritation or burns and complications associated with conscious sedation including aspiration, arrhythmia, respiratory failure and death), benefits (restoration of normal sinus rhythm) and alternatives of a direct current cardioversion were explained in detail to Matthew Sullivan and he agrees to proceed.       Disposition:  FU with Evett Kassa C. Oval Linsey, MD, [facility] in 4 months.  Medication Adjustments/Labs and Tests Ordered: Current medicines are reviewed at length with the patient today.  Concerns regarding medicines are outlined above.   Orders Placed This Encounter  Procedures   CBC with Differential/Platelet   Basic metabolic panel   Amb Referral to AFIB Clinic   EKG 12-Lead   No orders of  the defined types were placed in this encounter.  I,Mathew Stumpf,acting as a Education administrator for Skeet Latch, MD.,have documented all relevant documentation on the behalf of Skeet Latch, MD,as directed by  Skeet Latch, MD while in the presence of Skeet Latch, MD.  I, [place] Oval Linsey, MD have reviewed all documentation for this visit.  The documentation of the exam, diagnosis, procedures, and orders on 03/15/2022 are all accurate and complete.   Signed, Skeet Latch, MD  03/15/2022 1:01 PM    [facility]

## 2022-03-14 NOTE — Telephone Encounter (Signed)
Patient c/o Palpitations:  High priority if patient c/o lightheadedness, shortness of breath, or chest pain  How long have you had palpitations/irregular HR/ Afib? Are you having the symptoms now? Since Friday 03/10/22   Are you currently experiencing lightheadedness, SOB or CP? no  3)   Do you have a history of afib (atrial fibrillation) or irregular heart rhythm? Yes   Have you checked your BP or HR? (document readings if available): 115/78   Are you experiencing any other symptoms? Pt spouse states he has SOB only when moving around. . She states he has been in afib since Friday.

## 2022-03-14 NOTE — H&P (View-Only) (Signed)
Cardiology Office Note:    Date:  03/15/2022   ID:  Matthew Sullivan, DOB 07/27/1945, MRN [id number]  PCP:  Tonia Ghent, MD  Cardiologist:  Mertie Moores, MD    Referring MD: Tonia Ghent, MD   No chief complaint on file.   History of Present Illness:    Matthew Sullivan is a 76 y.o. male with a hx of hypertension, hyperlipidemia, atrial myxoma, atrial fibrillation, PE, and TIA, who presents today follow up.  He was first seen in clinic 06/2021. His L atrial myxoma was removed in 2013. No CAD on cath. He has post-op atrial fibrillation and underwent cardioversion on 06/2013. He had a recurrent episode in 2017 and had DCCV. At his first visit he was in atrial fibrillation and his rates were not well controlled.  Metoprolol was increased to 25 mg.  He had not missed any doses of his Xarelto and underwent cardioversion on 06/23/2021.  He had an echo 06/2021 that revealed LVEF 50 to 55% with indeterminate diastolic function.  He had mild mitral regurgitation and the ascending aorta was mildly dilated at 3.8 cm he was set up for sleep study but it has not yet been performed.  He saw Dr. Curt Bears on 07/2021 and discussed ablation but plan to continue monitoring for now.  Today, he is accompanied by a family member. He reports that he went back into Afib last Friday at 7:00. He is feeling lethargic, and he has also been experiencing atypical abdominal discomfort. He denies any chest pain or pressure. He notes this is the first time his cardioversion only lasted for 9 months (previously up to 4 years). At home his heart rate has been ranging 90-98 bpm. Also he indicates a large ecchymosis of his RLE due to a muscle cramp. Of note he has a planned molar extraction soon. He denies any shortness of breath, or peripheral edema. No lightheadedness, headaches, syncope, orthopnea, or PND.   Past Medical History:  Diagnosis Date   Ascending aortic aneurysm ([place]) 09/02/2021   Atrial fibrillation, currently  in sinus rhythm    Atrial myxoma    Cervical osteoarthritis    History of TIA (transient ischemic attack)    History of tinnitus    Hyperlipidemia    Hypertension    Melanoma ([place])    per Dr. Allyson Sabal, local excision, no chemo/no rady tx.     Past Surgical History:  Procedure Laterality Date   ANKLE ARTHROSCOPY WITH ARTHRODESIS Left 08/26/2015   Procedure: LEFT ANKLE ARTHROSCOPY TIBIOTALAR ARTHRODESIS;  Surgeon: [name], MD;  Location: [place];  Service: Orthopedics;  Laterality: Left;   APPENDECTOMY     BACK SURGERY     CARDIOVERSION N/A 06/25/2013   Procedure: CARDIOVERSION;  Surgeon: [name], MD;  Location: [facility] ENDOSCOPY;  Service: Cardiovascular;  Laterality: N/A;   CARDIOVERSION N/A 06/23/2021   Procedure: CARDIOVERSION;  Surgeon: Thayer Headings, MD;  Location[facility] ENDOSCOPY;  Service: Cardiovascular;  Laterality: N/A;   EXCISION OF ATRIAL MYXOMA Left 04/09/2013   Procedure: EXCISION OF ATRIAL MYXOMA;  Surgeon: [name], MD;  Location: [place];  Service: Open Heart Surgery;  Laterality: Left;   INTRAOPERATIVE TRANSESOPHAGEAL ECHOCARDIOGRAM N/A 04/09/2013   Procedure: INTRAOPERATIVE TRANSESOPHAGEAL ECHOCARDIOGRAM;  Surgeon: [name], MD;  Location: [place];  Service: Open Heart Surgery;  Laterality: N/A;   LEFT HEART CATHETERIZATION WITH CORONARY ANGIOGRAM N/A 04/07/2013   Procedure: LEFT HEART CATHETERIZATION WITH CORONARY ANGIOGRAM;  Surgeon: Juanda Bond  Burt Knack, MD;  [facility] C[facility] LAB;  Service: Cardiovascular;  Laterality: N/A;   NASAL SEPTUM SURGERY     NOSE SURGERY     ROTATOR CUFF REPAIR     both shoulders    Current Medications: Current Meds  Medication Sig   acetaminophen (TYLENOL) 500 MG tablet Take 1,000 mg by mouth every 6 (six) hours as needed for moderate pain.   atorvastatin (LIPITOR) 80 MG tablet Take 0.5 tablets (40 mg total) by mouth daily.   hydrochlorothiazide (HYDRODIURIL) 25 MG tablet Take 1 tablet (25  mg total) by mouth daily.   levothyroxine (SYNTHROID) 25 MCG tablet Take 1 tablet (25 mcg total) by mouth daily.   metoprolol tartrate (LOPRESSOR) 25 MG tablet Take 1 tablet (25 mg total) by mouth 2 (two) times daily.   Multiple Vitamin (MULTIVITAMIN WITH MINERALS) TABS tablet Take 1 tablet by mouth daily.   potassium chloride SA (KLOR-CON) 20 MEQ tablet TAKE 1 TABLET BY MOUTH 2 TIMES DAILY.   saw palmetto 500 MG capsule Take 500 mg by mouth 2 (two) times daily.   XARELTO 20 MG TABS tablet TAKE 1 TABLET BY MOUTH DAILY WITH SUPPER.     Allergies:   Patient has no known allergies.   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   Social Determinants of Health   Financial Resource Strain: Low Risk  (11/09/2021)   Overall Financial Resource Strain (CARDIA)    Difficulty of Paying Living Expenses: Not hard at all  Food Insecurity: No Food Insecurity (11/09/2021)   Hunger Vital Sign    Worried About Running Out of Food in the Last Year: Never true    [place] in the Last Year: Never true  Transportation Needs: No Transportation Needs (11/09/2021)   PRAPARE - Hydrologist (Medical): No    Lack of Transportation (Non-Medical): No  Physical Activity: Sufficiently Active (11/09/2021)   Exercise Vital Sign    Days of Exercise per Week: 5 days    Minutes of Exercise per Session: 30 min  Stress: No Stress Concern Present  (11/09/2021)   [place]    Feeling of Stress : Not at all  Social Connections: [place] (11/09/2021)   Social Connection and Isolation Panel [NHANES]    Frequency of Communication with Friends and Family: More than three times a week    Frequency of Social Gatherings with Friends and Family: More than three times a week    Attends Religious Services: More than 4 times per year    Active Member of [employer] or Organizations: Yes    Attends Music therapist: More than 4 times per year    Marital Status: Married     Family History:  The patient's family history includes Brain cancer in his father; Cancer in his father; Colon cancer in his mother; Liver cancer in his mother. There is no history of Prostate cancer.  ROS:   Please see the history of present illness.  (+) Fatigue/Malaise (+) Abdominal discomfort (+) Large ecchymosis of upper RLE All other systems reviewed and negative.   EKGs/Labs/Other Studies Reviewed:    The following studies were reviewed today:  Echo 06/27/21:  1. Left ventricular ejection fraction, by estimation, is 50 to 55%. The  left ventricle has low normal function. The left ventricle demonstrates  global hypokinesis. The left ventricular internal cavity size was mildly  dilated. Left ventricular diastolic  parameters are indeterminate.   2. Right ventricular systolic function is normal. The right ventricular  size is normal.   3. Left atrial size was severely dilated.   4. Right atrial size was severely dilated.   5. The mitral valve is grossly normal. Mild mitral valve regurgitation.  No evidence of mitral stenosis.   6. The aortic valve is normal in structure. Aortic valve regurgitation is  not visualized. No aortic stenosis is present.   7. Aortic dilatation noted. There is borderline dilatation of the  ascending aorta, measuring 38 mm.   CTA Chest 07/13/19 1. Small  filling defect is seen in lower lobe branch of right pulmonary artery consistent with small pulmonary embolus. Critical Value/emergent results were called by telephone at the time of interpretation on 07/13/2019 at 3:21 pm to Dickson , who verbally acknowledged these results. 2. Small cardiomegaly. 3. Large bilateral airspace opacities are noted concerning for multifocal pneumonia. 4. Aortic atherosclerosis. Aortic Atherosclerosis (ICD10-I70.0).  EKG:  EKG is personally reviewed. 03/15/2022:  Atrial fibrillation. Rate 95 bpm. Diffuse ST/T changes. 06/13/21: Afib, rate 115 bpm  Recent Labs: 06/13/2021: Magnesium 2.2 11/09/2021: ALT 38; BUN 13; Creatinine, Ser 1.00; Hemoglobin 13.9; Platelets 186.0; Potassium 4.2; Sodium 139; TSH 7.86   Recent Lipid Panel    Component Value Date/Time   CHOL 139 11/09/2021 0839   CHOL 139 04/22/2019 1010   TRIG 139.0 11/09/2021 0839   HDL 42.90 11/09/2021 0839   HDL 44 04/22/2019 1010   CHOLHDL 3 11/09/2021 0839   VLDL 27.8 11/09/2021 0839   LDLCALC 68 11/09/2021 0839   LDLCALC 73 04/22/2019 1010    CHA2DS2-VASc Score =   '[ ]'$ .  Therefore, the patient's annual risk of stroke is   %.        Physical Exam:    VS:  BP 126/74 (BP Location: Right Arm, Patient Position: Sitting, Cuff Size: Normal)   Pulse 95   Ht 6' (1.829 m)   Wt 219 lb 12.8 oz (99.7 kg)   BMI 29.81 kg/m  , BMI Body mass index is 29.81 kg/m. GENERAL:  Well appearing HEENT: Pupils equal round and reactive, fundi not visualized, oral mucosa unremarkable NECK:  No jugular venous distention, waveform within normal limits, carotid upstroke brisk and symmetric, no bruits, no thyromegaly LUNGS:  Clear to auscultation bilaterally HEART:  Irregularly Irregular.  PMI not displaced or sustained,S1 and S2 within normal limits, no S3, no S4, no clicks, no rubs, no murmurs ABD:  Flat, positive bowel sounds normal in frequency in pitch, no bruits, no rebound, no guarding, no midline  pulsatile mass, no hepatomegaly, no splenomegaly EXT:  2 plus pulses throughout, no edema, no cyanosis no clubbing SKIN:  No rashes no nodules; large ecchymosis of upper RLE NEURO:  Cranial nerves II through XII grossly  intact, motor grossly intact throughout PSYCH:  Cognitively intact, oriented to person place and time  ASSESSMENT:    1. Primary hypertension   2. PAF (paroxysmal atrial fibrillation) ([place])   3. Pre-procedure lab exam      PLAN:    HTN (hypertension) Blood pressure is controlled on HCTZ and metoprolol.    PAF (paroxysmal atrial fibrillation) ([place]) He is back in atrial fibrillation.  Rates are well-controlled.  Continue metoprolol and Xarelto.  He has not missed any doses of his Xarelto.  We will plan for DCCV on 9/11 at 1 PM.  He will follow-up with atrial fibrillation clinic in 1 to 2 weeks after the procedure.  He is still contemplating antiarrhythmics or ablation.  Now that he is having more frequent episodes of A-fib he is leaning more heavily towards ablation.  He is hesitant about potential side effects of antiarrhythmics.  Shared Decision Making/Informed Consent The risks (stroke, cardiac arrhythmias rarely resulting in the need for a temporary or permanent pacemaker, skin irritation or burns and complications associated with conscious sedation including aspiration, arrhythmia, respiratory failure and death), benefits (restoration of normal sinus rhythm) and alternatives of a direct current cardioversion were explained in detail to Mr. Marczak and he agrees to proceed.       Disposition:  FU with Lidiya Reise C. Oval Linsey, MD, [facility] in 4 months.  Medication Adjustments/Labs and Tests Ordered: Current medicines are reviewed at length with the patient today.  Concerns regarding medicines are outlined above.   Orders Placed This Encounter  Procedures   CBC with Differential/Platelet   Basic metabolic panel   Amb Referral to AFIB Clinic   EKG 12-Lead   No orders of  the defined types were placed in this encounter.  I,Mathew Stumpf,acting as a Education administrator for Skeet Latch, MD.,have documented all relevant documentation on the behalf of Skeet Latch, MD,as directed by  Skeet Latch, MD while in the presence of Skeet Latch, MD.  I, [place] Oval Linsey, MD have reviewed all documentation for this visit.  The documentation of the exam, diagnosis, procedures, and orders on 03/15/2022 are all accurate and complete.   Signed, Skeet Latch, MD  03/15/2022 1:01 PM    [facility]

## 2022-03-15 ENCOUNTER — Telehealth (HOSPITAL_BASED_OUTPATIENT_CLINIC_OR_DEPARTMENT_OTHER): Payer: Self-pay | Admitting: *Deleted

## 2022-03-15 ENCOUNTER — Ambulatory Visit (HOSPITAL_BASED_OUTPATIENT_CLINIC_OR_DEPARTMENT_OTHER): Payer: Medicare HMO | Admitting: Cardiovascular Disease

## 2022-03-15 ENCOUNTER — Encounter (HOSPITAL_BASED_OUTPATIENT_CLINIC_OR_DEPARTMENT_OTHER): Payer: Self-pay | Admitting: Cardiovascular Disease

## 2022-03-15 VITALS — BP 126/74 | HR 95 | Ht 72.0 in | Wt 219.8 lb

## 2022-03-15 DIAGNOSIS — I480 Paroxysmal atrial fibrillation: Secondary | ICD-10-CM

## 2022-03-15 DIAGNOSIS — I10 Essential (primary) hypertension: Secondary | ICD-10-CM | POA: Diagnosis not present

## 2022-03-15 DIAGNOSIS — Z01812 Encounter for preprocedural laboratory examination: Secondary | ICD-10-CM | POA: Diagnosis not present

## 2022-03-15 NOTE — Assessment & Plan Note (Signed)
Blood pressure is controlled on HCTZ and metoprolol.

## 2022-03-15 NOTE — Telephone Encounter (Signed)
Xarelto 20 mg #2 Lot [id number] exp 3/25 given to patient at office visit today

## 2022-03-15 NOTE — Assessment & Plan Note (Addendum)
He is back in atrial fibrillation.  Rates are well-controlled.  Continue metoprolol and Xarelto.  He has not missed any doses of his Xarelto.  We will plan for DCCV on 9/11 at 1 PM.  He will follow-up with atrial fibrillation clinic in 1 to 2 weeks after the procedure.  He is still contemplating antiarrhythmics or ablation.  Now that he is having more frequent episodes of A-fib he is leaning more heavily towards ablation.  He is hesitant about potential side effects of antiarrhythmics.  Shared Decision Making/Informed Consent The risks (stroke, cardiac arrhythmias rarely resulting in the need for a temporary or permanent pacemaker, skin irritation or burns and complications associated with conscious sedation including aspiration, arrhythmia, respiratory failure and death), benefits (restoration of normal sinus rhythm) and alternatives of a direct current cardioversion were explained in detail to Matthew Sullivan and he agrees to proceed.

## 2022-03-15 NOTE — Patient Instructions (Signed)
Medication Instructions:  Your physician recommends that you continue on your current medications as directed. Please refer to the Current Medication list given to you today.  *If you need a refill on your cardiac medications before your next appointment, please call your pharmacy*   Lab Work: CBC/BMET TODAY   If you have labs (blood work) drawn today and your tests are completely normal, you will receive your results only by: [place] (if you have MyChart) OR A paper copy in the mail If you have any lab test that is abnormal or we need to change your treatment, we will call you to review the results.   Testing/Procedures: CARDIOVERSION 03/20/2022   Follow-Up: At [facility], you and your health needs are our priority.  As part of our continuing mission to provide you with exceptional heart care, we have created designated Provider Care Teams.  These Care Teams include your primary Cardiologist (physician) and Advanced Practice Providers (APPs -  Physician Assistants and Nurse Practitioners) who all work together to provide you with the care you need, when you need it.  We recommend signing up for the patient portal called "MyChart".  Sign up information is provided on this After Visit Summary.  MyChart is used to connect with patients for Virtual Visits (Telemedicine).  Patients are able to view lab/test results, encounter notes, upcoming appointments, etc.  Non-urgent messages can be sent to your provider as well.   To learn more about what you can do with MyChart, go to [email or web address].    Your next appointment:   4 month(s)  The format for your next appointment:   In Person  Provider:   Skeet Latch, MD    You have been referred to AFIB CLINIC 1-2 [place]  Other Instructions  You are scheduled for a Cardioversion on 03/20/2022 with Dr. Oval Linsey.  Please arrive at the [facility] (Main Entrance A) at [facility]: [address] [place], [place] [address] at [place]. (1 hour prior to procedure unless lab work is needed; if lab work is needed arrive 1.5 hours ahead)  DIET: Nothing to eat or drink after midnight except a sip of water with medications (see medication instructions below)  FYI: For your safety, and to allow [place] to monitor your vital signs accurately during the surgery/procedure we request that   if you have artificial nails, gel coating, SNS etc. Please have those removed prior to your surgery/procedure. Not having the nail coverings /polish removed may result in cancellation or delay of your surgery/procedure.   Medication Instructions:  Continue your anticoagulant: XARELTO  You will need to continue your anticoagulant after your procedure until you  are told by your  Provider that it is safe to stop   Labs: TODAY  You must have a responsible person to drive you home and stay in the waiting area during your procedure. Failure to do so could result in cancellation.  Bring your insurance cards.  *Special Note: Every effort is made to have your procedure done on time. Occasionally there are emergencies that occur at the hospital that may cause delays. Please be patient if a delay does occur.

## 2022-03-16 LAB — CBC WITH DIFFERENTIAL/PLATELET
Basophils Absolute: 0 10*3/uL (ref 0.0–0.2)
Basos: 0 %
EOS (ABSOLUTE): 0.1 10*3/uL (ref 0.0–0.4)
Eos: 2 %
Hematocrit: 42.6 % (ref 37.5–51.0)
Hemoglobin: 13.9 g/dL (ref 13.0–17.7)
Immature Grans (Abs): 0 10*3/uL (ref 0.0–0.1)
Immature Granulocytes: 1 %
Lymphocytes Absolute: 1.3 10*3/uL (ref 0.7–3.1)
Lymphs: 16 %
MCH: 30 pg (ref 26.6–33.0)
MCHC: 32.6 g/dL (ref 31.5–35.7)
MCV: 92 fL (ref 79–97)
Monocytes Absolute: 0.7 10*3/uL (ref 0.1–0.9)
Monocytes: 8 %
Neutrophils Absolute: 6.1 10*3/uL (ref 1.4–7.0)
Neutrophils: 73 %
Platelets: 152 10*3/uL (ref 150–450)
RBC: 4.64 x10E6/uL (ref 4.14–5.80)
RDW: 13.3 % (ref 11.6–15.4)
WBC: 8.2 10*3/uL (ref 3.4–10.8)

## 2022-03-16 LAB — BASIC METABOLIC PANEL
BUN/Creatinine Ratio: 16 (ref 10–24)
BUN: 17 mg/dL (ref 8–27)
CO2: 26 mmol/L (ref 20–29)
Calcium: 8.7 mg/dL (ref 8.6–10.2)
Chloride: 101 mmol/L (ref 96–106)
Creatinine, Ser: 1.04 mg/dL (ref 0.76–1.27)
Glucose: 156 mg/dL — ABNORMAL HIGH (ref 70–99)
Potassium: 3.9 mmol/L (ref 3.5–5.2)
Sodium: 142 mmol/L (ref 134–144)
eGFR: 74 mL/min/{1.73_m2} (ref >59)

## 2022-03-20 ENCOUNTER — Encounter (HOSPITAL_COMMUNITY): Payer: Self-pay | Admitting: Cardiovascular Disease

## 2022-03-20 ENCOUNTER — Ambulatory Visit (HOSPITAL_COMMUNITY)
Admission: RE | Admit: 2022-03-20 | Discharge: 2022-03-20 | Disposition: A | Discharge status: Home / Self Care | Payer: Medicare HMO | Attending: Cardiovascular Disease | Admitting: Cardiovascular Disease

## 2022-03-20 ENCOUNTER — Encounter (HOSPITAL_COMMUNITY)
Admission: RE | Disposition: A | Discharge status: Home / Self Care | Payer: Self-pay | Source: Home / Self Care | Attending: Cardiovascular Disease

## 2022-03-20 ENCOUNTER — Ambulatory Visit (HOSPITAL_COMMUNITY): Payer: Medicare HMO | Admitting: Anesthesiology

## 2022-03-20 ENCOUNTER — Ambulatory Visit (HOSPITAL_BASED_OUTPATIENT_CLINIC_OR_DEPARTMENT_OTHER): Payer: Medicare HMO | Admitting: Anesthesiology

## 2022-03-20 ENCOUNTER — Other Ambulatory Visit: Payer: Self-pay

## 2022-03-20 DIAGNOSIS — E785 Hyperlipidemia, unspecified: Secondary | ICD-10-CM | POA: Insufficient documentation

## 2022-03-20 DIAGNOSIS — I10 Essential (primary) hypertension: Secondary | ICD-10-CM | POA: Insufficient documentation

## 2022-03-20 DIAGNOSIS — I252 Old myocardial infarction: Secondary | ICD-10-CM | POA: Diagnosis not present

## 2022-03-20 DIAGNOSIS — Z87891 Personal history of nicotine dependence: Secondary | ICD-10-CM | POA: Diagnosis not present

## 2022-03-20 DIAGNOSIS — Z79899 Other long term (current) drug therapy: Secondary | ICD-10-CM | POA: Insufficient documentation

## 2022-03-20 DIAGNOSIS — Z7901 Long term (current) use of anticoagulants: Secondary | ICD-10-CM | POA: Diagnosis not present

## 2022-03-20 DIAGNOSIS — Z8673 Personal history of transient ischemic attack (TIA), and cerebral infarction without residual deficits: Secondary | ICD-10-CM | POA: Insufficient documentation

## 2022-03-20 DIAGNOSIS — I480 Paroxysmal atrial fibrillation: Secondary | ICD-10-CM | POA: Insufficient documentation

## 2022-03-20 DIAGNOSIS — I4891 Unspecified atrial fibrillation: Secondary | ICD-10-CM

## 2022-03-20 HISTORY — PX: CARDIOVERSION: SHX1299

## 2022-03-20 SURGERY — CARDIOVERSION
Anesthesia: General

## 2022-03-20 MED ORDER — PROPOFOL 10 MG/ML IV BOLUS
INTRAVENOUS | Status: DC | PRN
  Administered 2022-03-20: 70 mg via INTRAVENOUS

## 2022-03-20 MED ORDER — LIDOCAINE 2% (20 MG/ML) 5 ML SYRINGE
INTRAMUSCULAR | Status: DC | PRN
  Administered 2022-03-20: 60 mg via INTRAVENOUS

## 2022-03-20 MED ORDER — SODIUM CHLORIDE 0.9 % IV SOLN: INTRAVENOUS | Status: DC

## 2022-03-20 MED ORDER — SODIUM CHLORIDE 0.9 % IV SOLN
INTRAVENOUS | Status: AC | PRN
  Administered 2022-03-20: 500 mL via INTRAVENOUS

## 2022-03-20 NOTE — Anesthesia Postprocedure Evaluation (Signed)
Anesthesia Post Note  Patient: Matthew Sullivan  Procedure(s) Performed: CARDIOVERSION     Anesthesia Post Evaluation No notable events documented.  Last Vitals:  Vitals:   03/20/22 1350 03/20/22 1400  BP: 124/75 121/87  Pulse: 76 75  Resp: 13 18  Temp:    SpO2: 97% 96%    Last Pain:  Vitals:   03/20/22 1400  TempSrc:   PainSc: 0-No pain                 Aviendha Azbell L Anevay Campanella

## 2022-03-20 NOTE — Interval H&P Note (Signed)
History and Physical Interval Note:  03/20/2022 1:03 PM  Matthew Sullivan  has presented today for surgery, with the diagnosis of AFIB.  The various methods of treatment have been discussed with the patient and family. After consideration of risks, benefits and other options for treatment, the patient has consented to  Procedure(s): CARDIOVERSION (N/A) as a surgical intervention.  The patient's history has been reviewed, patient examined, no change in status, stable for surgery.  I have reviewed the patient's chart and labs.  Questions were answered to the patient's satisfaction.     Skeet Latch, MD

## 2022-03-20 NOTE — Transfer of Care (Signed)
Immediate Anesthesia Transfer of Care Note  Patient: Matthew Sullivan  Procedure(s) Performed: CARDIOVERSION  Patient Location: Endoscopy Unit  Anesthesia Type:General  Level of Consciousness: awake, alert  and oriented  Airway & Oxygen Therapy: Patient Spontanous Breathing  Post-op Assessment: Report given to RN and Post -op Vital signs reviewed and stable  Post vital signs: Reviewed and stable  Last Vitals:  Vitals Value Taken Time  BP    Temp    Pulse    Resp    SpO2      Last Pain:  Vitals:   03/20/22 1219  TempSrc: Temporal  PainSc: 0-No pain         Complications: No notable events documented.

## 2022-03-20 NOTE — CV Procedure (Addendum)
Electrical Cardioversion Procedure Note Matthew Sullivan [id number] [birth year]  Procedure: Electrical Cardioversion Indications:  Atrial Fibrillation  Procedure Details Consent: Risks of procedure as well as the alternatives and risks of each were explained to the (patient/caregiver).  Consent for procedure obtained. Time Out: Verified patient identification, verified procedure, site/side was marked, verified correct patient position, special equipment/implants available, medications/allergies/relevent history reviewed, required imaging and test results available.  Performed  Patient placed on cardiac monitor, pulse oximetry, supplemental oxygen as necessary.  Sedation given:  propofol Pacer pads placed anterior and posterior chest.  Cardioverted 1 time(s).  Cardioverted at 150J.  Evaluation Findings: Post procedure EKG shows: NSR Complications: None Patient did tolerate procedure well.   Skeet Latch, MD 03/20/2022, 1:36 PM   Addendum:  After completing this note, patient went back into atrial fibrillation with controlled rates.  We discussed the fact that repeating DCCV is of little utility.  We will arrange for close follow up with the atrial fibrillation clinic to discuss ablation vs antiarrhythmics.  Laverta Harnisch C. Oval Linsey, MD, [facility] 03/20/2022 1:48 PM

## 2022-03-20 NOTE — Anesthesia Preprocedure Evaluation (Addendum)
Anesthesia Evaluation  Patient identified by MRN, date of birth, ID band Patient awake    Reviewed: Allergy & Precautions, NPO status , Patient's Chart, lab work & pertinent test results, reviewed documented beta blocker date and time   Airway Mallampati: III  TM Distance: >3 FB Neck ROM: Full    Dental  (+) Teeth Intact, Dental Advisory Given   Pulmonary neg pulmonary ROS, former smoker,    Pulmonary exam normal breath sounds clear to auscultation       Cardiovascular hypertension, Pt. on medications and Pt. on home beta blockers + Past MI  Normal cardiovascular exam+ dysrhythmias (on xarelto) Atrial Fibrillation  Rhythm:Regular Rate:Normal  TTE 2022 1. Left ventricular ejection fraction, by estimation, is 50 to 55%. The  left ventricle has low normal function. The left ventricle demonstrates  global hypokinesis. The left ventricular internal cavity size was mildly  dilated. Left ventricular diastolic  parameters are indeterminate.  2. Right ventricular systolic function is normal. The right ventricular  size is normal.  3. Left atrial size was severely dilated.  4. Right atrial size was severely dilated.  5. The mitral valve is grossly normal. Mild mitral valve regurgitation.  No evidence of mitral stenosis.  6. The aortic valve is normal in structure. Aortic valve regurgitation is  not visualized. No aortic stenosis is present.  7. Aortic dilatation noted. There is borderline dilatation of the  ascending aorta, measuring 38 mm.    Neuro/Psych TIAnegative psych ROS   GI/Hepatic negative GI ROS, Neg liver ROS,   Endo/Other  Hypothyroidism   Renal/GU negative Renal ROS  negative genitourinary   Musculoskeletal  (+) Arthritis ,   Abdominal   Peds  Hematology negative hematology ROS (+)   Anesthesia Other Findings   Reproductive/Obstetrics                            Anesthesia  Physical Anesthesia Plan  ASA: 3  Anesthesia Plan: General   Post-op Pain Management:    Induction: Intravenous  PONV Risk Score and Plan: 2 and Propofol infusion and Treatment may vary due to age or medical condition  Airway Management Planned: Natural Airway  Additional Equipment:   Intra-op Plan:   Post-operative Plan:   Informed Consent: I have reviewed the patients History and Physical, chart, labs and discussed the procedure including the risks, benefits and alternatives for the proposed anesthesia with the patient or authorized representative who has indicated his/her understanding and acceptance.     Dental advisory given  Plan Discussed with: CRNA  Anesthesia Plan Comments:         Anesthesia Quick Evaluation

## 2022-03-20 NOTE — Anesthesia Procedure Notes (Signed)
Procedure Name: General with mask airway Date/Time: 03/20/2022 1:27 PM  Performed by: Griffin Dakin, CRNAPre-anesthesia Checklist: Patient identified, Emergency Drugs available, Suction available, Patient being monitored and Timeout performed Patient Re-evaluated:Patient Re-evaluated prior to induction Oxygen Delivery Method: Ambu bag Preoxygenation: Pre-oxygenation with 100% oxygen Induction Type: IV induction Placement Confirmation: positive ETCO2 and breath sounds checked- equal and bilateral Dental Injury: Teeth and Oropharynx as per pre-operative assessment

## 2022-03-20 NOTE — Discharge Instructions (Signed)

## 2022-03-27 ENCOUNTER — Ambulatory Visit (HOSPITAL_COMMUNITY)
Admission: RE | Admit: 2022-03-27 | Discharge: 2022-03-27 | Disposition: A | Discharge status: Home / Self Care | Payer: Medicare HMO | Source: Ambulatory Visit | Attending: Physician Assistant | Admitting: Physician Assistant

## 2022-03-27 VITALS — BP 130/80 | HR 117 | Ht 72.0 in | Wt 220.2 lb

## 2022-03-27 DIAGNOSIS — I10 Essential (primary) hypertension: Secondary | ICD-10-CM | POA: Diagnosis not present

## 2022-03-27 DIAGNOSIS — R0609 Other forms of dyspnea: Secondary | ICD-10-CM | POA: Diagnosis not present

## 2022-03-27 DIAGNOSIS — D6869 Other thrombophilia: Secondary | ICD-10-CM | POA: Diagnosis not present

## 2022-03-27 DIAGNOSIS — Z7901 Long term (current) use of anticoagulants: Secondary | ICD-10-CM | POA: Insufficient documentation

## 2022-03-27 DIAGNOSIS — I4819 Other persistent atrial fibrillation: Secondary | ICD-10-CM | POA: Diagnosis not present

## 2022-03-27 DIAGNOSIS — E785 Hyperlipidemia, unspecified: Secondary | ICD-10-CM | POA: Diagnosis not present

## 2022-03-27 MED ORDER — FLECAINIDE ACETATE 50 MG PO TABS: 50.0000 mg | ORAL_TABLET | Freq: Two times a day (BID) | ORAL | 3 refills | Status: DC

## 2022-03-27 NOTE — Patient Instructions (Signed)
Start Flecainide 50mg twice a day 

## 2022-03-27 NOTE — H&P (View-Only) (Signed)
Primary Care Physician: Tonia Ghent, MD Primary Cardiologist: Dr Oval Linsey  Primary Electrophysiologist: Dr Curt Bears Referring Physician: Dr Doristine Mango Matthew Sullivan is a 76 y.o. male with a history of HTN, HLD, atrial myxoma s/p excision 2013, TIA, atrial fibrillation who presents for follow up in the [facility].  He had postoperative atrial fibrillation and had a cardioversion in 2014.  He was started on amiodarone which was subsequently discontinued. Patient is on Xarelto for a CHADS2VASC score of 5. Patient underwent DCCV on 03/20/22 but returned to afib before leaving the hospital.  On follow up today, patient remains in afib with elevated heart rates. He does have symptoms of dyspnea on exertion. No bleeding issues on anticoagulation.   Today, he denies symptoms of palpitations, chest pain, shortness of breath, orthopnea, PND, lower extremity edema, dizziness, presyncope, syncope, snoring, daytime somnolence, bleeding, or neurologic sequela. The patient is tolerating medications without difficulties and is otherwise without complaint today.    Atrial Fibrillation Risk Factors:  he does not have symptoms or diagnosis of sleep apnea. he does not have a history of rheumatic fever.   he has a BMI of Body mass index is 29.86 kg/m.Marland Kitchen Filed Weights   03/27/22 1014  Weight: 99.9 kg    Family History  Problem Relation Age of Onset   Colon cancer Mother        patient reported that his mother didn't have colon cancer.   Liver cancer Mother    Cancer Father    Brain cancer Father        glioblastoma   Prostate cancer Neg Hx      Atrial Fibrillation Management history:  Previous antiarrhythmic drugs: amiodarone  Previous cardioversions: 2014, 06/23/21, 03/20/22 Previous ablations: none CHADS2VASC score: 5 Anticoagulation history: Xarelto    Past Medical History:  Diagnosis Date   Ascending aortic aneurysm ([place]) 09/02/2021   Atrial  fibrillation, currently in sinus rhythm    Atrial myxoma    Cervical osteoarthritis    History of TIA (transient ischemic attack)    History of tinnitus    Hyperlipidemia    Hypertension    Melanoma ([place])    per Dr. Allyson Sabal, local excision, no chemo/no rady tx.    Past Surgical History:  Procedure Laterality Date   ANKLE ARTHROSCOPY WITH ARTHRODESIS Left 08/26/2015   Procedure: LEFT ANKLE ARTHROSCOPY TIBIOTALAR ARTHRODESIS;  Surgeon: [name], MD;  Location: [place];  Service: Orthopedics;  Laterality: Left;   APPENDECTOMY     BACK SURGERY     CARDIOVERSION N/A 06/25/2013   Procedure: CARDIOVERSION;  Surgeon: [name], MD;  Location: [facility] ENDOSCOPY;  Service: Cardiovascular;  Laterality: N/A;   CARDIOVERSION N/A 06/23/2021   Procedure: CARDIOVERSION;  Surgeon: Thayer Headings, MD;  Location: [facility] ENDOSCOPY;  Service: Cardiovascular;  Laterality: N/A;   CARDIOVERSION N/A 03/20/2022   Procedure: CARDIOVERSION;  Surgeon: Skeet Latch, MD;  Location: [place];  Service: Cardiovascular;  Laterality: N/A;   EXCISION OF ATRIAL MYXOMA Left 04/09/2013   Procedure: EXCISION OF ATRIAL MYXOMA;  Surgeon: [name], MD;  Location: [place];  Service: Open Heart Surgery;  Laterality: Left;   INTRAOPERATIVE TRANSESOPHAGEAL ECHOCARDIOGRAM N/A 04/09/2013   Procedure: INTRAOPERATIVE TRANSESOPHAGEAL ECHOCARDIOGRAM;  Surgeon: [name], MD;  Location: [place];  Service: Open Heart Surgery;  Laterality: N/A;   LEFT HEART CATHETERIZATION WITH CORONARY ANGIOGRAM N/A 04/07/2013   Procedure: LEFT HEART CATHETERIZATION WITH CORONARY ANGIOGRAM;  Surgeon: Legrand Como  Ree Kida, MD;  Location: [facility] CATH LAB;  Service: Cardiovascular;  Laterality: N/A;   NASAL SEPTUM SURGERY     NOSE SURGERY     ROTATOR CUFF REPAIR     both shoulders    Current Outpatient Medications  Medication Sig Dispense Refill   acetaminophen (TYLENOL) 500 MG tablet Take 1,000 mg by mouth every 6  (six) hours as needed for moderate pain.     atorvastatin (LIPITOR) 80 MG tablet Take 0.5 tablets (40 mg total) by mouth daily. 45 tablet 3   hydrochlorothiazide (HYDRODIURIL) 25 MG tablet Take 1 tablet (25 mg total) by mouth daily. 90 tablet 3   levothyroxine (SYNTHROID) 25 MCG tablet Take 1 tablet (25 mcg total) by mouth daily. 90 tablet 3   metoprolol tartrate (LOPRESSOR) 25 MG tablet Take 1 tablet (25 mg total) by mouth 2 (two) times daily. 180 tablet 3   Multiple Vitamin (MULTIVITAMIN WITH MINERALS) TABS tablet Take 1 tablet by mouth daily.     potassium chloride SA (KLOR-CON) 20 MEQ tablet TAKE 1 TABLET BY MOUTH 2 TIMES DAILY. 180 tablet 3   saw palmetto 500 MG capsule Take 500 mg by mouth 2 (two) times daily.     XARELTO 20 MG TABS tablet TAKE 1 TABLET BY MOUTH DAILY WITH SUPPER. 30 tablet 5   No current facility-administered medications for this encounter.    No Known Allergies  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   Social Determinants of Health   Financial Resource Strain: Low Risk  (11/09/2021)   Overall Financial Resource Strain (CARDIA)    Difficulty of Paying Living Expenses: Not hard at all  Food Insecurity: No Food Insecurity (11/09/2021)   Hunger Vital Sign    Worried About Running Out of Food in the Last Year: Never true    [place] in the Last Year: Never true  Transportation Needs: No Transportation Needs  (11/09/2021)   PRAPARE - Hydrologist (Medical): No    Lack of Transportation (Non-Medical): No  Physical Activity: Sufficiently Active (11/09/2021)   Exercise Vital Sign    Days of Exercise per Week: 5 days    Minutes of Exercise per Session: 30 min  Stress: No Stress Concern Present (11/09/2021)   [place]    Feeling of Stress : Not at all  Social Connections: Socially Integrated (11/09/2021)   Social Connection and Isolation Panel [NHANES]    Frequency of Communication with Friends and Family: More than three times a week    Frequency of Social Gatherings with Friends and Family: More than three times a week    Attends Religious Services: More than 4 times per year    Active Member of Clubs or  Organizations: Yes    Attends Music therapist: More than 4 times per year    Marital Status: Married  Human resources officer Violence: Not At Risk (11/09/2021)   Humiliation, Afraid, Rape, and Kick questionnaire    Fear of Current or Ex-Partner: No    Emotionally Abused: No    Physically Abused: No    Sexually Abused: No     ROS- All systems are reviewed and negative except as per the HPI above.  Physical Exam: Vitals:   03/27/22 1014  BP: 130/80  Pulse: (!) 117  Weight: 99.9 kg  Height: 6' (1.829 m)    GEN- The patient is a well appearing elderly male, alert and oriented x 3 today.   Head- normocephalic, atraumatic Eyes-  Sclera clear, conjunctiva pink Ears- hearing intact Oropharynx- clear Neck- supple  Lungs- Clear to ausculation bilaterally, normal work of breathing Heart- irregular rate and rhythm, no murmurs, rubs or gallops  GI- soft, NT, ND, + BS Extremities- no clubbing, cyanosis, or edema MS- no significant deformity or atrophy Skin- no rash or lesion Psych- euthymic mood, full affect Neuro- strength and sensation are intact  Wt Readings from Last 3 Encounters:   03/27/22 99.9 kg  03/20/22 99.3 kg  03/15/22 99.7 kg    EKG today demonstrates  Afib Vent. rate 117 BPM PR interval * ms QRS duration 82 ms QT/QTcB 340/474 ms  Echo 06/27/21 demonstrated   1. Left ventricular ejection fraction, by estimation, is 50 to 55%. The  left ventricle has low normal function. The left ventricle demonstrates  global hypokinesis. The left ventricular internal cavity size was mildly  dilated. Left ventricular diastolic parameters are indeterminate.   2. Right ventricular systolic function is normal. The right ventricular  size is normal.   3. Left atrial size was severely dilated.   4. Right atrial size was severely dilated.   5. The mitral valve is grossly normal. Mild mitral valve regurgitation.  No evidence of mitral stenosis.   6. The aortic valve is normal in structure. Aortic valve regurgitation is  not visualized. No aortic stenosis is present.   7. Aortic dilatation noted. There is borderline dilatation of the  ascending aorta, measuring 38 mm.   Comparison(s): EF 50%.   Epic records are reviewed at length today  CHA2DS2-VASc Score = 5  The patient's score is based upon: CHF History: 0 HTN History: 1 Diabetes History: 0 Stroke History: 2 (TIA) Vascular Disease History: 0 Age Score: 2 Gender Score: 0       ASSESSMENT AND PLAN: 1. Persistent Atrial Fibrillation (ICD10:  I48.19) The patient's CHA2DS2-VASc score is 5, indicating a 7.2% annual risk of stroke.   Patient is s/p DCCV 03/20/22 with very quick return of afib.  We discussed rhythm control options today. Long term, he would like to pursue ablation with Dr Curt Bears (discussed 07/15/21). Short term, will start flecainide 50 mg BID and plan for repeat DCCV if he does not chemically convert. Will plan for TST once he is back in SR.  Continue Lopressor 25 mg BID Continue Xarelto 20 mg daily  2. Secondary Hypercoagulable State (ICD10:  D68.69) The patient is at significant risk for  stroke/thromboembolism based upon his CHA2DS2-VASc Score of 5.  Continue Rivaroxaban (Xarelto).   3. HTN Stable, no changes today.   Follow up in the AF clinic later this week for ECG after flecainide start.    [facility] [place],  [place] [address] [phone] 03/27/2022 10:41 AM

## 2022-03-27 NOTE — Progress Notes (Signed)
Primary Care Physician: Tonia Ghent, MD Primary Cardiologist: Dr Oval Linsey  Primary Electrophysiologist: Dr Curt Bears Referring Physician: Dr Doristine Mango Matthew Sullivan is a 76 y.o. male with a history of HTN, HLD, atrial myxoma s/p excision 2013, TIA, atrial fibrillation who presents for follow up in the [facility].  He had postoperative atrial fibrillation and had a cardioversion in 2014.  He was started on amiodarone which was subsequently discontinued. Patient is on Xarelto for a CHADS2VASC score of 5. Patient underwent DCCV on 03/20/22 but returned to afib before leaving the hospital.  On follow up today, patient remains in afib with elevated heart rates. He does have symptoms of dyspnea on exertion. No bleeding issues on anticoagulation.   Today, he denies symptoms of palpitations, chest pain, shortness of breath, orthopnea, PND, lower extremity edema, dizziness, presyncope, syncope, snoring, daytime somnolence, bleeding, or neurologic sequela. The patient is tolerating medications without difficulties and is otherwise without complaint today.    Atrial Fibrillation Risk Factors:  he does not have symptoms or diagnosis of sleep apnea. he does not have a history of rheumatic fever.   he has a BMI of Body mass index is 29.86 kg/m.Marland Kitchen Filed Weights   03/27/22 1014  Weight: 99.9 kg    Family History  Problem Relation Age of Onset   Colon cancer Mother        patient reported that his mother didn't have colon cancer.   Liver cancer Mother    Cancer Father    Brain cancer Father        glioblastoma   Prostate cancer Neg Hx      Atrial Fibrillation Management history:  Previous antiarrhythmic drugs: amiodarone  Previous cardioversions: 2014, 06/23/21, 03/20/22 Previous ablations: none CHADS2VASC score: 5 Anticoagulation history: Xarelto    Past Medical History:  Diagnosis Date   Ascending aortic aneurysm ([place]) 09/02/2021   Atrial  fibrillation, currently in sinus rhythm    Atrial myxoma    Cervical osteoarthritis    History of TIA (transient ischemic attack)    History of tinnitus    Hyperlipidemia    Hypertension    Melanoma ([place])    per Dr. Allyson Sabal, local excision, no chemo/no rady tx.    Past Surgical History:  Procedure Laterality Date   ANKLE ARTHROSCOPY WITH ARTHRODESIS Left 08/26/2015   Procedure: LEFT ANKLE ARTHROSCOPY TIBIOTALAR ARTHRODESIS;  Surgeon: [name], MD;  Location: [place];  Service: Orthopedics;  Laterality: Left;   APPENDECTOMY     BACK SURGERY     CARDIOVERSION N/A 06/25/2013   Procedure: CARDIOVERSION;  Surgeon: [name], MD;  Location: [facility] ENDOSCOPY;  Service: Cardiovascular;  Laterality: N/A;   CARDIOVERSION N/A 06/23/2021   Procedure: CARDIOVERSION;  Surgeon: Thayer Headings, MD;  Location: [facility] ENDOSCOPY;  Service: Cardiovascular;  Laterality: N/A;   CARDIOVERSION N/A 03/20/2022   Procedure: CARDIOVERSION;  Surgeon: Skeet Latch, MD;  Location: [place];  Service: Cardiovascular;  Laterality: N/A;   EXCISION OF ATRIAL MYXOMA Left 04/09/2013   Procedure: EXCISION OF ATRIAL MYXOMA;  Surgeon: [name], MD;  Location: [place];  Service: Open Heart Surgery;  Laterality: Left;   INTRAOPERATIVE TRANSESOPHAGEAL ECHOCARDIOGRAM N/A 04/09/2013   Procedure: INTRAOPERATIVE TRANSESOPHAGEAL ECHOCARDIOGRAM;  Surgeon: [name], MD;  Location: [place];  Service: Open Heart Surgery;  Laterality: N/A;   LEFT HEART CATHETERIZATION WITH CORONARY ANGIOGRAM N/A 04/07/2013   Procedure: LEFT HEART CATHETERIZATION WITH CORONARY ANGIOGRAM;  Surgeon: Legrand Como  Ree Kida, MD;  Location: [facility] CATH LAB;  Service: Cardiovascular;  Laterality: N/A;   NASAL SEPTUM SURGERY     NOSE SURGERY     ROTATOR CUFF REPAIR     both shoulders    Current Outpatient Medications  Medication Sig Dispense Refill   acetaminophen (TYLENOL) 500 MG tablet Take 1,000 mg by mouth every 6  (six) hours as needed for moderate pain.     atorvastatin (LIPITOR) 80 MG tablet Take 0.5 tablets (40 mg total) by mouth daily. 45 tablet 3   hydrochlorothiazide (HYDRODIURIL) 25 MG tablet Take 1 tablet (25 mg total) by mouth daily. 90 tablet 3   levothyroxine (SYNTHROID) 25 MCG tablet Take 1 tablet (25 mcg total) by mouth daily. 90 tablet 3   metoprolol tartrate (LOPRESSOR) 25 MG tablet Take 1 tablet (25 mg total) by mouth 2 (two) times daily. 180 tablet 3   Multiple Vitamin (MULTIVITAMIN WITH MINERALS) TABS tablet Take 1 tablet by mouth daily.     potassium chloride SA (KLOR-CON) 20 MEQ tablet TAKE 1 TABLET BY MOUTH 2 TIMES DAILY. 180 tablet 3   saw palmetto 500 MG capsule Take 500 mg by mouth 2 (two) times daily.     XARELTO 20 MG TABS tablet TAKE 1 TABLET BY MOUTH DAILY WITH SUPPER. 30 tablet 5   No current facility-administered medications for this encounter.    No Known Allergies  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   Social Determinants of Health   Financial Resource Strain: Low Risk  (11/09/2021)   Overall Financial Resource Strain (CARDIA)    Difficulty of Paying Living Expenses: Not hard at all  Food Insecurity: No Food Insecurity (11/09/2021)   Hunger Vital Sign    Worried About Running Out of Food in the Last Year: Never true    [place] in the Last Year: Never true  Transportation Needs: No Transportation Needs  (11/09/2021)   PRAPARE - Hydrologist (Medical): No    Lack of Transportation (Non-Medical): No  Physical Activity: Sufficiently Active (11/09/2021)   Exercise Vital Sign    Days of Exercise per Week: 5 days    Minutes of Exercise per Session: 30 min  Stress: No Stress Concern Present (11/09/2021)   [place]    Feeling of Stress : Not at all  Social Connections: Socially Integrated (11/09/2021)   Social Connection and Isolation Panel [NHANES]    Frequency of Communication with Friends and Family: More than three times a week    Frequency of Social Gatherings with Friends and Family: More than three times a week    Attends Religious Services: More than 4 times per year    Active Member of Clubs or  Organizations: Yes    Attends Music therapist: More than 4 times per year    Marital Status: Married  Human resources officer Violence: Not At Risk (11/09/2021)   Humiliation, Afraid, Rape, and Kick questionnaire    Fear of Current or Ex-Partner: No    Emotionally Abused: No    Physically Abused: No    Sexually Abused: No     ROS- All systems are reviewed and negative except as per the HPI above.  Physical Exam: Vitals:   03/27/22 1014  BP: 130/80  Pulse: (!) 117  Weight: 99.9 kg  Height: 6' (1.829 m)    GEN- The patient is a well appearing elderly male, alert and oriented x 3 today.   Head- normocephalic, atraumatic Eyes-  Sclera clear, conjunctiva pink Ears- hearing intact Oropharynx- clear Neck- supple  Lungs- Clear to ausculation bilaterally, normal work of breathing Heart- irregular rate and rhythm, no murmurs, rubs or gallops  GI- soft, NT, ND, + BS Extremities- no clubbing, cyanosis, or edema MS- no significant deformity or atrophy Skin- no rash or lesion Psych- euthymic mood, full affect Neuro- strength and sensation are intact  Wt Readings from Last 3 Encounters:   03/27/22 99.9 kg  03/20/22 99.3 kg  03/15/22 99.7 kg    EKG today demonstrates  Afib Vent. rate 117 BPM PR interval * ms QRS duration 82 ms QT/QTcB 340/474 ms  Echo 06/27/21 demonstrated   1. Left ventricular ejection fraction, by estimation, is 50 to 55%. The  left ventricle has low normal function. The left ventricle demonstrates  global hypokinesis. The left ventricular internal cavity size was mildly  dilated. Left ventricular diastolic parameters are indeterminate.   2. Right ventricular systolic function is normal. The right ventricular  size is normal.   3. Left atrial size was severely dilated.   4. Right atrial size was severely dilated.   5. The mitral valve is grossly normal. Mild mitral valve regurgitation.  No evidence of mitral stenosis.   6. The aortic valve is normal in structure. Aortic valve regurgitation is  not visualized. No aortic stenosis is present.   7. Aortic dilatation noted. There is borderline dilatation of the  ascending aorta, measuring 38 mm.   Comparison(s): EF 50%.   Epic records are reviewed at length today  CHA2DS2-VASc Score = 5  The patient's score is based upon: CHF History: 0 HTN History: 1 Diabetes History: 0 Stroke History: 2 (TIA) Vascular Disease History: 0 Age Score: 2 Gender Score: 0       ASSESSMENT AND PLAN: 1. Persistent Atrial Fibrillation (ICD10:  I48.19) The patient's CHA2DS2-VASc score is 5, indicating a 7.2% annual risk of stroke.   Patient is s/p DCCV 03/20/22 with very quick return of afib.  We discussed rhythm control options today. Long term, he would like to pursue ablation with Dr Curt Bears (discussed 07/15/21). Short term, will start flecainide 50 mg BID and plan for repeat DCCV if he does not chemically convert. Will plan for TST once he is back in SR.  Continue Lopressor 25 mg BID Continue Xarelto 20 mg daily  2. Secondary Hypercoagulable State (ICD10:  D68.69) The patient is at significant risk for  stroke/thromboembolism based upon his CHA2DS2-VASc Score of 5.  Continue Rivaroxaban (Xarelto).   3. HTN Stable, no changes today.   Follow up in the AF clinic later this week for ECG after flecainide start.    [facility] [place],  [place] [address] [phone] 03/27/2022 10:41 AM

## 2022-03-31 ENCOUNTER — Ambulatory Visit (HOSPITAL_COMMUNITY)
Admission: RE | Admit: 2022-03-31 | Discharge: 2022-03-31 | Disposition: A | Discharge status: Home / Self Care | Payer: Medicare HMO | Source: Ambulatory Visit | Attending: Physician Assistant | Admitting: Physician Assistant

## 2022-03-31 VITALS — HR 87

## 2022-03-31 DIAGNOSIS — I4891 Unspecified atrial fibrillation: Secondary | ICD-10-CM

## 2022-03-31 DIAGNOSIS — I4819 Other persistent atrial fibrillation: Secondary | ICD-10-CM | POA: Diagnosis not present

## 2022-03-31 LAB — CBC
HCT: 36.6 % — ABNORMAL LOW (ref 39.0–52.0)
Hemoglobin: 12.4 g/dL — ABNORMAL LOW (ref 13.0–17.0)
MCH: 32.4 pg (ref 26.0–34.0)
MCHC: 33.9 g/dL (ref 30.0–36.0)
MCV: 95.6 fL (ref 80.0–100.0)
Platelets: 246 10*3/uL (ref 150–400)
RBC: 3.83 MIL/uL — ABNORMAL LOW (ref 4.22–5.81)
RDW: 15.2 % (ref 11.5–15.5)
WBC: 5.7 10*3/uL (ref 4.0–10.5)
nRBC: 0 % (ref 0.0–0.2)

## 2022-03-31 LAB — BASIC METABOLIC PANEL
Anion gap: 8 (ref 5–15)
BUN: 13 mg/dL (ref 8–23)
CO2: 27 mmol/L (ref 22–32)
Calcium: 8.5 mg/dL — ABNORMAL LOW (ref 8.9–10.3)
Chloride: 104 mmol/L (ref 98–111)
Creatinine, Ser: 1.07 mg/dL (ref 0.61–1.24)
GFR, Estimated: >60 mL/min (ref >60)
Glucose, Bld: 104 mg/dL — ABNORMAL HIGH (ref 70–99)
Potassium: 3.2 mmol/L — ABNORMAL LOW (ref 3.5–5.1)
Sodium: 139 mmol/L (ref 135–145)

## 2022-03-31 MED ORDER — POTASSIUM CHLORIDE CRYS ER 20 MEQ PO TBCR: 20.0000 meq | EXTENDED_RELEASE_TABLET | Freq: Three times a day (TID) | ORAL | 3 refills | Status: DC

## 2022-03-31 MED ORDER — FLECAINIDE ACETATE 100 MG PO TABS: 100.0000 mg | ORAL_TABLET | Freq: Two times a day (BID) | ORAL | 3 refills | Status: DC

## 2022-03-31 NOTE — Progress Notes (Signed)
Patient returns for ECG after starting flecainide. ECG shows afib HR 87, QRS 90, QTc 464. Will increase dose to 100 mg BID and arrange for repeat DCCV. F/u in AF clinic post DCCV and then with Dr Curt Bears as scheduled.

## 2022-03-31 NOTE — Addendum Note (Signed)
Encounter addended by: Juluis Mire, RN on: 03/31/2022 3:51 PM  Actions taken: Order list changed

## 2022-03-31 NOTE — Patient Instructions (Signed)
Increase flecainide to '100mg'$  twice a day   Cardioversion scheduled for Wednesday, October 4th  - Arrive at the [facility] and go to admitting at 930am  - Do not eat or drink anything after midnight the night prior to your procedure.  - Take all your morning medication (except diabetic medications) with a sip of water prior to arrival.  - You will not be able to drive home after your procedure.  - Do NOT miss any doses of your blood thinner - if you should miss a dose please notify our office immediately.  - If you feel as if you go back into normal rhythm prior to scheduled cardioversion, please notify our office immediately. If your procedure is canceled in the cardioversion suite you will be charged a cancellation fee.

## 2022-04-04 ENCOUNTER — Other Ambulatory Visit (INDEPENDENT_AMBULATORY_CARE_PROVIDER_SITE_OTHER): Payer: Medicare HMO

## 2022-04-04 ENCOUNTER — Telehealth: Payer: Self-pay | Admitting: Family Medicine

## 2022-04-04 DIAGNOSIS — E039 Hypothyroidism, unspecified: Secondary | ICD-10-CM

## 2022-04-04 LAB — TSH: TSH: 2.91 u[IU]/mL (ref 0.35–5.50)

## 2022-04-04 MED ORDER — LEVOTHYROXINE SODIUM 25 MCG PO TABS: 25.0000 ug | ORAL_TABLET | Freq: Every day | ORAL | 1 refills | Status: DC

## 2022-04-04 NOTE — Telephone Encounter (Signed)
Called patient and scheduled lab appt for today at 1:00 pm to have TSH drawn. Do you want to refill now or wait for results?

## 2022-04-04 NOTE — Telephone Encounter (Signed)
Patient stated that he has one pill left on levothyroxine (SYNTHROID) 25 MCG tablet and wanted to know if he needs to have blood work done before the refill. Call back number [phone].

## 2022-04-04 NOTE — Addendum Note (Signed)
Addended by: Tonia Ghent on: 04/04/2022 01:43 PM   Modules accepted: Orders

## 2022-04-04 NOTE — Telephone Encounter (Signed)
I sent the rx.  I don't want him to run out and we can use the new rx even if he have to adjust his dose. Thanks.

## 2022-04-04 NOTE — Telephone Encounter (Signed)
Left message on patients VM that rx was sent.

## 2022-04-06 ENCOUNTER — Telehealth: Payer: Self-pay | Admitting: Family Medicine

## 2022-04-06 NOTE — Telephone Encounter (Signed)
Pt called asking for advice on what to do about low red blood cells? Pt stated he talked to [place] recently about the issue but only spoke briefly. Pt wants to know what could he do or there anything he could do? Call back # [phone]

## 2022-04-06 NOTE — Telephone Encounter (Signed)
I wouldn't do anything differently yet.  Mild anemia on most recent check with plan per cardiology to recheck it after DCCV.  If it persists, then we can work that up.  Thanks.

## 2022-04-07 NOTE — Telephone Encounter (Signed)
Patient has been advised and verbalized understanding.

## 2022-04-10 DIAGNOSIS — H9312 Tinnitus, left ear: Secondary | ICD-10-CM | POA: Diagnosis not present

## 2022-04-10 DIAGNOSIS — H9122 Sudden idiopathic hearing loss, left ear: Secondary | ICD-10-CM | POA: Diagnosis not present

## 2022-04-12 ENCOUNTER — Ambulatory Visit (HOSPITAL_COMMUNITY): Payer: Medicare HMO | Admitting: Certified Registered"

## 2022-04-12 ENCOUNTER — Other Ambulatory Visit: Payer: Self-pay

## 2022-04-12 ENCOUNTER — Encounter (HOSPITAL_COMMUNITY): Payer: Self-pay | Admitting: Internal Medicine

## 2022-04-12 ENCOUNTER — Ambulatory Visit (HOSPITAL_BASED_OUTPATIENT_CLINIC_OR_DEPARTMENT_OTHER): Payer: Medicare HMO | Admitting: Certified Registered"

## 2022-04-12 ENCOUNTER — Encounter (HOSPITAL_COMMUNITY)
Admission: RE | Disposition: A | Discharge status: Home / Self Care | Payer: Self-pay | Source: Home / Self Care | Attending: Internal Medicine

## 2022-04-12 ENCOUNTER — Ambulatory Visit (HOSPITAL_COMMUNITY)
Admission: RE | Admit: 2022-04-12 | Discharge: 2022-04-12 | Disposition: A | Discharge status: Home / Self Care | Payer: Medicare HMO | Attending: Internal Medicine | Admitting: Internal Medicine

## 2022-04-12 DIAGNOSIS — E039 Hypothyroidism, unspecified: Secondary | ICD-10-CM | POA: Insufficient documentation

## 2022-04-12 DIAGNOSIS — I10 Essential (primary) hypertension: Secondary | ICD-10-CM | POA: Insufficient documentation

## 2022-04-12 DIAGNOSIS — Z87891 Personal history of nicotine dependence: Secondary | ICD-10-CM | POA: Diagnosis not present

## 2022-04-12 DIAGNOSIS — I4891 Unspecified atrial fibrillation: Secondary | ICD-10-CM | POA: Diagnosis not present

## 2022-04-12 DIAGNOSIS — M1990 Unspecified osteoarthritis, unspecified site: Secondary | ICD-10-CM | POA: Diagnosis not present

## 2022-04-12 DIAGNOSIS — Z7901 Long term (current) use of anticoagulants: Secondary | ICD-10-CM | POA: Insufficient documentation

## 2022-04-12 DIAGNOSIS — I252 Old myocardial infarction: Secondary | ICD-10-CM | POA: Diagnosis not present

## 2022-04-12 DIAGNOSIS — H9120 Sudden idiopathic hearing loss, unspecified ear: Secondary | ICD-10-CM

## 2022-04-12 DIAGNOSIS — Z79899 Other long term (current) drug therapy: Secondary | ICD-10-CM | POA: Diagnosis not present

## 2022-04-12 DIAGNOSIS — D6869 Other thrombophilia: Secondary | ICD-10-CM | POA: Insufficient documentation

## 2022-04-12 DIAGNOSIS — I4819 Other persistent atrial fibrillation: Secondary | ICD-10-CM | POA: Insufficient documentation

## 2022-04-12 HISTORY — PX: CARDIOVERSION: SHX1299

## 2022-04-12 LAB — POCT I-STAT, CHEM 8
BUN: 14 mg/dL (ref 8–23)
Calcium, Ion: 1.17 mmol/L (ref 1.15–1.40)
Chloride: 101 mmol/L (ref 98–111)
Creatinine, Ser: 1 mg/dL (ref 0.61–1.24)
Glucose, Bld: 123 mg/dL — ABNORMAL HIGH (ref 70–99)
HCT: 38 % — ABNORMAL LOW (ref 39.0–52.0)
Hemoglobin: 12.9 g/dL — ABNORMAL LOW (ref 13.0–17.0)
Potassium: 3.9 mmol/L (ref 3.5–5.1)
Sodium: 140 mmol/L (ref 135–145)
TCO2: 30 mmol/L (ref 22–32)

## 2022-04-12 SURGERY — CARDIOVERSION
Anesthesia: General

## 2022-04-12 MED ORDER — SODIUM CHLORIDE 0.9 % IV SOLN: INTRAVENOUS | Status: DC

## 2022-04-12 MED ORDER — LIDOCAINE 2% (20 MG/ML) 5 ML SYRINGE
INTRAMUSCULAR | Status: DC | PRN
  Administered 2022-04-12: 20 mg via INTRAVENOUS

## 2022-04-12 MED ORDER — PROPOFOL 10 MG/ML IV BOLUS
INTRAVENOUS | Status: DC | PRN
  Administered 2022-04-12: 70 mg via INTRAVENOUS

## 2022-04-12 MED ORDER — LACTATED RINGERS IV SOLN: INTRAVENOUS | Status: DC | PRN

## 2022-04-12 NOTE — Anesthesia Preprocedure Evaluation (Signed)
Anesthesia Evaluation  Patient identified by MRN, date of birth, ID band Patient awake    Reviewed: Allergy & Precautions, NPO status , Patient's Chart, lab work & pertinent test results, reviewed documented beta blocker date and time   Airway Mallampati: III  TM Distance: >3 FB Neck ROM: Full    Dental  (+) Teeth Intact, Dental Advisory Given   Pulmonary neg pulmonary ROS, former smoker,    Pulmonary exam normal breath sounds clear to auscultation       Cardiovascular hypertension, Pt. on medications and Pt. on home beta blockers + Past MI  Normal cardiovascular exam+ dysrhythmias (on xarelto) Atrial Fibrillation  Rhythm:Regular Rate:Normal  TTE 2022 1. Left ventricular ejection fraction, by estimation, is 50 to 55%. The  left ventricle has low normal function. The left ventricle demonstrates  global hypokinesis. The left ventricular internal cavity size was mildly  dilated. Left ventricular diastolic  parameters are indeterminate.  2. Right ventricular systolic function is normal. The right ventricular  size is normal.  3. Left atrial size was severely dilated.  4. Right atrial size was severely dilated.  5. The mitral valve is grossly normal. Mild mitral valve regurgitation.  No evidence of mitral stenosis.  6. The aortic valve is normal in structure. Aortic valve regurgitation is  not visualized. No aortic stenosis is present.  7. Aortic dilatation noted. There is borderline dilatation of the  ascending aorta, measuring 38 mm.    Neuro/Psych TIAnegative psych ROS   GI/Hepatic negative GI ROS, Neg liver ROS,   Endo/Other  Hypothyroidism   Renal/GU negative Renal ROS  negative genitourinary   Musculoskeletal  (+) Arthritis ,   Abdominal   Peds  Hematology negative hematology ROS (+)   Anesthesia Other Findings   Reproductive/Obstetrics                             Anesthesia  Physical  Anesthesia Plan  ASA: 3  Anesthesia Plan: General   Post-op Pain Management:    Induction: Intravenous  PONV Risk Score and Plan: 2 and Propofol infusion and Treatment may vary due to age or medical condition  Airway Management Planned: Natural Airway and Mask  Additional Equipment:   Intra-op Plan:   Post-operative Plan:   Informed Consent: I have reviewed the patients History and Physical, chart, labs and discussed the procedure including the risks, benefits and alternatives for the proposed anesthesia with the patient or authorized representative who has indicated his/her understanding and acceptance.     Dental advisory given  Plan Discussed with: CRNA  Anesthesia Plan Comments:         Anesthesia Quick Evaluation

## 2022-04-12 NOTE — Interval H&P Note (Signed)
History and Physical Interval Note:  04/12/2022 9:47 AM  Matthew Sullivan  has presented today for surgery, with the diagnosis of atrial fibrillation.  The various methods of treatment have been discussed with the patient and family. After consideration of risks, benefits and other options for treatment, the patient has consented to  Procedure(s): CARDIOVERSION (N/A) as a surgical intervention.  The patient's history has been reviewed, patient examined, no change in status, stable for surgery.  I have reviewed the patient's chart and labs.  Questions were answered to the patient's satisfaction.     Elouise Munroe

## 2022-04-12 NOTE — Transfer of Care (Signed)
Immediate Anesthesia Transfer of Care Note  Patient: Matthew Sullivan  Procedure(s) Performed: CARDIOVERSION  Patient Location: Endoscopy Unit  Anesthesia Type:General  Level of Consciousness: awake, alert  and oriented  Airway & Oxygen Therapy: Patient Spontanous Breathing  Post-op Assessment: Report given to RN and Post -op Vital signs reviewed and stable  Post vital signs: Reviewed and stable  Last Vitals:  Vitals Value Taken Time  BP 101/60   Temp    Pulse 49   Resp 16 04/12/22 1007  SpO2 100   Vitals shown include unvalidated device data.  Last Pain:  Vitals:   04/12/22 0936  TempSrc: Temporal  PainSc: 0-No pain         Complications: No notable events documented.

## 2022-04-12 NOTE — Discharge Instructions (Signed)

## 2022-04-12 NOTE — Anesthesia Procedure Notes (Signed)
Procedure Name: General with mask airway Date/Time: 04/12/2022 9:56 AM  Performed by: Anastasio Auerbach, CRNAPre-anesthesia Checklist: Patient identified, Emergency Drugs available, Suction available, Patient being monitored and Timeout performed Patient Re-evaluated:Patient Re-evaluated prior to induction Oxygen Delivery Method: Ambu bag Preoxygenation: Pre-oxygenation with 100% oxygen Induction Type: IV induction Ventilation: Mask ventilation without difficulty

## 2022-04-12 NOTE — CV Procedure (Signed)
Procedure: Electrical Cardioversion Indications:  Atrial Fibrillation  Procedure Details:  Consent: Risks of procedure as well as the alternatives and risks of each were explained to the (patient/caregiver).  Consent for procedure obtained.  Time Out: Verified patient identification, verified procedure, site/side was marked, verified correct patient position, special equipment/implants available, medications/allergies/relevent history reviewed, required imaging and test results available. PERFORMED.  Patient placed on cardiac monitor, pulse oximetry, supplemental oxygen as necessary.  Sedation given:  propofol per anesthesia Pacer pads placed anterior and posterior chest.  Cardioverted 1 time(s).  Cardioversion with synchronized biphasic 120J shock.  Evaluation: Findings: Post procedure EKG shows:  sinus bradycardia Complications: None Patient did tolerate procedure well.  Time Spent Directly with the Patient:  30 minutes   Elouise Munroe 04/12/2022, 10:03 AM

## 2022-04-13 NOTE — Anesthesia Postprocedure Evaluation (Signed)
Anesthesia Post Note  Patient: Matthew Sullivan  Procedure(s) Performed: CARDIOVERSION     Patient location during evaluation: PACU Anesthesia Type: General Level of consciousness: awake and alert Pain management: pain level controlled Vital Signs Assessment: post-procedure vital signs reviewed and stable Respiratory status: spontaneous breathing, nonlabored ventilation, respiratory function stable and patient connected to nasal cannula oxygen Cardiovascular status: blood pressure returned to baseline and stable Postop Assessment: no apparent nausea or vomiting Anesthetic complications: no   No notable events documented.  Last Vitals:  Vitals:   04/12/22 1010 04/12/22 1030  BP: (!) 95/58 107/65  Pulse: (!) 49 (!) 52  Resp: (!) 22 20  Temp:    SpO2: 99% 94%    Last Pain:  Vitals:   04/12/22 1030  TempSrc:   PainSc: 0-No pain                 Tiajuana Amass

## 2022-04-16 ENCOUNTER — Encounter (HOSPITAL_COMMUNITY): Payer: Self-pay | Admitting: Internal Medicine

## 2022-04-18 DIAGNOSIS — L814 Other melanin hyperpigmentation: Secondary | ICD-10-CM | POA: Diagnosis not present

## 2022-04-18 DIAGNOSIS — L570 Actinic keratosis: Secondary | ICD-10-CM | POA: Diagnosis not present

## 2022-04-18 DIAGNOSIS — D225 Melanocytic nevi of trunk: Secondary | ICD-10-CM | POA: Diagnosis not present

## 2022-04-18 DIAGNOSIS — Z85820 Personal history of malignant melanoma of skin: Secondary | ICD-10-CM | POA: Diagnosis not present

## 2022-04-18 DIAGNOSIS — L821 Other seborrheic keratosis: Secondary | ICD-10-CM | POA: Diagnosis not present

## 2022-04-18 DIAGNOSIS — Z08 Encounter for follow-up examination after completed treatment for malignant neoplasm: Secondary | ICD-10-CM | POA: Diagnosis not present

## 2022-04-18 DIAGNOSIS — Z85828 Personal history of other malignant neoplasm of skin: Secondary | ICD-10-CM | POA: Diagnosis not present

## 2022-04-20 ENCOUNTER — Other Ambulatory Visit: Payer: Self-pay | Admitting: Cardiovascular Disease

## 2022-04-20 ENCOUNTER — Ambulatory Visit (HOSPITAL_COMMUNITY)
Admission: RE | Admit: 2022-04-20 | Discharge: 2022-04-20 | Disposition: A | Discharge status: Home / Self Care | Payer: Medicare HMO | Source: Ambulatory Visit | Attending: Physician Assistant | Admitting: Physician Assistant

## 2022-04-20 ENCOUNTER — Encounter (HOSPITAL_COMMUNITY): Payer: Self-pay | Admitting: Physician Assistant

## 2022-04-20 VITALS — BP 128/84 | HR 61 | Ht 72.0 in | Wt 222.0 lb

## 2022-04-20 DIAGNOSIS — Z8673 Personal history of transient ischemic attack (TIA), and cerebral infarction without residual deficits: Secondary | ICD-10-CM | POA: Diagnosis not present

## 2022-04-20 DIAGNOSIS — Z7901 Long term (current) use of anticoagulants: Secondary | ICD-10-CM | POA: Diagnosis not present

## 2022-04-20 DIAGNOSIS — E785 Hyperlipidemia, unspecified: Secondary | ICD-10-CM | POA: Insufficient documentation

## 2022-04-20 DIAGNOSIS — D6869 Other thrombophilia: Secondary | ICD-10-CM

## 2022-04-20 DIAGNOSIS — I10 Essential (primary) hypertension: Secondary | ICD-10-CM | POA: Insufficient documentation

## 2022-04-20 DIAGNOSIS — I4819 Other persistent atrial fibrillation: Secondary | ICD-10-CM | POA: Diagnosis not present

## 2022-04-20 NOTE — Progress Notes (Signed)
Primary Care Physician: Tonia Ghent, MD Primary Cardiologist: Dr Oval Linsey  Primary Electrophysiologist: Dr Curt Bears Referring Physician: Dr Doristine Mango Matthew Sullivan is a 76 y.o. male with a history of HTN, HLD, atrial myxoma s/p excision 2013, TIA, atrial fibrillation who presents for follow up in the [facility].  He had postoperative atrial fibrillation and had a cardioversion in 2014.  He was started on amiodarone which was subsequently discontinued. Patient is on Xarelto for a CHADS2VASC score of 5. Patient underwent DCCV on 03/20/22 but returned to afib before leaving the hospital.  On follow up today, patient is s/p DCCV on 04/12/22 which lasted at most 48 hours. He is in rate controlled afib today, no specific triggers that he can identify. He denies significant snoring or daytime somnolence.   Today, he denies symptoms of palpitations, chest pain, shortness of breath, orthopnea, PND, lower extremity edema, dizziness, presyncope, syncope, snoring, daytime somnolence, bleeding, or neurologic sequela. The patient is tolerating medications without difficulties and is otherwise without complaint today.    Atrial Fibrillation Risk Factors:  he does not have symptoms or diagnosis of sleep apnea. he does not have a history of rheumatic fever.   he has a BMI of Body mass index is 30.11 kg/m.Marland Kitchen Filed Weights   04/20/22 0926  Weight: 100.7 kg    Family History  Problem Relation Age of Onset   Colon cancer Mother        patient reported that his mother didn't have colon cancer.   Liver cancer Mother    Cancer Father    Brain cancer Father        glioblastoma   Prostate cancer Neg Hx      Atrial Fibrillation Management history:  Previous antiarrhythmic drugs: amiodarone, flecainide  Previous cardioversions: 2014, 06/23/21, 03/20/22, 04/12/22 Previous ablations: none CHADS2VASC score: 5 Anticoagulation history: Xarelto    Past Medical  History:  Diagnosis Date   Ascending aortic aneurysm ([place]) 09/02/2021   Atrial fibrillation, currently in sinus rhythm    Atrial myxoma    Cervical osteoarthritis    History of TIA (transient ischemic attack)    History of tinnitus    Hyperlipidemia    Hypertension    Melanoma ([place])    per Dr. Allyson Sabal, local excision, no chemo/no rady tx.    Past Surgical History:  Procedure Laterality Date   ANKLE ARTHROSCOPY WITH ARTHRODESIS Left 08/26/2015   Procedure: LEFT ANKLE ARTHROSCOPY TIBIOTALAR ARTHRODESIS;  Surgeon: [name], MD;  Location: [place];  Service: Orthopedics;  Laterality: Left;   APPENDECTOMY     BACK SURGERY     CARDIOVERSION N/A 06/25/2013   Procedure: CARDIOVERSION;  Surgeon: [name], MD;  Location: [place];  Service: Cardiovascular;  Laterality: N/A;   CARDIOVERSION N/A 06/23/2021   Procedure: CARDIOVERSION;  Surgeon: Thayer Headings, MD;  Location[facility] ENDOSCOPY;  Service: Cardiovascular;  Laterality: N/A;   CARDIOVERSION N/A 03/20/2022   Procedure: CARDIOVERSION;  Surgeon: Skeet Latch, MD;  Location: [facility] ENDOSCOPY;  Service: Cardiovascular;  Laterality: N/A;   CARDIOVERSION N/A 04/12/2022   Procedure: CARDIOVERSION;  Surgeon: Elouise Munroe, MD;  Location: [place];  Service: Cardiovascular;  Laterality: N/A;   EXCISION OF ATRIAL MYXOMA Left 04/09/2013   Procedure: EXCISION OF ATRIAL MYXOMA;  Surgeon: [name], MD;  Location: [place];  Service: Open Heart Surgery;  Laterality: Left;   INTRAOPERATIVE TRANSESOPHAGEAL ECHOCARDIOGRAM N/A 04/09/2013   Procedure: INTRAOPERATIVE TRANSESOPHAGEAL ECHOCARDIOGRAM;  Surgeon: [name], MD;  Location: [place];  Service: Open Heart Surgery;  Laterality: N/A;   LEFT HEART CATHETERIZATION WITH CORONARY ANGIOGRAM N/A 04/07/2013   Procedure: LEFT HEART CATHETERIZATION WITH CORONARY ANGIOGRAM;  Surgeon: [name], MD;  Location: [facility] CATH LAB;  Service: Cardiovascular;   Laterality: N/A;   NASAL SEPTUM SURGERY     NOSE SURGERY     ROTATOR CUFF REPAIR     both shoulders    Current Outpatient Medications  Medication Sig Dispense Refill   acetaminophen (TYLENOL) 500 MG tablet Take 1,000 mg by mouth every 6 (six) hours as needed for moderate pain.     atorvastatin (LIPITOR) 80 MG tablet Take 0.5 tablets (40 mg total) by mouth daily. 45 tablet 3   flecainide (TAMBOCOR) 100 MG tablet Take 1 tablet (100 mg total) by mouth 2 (two) times daily. 60 tablet 3   hydrochlorothiazide (HYDRODIURIL) 25 MG tablet Take 1 tablet (25 mg total) by mouth daily. 90 tablet 3   levothyroxine (SYNTHROID) 25 MCG tablet Take 1 tablet (25 mcg total) by mouth daily. 90 tablet 1   metoprolol tartrate (LOPRESSOR) 25 MG tablet Take 1 tablet (25 mg total) by mouth 2 (two) times daily. 180 tablet 3   Multiple Vitamin (MULTIVITAMIN WITH MINERALS) TABS tablet Take 1 tablet by mouth daily.     potassium chloride SA (KLOR-CON M) 20 MEQ tablet Take 1 tablet (20 mEq total) by mouth 3 (three) times daily. 180 tablet 3   saw palmetto 500 MG capsule Take 500 mg by mouth 2 (two) times daily.     XARELTO 20 MG TABS tablet TAKE 1 TABLET BY MOUTH DAILY WITH SUPPER. 30 tablet 5   No current facility-administered medications for this encounter.    No Known Allergies  Social History   Socioeconomic History   Marital status: Married    Spouse name: Not on file   Number of children: Not on file   Years of education: Not on file   Highest education level: Not on file  Occupational History   Not on file  Tobacco Use   Smoking status: Former    Types: Cigarettes    Quit date: 07/10/1977    Years since quitting: 44.8   Smokeless tobacco: Never   Tobacco comments:    Former smoker 04/20/22  Substance and Sexual Activity   Alcohol use: Yes    Comment: rare   Drug use: No   Sexual activity: Not on file  Other Topics Concern   Not on file  Social History Narrative   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    Social Determinants of Health   Financial Resource Strain: [place]  (11/09/2021)   Overall Financial Resource Strain (CARDIA)    Difficulty of Paying Living Expenses: Not hard at all  Food Insecurity: No [place] (11/09/2021)   Hunger Vital Sign    Worried About Running Out of Food in the Last Year: Never true    [place] in the Last Year: Never true  Transportation Needs: No Transportation Needs (11/09/2021)   PRAPARE - Hydrologist (Medical): No    Lack of Transportation (Non-Medical): No  Physical Activity: Sufficiently Active (11/09/2021)   Exercise Vital Sign    Days of Exercise per Week: 5 days    Minutes of Exercise per Session: 30 min  Stress: No Stress Concern Present (11/09/2021)   College  Feeling of Stress : Not at all  Social Connections: Socially Integrated (11/09/2021)   Social Connection and Isolation Panel [NHANES]    Frequency of Communication with Friends and Family: More than three times a week    Frequency of Social Gatherings with Friends and Family: More than three times a week    Attends Religious Services: More than 4 times per year    Active Member of [employer] or Organizations: Yes    Attends Music therapist: More than 4 times per year    Marital Status: Married  Human resources officer Violence: Not At Risk (11/09/2021)   Humiliation, Afraid, Rape, and Kick questionnaire    Fear of Current or Ex-Partner: No    Emotionally Abused: No    Physically Abused: No    Sexually Abused: No     ROS- All systems are reviewed and negative except as per the HPI above.  Physical Exam: Vitals:   04/20/22 0926  BP: 128/84  Pulse: 61  Weight: 100.7 kg  Height: 6' (1.829 m)     GEN- The patient is a well appearing elderly male, alert and oriented x 3 today.   HEENT-head normocephalic, atraumatic, sclera clear, conjunctiva pink, hearing intact, trachea  midline. Lungs- Clear to ausculation bilaterally, normal work of breathing Heart- irregular rate and rhythm, no murmurs, rubs or gallops  GI- soft, NT, ND, + BS Extremities- no clubbing, cyanosis, or edema MS- no significant deformity or atrophy Skin- no rash or lesion Psych- euthymic mood, full affect Neuro- strength and sensation are intact   Wt Readings from Last 3 Encounters:  04/20/22 100.7 kg  04/12/22 102.1 kg  03/27/22 99.9 kg    EKG today demonstrates  Afib Vent. rate 61 BPM PR interval * ms QRS duration 102 ms QT/QTcB 466/469 ms  Echo 06/27/21 demonstrated   1. Left ventricular ejection fraction, by estimation, is 50 to 55%. The  left ventricle has low normal function. The left ventricle demonstrates  global hypokinesis. The left ventricular internal cavity size was mildly  dilated. Left ventricular diastolic parameters are indeterminate.   2. Right ventricular systolic function is normal. The right ventricular  size is normal.   3. Left atrial size was severely dilated.   4. Right atrial size was severely dilated.   5. The mitral valve is grossly normal. Mild mitral valve regurgitation.  No evidence of mitral stenosis.   6. The aortic valve is normal in structure. Aortic valve regurgitation is  not visualized. No aortic stenosis is present.   7. Aortic dilatation noted. There is borderline dilatation of the  ascending aorta, measuring 38 mm.   Comparison(s): EF 50%.   Epic records are reviewed at length today  CHA2DS2-VASc Score = 5  The patient's score is based upon: CHF History: 0 HTN History: 1 Diabetes History: 0 Stroke History: 2 (TIA) Vascular Disease History: 0 Age Score: 2 Gender Score: 0       ASSESSMENT AND PLAN: 1. Persistent Atrial Fibrillation (ICD10:  I48.19) The patient's CHA2DS2-VASc score is 5, indicating a 7.2% annual risk of stroke.   Patient is s/p DCCV 04/12/22 with early return of afib again.  Continue flecainide 100 mg BID  for now. Has appointment with Dr Curt Bears to discuss ablation. With his h/o atrial myxoma surgery, he made need chronic AAD.  Continue Lopressor 25 mg BID Continue Xarelto 20 mg daily  2. Secondary Hypercoagulable State (ICD10:  D68.69) The patient is at significant risk for stroke/thromboembolism based upon his CHA2DS2-VASc  Score of 5.  Continue Rivaroxaban (Xarelto).   3. HTN Stable, no changes today.   Follow up with Dr Curt Bears as scheduled.    [facility] [address] [place], [place] [address] [phone] 04/20/2022 9:58 AM

## 2022-05-08 ENCOUNTER — Encounter: Payer: Self-pay | Admitting: Cardiology

## 2022-05-08 ENCOUNTER — Ambulatory Visit: Payer: Medicare HMO | Attending: Cardiology | Admitting: Cardiology

## 2022-05-08 ENCOUNTER — Telehealth: Payer: Self-pay

## 2022-05-08 VITALS — BP 118/76 | HR 76 | Ht 72.0 in | Wt 224.6 lb

## 2022-05-08 DIAGNOSIS — I4819 Other persistent atrial fibrillation: Secondary | ICD-10-CM | POA: Diagnosis not present

## 2022-05-08 DIAGNOSIS — D6869 Other thrombophilia: Secondary | ICD-10-CM | POA: Diagnosis not present

## 2022-05-08 DIAGNOSIS — I4891 Unspecified atrial fibrillation: Secondary | ICD-10-CM | POA: Diagnosis not present

## 2022-05-08 NOTE — Patient Instructions (Signed)
Medication Instructions:  Your physician recommends that you continue on your current medications as directed. Please refer to the Current Medication list given to you today.  *If you need a refill on your cardiac medications before your next appointment, please call your pharmacy*   Lab Work: None ordered.  If you have labs (blood work) drawn today and your tests are completely normal, you will receive your results only by: [place] (if you have MyChart) OR A paper copy in the mail If you have any lab test that is abnormal or we need to change your treatment, we will call you to review the results.   Testing/Procedures: Your physician has recommended that you have an ablation. Catheter ablation is a medical procedure used to treat some cardiac arrhythmias (irregular heartbeats). During catheter ablation, a long, thin, flexible tube is put into a blood vessel in your groin (upper thigh), or neck. This tube is called an ablation catheter. It is then guided to your heart through the blood vessel. Radio frequency waves destroy small areas of heart tissue where abnormal heartbeats may cause an arrhythmia to start. Please see the instruction sheet given to you today.    Follow-Up: At [facility], you and your health needs are our priority.  As part of our continuing mission to provide you with exceptional heart care, we have created designated Provider Care Teams.  These Care Teams include your primary Cardiologist (physician) and Advanced Practice Providers (APPs -  Physician Assistants and Nurse Practitioners) who all work together to provide you with the care you need, when you need it.  We recommend signing up for the patient portal called "MyChart".  Sign up information is provided on this After Visit Summary.  MyChart is used to connect with patients for Virtual Visits (Telemedicine).  Patients are able to view lab/test results, encounter notes, upcoming appointments, etc.   Non-urgent messages can be sent to your provider as well.   To learn more about what you can do with MyChart, go to [email or web address].    Your next appointment:   To be scheduled  Important Information About Sugar

## 2022-05-08 NOTE — Progress Notes (Signed)
Electrophysiology Office Note   Date:  05/08/2022   ID:  Matthew Sullivan, Matthew Sullivan 1945/09/24, MRN [id number]  PCP:  Tonia Ghent, MD  Cardiologist:  Nah[place] Primary Electrophysiologist:  Marybell Robards Meredith Leeds, MD    Chief Complaint: AF   History of Present Illness: Matthew Sullivan is a 76 y.o. male who is being seen today for the evaluation of AF at the request of Tonia Ghent, MD. Presenting today for electrophysiology evaluation.  He has a history significant for hypertension, hyperlipidemia, atrial myxoma, atrial fibrillation, TIA.  His myxoma was excised in 2013.  He had a catheterization at the time that showed no evidence of coronary disease.  He had postoperative atrial fibrillation post cardioversion 2014.  He was started on amiodarone which was subsequently discontinued.  He went back into atrial fibrillation at the end of November 2022.  He had symptoms of fatigue and palpitations.  He has had multiple cardioversions this year.  Today, denies symptoms of palpitations, chest pain, shortness of breath, orthopnea, PND, lower extremity edema, claudication, dizziness, presyncope, syncope, bleeding, or neurologic sequela. The patient is tolerating medications without difficulties.  Since being seen he has done well.  Unfortunately he has continued to have episodes of atrial fibrillation.  He feels mildly weak and fatigued.  He is able to do his daily activities, but he is mildly restricted.  He would prefer a rhythm control strategy.    Past Medical History:  Diagnosis Date   Ascending aortic aneurysm ([place]) 09/02/2021   Atrial fibrillation, currently in sinus rhythm    Atrial myxoma    Cervical osteoarthritis    History of TIA (transient ischemic attack)    History of tinnitus    Hyperlipidemia    Hypertension    Melanoma ([place])    per Dr. Allyson Sabal, local excision, no chemo/no rady tx.    Past Surgical History:  Procedure Laterality Date   ANKLE ARTHROSCOPY WITH ARTHRODESIS  Left 08/26/2015   Procedure: LEFT ANKLE ARTHROSCOPY TIBIOTALAR ARTHRODESIS;  Surgeon: [name], MD;  Location: [place];  Service: Orthopedics;  Laterality: Left;   APPENDECTOMY     BACK SURGERY     CARDIOVERSION N/A 06/25/2013   Procedure: CARDIOVERSION;  Surgeon: [name], MD;  Location: [place];  Service: Cardiovascular;  Laterality: N/A;   CARDIOVERSION N/A 06/23/2021   Procedure: CARDIOVERSION;  Surgeon: Thayer Headings, MD;  Location: [facility] ENDOSCOPY;  Service: Cardiovascular;  Laterality: N/A;   CARDIOVERSION N/A 03/20/2022   Procedure: CARDIOVERSION;  Surgeon: Skeet Latch, MD;  Location: [facility] ENDOSCOPY;  Service: Cardiovascular;  Laterality: N/A;   CARDIOVERSION N/A 04/12/2022   Procedure: CARDIOVERSION;  Surgeon: Elouise Munroe, MD;  Location: [place];  Service: Cardiovascular;  Laterality: N/A;   EXCISION OF ATRIAL MYXOMA Left 04/09/2013   Procedure: EXCISION OF ATRIAL MYXOMA;  Surgeon: [name], MD;  Location: [place];  Service: Open Heart Surgery;  Laterality: Left;   INTRAOPERATIVE TRANSESOPHAGEAL ECHOCARDIOGRAM N/A 04/09/2013   Procedure: INTRAOPERATIVE TRANSESOPHAGEAL ECHOCARDIOGRAM;  Surgeon: [name], MD;  Location: [place];  Service: Open Heart Surgery;  Laterality: N/A;   LEFT HEART CATHETERIZATION WITH CORONARY ANGIOGRAM N/A 04/07/2013   Procedure: LEFT HEART CATHETERIZATION WITH CORONARY ANGIOGRAM;  Surgeon: [name], MD;  Location[facility] C[facility] LAB;  Service: Cardiovascular;  Laterality: N/A;   NASAL SEPTUM SURGERY     NOSE SURGERY     ROTATOR CUFF REPAIR     both shoulders  Current Outpatient Medications  Medication Sig Dispense Refill   acetaminophen (TYLENOL) 500 MG tablet Take 1,000 mg by mouth every 6 (six) hours as needed for moderate pain.     atorvastatin (LIPITOR) 80 MG tablet TAKE 1/2 TABLET (40 MG TOTAL) BY MOUTH DAILY. 45 tablet 3   flecainide (TAMBOCOR) 100 MG tablet Take 1 tablet (100  mg total) by mouth 2 (two) times daily. 60 tablet 3   hydrochlorothiazide (HYDRODIURIL) 25 MG tablet Take 1 tablet (25 mg total) by mouth daily. 90 tablet 3   levothyroxine (SYNTHROID) 25 MCG tablet Take 1 tablet (25 mcg total) by mouth daily. 90 tablet 1   metoprolol tartrate (LOPRESSOR) 25 MG tablet Take 1 tablet (25 mg total) by mouth 2 (two) times daily. (Patient taking differently: Take 12.5 mg by mouth 2 (two) times daily.) 180 tablet 3   Multiple Vitamin (MULTIVITAMIN WITH MINERALS) TABS tablet Take 1 tablet by mouth daily.     potassium chloride SA (KLOR-CON M) 20 MEQ tablet Take 1 tablet (20 mEq total) by mouth 3 (three) times daily. 180 tablet 3   saw palmetto 500 MG capsule Take 500 mg by mouth 2 (two) times daily.     XARELTO 20 MG TABS tablet TAKE 1 TABLET BY MOUTH DAILY WITH SUPPER. 30 tablet 5   No current facility-administered medications for this visit.    Allergies:   Patient has no known allergies.   Social History:  The patient  reports that he quit smoking about 44 years ago. His smoking use included cigarettes. He has never used smokeless tobacco. He reports current alcohol use. He reports that he does not use drugs.   Family History:  The patient's family history includes Brain cancer in his father; Cancer in his father; Colon cancer in his mother; Liver cancer in his mother.   ROS:  Please see the history of present illness.   Otherwise, review of systems is positive for none.   All other systems are reviewed and negative.   PHYSICAL EXAM: VS:  BP 118/76   Pulse 76   Ht 6' (1.829 m)   Wt 224 lb 9.6 oz (101.9 kg)   SpO2 96%   BMI 30.46 kg/m  , BMI Body mass index is 30.46 kg/m. GEN: Well nourished, well developed, in no acute distress  HEENT: normal  Neck: no JVD, carotid bruits, or masses Cardiac: irregular; no murmurs, rubs, or gallops,no edema  Respiratory:  clear to auscultation bilaterally, normal work of breathing GI: soft, nontender, nondistended, +  BS MS: no deformity or atrophy  Skin: warm and dry Neuro:  Strength and sensation are intact Psych: euthymic mood, full affect  EKG:  EKG is ordered today. Personal review of the ekg ordered shows atrial fibrillation, rate 76  Recent Labs: 06/13/2021: Magnesium 2.2 11/09/2021: ALT 38 03/31/2022: Platelets 246 04/04/2022: TSH 2.91 04/12/2022: BUN 14; Creatinine, Ser 1.00; Hemoglobin 12.9; Potassium 3.9; Sodium 140    Lipid Panel     Component Value Date/Time   CHOL 139 11/09/2021 0839   CHOL 139 04/22/2019 1010   TRIG 139.0 11/09/2021 0839   HDL 42.90 11/09/2021 0839   HDL 44 04/22/2019 1010   CHOLHDL 3 11/09/2021 0839   VLDL 27.8 11/09/2021 0839   LDLCALC 68 11/09/2021 0839   LDLCALC 73 04/22/2019 1010     Wt Readings from Last 3 Encounters:  05/08/22 224 lb 9.6 oz (101.9 kg)  04/20/22 222 lb (100.7 kg)  04/12/22 225 lb (102.1 kg)  Other studies Reviewed: Additional studies/ records that were reviewed today include: TTE 06/27/21  Review of the above records today demonstrates:   1. Left ventricular ejection fraction, by estimation, is 50 to 55%. The  left ventricle has low normal function. The left ventricle demonstrates  global hypokinesis. The left ventricular internal cavity size was mildly  dilated. Left ventricular diastolic  parameters are indeterminate.   2. Right ventricular systolic function is normal. The right ventricular  size is normal.   3. Left atrial size was severely dilated.   4. Right atrial size was severely dilated.   5. The mitral valve is grossly normal. Mild mitral valve regurgitation.  No evidence of mitral stenosis.   6. The aortic valve is normal in structure. Aortic valve regurgitation is  not visualized. No aortic stenosis is present.   7. Aortic dilatation noted. There is borderline dilatation of the  ascending aorta, measuring 38 mm.   ASSESSMENT AND PLAN:  1.  Persistent atrial fibrillation: CHA2DS2-VASc of 3.  Currently on  Xarelto 20 mg daily, flecainide 100 mg twice daily.  He is unfortunately back in persistent atrial fibrillation, feeling mildly fatigued.  He would prefer a rhythm control strategy.  We Bartolo Montanye plan for ablation.  Risk, benefits, and alternatives to EP study and radiofrequency ablation for afib were also discussed in detail today. These risks include but are not limited to stroke, bleeding, vascular damage, tamponade, perforation, damage to the esophagus, lungs, and other structures, pulmonary vein stenosis, worsening renal function, and death. The patient understands these risk and wishes to proceed.  We Jalexus Brett therefore proceed with catheter ablation at the next available time.  Carto, ICE, anesthesia are requested for the procedure.  Jeremi Losito also obtain CT PV protocol prior to the procedure to exclude LAA thrombus and further evaluate atrial anatomy.   2.  Secondary hypercoagulable state: Currently on Xarelto for atrial fibrillation as above  3.  Hypertension: Currently well controlled  Current medicines are reviewed at length with the patient today.   The patient does not have concerns regarding his medicines.  The following changes were made today:  none  Labs/ tests ordered today include:  Orders Placed This Encounter  Procedures   EKG 12-Lead     Disposition:   FU with Shaylinn Hladik 3 months  Signed, Brailyn Killion Meredith Leeds, MD  05/08/2022 3:11 PM     [address] [place] [id number] [phone] (office) [phone] (fax)

## 2022-05-08 NOTE — Telephone Encounter (Signed)
Spoke with pt and went over CT/Ablation Instructions. Sent letters via [place].Marland KitchenMarland Kitchen

## 2022-05-08 NOTE — Addendum Note (Signed)
Addended by: Carylon Perches on: 05/08/2022 05:14 PM   Modules accepted: Orders

## 2022-05-23 ENCOUNTER — Other Ambulatory Visit: Payer: Self-pay | Admitting: Cardiovascular Disease

## 2022-05-29 ENCOUNTER — Other Ambulatory Visit: Payer: Self-pay | Admitting: Otolaryngology

## 2022-05-29 DIAGNOSIS — H9120 Sudden idiopathic hearing loss, unspecified ear: Secondary | ICD-10-CM

## 2022-06-07 ENCOUNTER — Ambulatory Visit
Admission: RE | Admit: 2022-06-07 | Discharge: 2022-06-07 | Disposition: A | Discharge status: Home / Self Care | Payer: Medicare HMO | Source: Ambulatory Visit | Attending: Otolaryngology | Admitting: Otolaryngology

## 2022-06-07 DIAGNOSIS — J320 Chronic maxillary sinusitis: Secondary | ICD-10-CM | POA: Diagnosis not present

## 2022-06-07 DIAGNOSIS — H9120 Sudden idiopathic hearing loss, unspecified ear: Secondary | ICD-10-CM

## 2022-06-07 DIAGNOSIS — H9190 Unspecified hearing loss, unspecified ear: Secondary | ICD-10-CM | POA: Diagnosis not present

## 2022-06-07 MED ORDER — GADOBENATE DIMEGLUMINE 529 MG/ML IV SOLN
20.0000 mL | Freq: Once | INTRAVENOUS | Status: AC | PRN
  Administered 2022-06-07: 20 mL via INTRAVENOUS

## 2022-06-12 ENCOUNTER — Telehealth: Payer: Self-pay | Admitting: Family Medicine

## 2022-06-12 NOTE — Telephone Encounter (Signed)
D/w pt at wife's visit.  Recently with abd protrusion with straining, in the midline. No pain.  Going on for a few weeks.  It feels and looks like a diastasis recti.  D/w pt about lifting cautions.  Offered general surgery referral, he'll consider.

## 2022-07-05 ENCOUNTER — Other Ambulatory Visit: Payer: Self-pay | Admitting: Cardiovascular Disease

## 2022-07-05 DIAGNOSIS — I480 Paroxysmal atrial fibrillation: Secondary | ICD-10-CM

## 2022-07-05 NOTE — Telephone Encounter (Signed)
Xarelto '20mg'$  refill request received. Pt is 76 years old, weight-101.9kg, Crea-1.00 on 04/12/2022, last seen by Dr Curt Bears on 05/08/2022, Diagnosis-Afib, CrCl- 87.75 mL/min; Dose is appropriate based on dosing criteria. Will send in refill to requested pharmacy.

## 2022-07-05 NOTE — Telephone Encounter (Signed)
Refill

## 2022-07-18 ENCOUNTER — Telehealth (HOSPITAL_BASED_OUTPATIENT_CLINIC_OR_DEPARTMENT_OTHER): Payer: Self-pay | Admitting: Cardiovascular Disease

## 2022-07-18 NOTE — Telephone Encounter (Signed)
Left message for patient to call and discuss scheduling the follow up Echocardiogram ordered by Dr. Oval Linsey

## 2022-07-20 ENCOUNTER — Ambulatory Visit (HOSPITAL_BASED_OUTPATIENT_CLINIC_OR_DEPARTMENT_OTHER): Payer: Medicare HMO | Admitting: Cardiovascular Disease

## 2022-07-20 ENCOUNTER — Encounter (HOSPITAL_BASED_OUTPATIENT_CLINIC_OR_DEPARTMENT_OTHER): Payer: Self-pay | Admitting: Cardiovascular Disease

## 2022-07-20 VITALS — BP 120/72 | HR 67 | Ht 72.0 in | Wt 224.0 lb

## 2022-07-20 DIAGNOSIS — D151 Benign neoplasm of heart: Secondary | ICD-10-CM

## 2022-07-20 DIAGNOSIS — I480 Paroxysmal atrial fibrillation: Secondary | ICD-10-CM

## 2022-07-20 DIAGNOSIS — E782 Mixed hyperlipidemia: Secondary | ICD-10-CM | POA: Diagnosis not present

## 2022-07-20 DIAGNOSIS — I10 Essential (primary) hypertension: Secondary | ICD-10-CM | POA: Diagnosis not present

## 2022-07-20 NOTE — Progress Notes (Signed)
Cardiology Office Note:    Date:  07/20/2022   ID:  Matthew Sullivan, DOB 06/27/1946, MRN [id number]  PCP:  Tonia Ghent, MD  Cardiologist:  Mertie Moores, MD    Referring MD: Tonia Ghent, MD   No chief complaint on file.   History of Present Illness:    Matthew Sullivan is a 77 y.o. male with a hx of hypertension, hyperlipidemia, atrial myxoma, atrial fibrillation, PE, and TIA, who presents today follow up.  He was first seen in clinic 06/2021. His L atrial myxoma was removed in 2013. No CAD on cath. He has post-op atrial fibrillation and underwent cardioversion on 06/2013. He had a recurrent episode in 2017 and had DCCV. At his first visit he was in atrial fibrillation and his rates were not well controlled.  Metoprolol was increased to 25 mg.  He had not missed any doses of his Xarelto and underwent cardioversion on 06/23/2021.  He had an echo 06/2021 that revealed LVEF 50 to 55% with indeterminate diastolic function.  He had mild mitral regurgitation and the ascending aorta was mildly dilated at 3.8 cm he was set up for sleep study but it has not yet been performed.  He saw Dr. Curt Bears on 07/2021 and discussed ablation but plan to continue monitoring for now.  At his last visit he had a recurrent episode of atrial fibrillation. He had a cardioversion 03/2022. He followed up in EP clinic and was started on flecainide  He required another cardioverison 04/2022. He saw Dr. Curt Bears 04/2022 and he plans to pursue ablation 09/2022. Today, he presents with concerns of atrial fibrillation. He reports that it feels as though he stays in atrial fibrillation, rather than going in and out. He recently had a cold and when taking cold medicine he noticed it worsened his atrial fibrillation.   He has been staying active while working and also walks.  He denies any palpitations, chest pain, shortness of breath, or peripheral edema. No lightheadedness, headaches, syncope, orthopnea, or PND.  Past Medical  History:  Diagnosis Date   Ascending aortic aneurysm ([place]) 09/02/2021   Atrial fibrillation, currently in sinus rhythm    Atrial myxoma    Cervical osteoarthritis    History of TIA (transient ischemic attack)    History of tinnitus    Hyperlipidemia    Hypertension    Melanoma ([place])    per Dr. Allyson Sabal, local excision, no chemo/no rady tx.     Past Surgical History:  Procedure Laterality Date   ANKLE ARTHROSCOPY WITH ARTHRODESIS Left 08/26/2015   Procedure: LEFT ANKLE ARTHROSCOPY TIBIOTALAR ARTHRODESIS;  Surgeon: [name], MD;  Location: [place];  Service: Orthopedics;  Laterality: Left;   APPENDECTOMY     BACK SURGERY     CARDIOVERSION N/A 06/25/2013   Procedure: CARDIOVERSION;  Surgeon: [name], MD;  Location: [place];  Service: Cardiovascular;  Laterality: N/A;   CARDIOVERSION N/A 06/23/2021   Procedure: CARDIOVERSION;  Surgeon: Thayer Headings, MD;  Location: [facility] ENDOSCOPY;  Service: Cardiovascular;  Laterality: N/A;   CARDIOVERSION N/A 03/20/2022   Procedure: CARDIOVERSION;  Surgeon: Skeet Latch, MD;  Location: [facility] ENDOSCOPY;  Service: Cardiovascular;  Laterality: N/A;   CARDIOVERSION N/A 04/12/2022   Procedure: CARDIOVERSION;  Surgeon: Elouise Munroe, MD;  Location: [place];  Service: Cardiovascular;  Laterality: N/A;   EXCISION OF ATRIAL MYXOMA Left 04/09/2013   Procedure: EXCISION OF ATRIAL MYXOMA;  Surgeon: [name], MD;  Location: [place];  Service: Open Heart Surgery;  Laterality: Left;   INTRAOPERATIVE TRANSESOPHAGEAL ECHOCARDIOGRAM N/A 04/09/2013   Procedure: INTRAOPERATIVE TRANSESOPHAGEAL ECHOCARDIOGRAM;  Surgeon: [name], MD;  Location: [place];  Service: Open Heart Surgery;  Laterality: N/A;   LEFT HEART CATHETERIZATION WITH CORONARY ANGIOGRAM N/A 04/07/2013   Procedure: LEFT HEART CATHETERIZATION WITH CORONARY ANGIOGRAM;  Surgeon: [name], MD;  Location: [facility] CATH LAB;  Service: Cardiovascular;   Laterality: N/A;   NASAL SEPTUM SURGERY     NOSE SURGERY     ROTATOR CUFF REPAIR     both shoulders    Current Medications: Current Meds  Medication Sig   acetaminophen (TYLENOL) 500 MG tablet Take 1,000 mg by mouth every 6 (six) hours as needed for moderate pain.   atorvastatin (LIPITOR) 80 MG tablet TAKE 1/2 TABLET (40 MG TOTAL) BY MOUTH DAILY.   hydrochlorothiazide (HYDRODIURIL) 25 MG tablet Take 1 tablet (25 mg total) by mouth daily.   levothyroxine (SYNTHROID) 25 MCG tablet Take 1 tablet (25 mcg total) by mouth daily.   metoprolol tartrate (LOPRESSOR) 25 MG tablet Take 12.5 mg by mouth 2 (two) times daily.   Multiple Vitamin (MULTIVITAMIN WITH MINERALS) TABS tablet Take 1 tablet by mouth daily.   potassium chloride SA (KLOR-CON M) 20 MEQ tablet Take 1 (20 mg) tablet by mouth in the morning and 2 (40 mg) tablets at bedtime   saw palmetto 500 MG capsule Take 500 mg by mouth 2 (two) times daily.   XARELTO 20 MG TABS tablet TAKE 1 TABLET BY MOUTH DAILY WITH SUPPER.   [DISCONTINUED] flecainide (TAMBOCOR) 100 MG tablet Take 1 tablet (100 mg total) by mouth 2 (two) times daily.     Allergies:   Patient has no known allergies.   Social History   Socioeconomic History   Marital status: Married    Spouse name: Not on file   Number of children: Not on file   Years of education: Not on file   Highest education level: Not on file  Occupational History   Not on file  Tobacco Use   Smoking status: Former    Types: Cigarettes    Quit date: 07/10/1977    Years since quitting: 45.0   Smokeless tobacco: Never   Tobacco comments:    Former smoker 04/20/22  Substance and Sexual Activity   Alcohol use: Yes    Comment: rare   Drug use: No   Sexual activity: Not on file  Other Topics Concern   Not on file  Social History Narrative   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   Social Determinants of Health   Financial Resource Strain: [place]  (11/09/2021)   Overall Financial Resource Strain  (CARDIA)    Difficulty of Paying Living Expenses: Not hard at all  Food Insecurity: No [place] (11/09/2021)   Hunger Vital Sign    Worried About Running Out of Food in the Last Year: Never true    [place] in the Last Year: Never true  Transportation Needs: No Transportation Needs (11/09/2021)   PRAPARE - Hydrologist (Medical): No    Lack of Transportation (Non-Medical): No  Physical Activity: Sufficiently Active (11/09/2021)   Exercise Vital Sign    Days of Exercise per Week: 5 days    Minutes of Exercise per Session: 30 min  Stress: No Stress Concern Present (11/09/2021)   [place]    Feeling of Stress :  Not at all  Social Connections: Socially Integrated (11/09/2021)   Social Connection and Isolation Panel [NHANES]    Frequency of Communication with Friends and Family: More than three times a week    Frequency of Social Gatherings with Friends and Family: More than three times a week    Attends Religious Services: More than 4 times per year    Active Member of [employer] or Organizations: Yes    Attends Music therapist: More than 4 times per year    Marital Status: Married     Family History: The patient's family history includes Brain cancer in his father; Cancer in his father; Colon cancer in his mother; Liver cancer in his mother. There is no history of Prostate cancer.  ROS:   Please see the history of present illness.   All other systems reviewed and negative.   EKGs/Labs/Other Studies Reviewed:    The following studies were reviewed today:  Echo 06/27/21:  1. Left ventricular ejection fraction, by estimation, is 50 to 55%. The  left ventricle has low normal function. The left ventricle demonstrates  global hypokinesis. The left ventricular internal cavity size was mildly  dilated. Left ventricular diastolic  parameters are indeterminate.   2. Right  ventricular systolic function is normal. The right ventricular  size is normal.   3. Left atrial size was severely dilated.   4. Right atrial size was severely dilated.   5. The mitral valve is grossly normal. Mild mitral valve regurgitation.  No evidence of mitral stenosis.   6. The aortic valve is normal in structure. Aortic valve regurgitation is  not visualized. No aortic stenosis is present.   7. Aortic dilatation noted. There is borderline dilatation of the  ascending aorta, measuring 38 mm.   CTA Chest 07/13/19 1. Small filling defect is seen in lower lobe branch of right pulmonary artery consistent with small pulmonary embolus. Critical Value/emergent results were called by telephone at the time of interpretation on 07/13/2019 at 3:21 pm to [place] , who verbally acknowledged these results. 2. Small cardiomegaly. 3. Large bilateral airspace opacities are noted concerning for multifocal pneumonia. 4. Aortic atherosclerosis. Aortic Atherosclerosis (ICD10-I70.0).  EKG:  EKG is personally reviewed. 07/20/2022: Atrial flutter. Rate 67 bpm.  03/15/2022:  Atrial fibrillation. Rate 95 bpm. Diffuse ST/T changes. 06/13/21: Afib, rate 115 bpm  Recent Labs: 11/09/2021: ALT 38 03/31/2022: Platelets 246 04/04/2022: TSH 2.91 04/12/2022: BUN 14; Creatinine, Ser 1.00; Hemoglobin 12.9; Potassium 3.9; Sodium 140   Recent Lipid Panel    Component Value Date/Time   CHOL 139 11/09/2021 0839   CHOL 139 04/22/2019 1010   TRIG 139.0 11/09/2021 0839   HDL 42.90 11/09/2021 0839   HDL 44 04/22/2019 1010   CHOLHDL 3 11/09/2021 0839   VLDL 27.8 11/09/2021 0839   LDLCALC 68 11/09/2021 0839   LDLCALC 73 04/22/2019 1010    CHA2DS2-VASc Score = 5 [CHF History: 0, HTN History: 1, Diabetes History: 0, Stroke History: 2 (TIA), Vascular Disease History: 0, Age Score: 2, Gender Score: 0].  Therefore, the patient's annual risk of stroke is 7.2 %.        Physical Exam:    VS:  BP 120/72 (BP  Location: Left Arm, Patient Position: Sitting, Cuff Size: Normal)   Pulse 67   Ht 6' (1.829 m)   Wt 224 lb (101.6 kg)   BMI 30.38 kg/m  , BMI Body mass index is 30.38 kg/m. GENERAL:  Well appearing HEENT: Pupils equal round and reactive,  fundi not visualized, oral mucosa unremarkable NECK:  No jugular venous distention, waveform within normal limits, carotid upstroke brisk and symmetric, no bruits, no thyromegaly LUNGS:  Clear to auscultation bilaterally HEART:  Irregularly Irregular.  PMI not displaced or sustained,S1 and S2 within normal limits, no S3, no S4, no clicks, no rubs, no murmurs ABD:  Flat, positive bowel sounds normal in frequency in pitch, no bruits, no rebound, no guarding, no midline pulsatile mass, no hepatomegaly, no splenomegaly EXT:  2 plus pulses throughout, no edema, no cyanosis no clubbing SKIN:  No rashes no nodules; large ecchymosis of upper RLE NEURO:  Cranial nerves II through XII grossly intact, motor grossly intact throughout PSYCH:  Cognitively intact, oriented to person place and time  ASSESSMENT:    1. Primary hypertension   2. PAF (paroxysmal atrial fibrillation) ([place])   3. Myxoma of heart   4. Mixed hyperlipidemia       PLAN:    Persistent atrial fibrillation ([place]) He is in atrial flutter today.  Rates are well-controlled.  He remains consistently in atrial fibrillation/flutter and does not feel as though he has as much energy as usual.  He is scheduled for ablation in March.  He has had multiple cardioversions that failed.  He is currently on flecainide but it is not working.  Therefore we will discontinue the flecainide.  His last BMP showed hypokalemia.  Will recheck today.  He did already increase his potassium supplementation.  He is on Xarelto.  Check CBC as well.  Continue metoprolol and Xarelto as ordered.  Myxoma of heart Resected in 2013.  HTN (hypertension) Blood pressure well-controlled on HCTZ and  metoprolol.  Hyperlipidemia Lipids are very well-controlled on atorvastatin.  He is due for repeat in May.  Disposition:  FU with Goro Wenrick C. Oval Linsey, MD, [facility] in 6 months.  Medication Adjustments/Labs and Tests Ordered: Current medicines are reviewed at length with the patient today.  Concerns regarding medicines are outlined above.   Orders Placed This Encounter  Procedures   Basic metabolic panel   CBC   EKG 12-Lead   No orders of the defined types were placed in this encounter.   I,Matthew Sullivan,acting as a Education administrator for Skeet Latch, MD.,have documented all relevant documentation on the behalf of Skeet Latch, MD,as directed by  Skeet Latch, MD while in the presence of Skeet Latch, MD.  I, Matthew Oval Linsey, MD have reviewed all documentation for this visit.  The documentation of the exam, diagnosis, procedures, and orders on 07/20/2022 are all accurate and complete.    Signed, Skeet Latch, MD  07/20/2022 9:33 AM    [place]

## 2022-07-20 NOTE — Patient Instructions (Signed)
Medication Instructions:  Stop Flecainide  *If you need a refill on your cardiac medications before your next appointment, please call your pharmacy*   Lab Work: Basic Metabolic Panel CBC  If you have labs (blood work) drawn today and your tests are completely normal, you will receive your results only by: [place] (if you have MyChart) OR A paper copy in the mail If you have any lab test that is abnormal or we need to change your treatment, we will call you to review the results.   Testing/Procedures: None   Follow-Up: At [facility], you and your health needs are our priority.  As part of our continuing mission to provide you with exceptional heart care, we have created designated Provider Care Teams.  These Care Teams include your primary Cardiologist (physician) and Advanced Practice Providers (APPs -  Physician Assistants and Nurse Practitioners) who all work together to provide you with the care you need, when you need it.  We recommend signing up for the patient portal called "MyChart".  Sign up information is provided on this After Visit Summary.  MyChart is used to connect with patients for Virtual Visits (Telemedicine).  Patients are able to view lab/test results, encounter notes, upcoming appointments, etc.  Non-urgent messages can be sent to your provider as well.   To learn more about what you can do with MyChart, go to [email or web address].    Your next appointment:   6 month(s)  Provider:   Skeet Latch, MD    Other Instructions None

## 2022-07-20 NOTE — Assessment & Plan Note (Signed)
Lipids are very well-controlled on atorvastatin.  He is due for repeat in May.

## 2022-07-20 NOTE — Assessment & Plan Note (Signed)
Resected in 2013.

## 2022-07-20 NOTE — Assessment & Plan Note (Signed)
Blood pressure well-controlled on HCTZ and metoprolol.

## 2022-07-20 NOTE — Assessment & Plan Note (Signed)
He is in atrial flutter today.  Rates are well-controlled.  He remains consistently in atrial fibrillation/flutter and does not feel as though he has as much energy as usual.  He is scheduled for ablation in March.  He has had multiple cardioversions that failed.  He is currently on flecainide but it is not working.  Therefore we will discontinue the flecainide.  His last BMP showed hypokalemia.  Will recheck today.  He did already increase his potassium supplementation.  He is on Xarelto.  Check CBC as well.  Continue metoprolol and Xarelto as ordered.

## 2022-07-21 LAB — CBC
Hematocrit: 43 % (ref 37.5–51.0)
Hemoglobin: 14.2 g/dL (ref 13.0–17.7)
MCH: 30.5 pg (ref 26.6–33.0)
MCHC: 33 g/dL (ref 31.5–35.7)
MCV: 92 fL (ref 79–97)
Platelets: 172 10*3/uL (ref 150–450)
RBC: 4.66 x10E6/uL (ref 4.14–5.80)
RDW: 12.9 % (ref 11.6–15.4)
WBC: 8.2 10*3/uL (ref 3.4–10.8)

## 2022-07-21 LAB — BASIC METABOLIC PANEL
BUN/Creatinine Ratio: 12 (ref 10–24)
BUN: 13 mg/dL (ref 8–27)
CO2: 24 mmol/L (ref 20–29)
Calcium: 9.1 mg/dL (ref 8.6–10.2)
Chloride: 99 mmol/L (ref 96–106)
Creatinine, Ser: 1.12 mg/dL (ref 0.76–1.27)
Glucose: 117 mg/dL — ABNORMAL HIGH (ref 70–99)
Potassium: 4.3 mmol/L (ref 3.5–5.2)
Sodium: 141 mmol/L (ref 134–144)
eGFR: 67 mL/min/{1.73_m2} (ref >59)

## 2022-07-26 ENCOUNTER — Telehealth: Payer: Self-pay | Admitting: Cardiovascular Disease

## 2022-07-26 ENCOUNTER — Other Ambulatory Visit: Payer: Self-pay

## 2022-07-26 MED ORDER — POTASSIUM CHLORIDE CRYS ER 20 MEQ PO TBCR: 20.0000 meq | EXTENDED_RELEASE_TABLET | Freq: Every day | ORAL | 3 refills | Status: DC

## 2022-07-26 NOTE — Telephone Encounter (Signed)
Pt c/o medication issue:  1. Name of Medication: potassium chloride SA (KLOR-CON M) 20 MEQ tablet   2. How are you currently taking this medication (dosage and times per day)? Not sure   3. Are you having a reaction (difficulty breathing--STAT)?   4. What is your medication issue? Pharmacy called in needing clarification on instructions for medication

## 2022-07-26 NOTE — Telephone Encounter (Signed)
Reviewed chart and looks as though Clint F PA increase K+ to TID 10/23 labs, left message for patient to call back and confirm

## 2022-07-26 NOTE — Telephone Encounter (Signed)
This is Dr. Bellingham's pt.  °

## 2022-07-27 MED ORDER — POTASSIUM CHLORIDE CRYS ER 20 MEQ PO TBCR: EXTENDED_RELEASE_TABLET | ORAL | 3 refills | Status: DC

## 2022-07-27 NOTE — Telephone Encounter (Signed)
Spoke with pharmacy patient stated taking K+ 1 in the morning and 2 in the evening  Refill sent as requested

## 2022-07-27 NOTE — Telephone Encounter (Signed)
Follow Up:    Matthew Sullivan is calling back, concerning directions on patient's medicine.p

## 2022-08-07 ENCOUNTER — Other Ambulatory Visit (HOSPITAL_BASED_OUTPATIENT_CLINIC_OR_DEPARTMENT_OTHER): Payer: Medicare HMO

## 2022-09-04 ENCOUNTER — Ambulatory Visit: Payer: Medicare HMO | Attending: Cardiovascular Disease

## 2022-09-04 DIAGNOSIS — I4891 Unspecified atrial fibrillation: Secondary | ICD-10-CM | POA: Diagnosis not present

## 2022-09-04 DIAGNOSIS — I4819 Other persistent atrial fibrillation: Secondary | ICD-10-CM

## 2022-09-04 LAB — CBC
Hematocrit: 39.1 % (ref 37.5–51.0)
Hemoglobin: 13.1 g/dL (ref 13.0–17.7)
MCH: 30.8 pg (ref 26.6–33.0)
MCHC: 33.5 g/dL (ref 31.5–35.7)
MCV: 92 fL (ref 79–97)
Platelets: 187 10*3/uL (ref 150–450)
RBC: 4.26 x10E6/uL (ref 4.14–5.80)
RDW: 14.9 % (ref 11.6–15.4)
WBC: 5.6 10*3/uL (ref 3.4–10.8)

## 2022-09-04 LAB — BASIC METABOLIC PANEL
BUN/Creatinine Ratio: 14 (ref 10–24)
BUN: 14 mg/dL (ref 8–27)
CO2: 28 mmol/L (ref 20–29)
Calcium: 9.1 mg/dL (ref 8.6–10.2)
Chloride: 103 mmol/L (ref 96–106)
Creatinine, Ser: 1.02 mg/dL (ref 0.76–1.27)
Glucose: 108 mg/dL — ABNORMAL HIGH (ref 70–99)
Potassium: 4.3 mmol/L (ref 3.5–5.2)
Sodium: 141 mmol/L (ref 134–144)
eGFR: 75 mL/min/{1.73_m2} (ref >59)

## 2022-09-11 ENCOUNTER — Other Ambulatory Visit (HOSPITAL_BASED_OUTPATIENT_CLINIC_OR_DEPARTMENT_OTHER): Payer: Self-pay | Admitting: Cardiovascular Disease

## 2022-09-11 ENCOUNTER — Telehealth (HOSPITAL_COMMUNITY): Payer: Self-pay | Admitting: Emergency Medicine

## 2022-09-11 NOTE — Telephone Encounter (Signed)
Rx(s) sent to pharmacy electronically.  

## 2022-09-11 NOTE — Telephone Encounter (Signed)
Attempted to call patient regarding upcoming cardiac CT appointment. °Left message on voicemail with name and callback number °Evens Meno RN Navigator Cardiac Imaging °[facility] °[phone] Office °[phone] Cell ° °

## 2022-09-12 ENCOUNTER — Ambulatory Visit (HOSPITAL_BASED_OUTPATIENT_CLINIC_OR_DEPARTMENT_OTHER)
Admission: RE | Admit: 2022-09-12 | Discharge: 2022-09-12 | Disposition: A | Discharge status: Home / Self Care | Payer: Medicare HMO | Source: Ambulatory Visit | Attending: Cardiology | Admitting: Cardiology

## 2022-09-12 DIAGNOSIS — I4891 Unspecified atrial fibrillation: Secondary | ICD-10-CM | POA: Diagnosis not present

## 2022-09-12 MED ORDER — IOHEXOL 350 MG/ML SOLN
100.0000 mL | Freq: Once | INTRAVENOUS | Status: AC | PRN
  Administered 2022-09-12: 80 mL via INTRAVENOUS

## 2022-09-18 NOTE — Pre-Procedure Instructions (Signed)
Instructed patient on the following items: Arrival time 1100 Nothing to eat or drink after midnight No meds AM of procedure Responsible person to drive you home and stay with you for 24 hrs  Have you missed any doses of anti-coagulant Xarelto- hasn't missed any doses    

## 2022-09-19 ENCOUNTER — Other Ambulatory Visit: Payer: Self-pay

## 2022-09-19 ENCOUNTER — Ambulatory Visit (HOSPITAL_BASED_OUTPATIENT_CLINIC_OR_DEPARTMENT_OTHER): Payer: Medicare HMO | Admitting: Certified Registered Nurse Anesthetist

## 2022-09-19 ENCOUNTER — Ambulatory Visit (HOSPITAL_COMMUNITY)
Admission: RE | Admit: 2022-09-19 | Discharge: 2022-09-19 | Disposition: A | Discharge status: Home / Self Care | Payer: Medicare HMO | Attending: Cardiology | Admitting: Cardiology

## 2022-09-19 ENCOUNTER — Ambulatory Visit (HOSPITAL_COMMUNITY): Payer: Medicare HMO | Admitting: Certified Registered Nurse Anesthetist

## 2022-09-19 ENCOUNTER — Encounter (HOSPITAL_COMMUNITY): Payer: Self-pay | Admitting: Cardiology

## 2022-09-19 ENCOUNTER — Encounter (HOSPITAL_COMMUNITY)
Admission: RE | Disposition: A | Discharge status: Home / Self Care | Payer: Self-pay | Source: Home / Self Care | Attending: Cardiology

## 2022-09-19 DIAGNOSIS — I739 Peripheral vascular disease, unspecified: Secondary | ICD-10-CM | POA: Diagnosis not present

## 2022-09-19 DIAGNOSIS — Z8673 Personal history of transient ischemic attack (TIA), and cerebral infarction without residual deficits: Secondary | ICD-10-CM | POA: Insufficient documentation

## 2022-09-19 DIAGNOSIS — I4819 Other persistent atrial fibrillation: Secondary | ICD-10-CM | POA: Insufficient documentation

## 2022-09-19 DIAGNOSIS — I10 Essential (primary) hypertension: Secondary | ICD-10-CM | POA: Diagnosis not present

## 2022-09-19 DIAGNOSIS — Z87891 Personal history of nicotine dependence: Secondary | ICD-10-CM | POA: Insufficient documentation

## 2022-09-19 DIAGNOSIS — Z86018 Personal history of other benign neoplasm: Secondary | ICD-10-CM | POA: Insufficient documentation

## 2022-09-19 DIAGNOSIS — I4891 Unspecified atrial fibrillation: Secondary | ICD-10-CM

## 2022-09-19 DIAGNOSIS — E039 Hypothyroidism, unspecified: Secondary | ICD-10-CM

## 2022-09-19 HISTORY — PX: ATRIAL FIBRILLATION ABLATION: EP1191

## 2022-09-19 LAB — POCT ACTIVATED CLOTTING TIME: Activated Clotting Time: 336 seconds

## 2022-09-19 SURGERY — ATRIAL FIBRILLATION ABLATION
Anesthesia: General

## 2022-09-19 MED ORDER — SUGAMMADEX SODIUM 200 MG/2ML IV SOLN
INTRAVENOUS | Status: DC | PRN
  Administered 2022-09-19: 200 mg via INTRAVENOUS

## 2022-09-19 MED ORDER — SODIUM CHLORIDE 0.9 % IV SOLN: 250.0000 mL | INTRAVENOUS | Status: DC | PRN

## 2022-09-19 MED ORDER — ACETAMINOPHEN 500 MG PO TABS
1000.0000 mg | ORAL_TABLET | Freq: Once | ORAL | Status: AC
  Administered 2022-09-19: 1000 mg via ORAL
  Filled 2022-09-19: qty 2

## 2022-09-19 MED ORDER — HEPARIN (PORCINE) IN NACL 1000-0.9 UT/500ML-% IV SOLN
INTRAVENOUS | Status: DC | PRN
  Administered 2022-09-19 (×4): 500 mL

## 2022-09-19 MED ORDER — HEPARIN SODIUM (PORCINE) 1000 UNIT/ML IJ SOLN
INTRAMUSCULAR | Status: DC | PRN
  Administered 2022-09-19: 15000 [IU] via INTRAVENOUS
  Administered 2022-09-19: 2000 [IU] via INTRAVENOUS

## 2022-09-19 MED ORDER — FENTANYL CITRATE (PF) 100 MCG/2ML IJ SOLN
INTRAMUSCULAR | Status: DC | PRN
  Administered 2022-09-19 (×2): 50 ug via INTRAVENOUS

## 2022-09-19 MED ORDER — ONDANSETRON HCL 4 MG/2ML IJ SOLN
INTRAMUSCULAR | Status: DC | PRN
  Administered 2022-09-19: 4 mg via INTRAVENOUS

## 2022-09-19 MED ORDER — ALBUMIN HUMAN 5 % IV SOLN
12.5000 g | Freq: Once | INTRAVENOUS | Status: AC
  Administered 2022-09-19: 12.5 g via INTRAVENOUS

## 2022-09-19 MED ORDER — SODIUM CHLORIDE 0.9% FLUSH: 3.0000 mL | Freq: Two times a day (BID) | INTRAVENOUS | Status: DC

## 2022-09-19 MED ORDER — HEPARIN SODIUM (PORCINE) 1000 UNIT/ML IJ SOLN
INTRAMUSCULAR | Status: AC
  Filled 2022-09-19: qty 20

## 2022-09-19 MED ORDER — ACETAMINOPHEN 325 MG PO TABS: 650.0000 mg | ORAL_TABLET | ORAL | Status: DC | PRN

## 2022-09-19 MED ORDER — DEXAMETHASONE SODIUM PHOSPHATE 10 MG/ML IJ SOLN
INTRAMUSCULAR | Status: DC | PRN
  Administered 2022-09-19: 10 mg via INTRAVENOUS

## 2022-09-19 MED ORDER — SODIUM CHLORIDE 0.9% FLUSH: 3.0000 mL | INTRAVENOUS | Status: DC | PRN

## 2022-09-19 MED ORDER — DOBUTAMINE INFUSION FOR EP/ECHO/NUC (1000 MCG/ML)
INTRAVENOUS | Status: AC
  Filled 2022-09-19: qty 250

## 2022-09-19 MED ORDER — LIDOCAINE 2% (20 MG/ML) 5 ML SYRINGE
INTRAMUSCULAR | Status: DC | PRN
  Administered 2022-09-19: 40 mg via INTRAVENOUS

## 2022-09-19 MED ORDER — PHENYLEPHRINE 80 MCG/ML (10ML) SYRINGE FOR IV PUSH (FOR BLOOD PRESSURE SUPPORT)
PREFILLED_SYRINGE | INTRAVENOUS | Status: DC | PRN
  Administered 2022-09-19: 80 ug via INTRAVENOUS

## 2022-09-19 MED ORDER — PROPOFOL 10 MG/ML IV BOLUS
INTRAVENOUS | Status: DC | PRN
  Administered 2022-09-19: 200 mg via INTRAVENOUS

## 2022-09-19 MED ORDER — ALBUMIN HUMAN 5 % IV SOLN
INTRAVENOUS | Status: AC
  Filled 2022-09-19: qty 250

## 2022-09-19 MED ORDER — DOBUTAMINE INFUSION FOR EP/ECHO/NUC (1000 MCG/ML)
INTRAVENOUS | Status: DC | PRN
  Administered 2022-09-19: 20 ug/kg/min via INTRAVENOUS

## 2022-09-19 MED ORDER — ROCURONIUM BROMIDE 10 MG/ML (PF) SYRINGE
PREFILLED_SYRINGE | INTRAVENOUS | Status: DC | PRN
  Administered 2022-09-19: 60 mg via INTRAVENOUS

## 2022-09-19 MED ORDER — ONDANSETRON HCL 4 MG/2ML IJ SOLN: 4.0000 mg | Freq: Four times a day (QID) | INTRAMUSCULAR | Status: DC | PRN

## 2022-09-19 MED ORDER — PHENYLEPHRINE HCL-NACL 20-0.9 MG/250ML-% IV SOLN
INTRAVENOUS | Status: DC | PRN
  Administered 2022-09-19: 25 ug/min via INTRAVENOUS
  Administered 2022-09-19: 80 ug via INTRAVENOUS

## 2022-09-19 MED ORDER — SODIUM CHLORIDE 0.9 % IV SOLN: INTRAVENOUS | Status: DC

## 2022-09-19 MED ORDER — PROTAMINE SULFATE 10 MG/ML IV SOLN
INTRAVENOUS | Status: DC | PRN
  Administered 2022-09-19: 40 mg via INTRAVENOUS

## 2022-09-19 MED ORDER — HEPARIN SODIUM (PORCINE) 1000 UNIT/ML IJ SOLN
INTRAMUSCULAR | Status: DC | PRN
  Administered 2022-09-19: 1000 [IU] via INTRAVENOUS

## 2022-09-19 SURGICAL SUPPLY — 17 items
CATH ABLAT QDOT MICRO BI TC DF (CATHETERS) IMPLANT
CATH OCTARAY 2.0 F 3-3-3-3-3 (CATHETERS) IMPLANT
CATH PIGTAIL STEERABLE D1 8.7 (WIRE) IMPLANT
CATH S-M CIRCA TEMP PROBE (CATHETERS) IMPLANT
CATH SOUNDSTAR ECO 8FR (CATHETERS) IMPLANT
CATH WEBSTER BI DIR CS D-F CRV (CATHETERS) IMPLANT
CLOSURE PERCLOSE PROSTYLE (VASCULAR PRODUCTS) IMPLANT
COVER SWIFTLINK CONNECTOR (BAG) ×1 IMPLANT
PACK EP LATEX FREE (CUSTOM PROCEDURE TRAY) ×1
PACK EP LF (CUSTOM PROCEDURE TRAY) ×1 IMPLANT
PAD DEFIB RADIO PHYSIO CONN (PAD) ×1 IMPLANT
SHEATH CARTO VIZIGO SM CVD (SHEATH) IMPLANT
SHEATH PINNACLE 7F 10CM (SHEATH) IMPLANT
SHEATH PINNACLE 8F 10CM (SHEATH) IMPLANT
SHEATH PINNACLE 9F 10CM (SHEATH) IMPLANT
SHEATH PROBE COVER 6X72 (BAG) IMPLANT
TUBING SMART ABLATE COOLFLOW (TUBING) IMPLANT

## 2022-09-19 NOTE — Anesthesia Postprocedure Evaluation (Signed)
Anesthesia Post Note  Patient: Matthew Sullivan  Procedure(s) Performed: ATRIAL FIBRILLATION ABLATION     Patient location during evaluation: PACU Anesthesia Type: General Level of consciousness: awake and alert Pain management: pain level controlled Vital Signs Assessment: post-procedure vital signs reviewed and stable Respiratory status: spontaneous breathing, nonlabored ventilation, respiratory function stable and patient connected to nasal cannula oxygen Cardiovascular status: blood pressure returned to baseline and stable Postop Assessment: no apparent nausea or vomiting Anesthetic complications: no Comments: Hypotension resolved with fluids and without additional meds. CP resolved. Pt stable for discharge.  Deatra Canter, MD  Encounter Notable Events  Notable Event Outcome Phase Comment  Difficult to intubate - expected  Intraprocedure Filed from anesthesia note documentation.  None  Intraprocedure     Last Vitals:  Vitals:   09/19/22 1625 09/19/22 1700  BP: 117/69 115/72  Pulse: 65 (!) 111  Resp: 12 (!) 22  Temp:    SpO2: 99% 93%    Last Pain:  Vitals:   09/19/22 1641  TempSrc:   PainSc: 0-No pain                 Tiajuana Amass

## 2022-09-19 NOTE — Anesthesia Preprocedure Evaluation (Signed)
Anesthesia Evaluation  Patient identified by MRN, date of birth, ID band Patient awake    Reviewed: Allergy & Precautions, NPO status , Patient's Chart, lab work & pertinent test results  Airway Mallampati: III  TM Distance: <3 FB Neck ROM: Full    Dental   Pulmonary former smoker   breath sounds clear to auscultation       Cardiovascular hypertension, Pt. on medications and Pt. on home beta blockers + Peripheral Vascular Disease  + dysrhythmias Atrial Fibrillation  Rhythm:Regular Rate:Normal  Hx L atrial myxoma s/p resection in 2013.   Neuro/Psych TIA   GI/Hepatic negative GI ROS, Neg liver ROS,,,  Endo/Other  Hypothyroidism    Renal/GU negative Renal ROS     Musculoskeletal  (+) Arthritis ,    Abdominal   Peds  Hematology negative hematology ROS (+)   Anesthesia Other Findings   Reproductive/Obstetrics                             Anesthesia Physical Anesthesia Plan  ASA: 3  Anesthesia Plan: General   Post-op Pain Management: Tylenol PO (pre-op)* and Minimal or no pain anticipated   Induction: Intravenous  PONV Risk Score and Plan: 2 and Dexamethasone and Ondansetron  Airway Management Planned: Oral ETT  Additional Equipment: None  Intra-op Plan:   Post-operative Plan: Extubation in OR  Informed Consent: I have reviewed the patients History and Physical, chart, labs and discussed the procedure including the risks, benefits and alternatives for the proposed anesthesia with the patient or authorized representative who has indicated his/her understanding and acceptance.       Plan Discussed with:   Anesthesia Plan Comments:        Anesthesia Quick Evaluation

## 2022-09-19 NOTE — Anesthesia Procedure Notes (Signed)
Procedure Name: Intubation Date/Time: 09/19/2022 1:11 PM  Performed by: Colin Benton, CRNAPre-anesthesia Checklist: Patient identified, Emergency Drugs available, Suction available and Patient being monitored Patient Re-evaluated:Patient Re-evaluated prior to induction Oxygen Delivery Method: Circle system utilized Preoxygenation: Pre-oxygenation with 100% oxygen Induction Type: IV induction Ventilation: Mask ventilation without difficulty Laryngoscope Size: Glidescope and 3 Grade View: Grade I Tube type: Oral Tube size: 7.5 mm Number of attempts: 1 Airway Equipment and Method: Rigid stylet and Video-laryngoscopy Placement Confirmation: ETT inserted through vocal cords under direct vision, positive ETCO2 and breath sounds checked- equal and bilateral Secured at: 22 cm Tube secured with: Tape Dental Injury: Teeth and Oropharynx as per pre-operative assessment  Difficulty Due To: Difficulty was anticipated and Difficult Airway- due to limited oral opening Comments: Elective glidescope intubation due to previous use and limited oral opening.  Grade 1 view.  EBBS and VSS.

## 2022-09-19 NOTE — H&P (Signed)
Electrophysiology Office Note   Date:  09/19/2022   ID:  Matthew, Sullivan January 01, 1946, MRN [id number]  PCP:  Tonia Ghent, MD  Cardiologist:  [facility] Primary Electrophysiologist:  Javohn Basey Meredith Leeds, MD    Chief Complaint: AF   History of Present Illness: Matthew Sullivan is a 77 y.o. male who is being seen today for the evaluation of AF at the request of No ref. provider found. Presenting today for electrophysiology evaluation.  He has a history significant for hypertension, hyperlipidemia, atrial myxoma, atrial fibrillation, TIA.  His myxoma was excised in 2013.  He had a catheterization at the time that showed no evidence of coronary disease.  He had postoperative atrial fibrillation post cardioversion 2014.  He was started on amiodarone which was subsequently discontinued.  He went back into atrial fibrillation at the end of November 2022.  He had symptoms of fatigue and palpitations.  He has had multiple cardioversions this year.  Today, denies symptoms of palpitations, chest pain, shortness of breath, orthopnea, PND, lower extremity edema, claudication, dizziness, presyncope, syncope, bleeding, or neurologic sequela. The patient is tolerating medications without difficulties. Plan ablation today.     Past Medical History:  Diagnosis Date   Ascending aortic aneurysm ([place]) 09/02/2021   Atrial fibrillation, currently in sinus rhythm    Atrial myxoma    Cervical osteoarthritis    History of TIA (transient ischemic attack)    History of tinnitus    Hyperlipidemia    Hypertension    Melanoma ([place])    per Dr. Allyson Sabal, local excision, no chemo/no rady tx.    Past Surgical History:  Procedure Laterality Date   ANKLE ARTHROSCOPY WITH ARTHRODESIS Left 08/26/2015   Procedure: LEFT ANKLE ARTHROSCOPY TIBIOTALAR ARTHRODESIS;  Surgeon: [name], MD;  Location: [facility];  Service: Orthopedics;  Laterality: Left;   APPENDECTOMY     BACK SURGERY      CARDIOVERSION N/A 06/25/2013   Procedure: CARDIOVERSION;  Surgeon: [name], MD;  Location: [facility];  Service: Cardiovascular;  Laterality: N/A;   CARDIOVERSION N/A 06/23/2021   Procedure: CARDIOVERSION;  Surgeon: Thayer Headings, MD;  Location: [facility] ENDOSCOPY;  Service: Cardiovascular;  Laterality: N/A;   CARDIOVERSION N/A 03/20/2022   Procedure: CARDIOVERSION;  Surgeon: Skeet Latch, MD;  Location: [facility] ENDOSCOPY;  Service: Cardiovascular;  Laterality: N/A;   CARDIOVERSION N/A 04/12/2022   Procedure: CARDIOVERSION;  Surgeon: Elouise Munroe, MD;  Location: [place];  Service: Cardiovascular;  Laterality: N/A;   EXCISION OF ATRIAL MYXOMA Left 04/09/2013   Procedure: EXCISION OF ATRIAL MYXOMA;  Surgeon: [name], MD;  Location: [place];  Service: Open Heart Surgery;  Laterality: Left;   INTRAOPERATIVE TRANSESOPHAGEAL ECHOCARDIOGRAM N/A 04/09/2013   Procedure: INTRAOPERATIVE TRANSESOPHAGEAL ECHOCARDIOGRAM;  Surgeon: [name], MD;  Location: [place];  Service: Open Heart Surgery;  Laterality: N/A;   LEFT HEART CATHETERIZATION WITH CORONARY ANGIOGRAM N/A 04/07/2013   Procedure: LEFT HEART CATHETERIZATION WITH CORONARY ANGIOGRAM;  Surgeon: [name], MD;  Location: [facility] CATH LAB;  Service: Cardiovascular;  Laterality: N/A;   NASAL SEPTUM SURGERY     NOSE SURGERY     ROTATOR CUFF REPAIR     both shoulders     Current Facility-Administered Medications  Medication Dose Route Frequency Provider Last Rate Last Admin   0.9 %  sodium chloride infusion   Intravenous Continuous Constance Haw, MD 50 mL/hr at 09/19/22 1152 New Bag at 09/19/22 1152    Allergies:  Patient has no known allergies.   Social History:  The patient  reports that he quit smoking about 45 years ago. His smoking use included cigarettes. He has never used smokeless tobacco. He reports current alcohol use. He reports that he does not use drugs.   Family History:  The patient's  family history includes Brain cancer in his father; Cancer in his father; Colon cancer in his mother; Liver cancer in his mother.   ROS:  Please see the history of present illness.   Otherwise, review of systems is positive for none.   All other systems are reviewed and negative.   PHYSICAL EXAM: VS:  BP (!) 149/97   Pulse 96   Temp 97.8 F (36.6 C) (Oral)   Resp 19   Ht 6' (1.829 m)   Wt 99.8 kg   SpO2 95%   BMI 29.84 kg/m  , BMI Body mass index is 29.84 kg/m. GEN: Well nourished, well developed, in no acute distress  HEENT: normal  Neck: no JVD, carotid bruits, or masses Cardiac: iRRR; no murmurs, rubs, or gallops,no edema  Respiratory:  clear to auscultation bilaterally, normal work of breathing GI: soft, nontender, nondistended, + BS MS: no deformity or atrophy  Skin: warm and dry Neuro:  Strength and sensation are intact Psych: euthymic mood, full affect    Recent Labs: 11/09/2021: ALT 38 04/04/2022: TSH 2.91 09/04/2022: BUN 14; Creatinine, Ser 1.02; Hemoglobin 13.1; Platelets 187; Potassium 4.3; Sodium 141    Lipid Panel     Component Value Date/Time   CHOL 139 11/09/2021 0839   CHOL 139 04/22/2019 1010   TRIG 139.0 11/09/2021 0839   HDL 42.90 11/09/2021 0839   HDL 44 04/22/2019 1010   CHOLHDL 3 11/09/2021 0839   VLDL 27.8 11/09/2021 0839   LDLCALC 68 11/09/2021 0839   LDLCALC 73 04/22/2019 1010     Wt Readings from Last 3 Encounters:  09/19/22 99.8 kg  07/20/22 101.6 kg  05/08/22 101.9 kg      Other studies Reviewed: Additional studies/ records that were reviewed today include: TTE 06/27/21  Review of the above records today demonstrates:   1. Left ventricular ejection fraction, by estimation, is 50 to 55%. The  left ventricle has low normal function. The left ventricle demonstrates  global hypokinesis. The left ventricular internal cavity size was mildly  dilated. Left ventricular diastolic  parameters are indeterminate.   2. Right ventricular  systolic function is normal. The right ventricular  size is normal.   3. Left atrial size was severely dilated.   4. Right atrial size was severely dilated.   5. The mitral valve is grossly normal. Mild mitral valve regurgitation.  No evidence of mitral stenosis.   6. The aortic valve is normal in structure. Aortic valve regurgitation is  not visualized. No aortic stenosis is present.   7. Aortic dilatation noted. There is borderline dilatation of the  ascending aorta, measuring 38 mm.   ASSESSMENT AND PLAN:  1.  Persistent atrial fibrillation: Matthew Sullivan has presented today for surgery, with the diagnosis of AF.  The various methods of treatment have been discussed with the patient and family. After consideration of risks, benefits and other options for treatment, the patient has consented to  Procedure(s): Catheter ablation as a surgical intervention .  Risks include but not limited to complete heart block, stroke, esophageal damage, nerve damage, bleeding, vascular damage, tamponade, perforation, MI, and death. The patient's history has been reviewed, patient examined, no change  in status, stable for surgery.  I have reviewed the patient's chart and labs.  Questions were answered to the patient's satisfaction.    Cordelle Dahmen Curt Bears, MD 09/19/2022 12:16 PM

## 2022-09-19 NOTE — Transfer of Care (Signed)
Immediate Anesthesia Transfer of Care Note  Patient: Matthew Sullivan  Procedure(s) Performed: ATRIAL FIBRILLATION ABLATION  Patient Location: PACU and Cath Lab  Anesthesia Type:General  Level of Consciousness: awake, alert , oriented, and patient cooperative  Airway & Oxygen Therapy: Patient Spontanous Breathing and Patient connected to nasal cannula oxygen  Post-op Assessment: Report given to RN and Post -op Vital signs reviewed and stable  Post vital signs: Reviewed and stable  Last Vitals:  Vitals Value Taken Time  BP 116/62 09/19/22 1505  Temp 36.6 C 09/19/22 1505  Pulse 69 09/19/22 1506  Resp 26 09/19/22 1506  SpO2 96 % 09/19/22 1506  Vitals shown include unvalidated device data.  Last Pain:  Vitals:   09/19/22 1505  TempSrc: Temporal  PainSc: 0-No pain         Complications:  Encounter Notable Events  Notable Event Outcome Phase Comment  Difficult to intubate - expected  Intraprocedure Filed from anesthesia note documentation.  None  Intraprocedure

## 2022-09-19 NOTE — Progress Notes (Signed)
Pt ambulated to and from bathroom to void with no signs of oozing from bilateral groin sites

## 2022-09-19 NOTE — Discharge Instructions (Signed)

## 2022-09-19 NOTE — Progress Notes (Signed)
Called to cath lab PACU for hypotension and bradycardia at 3:41pm. Arrived shortly thereafter and albumin and fluid bolus in place. Pt had drifting of BP's prior to HR down into 40's. Pt had chest pain on right side and difficulty breathing at time of hypotension. BP in 80's on arrival. Still complaining of CP. Phenylephrine bolus of 126mg given. 12 lead EKG obtained without ST changes. CP improved with BP up to [id number] and [id number] Fluid bolus still going. Bilateral groins soft w/o hematoma. Pt does not endorse back pain or any other symptoms. Echo normal post op per Camnitz, MD. Plan to monitor BP with fluid bolus and will place on neo gtt if hypotension persists or if CP returns. Plan discussed with Dr. CCurt Bearswho is in agreement.  RDeatra Canter MD

## 2022-09-20 ENCOUNTER — Encounter (HOSPITAL_COMMUNITY): Payer: Self-pay | Admitting: Cardiology

## 2022-09-20 ENCOUNTER — Telehealth: Payer: Self-pay | Admitting: Cardiology

## 2022-09-20 NOTE — Telephone Encounter (Signed)
Patient calling states that he had ablation on yesterday and not he has some leakage going on. Please advise

## 2022-09-20 NOTE — Telephone Encounter (Signed)
Pt reports right groin bleeding yesterday evening.  He did have to change bandage once. He will keep an eye on it. Discussed not keeping groin covered for more than day/two, but that under circumstances he can keep a clean dressing over site while "leaking". Aware to change dsg daily/when soiled.  But still understands no bandage for more that couple days. Advised that if does not improve by end of week to call our office/afib clinic for appt to assess site. Patient verbalized understanding and agreeable to plan.

## 2022-09-21 NOTE — Telephone Encounter (Signed)
Wife is calling back to say, that the patient is still leaking. Looking to get him in asap. Please advise

## 2022-09-21 NOTE — Telephone Encounter (Signed)
Spoke to pt who reports continued drainage at groin site. States he has been putting pressure there. Changed gauze many times d/t drainage. Scheduled pt for groin assessment tomorrow in the AFib clinic. Pt agreeable to plan.

## 2022-09-22 ENCOUNTER — Ambulatory Visit (HOSPITAL_COMMUNITY)
Admission: RE | Admit: 2022-09-22 | Discharge: 2022-09-22 | Disposition: A | Discharge status: Home / Self Care | Payer: Medicare HMO | Source: Ambulatory Visit | Attending: Physician Assistant | Admitting: Physician Assistant

## 2022-09-22 DIAGNOSIS — I4819 Other persistent atrial fibrillation: Secondary | ICD-10-CM | POA: Diagnosis not present

## 2022-09-22 DIAGNOSIS — I10 Essential (primary) hypertension: Secondary | ICD-10-CM | POA: Insufficient documentation

## 2022-09-22 DIAGNOSIS — D6869 Other thrombophilia: Secondary | ICD-10-CM | POA: Diagnosis not present

## 2022-09-22 DIAGNOSIS — Z7901 Long term (current) use of anticoagulants: Secondary | ICD-10-CM | POA: Diagnosis not present

## 2022-09-22 DIAGNOSIS — E785 Hyperlipidemia, unspecified: Secondary | ICD-10-CM | POA: Insufficient documentation

## 2022-09-22 DIAGNOSIS — Z79899 Other long term (current) drug therapy: Secondary | ICD-10-CM | POA: Insufficient documentation

## 2022-09-22 LAB — CBC
HCT: 38.9 % — ABNORMAL LOW (ref 39.0–52.0)
Hemoglobin: 12.9 g/dL — ABNORMAL LOW (ref 13.0–17.0)
MCH: 31.7 pg (ref 26.0–34.0)
MCHC: 33.2 g/dL (ref 30.0–36.0)
MCV: 95.6 fL (ref 80.0–100.0)
Platelets: 158 10*3/uL (ref 150–400)
RBC: 4.07 MIL/uL — ABNORMAL LOW (ref 4.22–5.81)
RDW: 14.1 % (ref 11.5–15.5)
WBC: 7.5 10*3/uL (ref 4.0–10.5)
nRBC: 0 % (ref 0.0–0.2)

## 2022-09-22 NOTE — Progress Notes (Signed)
Primary Care Physician: Tonia Ghent, MD Primary Cardiologist: Dr Oval Linsey  Primary Electrophysiologist: Dr Curt Bears Referring Physician: Dr Doristine Mango SAYON PRIDE is a 77 y.o. male with a history of HTN, HLD, atrial myxoma s/p excision 2013, TIA, atrial fibrillation who presents for follow up in the [facility].  He had postoperative atrial fibrillation and had a cardioversion in 2014.  He was started on amiodarone which was subsequently discontinued. Patient is on Xarelto for a CHADS2VASC score of 5. Patient underwent DCCV on 03/20/22 but returned to afib before leaving the hospital. Patient is s/p DCCV on 04/12/22 which lasted at most 48 hours. Seen by Dr Curt Bears and underwent afib ablation on 09/19/22.  On follow up today, patient reports that his R groin site has been oozing blood since leaving the hospital. He has been changing his dressing multiple times and his son who is an EMT has held pressure twice for 20 minutes without improvement. No tenderness, swelling, or infectious symptoms. He has been compliant with his groin precautions.   Today, he denies symptoms of palpitations, chest pain, shortness of breath, orthopnea, PND, lower extremity edema, dizziness, presyncope, syncope, snoring, daytime somnolence, bleeding, or neurologic sequela. The patient is tolerating medications without difficulties and is otherwise without complaint today.    Atrial Fibrillation Risk Factors:  he does not have symptoms or diagnosis of sleep apnea. he does not have a history of rheumatic fever.   he has a BMI of There is no height or weight on file to calculate BMI.. There were no vitals filed for this visit.   Family History  Problem Relation Age of Onset   Colon cancer Mother        patient reported that his mother didn't have colon cancer.   Liver cancer Mother    Cancer Father    Brain cancer Father        glioblastoma   Prostate cancer Neg Hx       Atrial Fibrillation Management history:  Previous antiarrhythmic drugs: amiodarone, flecainide  Previous cardioversions: 2014, 06/23/21, 03/20/22, 04/12/22 Previous ablations: none CHADS2VASC score: 5 Anticoagulation history: Xarelto    Past Medical History:  Diagnosis Date   Ascending aortic aneurysm ([place]) 09/02/2021   Atrial fibrillation, currently in sinus rhythm    Atrial myxoma    Cervical osteoarthritis    History of TIA (transient ischemic attack)    History of tinnitus    Hyperlipidemia    Hypertension    Melanoma ([place])    per Dr. Allyson Sabal, local excision, no chemo/no rady tx.    Past Surgical History:  Procedure Laterality Date   ANKLE ARTHROSCOPY WITH ARTHRODESIS Left 08/26/2015   Procedure: LEFT ANKLE ARTHROSCOPY TIBIOTALAR ARTHRODESIS;  Surgeon: [name], MD;  Location: [place];  Service: Orthopedics;  Laterality: Left;   APPENDECTOMY     ATRIAL FIBRILLATION ABLATION N/A 09/19/2022   Procedure: ATRIAL FIBRILLATION ABLATION;  Surgeon: Constance Haw, MD;  Location: [place] CV LAB;  Service: Cardiovascular;  Laterality: N/A;   BACK SURGERY     CARDIOVERSION N/A 06/25/2013   Procedure: CARDIOVERSION;  Surgeon: [name], MD;  Location: [facility] ENDOSCOPY;  Service: Cardiovascular;  Laterality: N/A;   CARDIOVERSION N/A 06/23/2021   Procedure: CARDIOVERSION;  Surgeon: Thayer Headings, MD;  Location: [facility] ENDOSCOPY;  Service: Cardiovascular;  Laterality: N/A;   CARDIOVERSION N/A 03/20/2022   Procedure: CARDIOVERSION;  Surgeon: Skeet Latch, MD;  Location: [place];  Service: Cardiovascular;  Laterality: N/A;   CARDIOVERSION N/A 04/12/2022   Procedure: CARDIOVERSION;  Surgeon: Elouise Munroe, MD;  [facility] ENDOSCOPY;  Service: Cardiovascular;  Laterality: N/A;   EXCISION OF ATRIAL MYXOMA Left 04/09/2013   Procedure: EXCISION OF ATRIAL MYXOMA;  Surgeon: [name], MD;  Location: [place];  Service: Open Heart  Surgery;  Laterality: Left;   INTRAOPERATIVE TRANSESOPHAGEAL ECHOCARDIOGRAM N/A 04/09/2013   Procedure: INTRAOPERATIVE TRANSESOPHAGEAL ECHOCARDIOGRAM;  Surgeon: [name], MD;  Location: [place];  Service: Open Heart Surgery;  Laterality: N/A;   LEFT HEART CATHETERIZATION WITH CORONARY ANGIOGRAM N/A 04/07/2013   Procedure: LEFT HEART CATHETERIZATION WITH CORONARY ANGIOGRAM;  Surgeon: [name], MD;  [facility] C[facility] LAB;  Service: Cardiovascular;  Laterality: N/A;   NASAL SEPTUM SURGERY     NOSE SURGERY     ROTATOR CUFF REPAIR     both shoulders    Current Outpatient Medications  Medication Sig Dispense Refill   acetaminophen (TYLENOL) 500 MG tablet Take 1,000 mg by mouth every 6 (six) hours as needed for moderate pain.     atorvastatin (LIPITOR) 80 MG tablet TAKE 1/2 TABLET (40 MG TOTAL) BY MOUTH DAILY. (Patient taking differently: Take 80 mg by mouth at bedtime.) 45 tablet 3   Camphor-Menthol-Methyl Sal (MUSCLE RUB ULTRA STRENGTH EX) Apply 1 Application topically daily as needed (pain).     hydrochlorothiazide (HYDRODIURIL) 25 MG tablet TAKE 1 TABLET (25 MG TOTAL) BY MOUTH DAILY. 90 tablet 3   levothyroxine (SYNTHROID) 25 MCG tablet Take 1 tablet (25 mcg total) by mouth daily. 90 tablet 1   metoprolol tartrate (LOPRESSOR) 25 MG tablet TAKE 1 TABLET BY MOUTH 2 TIMES DAILY. (Patient taking differently: Take 12.5 mg by mouth 2 (two) times daily.) 180 tablet 3   Multiple Vitamin (MULTIVITAMIN WITH MINERALS) TABS tablet Take 1 tablet by mouth daily.     potassium chloride SA (KLOR-CON M) 20 MEQ tablet Take 1 (20 mg) tablet by mouth in the morning and 2 (40 mg) tablets at bedtime (Patient taking differently: Take 40 meq in the morning and 20 meq at night) 270 tablet 3   Saw Palmetto 450 MG CAPS Take 450 mg by mouth 2 (two) times daily.     XARELTO 20 MG TABS tablet TAKE 1 TABLET BY MOUTH DAILY WITH SUPPER. 30 tablet 5   No current facility-administered medications for this  encounter.    No Known Allergies  Social History   Socioeconomic History   Marital status: Married    Spouse name: Not on file   Number of children: Not on file   Years of education: Not on file   Highest education level: Not on file  Occupational History   Not on file  Tobacco Use   Smoking status: Former    Types: Cigarettes    Quit date: 07/10/1977    Years since quitting: 45.2   Smokeless tobacco: Never   Tobacco comments:    Former smoker 04/20/22  Substance and Sexual Activity   Alcohol use: Yes    Comment: rare   Drug use: No   Sexual activity: Not on file  Other Topics Concern   Not on file  Social History Narrative   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   Social Determinants of Health   Financial Resource Strain: [place]  (11/09/2021)   Overall Financial Resource Strain (CARDIA)    Difficulty of Paying Living Expenses: Not hard at all  Food Insecurity: No [place] (11/09/2021)   Hunger  Vital Sign    Worried About Charity fundraiser in the Last Year: Never true    Ran Out of Food in the Last Year: Never true  Transportation Needs: No Transportation Needs (11/09/2021)   PRAPARE - Hydrologist (Medical): No    Lack of Transportation (Non-Medical): No  Physical Activity: Sufficiently Active (11/09/2021)   Exercise Vital Sign    Days of Exercise per Week: 5 days    Minutes of Exercise per Session: 30 min  Stress: No Stress Concern Present (11/09/2021)   [place]    Feeling of Stress : Not at all  Social Connections: [place] (11/09/2021)   Social Connection and Isolation Panel [NHANES]    Frequency of Communication with Friends and Family: More than three times a week    Frequency of Social Gatherings with Friends and Family: More than three times a week    Attends Religious Services: More than 4 times per year    Active Member of [employer] or Organizations: Yes     Attends Music therapist: More than 4 times per year    Marital Status: Married  Human resources officer Violence: Not At Risk (11/09/2021)   Humiliation, Afraid, Rape, and Kick questionnaire    Fear of Current or Ex-Partner: No    Emotionally Abused: No    Physically Abused: No    Sexually Abused: No     ROS- All systems are reviewed and negative except as per the HPI above.  Physical Exam: There were no vitals filed for this visit.   GEN- The patient is a well appearing male, alert and oriented x 3 today.   HEENT-head normocephalic, atraumatic, sclera clear, conjunctiva pink, hearing intact, trachea midline. Lungs- Clear to ausculation bilaterally, normal work of breathing Heart- Regular rate and rhythm, no murmurs, rubs or gallops  GI- soft, NT, ND, + BS Extremities- no clubbing, cyanosis, or edema, blood oozing from R cath site.  MS- no significant deformity or atrophy Skin- no rash or lesion Psych- euthymic mood, full affect Neuro- strength and sensation are intact   Wt Readings from Last 3 Encounters:  09/19/22 99.8 kg  07/20/22 101.6 kg  05/08/22 101.9 kg    EKG is not ordered today.  Echo 06/27/21 demonstrated   1. Left ventricular ejection fraction, by estimation, is 50 to 55%. The  left ventricle has low normal function. The left ventricle demonstrates  global hypokinesis. The left ventricular internal cavity size was mildly  dilated. Left ventricular diastolic parameters are indeterminate.   2. Right ventricular systolic function is normal. The right ventricular  size is normal.   3. Left atrial size was severely dilated.   4. Right atrial size was severely dilated.   5. The mitral valve is grossly normal. Mild mitral valve regurgitation.  No evidence of mitral stenosis.   6. The aortic valve is normal in structure. Aortic valve regurgitation is  not visualized. No aortic stenosis is present.   7. Aortic dilatation noted. There is borderline dilatation  of the  ascending aorta, measuring 38 mm.   Comparison(s): EF 50%.   Epic records are reviewed at length today  CHA2DS2-VASc Score = 5  The patient's score is based upon: CHF History: 0 HTN History: 1 Diabetes History: 0 Stroke History: 2 (TIA) Vascular Disease History: 0 Age Score: 2 Gender Score: 0       ASSESSMENT AND PLAN: 1. Persistent Atrial Fibrillation (  ICD10:  I48.19) The patient's CHA2DS2-VASc score is 5, indicating a 7.2% annual risk of stroke.   Patient is s/p afib ablation 09/19/22 Patient oozing from R cath site. D/w Dr Curt Bears and seen with Oda Kilts PA. Site injected with lidocaine/epinephrine, oozing stopped, new dressing applied. Groin precautions discussed again.  Check cbc today.  With his h/o atrial myxoma surgery, he made need chronic AAD.  Continue Lopressor 25 mg BID Continue Xarelto 20 mg daily with no missed doses for 3 months post ablation.   2. Secondary Hypercoagulable State (ICD10:  D68.69) The patient is at significant risk for stroke/thromboembolism based upon his CHA2DS2-VASc Score of 5.  Continue Rivaroxaban (Xarelto).     Follow up in the AF clinic as scheduled in one month.    [facility] [address] [place], [place] [address] [phone] 09/22/2022 3:12 PM

## 2022-10-17 ENCOUNTER — Encounter (HOSPITAL_COMMUNITY): Payer: Self-pay | Admitting: Physician Assistant

## 2022-10-17 ENCOUNTER — Ambulatory Visit (HOSPITAL_COMMUNITY)
Admission: RE | Admit: 2022-10-17 | Discharge: 2022-10-17 | Disposition: A | Discharge status: Home / Self Care | Payer: Medicare HMO | Source: Ambulatory Visit | Attending: Physician Assistant | Admitting: Physician Assistant

## 2022-10-17 VITALS — BP 134/94 | HR 96 | Ht 72.0 in | Wt 222.6 lb

## 2022-10-17 DIAGNOSIS — I4819 Other persistent atrial fibrillation: Secondary | ICD-10-CM

## 2022-10-17 DIAGNOSIS — D6869 Other thrombophilia: Secondary | ICD-10-CM | POA: Diagnosis not present

## 2022-10-17 DIAGNOSIS — Z006 Encounter for examination for normal comparison and control in clinical research program: Secondary | ICD-10-CM

## 2022-10-17 NOTE — Patient Instructions (Signed)
Cardioversion scheduled for: Monday. April 22nd   - Arrive at the [facility] and go to admitting at [facility] not eat or drink anything after midnight the night prior to your procedure.   - Take all your morning medication (except diabetic medications) with a sip of water prior to arrival.  - You will not be able to drive home after your procedure.    - Do NOT miss any doses of your blood thinner - if you should miss a dose please notify our office immediately.   - If you feel as if you go back into normal rhythm prior to scheduled cardioversion, please notify our office immediately.   If your procedure is canceled in the cardioversion suite you will be charged a cancellation fee.

## 2022-10-17 NOTE — H&P (View-Only) (Signed)
Primary Care Physician: [name], MD Primary Cardiologist: Dr Duke Salvia  Primary Electrophysiologist: Dr Elberta Fortis Referring Physician: Dr Iran Sizer Matthew Sullivan is a 77 y.o. male with a history of HTN, HLD, atrial myxoma s/p excision 2013, TIA, atrial fibrillation who presents for follow up in the [facility].  He had postoperative atrial fibrillation and had a cardioversion in 2014.  He was started on amiodarone which was subsequently discontinued. Patient is on Xarelto for a CHADS2VASC score of 5. Patient underwent DCCV on 03/20/22 but returned to afib before leaving the hospital. Patient is s/p DCCV on 04/12/22 which lasted at most 48 hours. Seen by Dr Elberta Fortis and underwent afib ablation on 09/19/22.  Patient had R groin site oozing after leaving the hospital. He had been changing his dressing multiple times and his son who is an EMT held pressure twice for 20 minutes without improvement. He came into the office 09/22/22 and was injected with lidocaine with epi which resolved the oozing.   On follow up today, patient reports that he has felt fatigued since the ablation. He is in afib today. He believes he has been persistently in afib since soon after the ablation. He denies chest pain or swallowing pain. Groin issues resolved.   Today, he denies symptoms of palpitations, chest pain, shortness of breath, orthopnea, PND, lower extremity edema, dizziness, presyncope, syncope, snoring, daytime somnolence, bleeding, or neurologic sequela. The patient is tolerating medications without difficulties and is otherwise without complaint today.    Atrial Fibrillation Risk Factors:  he does not have symptoms or diagnosis of sleep apnea. he does not have a history of rheumatic fever.   he has a BMI of Body mass index is 30.19 kg/m.Marland Kitchen Filed Weights   10/17/22 1059  Weight: 101 kg    Family History  Problem Relation Age of Onset   Colon cancer Mother         patient reported that his mother didn't have colon cancer.   Liver cancer Mother    Cancer Father    Brain cancer Father        glioblastoma   Prostate cancer Neg Hx      Atrial Fibrillation Management history:  Previous antiarrhythmic drugs: amiodarone, flecainide  Previous cardioversions: 2014, 06/23/21, 03/20/22, 04/12/22 Previous ablations: none Anticoagulation history: Xarelto    Past Medical History:  Diagnosis Date   Ascending aortic aneurysm 09/02/2021   Atrial fibrillation, currently in sinus rhythm    Atrial myxoma    Cervical osteoarthritis    History of TIA (transient ischemic attack)    History of tinnitus    Hyperlipidemia    Hypertension    Melanoma    per Dr. Terri Piedra, local excision, no chemo/no rady tx.    Past Surgical History:  Procedure Laterality Date   ANKLE ARTHROSCOPY WITH ARTHRODESIS Left 08/26/2015   Procedure: LEFT ANKLE ARTHROSCOPY TIBIOTALAR ARTHRODESIS;  Surgeon: Loreta Ave, MD;  Location: [facility];  Service: Orthopedics;  Laterality: Left;   APPENDECTOMY     ATRIAL FIBRILLATION ABLATION N/A 09/19/2022   Procedure: ATRIAL FIBRILLATION ABLATION;  Surgeon: Regan Lemming, MD;  Location: [facility] INVASIVE CV LAB;  Service: Cardiovascular;  Laterality: N/A;   BACK SURGERY     CARDIOVERSION N/A 06/25/2013   Procedure: CARDIOVERSION;  Surgeon: Cassell Clement, MD;  Location: [facility] ENDOSCOPY;  Service: Cardiovascular;  Laterality: N/A;   CARDIOVERSION N/A 06/23/2021   Procedure: CARDIOVERSION;  Surgeon: Vesta Mixer, MD;  Location: [facility] ENDOSCOPY;  Service: Cardiovascular;  Laterality: N/A;   CARDIOVERSION N/A 03/20/2022   Procedure: CARDIOVERSION;  Surgeon: Chilton Si, MD;  Location: [facility] ENDOSCOPY;  Service: Cardiovascular;  Laterality: N/A;   CARDIOVERSION N/A 04/12/2022   Procedure: CARDIOVERSION;  Surgeon: Parke Poisson, MD;  Location: [facility] ENDOSCOPY;  Service: Cardiovascular;  Laterality: N/A;   EXCISION OF ATRIAL MYXOMA  Left 04/09/2013   Procedure: EXCISION OF ATRIAL MYXOMA;  Surgeon: Loreli Slot, MD;  Location: [facility] OR;  Service: Open Heart Surgery;  Laterality: Left;   INTRAOPERATIVE TRANSESOPHAGEAL ECHOCARDIOGRAM N/A 04/09/2013   Procedure: INTRAOPERATIVE TRANSESOPHAGEAL ECHOCARDIOGRAM;  Surgeon: Loreli Slot, MD;  Location: [facility] OR;  Service: Open Heart Surgery;  Laterality: N/A;   LEFT HEART CATHETERIZATION WITH CORONARY ANGIOGRAM N/A 04/07/2013   Procedure: LEFT HEART CATHETERIZATION WITH CORONARY ANGIOGRAM;  Surgeon: Micheline Chapman, MD;  Location: [facility] CATH LAB;  Service: Cardiovascular;  Laterality: N/A;   NASAL SEPTUM SURGERY     NOSE SURGERY     ROTATOR CUFF REPAIR     both shoulders    Current Outpatient Medications  Medication Sig Dispense Refill   acetaminophen (TYLENOL) 500 MG tablet Take 1,000 mg by mouth every 6 (six) hours as needed for moderate pain.     atorvastatin (LIPITOR) 80 MG tablet TAKE 1/2 TABLET (40 MG TOTAL) BY MOUTH DAILY. 45 tablet 3   Camphor-Menthol-Methyl Sal (MUSCLE RUB ULTRA STRENGTH EX) Apply 1 Application topically daily as needed (pain).     hydrochlorothiazide (HYDRODIURIL) 25 MG tablet TAKE 1 TABLET (25 MG TOTAL) BY MOUTH DAILY. 90 tablet 3   levothyroxine (SYNTHROID) 25 MCG tablet Take 1 tablet (25 mcg total) by mouth daily. 90 tablet 1   metoprolol tartrate (LOPRESSOR) 25 MG tablet TAKE 1 TABLET BY MOUTH 2 TIMES DAILY. (Patient taking differently: Take 12.5 mg by mouth 2 (two) times daily.) 180 tablet 3   Multiple Vitamin (MULTIVITAMIN WITH MINERALS) TABS tablet Take 1 tablet by mouth daily.     potassium chloride SA (KLOR-CON M) 20 MEQ tablet Take 1 (20 mg) tablet by mouth in the morning and 2 (40 mg) tablets at bedtime 270 tablet 3   Saw Palmetto 450 MG CAPS Take 450 mg by mouth 2 (two) times daily.     XARELTO 20 MG TABS tablet TAKE 1 TABLET BY MOUTH DAILY WITH SUPPER. 30 tablet 5   No current facility-administered medications for this encounter.     No Known Allergies  Social History   Socioeconomic History   Marital status: Married    Spouse name: Not on file   Number of children: Not on file   Years of education: Not on file   Highest education level: Not on file  Occupational History   Not on file  Tobacco Use   Smoking status: Former    Types: Cigarettes    Quit date: 07/10/1977    Years since quitting: 45.3   Smokeless tobacco: Never   Tobacco comments:    Former smoker 04/20/22  Substance and Sexual Activity   Alcohol use: Yes    Comment: rare   Drug use: No   Sexual activity: Not on file  Other Topics Concern   Not on file  Social History Narrative   Married Sales promotion account executive   Social Determinants of Health   Financial Resource Strain: Low Risk  (11/09/2021)   Overall Financial Resource Strain (CARDIA)    Difficulty of Paying Living Expenses: Not hard at all  Food Insecurity:  No Food Insecurity (11/09/2021)   Hunger Vital Sign    Worried About Running Out of Food in the Last Year: Never true    Ran Out of Food in the Last Year: Never true  Transportation Needs: No Transportation Needs (11/09/2021)   PRAPARE - Administrator, Civil Service (Medical): No    Lack of Transportation (Non-Medical): No  Physical Activity: Sufficiently Active (11/09/2021)   Exercise Vital Sign    Days of Exercise per Week: 5 days    Minutes of Exercise per Session: 30 min  Stress: No Stress Concern Present (11/09/2021)   [employer] of Occupational Health - Occupational Stress Questionnaire    Feeling of Stress : Not at all  Social Connections: Socially Integrated (11/09/2021)   Social Connection and Isolation Panel [NHANES]    Frequency of Communication with Friends and Family: More than three times a week    Frequency of Social Gatherings with Friends and Family: More than three times a week    Attends Religious Services: More than 4 times per year    Active Member of [employer] or Organizations: Yes    Attends Probation officer: More than 4 times per year    Marital Status: Married  Catering manager Violence: Not At Risk (11/09/2021)   Humiliation, Afraid, Rape, and Kick questionnaire    Fear of Current or Ex-Partner: No    Emotionally Abused: No    Physically Abused: No    Sexually Abused: No     ROS- All systems are reviewed and negative except as per the HPI above.  Physical Exam: Vitals:   10/17/22 1059  BP: (!) 134/94  Pulse: 96  Weight: 101 kg  Height: 6' (1.829 m)    GEN- The patient is a well appearing elderly male, alert and oriented x 3 today.   HEENT-head normocephalic, atraumatic, sclera clear, conjunctiva pink, hearing intact, trachea midline. Lungs- Clear to ausculation bilaterally, normal work of breathing Heart- irregular rate and rhythm, no murmurs, rubs or gallops  GI- soft, NT, ND, + BS Extremities- no clubbing, cyanosis, or edema MS- no significant deformity or atrophy Skin- no rash or lesion Psych- euthymic mood, full affect Neuro- strength and sensation are intact   Wt Readings from Last 3 Encounters:  10/17/22 101 kg  09/19/22 99.8 kg  07/20/22 101.6 kg    EKG today demonstrates Afib Vent. rate 96 BPM PR interval * ms QRS duration 84 ms QT/QTcB 378/477 ms   Echo 06/27/21 demonstrated   1. Left ventricular ejection fraction, by estimation, is 50 to 55%. The  left ventricle has low normal function. The left ventricle demonstrates  global hypokinesis. The left ventricular internal cavity size was mildly  dilated. Left ventricular diastolic parameters are indeterminate.   2. Right ventricular systolic function is normal. The right ventricular  size is normal.   3. Left atrial size was severely dilated.   4. Right atrial size was severely dilated.   5. The mitral valve is grossly normal. Mild mitral valve regurgitation.  No evidence of mitral stenosis.   6. The aortic valve is normal in structure. Aortic valve regurgitation is  not  visualized. No aortic stenosis is present.   7. Aortic dilatation noted. There is borderline dilatation of the  ascending aorta, measuring 38 mm.   Comparison(s): EF 50%.   Epic records are reviewed at length today  CHA2DS2-VASc Score = 6  The patient's score is based upon: CHF History: 0 HTN History: 1  Diabetes History: 0 Stroke History: 2 (TIA) Vascular Disease History: 1 Age Score: 2 Gender Score: 0       ASSESSMENT AND PLAN: 1. Persistent Atrial Fibrillation (ICD10:  I48.19) The patient's CHA2DS2-VASc score is 6, indicating a 9.7% annual risk of stroke.   Patient is s/p afib ablation 09/19/22 He is back in persistent atrial fibrillation. We discussed rhythm control options. Would not resume flecainide with elevated CAC score on CT. Could consider dofetilide (QT 450s-470s in SR) or amiodarone.  Patient would like to try DCCV alone for now. Will arrange.  Continue Lopressor 25 mg BID Continue Xarelto 20 mg daily with no missed doses for 3 months post ablation.   2. Secondary Hypercoagulable State (ICD10:  D68.69) The patient is at significant risk for stroke/thromboembolism based upon his CHA2DS2-VASc Score of 6.  Continue Rivaroxaban (Xarelto).   3. CAD CAC score 636 On statin No anginal symptoms.   Follow up in the AF clinic post DCCV.    Matthew Sullivan Matthew Canady PA-C [facility] [address] [place], [place] [address] [phone] 10/17/2022 11:46 AM

## 2022-10-17 NOTE — Research (Signed)
MASIMO Informed Consent   Subject Name: [name]  Subject met inclusion and exclusion criteria.  The informed consent form, study requirements and expectations were reviewed with the subject and questions and concerns were addressed prior to the signing of the consent form.  The subject verbalized understanding of the trial requirements.  The subject agreed to participate in the Masimo trial and signed the informed consent on 9/Apr/2024.  The informed consent was obtained prior to performance of any protocol-specific procedures for the subject.  A copy of the signed informed consent was given to the subject and a copy was placed in the subject's medical record.   Kandis Mannan Maddalena Linarez

## 2022-10-17 NOTE — Progress Notes (Signed)
  Primary Care Physician: Duncan, Graham S, MD Primary Cardiologist: Dr Munich  Primary Electrophysiologist: Dr Camnitz Referring Physician: Dr [place]   Matthew Sullivan is a 76 y.o. male with a history of HTN, HLD, atrial myxoma s/p excision 2013, TIA, atrial fibrillation who presents for follow up in the [facility].  He had postoperative atrial fibrillation and had a cardioversion in 2014.  He was started on amiodarone which was subsequently discontinued. Patient is on Xarelto for a CHADS2VASC score of 5. Patient underwent DCCV on 03/20/22 but returned to afib before leaving the hospital. Patient is s/p DCCV on 04/12/22 which lasted at most 48 hours. Seen by Dr Camnitz and underwent afib ablation on 09/19/22.  Patient had R groin site oozing after leaving the hospital. He had been changing his dressing multiple times and his son who is an EMT held pressure twice for 20 minutes without improvement. He came into the office 09/22/22 and was injected with lidocaine with epi which resolved the oozing.   On follow up today, patient reports that he has felt fatigued since the ablation. He is in afib today. He believes he has been persistently in afib since soon after the ablation. He denies chest pain or swallowing pain. Groin issues resolved.   Today, he denies symptoms of palpitations, chest pain, shortness of breath, orthopnea, PND, lower extremity edema, dizziness, presyncope, syncope, snoring, daytime somnolence, bleeding, or neurologic sequela. The patient is tolerating medications without difficulties and is otherwise without complaint today.    Atrial Fibrillation Risk Factors:  he does not have symptoms or diagnosis of sleep apnea. he does not have a history of rheumatic fever.   he has a BMI of Body mass index is 30.19 kg/m.. Filed Weights   10/17/22 1059  Weight: 101 kg    Family History  Problem Relation Age of Onset   Colon cancer Mother         patient reported that his mother didn't have colon cancer.   Liver cancer Mother    Cancer Father    Brain cancer Father        glioblastoma   Prostate cancer Neg Hx      Atrial Fibrillation Management history:  Previous antiarrhythmic drugs: amiodarone, flecainide  Previous cardioversions: 2014, 06/23/21, 03/20/22, 04/12/22 Previous ablations: none Anticoagulation history: Xarelto    Past Medical History:  Diagnosis Date   Ascending aortic aneurysm 09/02/2021   Atrial fibrillation, currently in sinus rhythm    Atrial myxoma    Cervical osteoarthritis    History of TIA (transient ischemic attack)    History of tinnitus    Hyperlipidemia    Hypertension    Melanoma    per Dr. Lupton, local excision, no chemo/no rady tx.    Past Surgical History:  Procedure Laterality Date   ANKLE ARTHROSCOPY WITH ARTHRODESIS Left 08/26/2015   Procedure: LEFT ANKLE ARTHROSCOPY TIBIOTALAR ARTHRODESIS;  Surgeon: Daniel F Murphy, MD;  Location: [facility];  Service: Orthopedics;  Laterality: Left;   APPENDECTOMY     ATRIAL FIBRILLATION ABLATION N/A 09/19/2022   Procedure: ATRIAL FIBRILLATION ABLATION;  Surgeon: Camnitz, Will Martin, MD;  Location: [facility];  Service: Cardiovascular;  Laterality: N/A;   BACK SURGERY     CARDIOVERSION N/A 06/25/2013   Procedure: CARDIOVERSION;  Surgeon: Thomas Brackbill, MD;  Location: [facility];  Service: Cardiovascular;  Laterality: N/A;   CARDIOVERSION N/A 06/23/2021   Procedure: CARDIOVERSION;  Surgeon: Nahser, Philip J, MD;    Location: [facility];  Service: Cardiovascular;  Laterality: N/A;   CARDIOVERSION N/A 03/20/2022   Procedure: CARDIOVERSION;  Surgeon: Ali Chukson, Tiffany, MD;  Location: [facility];  Service: Cardiovascular;  Laterality: N/A;   CARDIOVERSION N/A 04/12/2022   Procedure: CARDIOVERSION;  Surgeon: Acharya, Gayatri A, MD;  Location: [facility];  Service: Cardiovascular;  Laterality: N/A;   EXCISION OF ATRIAL MYXOMA  Left 04/09/2013   Procedure: EXCISION OF ATRIAL MYXOMA;  Surgeon: Steven C Hendrickson, MD;  Location: [facility];  Service: Open Heart Surgery;  Laterality: Left;   INTRAOPERATIVE TRANSESOPHAGEAL ECHOCARDIOGRAM N/A 04/09/2013   Procedure: INTRAOPERATIVE TRANSESOPHAGEAL ECHOCARDIOGRAM;  Surgeon: Steven C Hendrickson, MD;  Location: [facility];  Service: Open Heart Surgery;  Laterality: N/A;   LEFT HEART CATHETERIZATION WITH CORONARY ANGIOGRAM N/A 04/07/2013   Procedure: LEFT HEART CATHETERIZATION WITH CORONARY ANGIOGRAM;  Surgeon: Michael D Cooper, MD;  Location: [facility];  Service: Cardiovascular;  Laterality: N/A;   NASAL SEPTUM SURGERY     NOSE SURGERY     ROTATOR CUFF REPAIR     both shoulders    Current Outpatient Medications  Medication Sig Dispense Refill   acetaminophen (TYLENOL) 500 MG tablet Take 1,000 mg by mouth every 6 (six) hours as needed for moderate pain.     atorvastatin (LIPITOR) 80 MG tablet TAKE 1/2 TABLET (40 MG TOTAL) BY MOUTH DAILY. 45 tablet 3   Camphor-Menthol-Methyl Sal (MUSCLE RUB ULTRA STRENGTH EX) Apply 1 Application topically daily as needed (pain).     hydrochlorothiazide (HYDRODIURIL) 25 MG tablet TAKE 1 TABLET (25 MG TOTAL) BY MOUTH DAILY. 90 tablet 3   levothyroxine (SYNTHROID) 25 MCG tablet Take 1 tablet (25 mcg total) by mouth daily. 90 tablet 1   metoprolol tartrate (LOPRESSOR) 25 MG tablet TAKE 1 TABLET BY MOUTH 2 TIMES DAILY. (Patient taking differently: Take 12.5 mg by mouth 2 (two) times daily.) 180 tablet 3   Multiple Vitamin (MULTIVITAMIN WITH MINERALS) TABS tablet Take 1 tablet by mouth daily.     potassium chloride SA (KLOR-CON M) 20 MEQ tablet Take 1 (20 mg) tablet by mouth in the morning and 2 (40 mg) tablets at bedtime 270 tablet 3   Saw Palmetto 450 MG CAPS Take 450 mg by mouth 2 (two) times daily.     XARELTO 20 MG TABS tablet TAKE 1 TABLET BY MOUTH DAILY WITH SUPPER. 30 tablet 5   No current facility-administered medications for this encounter.     No Known Allergies  Social History   Socioeconomic History   Marital status: Married    Spouse name: Not on file   Number of children: Not on file   Years of education: Not on file   Highest education level: Not on file  Occupational History   Not on file  Tobacco Use   Smoking status: Former    Types: Cigarettes    Quit date: 07/10/1977    Years since quitting: 45.3   Smokeless tobacco: Never   Tobacco comments:    Former smoker 04/20/22  Substance and Sexual Activity   Alcohol use: Yes    Comment: rare   Drug use: No   Sexual activity: Not on file  Other Topics Concern   Not on file  Social History Narrative   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   Social Determinants of Health   Financial Resource Strain: Low Risk  (11/09/2021)   Overall Financial Resource Strain (CARDIA)    Difficulty of Paying Living Expenses: Not hard at all  Food Insecurity:   No Food Insecurity (11/09/2021)   Hunger Vital Sign    Worried About Running Out of Food in the Last Year: Never true    Ran Out of Food in the Last Year: Never true  Transportation Needs: No Transportation Needs (11/09/2021)   PRAPARE - Transportation    Lack of Transportation (Medical): No    Lack of Transportation (Non-Medical): No  Physical Activity: Sufficiently Active (11/09/2021)   Exercise Vital Sign    Days of Exercise per Week: 5 days    Minutes of Exercise per Session: 30 min  Stress: No Stress Concern Present (11/09/2021)   [employer] - Occupational Stress Questionnaire    Feeling of Stress : Not at all  Social Connections: Socially Integrated (11/09/2021)   Social Connection and Isolation Panel [NHANES]    Frequency of Communication with Friends and Family: More than three times a week    Frequency of Social Gatherings with Friends and Family: More than three times a week    Attends Religious Services: More than 4 times per year    Active Member of Clubs or Organizations: Yes    Attends Club  or Organization Meetings: More than 4 times per year    Marital Status: Married  Intimate Partner Violence: Not At Risk (11/09/2021)   Humiliation, Afraid, Rape, and Kick questionnaire    Fear of Current or Ex-Partner: No    Emotionally Abused: No    Physically Abused: No    Sexually Abused: No     ROS- All systems are reviewed and negative except as per the HPI above.  Physical Exam: Vitals:   10/17/22 1059  BP: (!) 134/94  Pulse: 96  Weight: 101 kg  Height: 6' (1.829 m)    GEN- The patient is a well appearing elderly male, alert and oriented x 3 today.   HEENT-head normocephalic, atraumatic, sclera clear, conjunctiva pink, hearing intact, trachea midline. Lungs- Clear to ausculation bilaterally, normal work of breathing Heart- irregular rate and rhythm, no murmurs, rubs or gallops  GI- soft, NT, ND, + BS Extremities- no clubbing, cyanosis, or edema MS- no significant deformity or atrophy Skin- no rash or lesion Psych- euthymic mood, full affect Neuro- strength and sensation are intact   Wt Readings from Last 3 Encounters:  10/17/22 101 kg  09/19/22 99.8 kg  07/20/22 101.6 kg    EKG today demonstrates Afib Vent. rate 96 BPM PR interval * ms QRS duration 84 ms QT/QTcB 378/477 ms   Echo 06/27/21 demonstrated   1. Left ventricular ejection fraction, by estimation, is 50 to 55%. The  left ventricle has low normal function. The left ventricle demonstrates  global hypokinesis. The left ventricular internal cavity size was mildly  dilated. Left ventricular diastolic parameters are indeterminate.   2. Right ventricular systolic function is normal. The right ventricular  size is normal.   3. Left atrial size was severely dilated.   4. Right atrial size was severely dilated.   5. The mitral valve is grossly normal. Mild mitral valve regurgitation.  No evidence of mitral stenosis.   6. The aortic valve is normal in structure. Aortic valve regurgitation is  not  visualized. No aortic stenosis is present.   7. Aortic dilatation noted. There is borderline dilatation of the  ascending aorta, measuring 38 mm.   Comparison(s): EF 50%.   Epic records are reviewed at length today  CHA2DS2-VASc Score = 6  The patient's score is based upon: CHF History: 0 HTN History: 1   Diabetes History: 0 Stroke History: 2 (TIA) Vascular Disease History: 1 Age Score: 2 Gender Score: 0       ASSESSMENT AND PLAN: 1. Persistent Atrial Fibrillation (ICD10:  I48.19) The patient's CHA2DS2-VASc score is 6, indicating a 9.7% annual risk of stroke.   Patient is s/p afib ablation 09/19/22 He is back in persistent atrial fibrillation. We discussed rhythm control options. Would not resume flecainide with elevated CAC score on CT. Could consider dofetilide (QT 450s-470s in SR) or amiodarone.  Patient would like to try DCCV alone for now. Will arrange.  Continue Lopressor 25 mg BID Continue Xarelto 20 mg daily with no missed doses for 3 months post ablation.   2. Secondary Hypercoagulable State (ICD10:  D68.69) The patient is at significant risk for stroke/thromboembolism based upon his CHA2DS2-VASc Score of 6.  Continue Rivaroxaban (Xarelto).   3. CAD CAC score 636 On statin No anginal symptoms.   Follow up in the AF clinic post DCCV.    Ricky Aanvi Voyles PA-C [facility] [address] [place], [place] [address] [phone] 10/17/2022 11:46 AM 

## 2022-10-27 ENCOUNTER — Ambulatory Visit (HOSPITAL_COMMUNITY)
Admission: RE | Admit: 2022-10-27 | Discharge: 2022-10-27 | Disposition: A | Discharge status: Home / Self Care | Payer: Medicare HMO | Source: Ambulatory Visit | Attending: Physician Assistant | Admitting: Physician Assistant

## 2022-10-27 DIAGNOSIS — I4819 Other persistent atrial fibrillation: Secondary | ICD-10-CM | POA: Insufficient documentation

## 2022-10-27 LAB — CBC
HCT: 41.5 % (ref 39.0–52.0)
Hemoglobin: 13.4 g/dL (ref 13.0–17.0)
MCH: 30.7 pg (ref 26.0–34.0)
MCHC: 32.3 g/dL (ref 30.0–36.0)
MCV: 95.2 fL (ref 80.0–100.0)
Platelets: 182 10*3/uL (ref 150–400)
RBC: 4.36 MIL/uL (ref 4.22–5.81)
RDW: 13.5 % (ref 11.5–15.5)
WBC: 6 10*3/uL (ref 4.0–10.5)
nRBC: 0 % (ref 0.0–0.2)

## 2022-10-27 LAB — BASIC METABOLIC PANEL
Anion gap: 9 (ref 5–15)
BUN: 14 mg/dL (ref 8–23)
CO2: 26 mmol/L (ref 22–32)
Calcium: 8.5 mg/dL — ABNORMAL LOW (ref 8.9–10.3)
Chloride: 102 mmol/L (ref 98–111)
Creatinine, Ser: 1.03 mg/dL (ref 0.61–1.24)
GFR, Estimated: >60 mL/min (ref >60)
Glucose, Bld: 142 mg/dL — ABNORMAL HIGH (ref 70–99)
Potassium: 3.6 mmol/L (ref 3.5–5.1)
Sodium: 137 mmol/L (ref 135–145)

## 2022-10-27 NOTE — Progress Notes (Signed)
Msg left, npo after midnight, bp and blood thinner meds in am with sips of water, be at hospital at 0900 for procedure at 1000, please have transportation for home after procedure

## 2022-10-30 ENCOUNTER — Ambulatory Visit (HOSPITAL_COMMUNITY): Payer: Medicare HMO | Admitting: Certified Registered Nurse Anesthetist

## 2022-10-30 ENCOUNTER — Ambulatory Visit (HOSPITAL_COMMUNITY)
Admission: RE | Admit: 2022-10-30 | Discharge: 2022-10-30 | Disposition: A | Discharge status: Home / Self Care | Payer: Medicare HMO | Source: Ambulatory Visit | Attending: Internal Medicine | Admitting: Internal Medicine

## 2022-10-30 ENCOUNTER — Encounter (HOSPITAL_COMMUNITY): Payer: Self-pay | Admitting: Internal Medicine

## 2022-10-30 ENCOUNTER — Ambulatory Visit (HOSPITAL_BASED_OUTPATIENT_CLINIC_OR_DEPARTMENT_OTHER): Payer: Medicare HMO | Admitting: Certified Registered Nurse Anesthetist

## 2022-10-30 ENCOUNTER — Encounter (HOSPITAL_COMMUNITY)
Admission: RE | Disposition: A | Discharge status: Home / Self Care | Payer: Self-pay | Source: Ambulatory Visit | Attending: Internal Medicine

## 2022-10-30 ENCOUNTER — Other Ambulatory Visit: Payer: Self-pay

## 2022-10-30 DIAGNOSIS — I4819 Other persistent atrial fibrillation: Secondary | ICD-10-CM | POA: Diagnosis not present

## 2022-10-30 DIAGNOSIS — I2510 Atherosclerotic heart disease of native coronary artery without angina pectoris: Secondary | ICD-10-CM | POA: Insufficient documentation

## 2022-10-30 DIAGNOSIS — D6869 Other thrombophilia: Secondary | ICD-10-CM | POA: Diagnosis not present

## 2022-10-30 DIAGNOSIS — I4891 Unspecified atrial fibrillation: Secondary | ICD-10-CM

## 2022-10-30 DIAGNOSIS — Z87891 Personal history of nicotine dependence: Secondary | ICD-10-CM | POA: Diagnosis not present

## 2022-10-30 DIAGNOSIS — I252 Old myocardial infarction: Secondary | ICD-10-CM

## 2022-10-30 DIAGNOSIS — Z8673 Personal history of transient ischemic attack (TIA), and cerebral infarction without residual deficits: Secondary | ICD-10-CM | POA: Diagnosis not present

## 2022-10-30 DIAGNOSIS — Z79899 Other long term (current) drug therapy: Secondary | ICD-10-CM | POA: Diagnosis not present

## 2022-10-30 DIAGNOSIS — I10 Essential (primary) hypertension: Secondary | ICD-10-CM

## 2022-10-30 DIAGNOSIS — E785 Hyperlipidemia, unspecified: Secondary | ICD-10-CM | POA: Insufficient documentation

## 2022-10-30 DIAGNOSIS — Z7901 Long term (current) use of anticoagulants: Secondary | ICD-10-CM | POA: Diagnosis not present

## 2022-10-30 HISTORY — PX: CARDIOVERSION: SHX1299

## 2022-10-30 SURGERY — CARDIOVERSION
Anesthesia: General

## 2022-10-30 MED ORDER — SODIUM CHLORIDE 0.9 % IV SOLN: INTRAVENOUS | Status: DC

## 2022-10-30 MED ORDER — LIDOCAINE 2% (20 MG/ML) 5 ML SYRINGE
INTRAMUSCULAR | Status: DC | PRN
  Administered 2022-10-30: 60 mg via INTRAVENOUS

## 2022-10-30 MED ORDER — PROPOFOL 10 MG/ML IV BOLUS
INTRAVENOUS | Status: DC | PRN
  Administered 2022-10-30: 80 mg via INTRAVENOUS

## 2022-10-30 SURGICAL SUPPLY — 1 items: ELECT DEFIB PAD ADLT CADENCE (PAD) ×1 IMPLANT

## 2022-10-30 NOTE — Interval H&P Note (Signed)
History and Physical Interval Note:  10/30/2022 9:24 AM  Matthew Sullivan  has presented today for surgery, with the diagnosis of afib.  The various methods of treatment have been discussed with the patient and family. After consideration of risks, benefits and other options for treatment, the patient has consented to  Procedure(s): CARDIOVERSION (N/A) as a surgical intervention.  The patient's history has been reviewed, patient examined, no change in status, stable for surgery.  I have reviewed the patient's chart and labs.  Questions were answered to the patient's satisfaction.     [name]

## 2022-10-30 NOTE — Interval H&P Note (Signed)
History and Physical Interval Note:  10/30/2022 9:25 AM  Matthew Sullivan  has presented today for surgery, with the diagnosis of afib.  The various methods of treatment have been discussed with the patient and family. After consideration of risks, benefits and other options for treatment, the patient has consented to  Procedure(s): CARDIOVERSION (N/A) as a surgical intervention.  The patient's history has been reviewed, patient examined, no change in status, stable for surgery.  I have reviewed the patient's chart and labs.  Questions were answered to the patient's satisfaction.     [name]

## 2022-10-30 NOTE — Anesthesia Preprocedure Evaluation (Signed)
Anesthesia Evaluation  Patient identified by MRN, date of birth, ID band Patient awake    Reviewed: Allergy & Precautions, NPO status , Patient's Chart, lab work & pertinent test results, reviewed documented beta blocker date and time   Airway Mallampati: III  TM Distance: >3 FB Neck ROM: Full    Dental  (+) Teeth Intact, Dental Advisory Given   Pulmonary neg pulmonary ROS, former smoker   Pulmonary exam normal breath sounds clear to auscultation       Cardiovascular hypertension, Pt. on medications and Pt. on home beta blockers + Past MI  Normal cardiovascular exam+ dysrhythmias (on xarelto) Atrial Fibrillation  Rhythm:Regular Rate:Normal  TTE 2022 1. Left ventricular ejection fraction, by estimation, is 50 to 55%. The  left ventricle has low normal function. The left ventricle demonstrates  global hypokinesis. The left ventricular internal cavity size was mildly  dilated. Left ventricular diastolic  parameters are indeterminate.  2. Right ventricular systolic function is normal. The right ventricular  size is normal.  3. Left atrial size was severely dilated.  4. Right atrial size was severely dilated.  5. The mitral valve is grossly normal. Mild mitral valve regurgitation.  No evidence of mitral stenosis.  6. The aortic valve is normal in structure. Aortic valve regurgitation is  not visualized. No aortic stenosis is present.  7. Aortic dilatation noted. There is borderline dilatation of the  ascending aorta, measuring 38 mm.    Neuro/Psych TIA negative psych ROS   GI/Hepatic negative GI ROS, Neg liver ROS,,,  Endo/Other  Hypothyroidism    Renal/GU negative Renal ROS  negative genitourinary   Musculoskeletal  (+) Arthritis ,    Abdominal   Peds  Hematology negative hematology ROS (+)   Anesthesia Other Findings   Reproductive/Obstetrics                             Anesthesia  Physical Anesthesia Plan  ASA: 3  Anesthesia Plan: General   Post-op Pain Management:    Induction: Intravenous  PONV Risk Score and Plan: 2 and Propofol infusion and Treatment may vary due to age or medical condition  Airway Management Planned: Natural Airway, Mask and Simple Face Mask  Additional Equipment: None  Intra-op Plan:   Post-operative Plan:   Informed Consent: I have reviewed the patients History and Physical, chart, labs and discussed the procedure including the risks, benefits and alternatives for the proposed anesthesia with the patient or authorized representative who has indicated his/her understanding and acceptance.     Dental advisory given  Plan Discussed with: CRNA and Anesthesiologist  Anesthesia Plan Comments:         Anesthesia Quick Evaluation

## 2022-10-30 NOTE — Transfer of Care (Signed)
Immediate Anesthesia Transfer of Care Note  Patient: Matthew Sullivan  Procedure(s) Performed: CARDIOVERSION  Patient Location: Cath Lab  Anesthesia Type:General  Level of Consciousness: awake, alert , and oriented  Airway & Oxygen Therapy: Patient Spontanous Breathing  Post-op Assessment: Report given to RN and Post -op Vital signs reviewed and stable  Post vital signs: Reviewed and stable  Last Vitals:  Vitals Value Taken Time  BP 133/82   Temp    Pulse 66   Resp 16   SpO2 99     Last Pain:  Vitals:   10/30/22 0916  TempSrc: Temporal  PainSc: 0-No pain         Complications: No notable events documented.

## 2022-10-30 NOTE — CV Procedure (Signed)
Procedure: Electrical Cardioversion Indications:  Atrial Fibrillation  Procedure Details:  Consent: Risks of procedure as well as the alternatives and risks of each were explained to the (patient/caregiver).  Consent for procedure obtained.  Time Out: Verified patient identification, verified procedure, site/side was marked, verified correct patient position, special equipment/implants available, medications/allergies/relevent history reviewed, required imaging and test results available. PERFORMED.  Patient placed on cardiac monitor, pulse oximetry, supplemental oxygen as necessary.  Sedation given:  propofol, see anesthesia notes Pacer pads placed anterior and posterior chest.  Cardioverted 1 time(s).  Cardioversion with synchronized biphasic 200J shock.  Evaluation: Findings: Post procedure EKG shows: NSR Complications: None Patient did tolerate procedure well.  Time Spent Directly with the Patient:  15 minutes   [name] 10/30/2022, 10:34 AM

## 2022-10-30 NOTE — Anesthesia Postprocedure Evaluation (Signed)
Anesthesia Post Note  Patient: Matthew Sullivan  Procedure(s) Performed: CARDIOVERSION     Patient location during evaluation: PACU Anesthesia Type: General Level of consciousness: awake and alert Pain management: pain level controlled Vital Signs Assessment: post-procedure vital signs reviewed and stable Respiratory status: spontaneous breathing, nonlabored ventilation, respiratory function stable and patient connected to nasal cannula oxygen Cardiovascular status: blood pressure returned to baseline and stable Postop Assessment: no apparent nausea or vomiting Anesthetic complications: no   No notable events documented.  Last Vitals:  Vitals:   10/30/22 1042 10/30/22 1052  BP: 116/79 115/76  Pulse: 67 66  Resp: 15 12  Temp:    SpO2: 97% 98%    Last Pain:  Vitals:   10/30/22 1052  TempSrc:   PainSc: 0-No pain                 Tyheem Boughner

## 2022-10-30 NOTE — Discharge Instructions (Signed)

## 2022-11-02 DIAGNOSIS — L814 Other melanin hyperpigmentation: Secondary | ICD-10-CM | POA: Diagnosis not present

## 2022-11-02 DIAGNOSIS — D225 Melanocytic nevi of trunk: Secondary | ICD-10-CM | POA: Diagnosis not present

## 2022-11-02 DIAGNOSIS — D492 Neoplasm of unspecified behavior of bone, soft tissue, and skin: Secondary | ICD-10-CM | POA: Diagnosis not present

## 2022-11-02 DIAGNOSIS — L821 Other seborrheic keratosis: Secondary | ICD-10-CM | POA: Diagnosis not present

## 2022-11-02 DIAGNOSIS — Z85820 Personal history of malignant melanoma of skin: Secondary | ICD-10-CM | POA: Diagnosis not present

## 2022-11-02 DIAGNOSIS — L570 Actinic keratosis: Secondary | ICD-10-CM | POA: Diagnosis not present

## 2022-11-02 DIAGNOSIS — Z08 Encounter for follow-up examination after completed treatment for malignant neoplasm: Secondary | ICD-10-CM | POA: Diagnosis not present

## 2022-11-02 DIAGNOSIS — B079 Viral wart, unspecified: Secondary | ICD-10-CM | POA: Diagnosis not present

## 2022-11-02 DIAGNOSIS — Z85828 Personal history of other malignant neoplasm of skin: Secondary | ICD-10-CM | POA: Diagnosis not present

## 2022-11-09 ENCOUNTER — Ambulatory Visit (HOSPITAL_COMMUNITY)
Admission: RE | Admit: 2022-11-09 | Discharge: 2022-11-09 | Disposition: A | Discharge status: Home / Self Care | Payer: Medicare HMO | Source: Ambulatory Visit | Attending: Physician Assistant | Admitting: Physician Assistant

## 2022-11-09 VITALS — BP 168/80 | HR 55 | Ht 72.0 in | Wt 220.0 lb

## 2022-11-09 DIAGNOSIS — Z87891 Personal history of nicotine dependence: Secondary | ICD-10-CM | POA: Diagnosis not present

## 2022-11-09 DIAGNOSIS — D6869 Other thrombophilia: Secondary | ICD-10-CM | POA: Diagnosis not present

## 2022-11-09 DIAGNOSIS — I4819 Other persistent atrial fibrillation: Secondary | ICD-10-CM

## 2022-11-09 DIAGNOSIS — I2510 Atherosclerotic heart disease of native coronary artery without angina pectoris: Secondary | ICD-10-CM | POA: Diagnosis not present

## 2022-11-09 NOTE — Progress Notes (Signed)
Primary Care Physician: [name], MD Primary Cardiologist: Dr Duke Salvia  Primary Electrophysiologist: Dr Elberta Fortis Referring Physician: Dr Iran Sizer Matthew Sullivan is a 77 y.o. male with a history of HTN, HLD, atrial myxoma s/p excision 2013, TIA, atrial fibrillation who presents for follow up in the [facility].  He had postoperative atrial fibrillation and had a cardioversion in 2014.  He was started on amiodarone which was subsequently discontinued. Patient is on Xarelto for a CHADS2VASC score of 5. Patient underwent DCCV on 03/20/22 but returned to afib before leaving the hospital. Patient is s/p DCCV on 04/12/22 which lasted at most 48 hours. Seen by Dr Elberta Fortis and underwent afib ablation on 09/19/22.  Patient had R groin site oozing after leaving the hospital. He had been changing his dressing multiple times and his son who is an EMT held pressure twice for 20 minutes without improvement. He came into the office 09/22/22 and was injected with lidocaine with epi which resolved the oozing. He was found to be in persistent afib again on follow up 10/17/22 and underwent DCCV on 10/30/22.  On follow up today, patient reports that he has done well since his latest DCCV. His fatigue has resolved. No bleeding issues on anticoagulation.   Today, he denies symptoms of palpitations, chest pain, shortness of breath, orthopnea, PND, lower extremity edema, dizziness, presyncope, syncope, snoring, daytime somnolence, bleeding, or neurologic sequela. The patient is tolerating medications without difficulties and is otherwise without complaint today.    Atrial Fibrillation Risk Factors:  he does not have symptoms or diagnosis of sleep apnea. he does not have a history of rheumatic fever.   he has a BMI of Body mass index is 29.84 kg/m.Marland Kitchen Filed Weights   11/09/22 0923  Weight: 99.8 kg   Family History  Problem Relation Age of Onset   Colon cancer Mother         patient reported that his mother didn't have colon cancer.   Liver cancer Mother    Cancer Father    Brain cancer Father        glioblastoma   Prostate cancer Neg Hx     Atrial Fibrillation Management history:  Previous antiarrhythmic drugs: amiodarone, flecainide  Previous cardioversions: 2014, 06/23/21, 03/20/22, 04/12/22, 10/30/22 Previous ablations: 09/19/22 Anticoagulation history: Xarelto    Past Medical History:  Diagnosis Date   Ascending aortic aneurysm (HCC) 09/02/2021   Atrial fibrillation, currently in sinus rhythm    Atrial myxoma    Cervical osteoarthritis    History of TIA (transient ischemic attack)    History of tinnitus    Hyperlipidemia    Hypertension    Melanoma (HCC)    per Dr. Terri Piedra, local excision, no chemo/no rady tx.    Past Surgical History:  Procedure Laterality Date   ANKLE ARTHROSCOPY WITH ARTHRODESIS Left 08/26/2015   Procedure: LEFT ANKLE ARTHROSCOPY TIBIOTALAR ARTHRODESIS;  Surgeon: Loreta Ave, MD;  Location: [facility];  Service: Orthopedics;  Laterality: Left;   APPENDECTOMY     ATRIAL FIBRILLATION ABLATION N/A 09/19/2022   Procedure: ATRIAL FIBRILLATION ABLATION;  Surgeon: Regan Lemming, MD;  Location: [facility] INVASIVE CV LAB;  Service: Cardiovascular;  Laterality: N/A;   BACK SURGERY     CARDIOVERSION N/A 06/25/2013   Procedure: CARDIOVERSION;  Surgeon: Cassell Clement, MD;  Location: [facility] ENDOSCOPY;  Service: Cardiovascular;  Laterality: N/A;   CARDIOVERSION N/A 06/23/2021   Procedure: CARDIOVERSION;  Surgeon: Vesta Mixer, MD;  Location: [facility] ENDOSCOPY;  Service: Cardiovascular;  Laterality: N/A;   CARDIOVERSION N/A 03/20/2022   Procedure: CARDIOVERSION;  Surgeon: Chilton Si, MD;  Location: [facility] ENDOSCOPY;  Service: Cardiovascular;  Laterality: N/A;   CARDIOVERSION N/A 04/12/2022   Procedure: CARDIOVERSION;  Surgeon: Parke Poisson, MD;  Location: [facility] ENDOSCOPY;  Service: Cardiovascular;  Laterality: N/A;    CARDIOVERSION N/A 10/30/2022   Procedure: CARDIOVERSION;  Surgeon: [name], MD;  Location: [facility]VAS[facility] CV LAB;  Service: Cardiovascular;  Laterality: N/A;   EXCISION OF ATRIAL MYXOMA Left 04/09/2013   Procedure: EXCISION OF ATRIAL MYXOMA;  Surgeon: Loreli Slot, MD;  Location: [facility] OR;  Service: Open Heart Surgery;  Laterality: Left;   INTRAOPERATIVE TRANSESOPHAGEAL ECHOCARDIOGRAM N/A 04/09/2013   Procedure: INTRAOPERATIVE TRANSESOPHAGEAL ECHOCARDIOGRAM;  Surgeon: Loreli Slot, MD;  Location: [facility] OR;  Service: Open Heart Surgery;  Laterality: N/A;   LEFT HEART CATHETERIZATION WITH CORONARY ANGIOGRAM N/A 04/07/2013   Procedure: LEFT HEART CATHETERIZATION WITH CORONARY ANGIOGRAM;  Surgeon: Micheline Chapman, MD;  Location: [facility] CATH LAB;  Service: Cardiovascular;  Laterality: N/A;   NASAL SEPTUM SURGERY     NOSE SURGERY     ROTATOR CUFF REPAIR     both shoulders    Current Outpatient Medications  Medication Sig Dispense Refill   acetaminophen (TYLENOL) 500 MG tablet Take 1,000 mg by mouth every 6 (six) hours as needed for moderate pain.     atorvastatin (LIPITOR) 80 MG tablet TAKE 1/2 TABLET (40 MG TOTAL) BY MOUTH DAILY. 45 tablet 3   Fluticasone Propionate (ALLERGY RELIEF NA) Place 1 spray into the nose at bedtime as needed (allergies).     hydrochlorothiazide (HYDRODIURIL) 25 MG tablet TAKE 1 TABLET (25 MG TOTAL) BY MOUTH DAILY. 90 tablet 3   levothyroxine (SYNTHROID) 25 MCG tablet Take 1 tablet (25 mcg total) by mouth daily. 90 tablet 1   Menthol-Methyl Salicylate (MUSCLE RUB) 10-15 % CREA Apply 1 Application topically as needed for muscle pain.     metoprolol tartrate (LOPRESSOR) 25 MG tablet TAKE 1 TABLET BY MOUTH 2 TIMES DAILY. (Patient taking differently: Take 12.5 mg by mouth 2 (two) times daily.) 180 tablet 3   Multiple Vitamin (MULTIVITAMIN WITH MINERALS) TABS tablet Take 1 tablet by mouth daily.     potassium chloride SA (KLOR-CON M) 20 MEQ tablet Take 1 (20 mg) tablet  by mouth in the morning and 2 (40 mg) tablets at bedtime 270 tablet 3   Saw Palmetto 450 MG CAPS Take 450 mg by mouth 2 (two) times daily.     XARELTO 20 MG TABS tablet TAKE 1 TABLET BY MOUTH DAILY WITH SUPPER. 30 tablet 5   No current facility-administered medications for this encounter.    No Known Allergies  Social History   Socioeconomic History   Marital status: Married    Spouse name: Not on file   Number of children: Not on file   Years of education: Not on file   Highest education level: Not on file  Occupational History   Not on file  Tobacco Use   Smoking status: Former    Types: Cigarettes    Quit date: 07/10/1977    Years since quitting: 45.3   Smokeless tobacco: Never   Tobacco comments:    Former smoker 04/20/22  Substance and Sexual Activity   Alcohol use: Yes    Comment: rare   Drug use: No   Sexual activity: Not on file  Other Topics Concern   Not on file  Social History Narrative   Married Sales promotion account executive   Social Determinants of Health   Financial Resource Strain: Low Risk  (11/09/2021)   Overall Financial Resource Strain (CARDIA)    Difficulty of Paying Living Expenses: Not hard at all  Food Insecurity: No Food Insecurity (11/09/2021)   Hunger Vital Sign    Worried About Running Out of Food in the Last Year: Never true    Ran Out of Food in the Last Year: Never true  Transportation Needs: No Transportation Needs (11/09/2021)   PRAPARE - Administrator, Civil Service (Medical): No    Lack of Transportation (Non-Medical): No  Physical Activity: Sufficiently Active (11/09/2021)   Exercise Vital Sign    Days of Exercise per Week: 5 days    Minutes of Exercise per Session: 30 min  Stress: No Stress Concern Present (11/09/2021)   [employer] of Occupational Health - Occupational Stress Questionnaire    Feeling of Stress : Not at all  Social Connections: Socially Integrated (11/09/2021)   Social Connection and Isolation Panel [NHANES]     Frequency of Communication with Friends and Family: More than three times a week    Frequency of Social Gatherings with Friends and Family: More than three times a week    Attends Religious Services: More than 4 times per year    Active Member of [employer] or Organizations: Yes    Attends Engineer, structural: More than 4 times per year    Marital Status: Married  Catering manager Violence: Not At Risk (11/09/2021)   Humiliation, Afraid, Rape, and Kick questionnaire    Fear of Current or Ex-Partner: No    Emotionally Abused: No    Physically Abused: No    Sexually Abused: No     ROS- All systems are reviewed and negative except as per the HPI above.  Physical Exam: Vitals:   11/09/22 0923  BP: (!) 168/80  Pulse: (!) 55  Weight: 99.8 kg  Height: 6' (1.829 m)     GEN- The patient is a well appearing elderly male, alert and oriented x 3 today.   HEENT-head normocephalic, atraumatic, sclera clear, conjunctiva pink, hearing intact, trachea midline. Lungs- Clear to ausculation bilaterally, normal work of breathing Heart- Regular rate and rhythm, no murmurs, rubs or gallops  GI- soft, NT, ND, + BS Extremities- no clubbing, cyanosis, or edema MS- no significant deformity or atrophy Skin- no rash or lesion Psych- euthymic mood, full affect Neuro- strength and sensation are intact   Wt Readings from Last 3 Encounters:  11/09/22 99.8 kg  10/30/22 102.1 kg  10/17/22 101 kg    EKG today demonstrates SB, PACs Vent. rate 55 BPM PR interval 180 ms QRS duration 88 ms QT/QTcB 480/459 ms   Echo 06/27/21 demonstrated   1. Left ventricular ejection fraction, by estimation, is 50 to 55%. The  left ventricle has low normal function. The left ventricle demonstrates  global hypokinesis. The left ventricular internal cavity size was mildly  dilated. Left ventricular diastolic parameters are indeterminate.   2. Right ventricular systolic function is normal. The right ventricular   size is normal.   3. Left atrial size was severely dilated.   4. Right atrial size was severely dilated.   5. The mitral valve is grossly normal. Mild mitral valve regurgitation.  No evidence of mitral stenosis.   6. The aortic valve is normal in structure. Aortic valve regurgitation is  not visualized. No aortic stenosis is present.  7. Aortic dilatation noted. There is borderline dilatation of the  ascending aorta, measuring 38 mm.   Comparison(s): EF 50%.   Epic records are reviewed at length today  CHA2DS2-VASc Score = 6  The patient's score is based upon: CHF History: 0 HTN History: 1 Diabetes History: 0 Stroke History: 2 (TIA) Vascular Disease History: 1 Age Score: 2 Gender Score: 0       ASSESSMENT AND PLAN: 1. Persistent Atrial Fibrillation (ICD10:  I48.19) The patient's CHA2DS2-VASc score is 6, indicating a 9.7% annual risk of stroke.   Patient is s/p afib ablation 09/19/22 S/p DCCV 10/30/22 Patient appears to be maintaining SR.  Could consider dofetilide or amiodarone if AAD is needed.  Continue Lopressor 25 mg BID Continue Xarelto 20 mg daily with no missed doses for 3 months post ablation.   2. Secondary Hypercoagulable State (ICD10:  D68.69) The patient is at significant risk for stroke/thromboembolism based upon his CHA2DS2-VASc Score of 6.  Continue Rivaroxaban (Xarelto).   3. CAD CAC score 636 On statin No anginal symptoms.   Follow up with Dr Elberta Fortis as scheduled.    Jorja Loa PA-C Af[facility] [address] [place], [place] [address] [phone] 11/09/2022 9:52 AM

## 2022-11-13 ENCOUNTER — Ambulatory Visit (INDEPENDENT_AMBULATORY_CARE_PROVIDER_SITE_OTHER): Payer: Medicare HMO

## 2022-11-13 VITALS — Ht 72.0 in | Wt 220.0 lb

## 2022-11-13 DIAGNOSIS — Z0000 Encounter for general adult medical examination without abnormal findings: Secondary | ICD-10-CM | POA: Diagnosis not present

## 2022-11-13 NOTE — Patient Instructions (Signed)
Matthew Sullivan , Thank you for taking time to come for your Medicare Wellness Visit. I appreciate your ongoing commitment to your health goals. Please review the following plan we discussed and let me know if I can assist you in the future.   These are the goals we discussed:  Goals      Exercise 3x per week (30 min per time)     Continue to stay healthy and exercise.      Patient Stated     Stay active.        This is a list of the screening recommended for you and due dates:  Health Maintenance  Topic Date Due   COVID-19 Vaccine (1) Never done   Hepatitis C Screening: USPSTF Recommendation to screen - Ages 54-79 yo.  Never done   DTaP/Tdap/Td vaccine (1 - Tdap) Never done   Zoster (Shingles) Vaccine (1 of 2) Never done   Flu Shot  02/08/2023   Medicare Annual Wellness Visit  11/13/2023   Pneumonia Vaccine  Completed   HPV Vaccine  Aged Out    Advanced directives: Advance directive discussed with you today. Even though you declined this today, please call our office should you change your mind, and we can give you the proper paperwork for you to fill out.   Conditions/risks identified: none  Next appointment: Follow up in one year for your annual wellness visit. 11/14/23 @ 10:30 telephone  Preventive Care 65 Years and Older, Male  Preventive care refers to lifestyle choices and visits with your health care provider that can promote health and wellness. What does preventive care include? A yearly physical exam. This is also called an annual well check. Dental exams once or twice a year. Routine eye exams. Ask your health care provider how often you should have your eyes checked. Personal lifestyle choices, including: Daily care of your teeth and gums. Regular physical activity. Eating a healthy diet. Avoiding tobacco and drug use. Limiting alcohol use. Practicing safe sex. Taking low doses of aspirin every day. Taking vitamin and mineral supplements as recommended by your  health care provider. What happens during an annual well check? The services and screenings done by your health care provider during your annual well check will depend on your age, overall health, lifestyle risk factors, and family history of disease. Counseling  Your health care provider may ask you questions about your: Alcohol use. Tobacco use. Drug use. Emotional well-being. Home and relationship well-being. Sexual activity. Eating habits. History of falls. Memory and ability to understand (cognition). Work and work Astronomer. Screening  You may have the following tests or measurements: Height, weight, and BMI. Blood pressure. Lipid and cholesterol levels. These may be checked every 5 years, or more frequently if you are over 73 years old. Skin check. Lung cancer screening. You may have this screening every year starting at age 84 if you have a 30-pack-year history of smoking and currently smoke or have quit within the past 15 years. Fecal occult blood test (FOBT) of the stool. You may have this test every year starting at age 71. Flexible sigmoidoscopy or colonoscopy. You may have a sigmoidoscopy every 5 years or a colonoscopy every 10 years starting at age 10. Prostate cancer screening. Recommendations will vary depending on your family history and other risks. Hepatitis C blood test. Hepatitis B blood test. Sexually transmitted disease (STD) testing. Diabetes screening. This is done by checking your blood sugar (glucose) after you have not eaten for a while (  fasting). You may have this done every 1-3 years. Abdominal aortic aneurysm (AAA) screening. You may need this if you are a current or former smoker. Osteoporosis. You may be screened starting at age 64 if you are at high risk. Talk with your health care provider about your test results, treatment options, and if necessary, the need for more tests. Vaccines  Your health care provider may recommend certain vaccines, such  as: Influenza vaccine. This is recommended every year. Tetanus, diphtheria, and acellular pertussis (Tdap, Td) vaccine. You may need a Td booster every 10 years. Zoster vaccine. You may need this after age 73. Pneumococcal 13-valent conjugate (PCV13) vaccine. One dose is recommended after age 13. Pneumococcal polysaccharide (PPSV23) vaccine. One dose is recommended after age 56. Talk to your health care provider about which screenings and vaccines you need and how often you need them. This information is not intended to replace advice given to you by your health care provider. Make sure you discuss any questions you have with your health care provider. Document Released: 07/23/2015 Document Revised: 03/15/2016 Document Reviewed: 04/27/2015 Elsevier Interactive Patient Education  2017 [place] Prevention in the Home Falls can cause injuries. They can happen to people of all ages. There are many things you can do to make your home safe and to help prevent falls. What can I do on the outside of my home? Regularly fix the edges of walkways and driveways and fix any cracks. Remove anything that might make you trip as you walk through a door, such as a raised step or threshold. Trim any bushes or trees on the path to your home. Use bright outdoor lighting. Clear any walking paths of anything that might make someone trip, such as rocks or tools. Regularly check to see if handrails are loose or broken. Make sure that both sides of any steps have handrails. Any raised decks and porches should have guardrails on the edges. Have any leaves, snow, or ice cleared regularly. Use sand or salt on walking paths during winter. Clean up any spills in your garage right away. This includes oil or grease spills. What can I do in the bathroom? Use night lights. Install grab bars by the toilet and in the tub and shower. Do not use towel bars as grab bars. Use non-skid mats or decals in the tub or  shower. If you need to sit down in the shower, use a plastic, non-slip stool. Keep the floor dry. Clean up any water that spills on the floor as soon as it happens. Remove soap buildup in the tub or shower regularly. Attach bath mats securely with double-sided non-slip rug tape. Do not have throw rugs and other things on the floor that can make you trip. What can I do in the bedroom? Use night lights. Make sure that you have a light by your bed that is easy to reach. Do not use any sheets or blankets that are too big for your bed. They should not hang down onto the floor. Have a firm chair that has side arms. You can use this for support while you get dressed. Do not have throw rugs and other things on the floor that can make you trip. What can I do in the kitchen? Clean up any spills right away. Avoid walking on wet floors. Keep items that you use a lot in easy-to-reach places. If you need to reach something above you, use a strong step stool that has a grab bar.  Keep electrical cords out of the way. Do not use floor polish or wax that makes floors slippery. If you must use wax, use non-skid floor wax. Do not have throw rugs and other things on the floor that can make you trip. What can I do with my stairs? Do not leave any items on the stairs. Make sure that there are handrails on both sides of the stairs and use them. Fix handrails that are broken or loose. Make sure that handrails are as long as the stairways. Check any carpeting to make sure that it is firmly attached to the stairs. Fix any carpet that is loose or worn. Avoid having throw rugs at the top or bottom of the stairs. If you do have throw rugs, attach them to the floor with carpet tape. Make sure that you have a light switch at the top of the stairs and the bottom of the stairs. If you do not have them, ask someone to add them for you. What else can I do to help prevent falls? Wear shoes that: Do not have high heels. Have  rubber bottoms. Are comfortable and fit you well. Are closed at the toe. Do not wear sandals. If you use a stepladder: Make sure that it is fully opened. Do not climb a closed stepladder. Make sure that both sides of the stepladder are locked into place. Ask someone to hold it for you, if possible. Clearly mark and make sure that you can see: Any grab bars or handrails. First and last steps. Where the edge of each step is. Use tools that help you move around (mobility aids) if they are needed. These include: Canes. Walkers. Scooters. Crutches. Turn on the lights when you go into a dark area. Replace any light bulbs as soon as they burn out. Set up your furniture so you have a clear path. Avoid moving your furniture around. If any of your floors are uneven, fix them. If there are any pets around you, be aware of where they are. Review your medicines with your doctor. Some medicines can make you feel dizzy. This can increase your chance of falling. Ask your doctor what other things that you can do to help prevent falls. This information is not intended to replace advice given to you by your health care provider. Make sure you discuss any questions you have with your health care provider. Document Released: 04/22/2009 Document Revised: 12/02/2015 Document Reviewed: 07/31/2014 Elsevier Interactive Patient Education  2017 [employer].

## 2022-11-13 NOTE — Progress Notes (Signed)
I connected with  Matthew Sullivan on 11/13/22 by a audio enabled telemedicine application and verified that I am speaking with the correct person using two identifiers.  Patient Location: Home  Provider Location: Home Office  I discussed the limitations of evaluation and management by telemedicine. The patient expressed understanding and agreed to proceed.  Subjective:   Matthew Sullivan is a 77 y.o. male who presents for Medicare Annual/Subsequent preventive examination.  Review of Systems      Cardiac Risk Factors include: advanced age (>21men, >65 women);male gender;hypertension     Objective:    Today's Vitals   11/13/22 0928  Weight: 220 lb (99.8 kg)  Height: 6' (1.829 m)   Body mass index is 29.84 kg/m.     11/13/2022    9:38 AM 09/19/2022   11:49 AM 04/12/2022    9:40 AM 03/20/2022   12:16 PM 11/09/2021   10:00 AM 06/23/2021    7:23 AM 07/14/2019    4:00 AM  Advanced Directives  Does Patient Have a Medical Advance Directive? No No No No No No No  Would patient like information on creating a medical advance directive? No - Patient declined No - Patient declined No - Patient declined No - Patient declined No - Patient declined No - Patient declined No - Patient declined    Current Medications (verified) Outpatient Encounter Medications as of 11/13/2022  Medication Sig   acetaminophen (TYLENOL) 500 MG tablet Take 1,000 mg by mouth every 6 (six) hours as needed for moderate pain.   atorvastatin (LIPITOR) 80 MG tablet TAKE 1/2 TABLET (40 MG TOTAL) BY MOUTH DAILY.   Fluticasone Propionate (ALLERGY RELIEF NA) Place 1 spray into the nose at bedtime as needed (allergies).   hydrochlorothiazide (HYDRODIURIL) 25 MG tablet TAKE 1 TABLET (25 MG TOTAL) BY MOUTH DAILY.   levothyroxine (SYNTHROID) 25 MCG tablet Take 1 tablet (25 mcg total) by mouth daily.   Menthol-Methyl Salicylate (MUSCLE RUB) 10-15 % CREA Apply 1 Application topically as needed for muscle pain.   metoprolol tartrate  (LOPRESSOR) 25 MG tablet TAKE 1 TABLET BY MOUTH 2 TIMES DAILY. (Patient taking differently: Take 12.5 mg by mouth 2 (two) times daily.)   Multiple Vitamin (MULTIVITAMIN WITH MINERALS) TABS tablet Take 1 tablet by mouth daily.   potassium chloride SA (KLOR-CON M) 20 MEQ tablet Take 1 (20 mg) tablet by mouth in the morning and 2 (40 mg) tablets at bedtime   Saw Palmetto 450 MG CAPS Take 450 mg by mouth 2 (two) times daily.   XARELTO 20 MG TABS tablet TAKE 1 TABLET BY MOUTH DAILY WITH SUPPER.   No facility-administered encounter medications on file as of 11/13/2022.    Allergies (verified) Patient has no known allergies.   History: Past Medical History:  Diagnosis Date   Ascending aortic aneurysm (HCC) 09/02/2021   Atrial fibrillation, currently in sinus rhythm    Atrial myxoma    Cervical osteoarthritis    History of TIA (transient ischemic attack)    History of tinnitus    Hyperlipidemia    Hypertension    Melanoma (HCC)    per Dr. Terri Piedra, local excision, no chemo/no rady tx.    Past Surgical History:  Procedure Laterality Date   ANKLE ARTHROSCOPY WITH ARTHRODESIS Left 08/26/2015   Procedure: LEFT ANKLE ARTHROSCOPY TIBIOTALAR ARTHRODESIS;  Surgeon: Loreta Ave, MD;  Location: [facility];  Service: Orthopedics;  Laterality: Left;   APPENDECTOMY     ATRIAL FIBRILLATION ABLATION N/A 09/19/2022  Procedure: ATRIAL FIBRILLATION ABLATION;  Surgeon: Regan Lemming, MD;  Location: [facility] INVASIVE CV LAB;  Service: Cardiovascular;  Laterality: N/A;   BACK SURGERY     CARDIOVERSION N/A 06/25/2013   Procedure: CARDIOVERSION;  Surgeon: Cassell Clement, MD;  Location: [facility] ENDOSCOPY;  Service: Cardiovascular;  Laterality: N/A;   CARDIOVERSION N/A 06/23/2021   Procedure: CARDIOVERSION;  Surgeon: Vesta Mixer, MD;  Location[facility] ENDOSCOPY;  Service: Cardiovascular;  Laterality: N/A;   CARDIOVERSION N/A 03/20/2022   Procedure: CARDIOVERSION;  Surgeon: Chilton Si, MD;   Location: [facility] ENDOSCOPY;  Service: Cardiovascular;  Laterality: N/A;   CARDIOVERSION N/A 04/12/2022   Procedure: CARDIOVERSION;  Surgeon: Parke Poisson, MD;  Location: [facility] ENDOSCOPY;  Service: Cardiovascular;  Laterality: N/A;   CARDIOVERSION N/A 10/30/2022   Procedure: CARDIOVERSION;  Surgeon: [name], MD;  Location: [facility] INVASIVE CV LAB;  Service: Cardiovascular;  Laterality: N/A;   CARDIOVERSION  10/30/2022   EXCISION OF ATRIAL MYXOMA Left 04/09/2013   Procedure: EXCISION OF ATRIAL MYXOMA;  Surgeon: Loreli Slot, MD;  Location: [facility] OR;  Service: Open Heart Surgery;  Laterality: Left;   INTRAOPERATIVE TRANSESOPHAGEAL ECHOCARDIOGRAM N/A 04/09/2013   Procedure: INTRAOPERATIVE TRANSESOPHAGEAL ECHOCARDIOGRAM;  Surgeon: Loreli Slot, MD;  Location: [facility] OR;  Service: Open Heart Surgery;  Laterality: N/A;   LEFT HEART CATHETERIZATION WITH CORONARY ANGIOGRAM N/A 04/07/2013   Procedure: LEFT HEART CATHETERIZATION WITH CORONARY ANGIOGRAM;  Surgeon: Micheline Chapman, MD;  Location: [facility] CATH LAB;  Service: Cardiovascular;  Laterality: N/A;   NASAL SEPTUM SURGERY     NOSE SURGERY     ROTATOR CUFF REPAIR     both shoulders   Family History  Problem Relation Age of Onset   Colon cancer Mother        patient reported that his mother didn't have colon cancer.   Liver cancer Mother    Cancer Father    Brain cancer Father        glioblastoma   Prostate cancer Neg Hx    Social History   Socioeconomic History   Marital status: Married    Spouse name: Not on file   Number of children: Not on file   Years of education: Not on file   Highest education level: Not on file  Occupational History   Not on file  Tobacco Use   Smoking status: Former    Types: Cigarettes    Quit date: 07/10/1977    Years since quitting: 45.3   Smokeless tobacco: Never   Tobacco comments:    Former smoker 04/20/22  Substance and Sexual Activity   Alcohol use: Yes    Comment: rare   Drug use: No    Sexual activity: Not on file  Other Topics Concern   Not on file  Social History Narrative   Married Sales promotion account executive   Social Determinants of Health   Financial Resource Strain: Low Risk  (11/13/2022)   Overall Financial Resource Strain (CARDIA)    Difficulty of Paying Living Expenses: Not hard at all  Food Insecurity: No Food Insecurity (11/13/2022)   Hunger Vital Sign    Worried About Running Out of Food in the Last Year: Never true    Ran Out of Food in the Last Year: Never true  Transportation Needs: No Transportation Needs (11/13/2022)   PRAPARE - Administrator, Civil Service (Medical): No    Lack of Transportation (Non-Medical): No  Physical Activity: Sufficiently Active (11/13/2022)   Exercise Vital Sign  Days of Exercise per Week: 7 days    Minutes of Exercise per Session: 30 min  Stress: No Stress Concern Present (11/13/2022)   [employer] of Occupational Health - Occupational Stress Questionnaire    Feeling of Stress : Not at all  Social Connections: Moderately Integrated (11/13/2022)   Social Connection and Isolation Panel [NHANES]    Frequency of Communication with Friends and Family: More than three times a week    Frequency of Social Gatherings with Friends and Family: More than three times a week    Attends Religious Services: More than 4 times per year    Active Member of [employer] or Organizations: No    Attends Banker Meetings: Never    Marital Status: Married    Tobacco Counseling Counseling given: Not Answered Tobacco comments: Former smoker 04/20/22   Clinical Intake:  Pre-visit preparation completed: Yes  Pain : No/denies pain     Nutritional Risks: None Diabetes: No  How often do you need to have someone help you when you read instructions, pamphlets, or other written materials from your doctor or pharmacy?: 1 - Never  Diabetic no  Interpreter Needed?: No  Information entered by :: C.Rosella Crandell LPN   Activities of  Daily Living    11/13/2022    9:38 AM 10/30/2022    9:16 AM  In your present state of health, do you have any difficulty performing the following activities:  Hearing? 0 0  Vision? 0 0  Difficulty concentrating or making decisions? 0 0  Walking or climbing stairs? 0 0  Dressing or bathing? 0 0  Doing errands, shopping? 0   Preparing Food and eating ? N   Using the Toilet? N   In the past six months, have you accidently leaked urine? N   Do you have problems with loss of bowel control? N   Managing your Medications? N   Managing your Finances? N   Housekeeping or managing your Housekeeping? N     Patient Care Team: [name], MD as PCP - General (Family Medicine) Nahser, Deloris Ping, MD as PCP - Cardiology (Cardiology)  Indicate any recent Medical Services you may have received from other than [facility] providers in the past year (date may be approximate).     Assessment:   This is a routine wellness examination for Matthew Sullivan.  Hearing/Vision screen Hearing Screening - Comments:: No aids Vision Screening - Comments:: Glasses - Dr.Lyn ([facility])  Dietary issues and exercise activities discussed: Current Exercise Habits: Home exercise routine, Type of exercise: walking, Time (Minutes): 30, Frequency (Times/Week): 7, Weekly Exercise (Minutes/Week): 210, Intensity: Mild, Exercise limited by: None identified   Goals Addressed             This Visit's Progress    Patient Stated       Stay active.       Depression Screen    11/13/2022    9:37 AM 11/09/2021    9:58 AM 11/05/2020    8:08 AM 07/20/2017   10:13 AM 11/13/2016   12:06 PM  PHQ 2/9 Scores  PHQ - 2 Score 0 0 0 0 0    Fall Risk    11/13/2022    9:32 AM 11/09/2021   10:00 AM 11/05/2020    8:08 AM 06/04/2019    9:53 AM 07/20/2017   10:13 AM  Fall Risk   Falls in the past year? 0 0 0 0 No  Comment    Emmi Telephone Survey: data to  providers prior to load   Number falls in past yr: 0 0 0    Injury with Fall? 0 0 0     Risk for fall due to : No Fall Risks No Fall Risks     Follow up Falls prevention discussed;Falls evaluation completed;Education provided Falls prevention discussed Falls evaluation completed      FALL RISK PREVENTION PERTAINING TO THE HOME:  Any stairs in or around the home? Yes  If so, are there any without handrails? No  Home free of loose throw rugs in walkways, pet beds, electrical cords, etc? Yes  Adequate lighting in your home to reduce risk of falls? Yes   ASSISTIVE DEVICES UTILIZED TO PREVENT FALLS:  Life alert? No  Use of a cane, walker or w/c? No  Grab bars in the bathroom? No  Shower chair or bench in shower? Yes  Elevated toilet seat or a handicapped toilet? Yes    Cognitive Function:        11/13/2022    9:39 AM 11/09/2021   10:02 AM  6CIT Screen  What Year? 0 points 0 points  What month? 0 points 0 points  What time? 0 points 0 points  Count back from 20 0 points 0 points  Months in reverse 0 points 0 points  Repeat phrase 0 points 0 points  Total Score 0 points 0 points    Immunizations Immunization History  Administered Date(s) Administered   Fluad Quad(high Dose 65+) 06/10/2021   Influenza, High Dose Seasonal PF 07/16/2018   Influenza,inj,Quad PF,6+ Mos 07/19/2016, 07/20/2017   Influenza-Unspecified 07/20/2018   Pneumococcal Conjugate-13 07/20/2017   Pneumococcal Polysaccharide-23 11/05/2020    TDAP status: Due, Education has been provided regarding the importance of this vaccine. Advised may receive this vaccine at local pharmacy or Health Dept. Aware to provide a copy of the vaccination record if obtained from local pharmacy or Health Dept. Verbalized acceptance and understanding.  Flu Vaccine status: Up to date  Pneumococcal vaccine status: Up to date  Covid-19 vaccine status: Declined, Education has been provided regarding the importance of this vaccine but patient still declined. Advised may receive this vaccine at local pharmacy or Health  Dept.or vaccine clinic. Aware to provide a copy of the vaccination record if obtained from local pharmacy or Health Dept. Verbalized acceptance and understanding.  Qualifies for Shingles Vaccine? Yes   Zostavax completed No   Shingrix Completed?: No.    Education has been provided regarding the importance of this vaccine. Patient has been advised to call insurance company to determine out of pocket expense if they have not yet received this vaccine. Advised may also receive vaccine at local pharmacy or Health Dept. Verbalized acceptance and understanding.  Screening Tests Health Maintenance  Topic Date Due   COVID-19 Vaccine (1) Never done   Hepatitis C Screening  Never done   DTaP/Tdap/Td (1 - Tdap) Never done   Zoster Vaccines- Shingrix (1 of 2) Never done   INFLUENZA VACCINE  02/08/2023   Medicare Annual Wellness (AWV)  11/13/2023   Pneumonia Vaccine 62+ Years old  Completed   HPV VACCINES  Aged Out    Health Maintenance  Health Maintenance Due  Topic Date Due   COVID-19 Vaccine (1) Never done   Hepatitis C Screening  Never done   DTaP/Tdap/Td (1 - Tdap) Never done   Zoster Vaccines- Shingrix (1 of 2) Never done    Colorectal cancer screening: No longer required.   Lung Cancer Screening: (Low Dose CT  Chest recommended if Age 41-80 years, 30 pack-year currently smoking OR have quit w/in 15years.) does not qualify.   Lung Cancer Screening Referral: no  Additional Screening:  Hepatitis C Screening: does qualify; Completed DUE AT NEXT VISIT  Vision Screening: Recommended annual ophthalmology exams for early detection of glaucoma and other disorders of the eye. Is the patient up to date with their annual eye exam?  Yes  Who is the provider or what is the name of the office in which the patient attends annual eye exams? [facility] If pt is not established with a provider, would they like to be referred to a provider to establish care? No .   Dental Screening: Recommended annual  dental exams for proper oral hygiene  Community Resource Referral / Chronic Care Management: CRR required this visit?  No   CCM required this visit?  No      Plan:     I have personally reviewed and noted the following in the patient's chart:   Medical and social history Use of alcohol, tobacco or illicit drugs  Current medications and supplements including opioid prescriptions. Patient is not currently taking opioid prescriptions. Functional ability and status Nutritional status Physical activity Advanced directives List of other physicians Hospitalizations, surgeries, and ER visits in previous 12 months Vitals Screenings to include cognitive, depression, and falls Referrals and appointments  In addition, I have reviewed and discussed with patient certain preventive protocols, quality metrics, and best practice recommendations. A written personalized care plan for preventive services as well as general preventive health recommendations were provided to patient.     Maryan Puls, LPN   07/15/1094   Nurse Notes: none

## 2022-12-18 ENCOUNTER — Encounter: Payer: Self-pay | Admitting: Cardiology

## 2022-12-18 ENCOUNTER — Ambulatory Visit: Payer: Medicare HMO | Attending: Cardiology | Admitting: Cardiology

## 2022-12-18 VITALS — BP 116/80 | HR 64 | Ht 72.0 in | Wt 221.0 lb

## 2022-12-18 DIAGNOSIS — I10 Essential (primary) hypertension: Secondary | ICD-10-CM

## 2022-12-18 DIAGNOSIS — D6869 Other thrombophilia: Secondary | ICD-10-CM

## 2022-12-18 DIAGNOSIS — I4819 Other persistent atrial fibrillation: Secondary | ICD-10-CM | POA: Diagnosis not present

## 2022-12-18 NOTE — Patient Instructions (Signed)
Medication Instructions:  Your physician recommends that you continue on your current medications as directed. Please refer to the Current Medication list given to you today.  *If you need a refill on your cardiac medications before your next appointment, please call your pharmacy*   Lab Work: None ordered   Testing/Procedures: None ordered   Follow-Up: At [facility], you and your health needs are our priority.  As part of our continuing mission to provide you with exceptional heart care, we have created designated Provider Care Teams.  These Care Teams include your primary Cardiologist (physician) and Advanced Practice Providers (APPs -  Physician Assistants and Nurse Practitioners) who all work together to provide you with the care you need, when you need it.  Your next appointment:   6 month(s)  The format for your next appointment:   In Person  Provider:   You will follow up in the Atrial Fibrillation Clinic located at [facility]. Your provider will be: Clint R. Fenton, PA-C  Or  Landry Mellow, PA-C  Thank you for choosing [facility]!!   Dory Horn, RN [phone]

## 2022-12-18 NOTE — Progress Notes (Signed)
Electrophysiology Office Note   Date:  12/18/2022   ID:  Matthew Sullivan, Matthew Sullivan 1946/01/10, MRN [id number]  PCP:  Matthew Nam, MD  Cardiologist:  Nahser Primary Electrophysiologist:  Matthew Holtrop Jorja Loa, MD    Chief Complaint: AF   History of Present Illness: Matthew Sullivan is a 77 y.o. male who is being seen today for the evaluation of AF at the request of Matthew Nam, MD. Presenting today for electrophysiology evaluation.  History sniffer hypertension, hyperlipidemia, atrial myxoma, atrial fibrillation, TIA.  Myxoma was excised in 2013.  He continued to have episodes of atrial fibrillation.  He is now post ablation 09/19/2022.  He did require cardioversion post ablation.  He has not had any atrial fibrillation in the last 2 months.  Today, denies symptoms of palpitations, chest pain, shortness of breath, orthopnea, PND, lower extremity edema, claudication, dizziness, presyncope, syncope, bleeding, or neurologic sequela. The patient is tolerating medications without difficulties.      Past Medical History:  Diagnosis Date   Ascending aortic aneurysm (HCC) 09/02/2021   Atrial fibrillation, currently in sinus rhythm    Atrial myxoma    Cervical osteoarthritis    History of TIA (transient ischemic attack)    History of tinnitus    Hyperlipidemia    Hypertension    Melanoma (HCC)    per Dr. Terri Piedra, local excision, no chemo/no rady tx.    Past Surgical History:  Procedure Laterality Date   ANKLE ARTHROSCOPY WITH ARTHRODESIS Left 08/26/2015   Procedure: LEFT ANKLE ARTHROSCOPY TIBIOTALAR ARTHRODESIS;  Surgeon: Loreta Ave, MD;  Location: [facility];  Service: Orthopedics;  Laterality: Left;   APPENDECTOMY     ATRIAL FIBRILLATION ABLATION N/A 09/19/2022   Procedure: ATRIAL FIBRILLATION ABLATION;  Surgeon: Regan Lemming, MD;  Location: [facility] INVASIVE CV LAB;  Service: Cardiovascular;  Laterality: N/A;   BACK SURGERY     CARDIOVERSION N/A 06/25/2013    Procedure: CARDIOVERSION;  Surgeon: Cassell Clement, MD;  Location: [facility] ENDOSCOPY;  Service: Cardiovascular;  Laterality: N/A;   CARDIOVERSION N/A 06/23/2021   Procedure: CARDIOVERSION;  Surgeon: Vesta Mixer, MD;  Location: [facility] ENDOSCOPY;  Service: Cardiovascular;  Laterality: N/A;   CARDIOVERSION N/A 03/20/2022   Procedure: CARDIOVERSION;  Surgeon: Chilton Si, MD;  Location: [facility] ENDOSCOPY;  Service: Cardiovascular;  Laterality: N/A;   CARDIOVERSION N/A 04/12/2022   Procedure: CARDIOVERSION;  Surgeon: Parke Poisson, MD;  Location: [facility] ENDOSCOPY;  Service: Cardiovascular;  Laterality: N/A;   CARDIOVERSION N/A 10/30/2022   Procedure: CARDIOVERSION;  Surgeon: [name], MD;  Location: [facility] INVASIVE CV LAB;  Service: Cardiovascular;  Laterality: N/A;   CARDIOVERSION  10/30/2022   EXCISION OF ATRIAL MYXOMA Left 04/09/2013   Procedure: EXCISION OF ATRIAL MYXOMA;  Surgeon: Loreli Slot, MD;  Location: [facility] OR;  Service: Open Heart Surgery;  Laterality: Left;   INTRAOPERATIVE TRANSESOPHAGEAL ECHOCARDIOGRAM N/A 04/09/2013   Procedure: INTRAOPERATIVE TRANSESOPHAGEAL ECHOCARDIOGRAM;  Surgeon: Loreli Slot, MD;  Location: [facility] OR;  Service: Open Heart Surgery;  Laterality: N/A;   LEFT HEART CATHETERIZATION WITH CORONARY ANGIOGRAM N/A 04/07/2013   Procedure: LEFT HEART CATHETERIZATION WITH CORONARY ANGIOGRAM;  Surgeon: Micheline Chapman, MD;  Location: [facility] CATH LAB;  Service: Cardiovascular;  Laterality: N/A;   NASAL SEPTUM SURGERY     NOSE SURGERY     ROTATOR CUFF REPAIR     both shoulders     Current Outpatient Medications  Medication Sig Dispense Refill   acetaminophen (TYLENOL) 500 MG tablet  Take 1,000 mg by mouth every 6 (six) hours as needed for moderate pain.     atorvastatin (LIPITOR) 80 MG tablet TAKE 1/2 TABLET (40 MG TOTAL) BY MOUTH DAILY. 45 tablet 3   Fluticasone Propionate (ALLERGY RELIEF NA) Place 1 spray into the nose at bedtime as needed (allergies).      hydrochlorothiazide (HYDRODIURIL) 25 MG tablet TAKE 1 TABLET (25 MG TOTAL) BY MOUTH DAILY. 90 tablet 3   levothyroxine (SYNTHROID) 25 MCG tablet Take 1 tablet (25 mcg total) by mouth daily. 90 tablet 1   Menthol-Methyl Salicylate (MUSCLE RUB) 10-15 % CREA Apply 1 Application topically as needed for muscle pain.     metoprolol tartrate (LOPRESSOR) 25 MG tablet TAKE 1 TABLET BY MOUTH 2 TIMES DAILY. (Patient taking differently: Take 12.5 mg by mouth 2 (two) times daily.) 180 tablet 3   Multiple Vitamin (MULTIVITAMIN WITH MINERALS) TABS tablet Take 1 tablet by mouth daily.     potassium chloride SA (KLOR-CON M) 20 MEQ tablet Take 1 (20 mg) tablet by mouth in the morning and 2 (40 mg) tablets at bedtime 270 tablet 3   Saw Palmetto 450 MG CAPS Take 450 mg by mouth 2 (two) times daily.     XARELTO 20 MG TABS tablet TAKE 1 TABLET BY MOUTH DAILY WITH SUPPER. 30 tablet 5   No current facility-administered medications for this visit.    Allergies:   Patient has no known allergies.   Social History:  The patient  reports that he quit smoking about 45 years ago. His smoking use included cigarettes. He has never used smokeless tobacco. He reports current alcohol use. He reports that he does not use drugs.   Family History:  The patient's family history includes Brain cancer in his father; Cancer in his father; Colon cancer in his mother; Liver cancer in his mother.   ROS:  Please see the history of present illness.   Otherwise, review of systems is positive for none.   All other systems are reviewed and negative.   PHYSICAL EXAM: VS:  BP 116/80   Pulse 64   Ht 6' (1.829 m)   Wt 221 lb (100.2 kg)   SpO2 98%   BMI 29.97 kg/m  , BMI Body mass index is 29.97 kg/m. GEN: Well nourished, well developed, in no acute distress  HEENT: normal  Neck: no JVD, carotid bruits, or masses Cardiac: RRR; no murmurs, rubs, or gallops,no edema  Respiratory:  clear to auscultation bilaterally, normal work of  breathing GI: soft, nontender, nondistended, + BS MS: no deformity or atrophy  Skin: warm and dry Neuro:  Strength and sensation are intact Psych: euthymic mood, full affect  EKG:  EKG is ordered today. Personal review of the ekg ordered shows sinus rhythm   Recent Labs: 04/04/2022: TSH 2.91 10/27/2022: BUN 14; Creatinine, Ser 1.03; Hemoglobin 13.4; Platelets 182; Potassium 3.6; Sodium 137    Lipid Panel     Component Value Date/Time   CHOL 139 11/09/2021 0839   CHOL 139 04/22/2019 1010   TRIG 139.0 11/09/2021 0839   HDL 42.90 11/09/2021 0839   HDL 44 04/22/2019 1010   CHOLHDL 3 11/09/2021 0839   VLDL 27.8 11/09/2021 0839   LDLCALC 68 11/09/2021 0839   LDLCALC 73 04/22/2019 1010     Wt Readings from Last 3 Encounters:  12/18/22 221 lb (100.2 kg)  11/13/22 220 lb (99.8 kg)  11/09/22 220 lb (99.8 kg)      Other studies Reviewed: Additional studies/  records that were reviewed today include: TTE 06/27/21  Review of the above records today demonstrates:   1. Left ventricular ejection fraction, by estimation, is 50 to 55%. The  left ventricle has low normal function. The left ventricle demonstrates  global hypokinesis. The left ventricular internal cavity size was mildly  dilated. Left ventricular diastolic  parameters are indeterminate.   2. Right ventricular systolic function is normal. The right ventricular  size is normal.   3. Left atrial size was severely dilated.   4. Right atrial size was severely dilated.   5. The mitral valve is grossly normal. Mild mitral valve regurgitation.  No evidence of mitral stenosis.   6. The aortic valve is normal in structure. Aortic valve regurgitation is  not visualized. No aortic stenosis is present.   7. Aortic dilatation noted. There is borderline dilatation of the  ascending aorta, measuring 38 mm.   ASSESSMENT AND PLAN:  1.  Persistent atrial fibrillation: CHA2DS2-VASc of 3.  Currently on Xarelto.  Status post ablation.   Has remained in sinus rhythm.  No changes.  2.  Second hypercoagulable state: Currently on Xarelto for atrial fibrillation  3.  Hypertension: Currently well-controlled   Current medicines are reviewed at length with the patient today.   The patient does not have concerns regarding his medicines.  The following changes were made today: None  Labs/ tests ordered today include:  Orders Placed This Encounter  Procedures   EKG 12-Lead     Disposition:   FU 6 months  Signed, Haniah Penny Jorja Loa, MD  12/18/2022 4:10 PM     [facility] [address] [place] [address] [phone] (office) [phone] (fax)

## 2023-01-17 ENCOUNTER — Other Ambulatory Visit: Payer: Self-pay | Admitting: Cardiology

## 2023-01-17 DIAGNOSIS — I480 Paroxysmal atrial fibrillation: Secondary | ICD-10-CM

## 2023-01-17 NOTE — Telephone Encounter (Signed)
Prescription refill request for Xarelto received.  Indication: AF Last office visit: 12/18/22  Carleene Mains MD Weight: 100.2kg Age: 77 Scr: 1.03 on 10/27/22  Epic CrCl: 83.77  Based on above findings Xarelto 20mg  daily is the appropriate dose.  Refill approved.

## 2023-02-01 ENCOUNTER — Emergency Department (HOSPITAL_COMMUNITY): Payer: Medicare HMO

## 2023-02-01 ENCOUNTER — Encounter (HOSPITAL_COMMUNITY): Payer: Self-pay | Admitting: Emergency Medicine

## 2023-02-01 ENCOUNTER — Other Ambulatory Visit: Payer: Self-pay

## 2023-02-01 ENCOUNTER — Ambulatory Visit (INDEPENDENT_AMBULATORY_CARE_PROVIDER_SITE_OTHER)
Admission: EM | Admit: 2023-02-01 | Discharge: 2023-02-01 | Disposition: A | Discharge status: Home / Self Care | Payer: Medicare HMO | Source: Home / Self Care

## 2023-02-01 ENCOUNTER — Emergency Department (HOSPITAL_COMMUNITY)
Admission: EM | Admit: 2023-02-01 | Discharge: 2023-02-01 | Disposition: A | Discharge status: Home / Self Care | Payer: Medicare HMO | Attending: Emergency Medicine | Admitting: Emergency Medicine

## 2023-02-01 DIAGNOSIS — E039 Hypothyroidism, unspecified: Secondary | ICD-10-CM | POA: Insufficient documentation

## 2023-02-01 DIAGNOSIS — Z79899 Other long term (current) drug therapy: Secondary | ICD-10-CM | POA: Diagnosis not present

## 2023-02-01 DIAGNOSIS — R9431 Abnormal electrocardiogram [ECG] [EKG]: Secondary | ICD-10-CM | POA: Insufficient documentation

## 2023-02-01 DIAGNOSIS — I119 Hypertensive heart disease without heart failure: Secondary | ICD-10-CM | POA: Insufficient documentation

## 2023-02-01 DIAGNOSIS — R42 Dizziness and giddiness: Secondary | ICD-10-CM | POA: Insufficient documentation

## 2023-02-01 DIAGNOSIS — Z87891 Personal history of nicotine dependence: Secondary | ICD-10-CM | POA: Insufficient documentation

## 2023-02-01 DIAGNOSIS — I2510 Atherosclerotic heart disease of native coronary artery without angina pectoris: Secondary | ICD-10-CM | POA: Diagnosis not present

## 2023-02-01 DIAGNOSIS — Z7901 Long term (current) use of anticoagulants: Secondary | ICD-10-CM | POA: Diagnosis not present

## 2023-02-01 DIAGNOSIS — Z85820 Personal history of malignant melanoma of skin: Secondary | ICD-10-CM | POA: Diagnosis not present

## 2023-02-01 DIAGNOSIS — I517 Cardiomegaly: Secondary | ICD-10-CM | POA: Diagnosis not present

## 2023-02-01 LAB — BASIC METABOLIC PANEL
Anion gap: 9 (ref 5–15)
BUN: 15 mg/dL (ref 8–23)
CO2: 26 mmol/L (ref 22–32)
Calcium: 8.8 mg/dL — ABNORMAL LOW (ref 8.9–10.3)
Chloride: 102 mmol/L (ref 98–111)
Creatinine, Ser: 0.92 mg/dL (ref 0.61–1.24)
GFR, Estimated: >60 mL/min (ref >60)
Glucose, Bld: 109 mg/dL — ABNORMAL HIGH (ref 70–99)
Potassium: 3.8 mmol/L (ref 3.5–5.1)
Sodium: 137 mmol/L (ref 135–145)

## 2023-02-01 LAB — CBC
HCT: 40.2 % (ref 39.0–52.0)
Hemoglobin: 13.6 g/dL (ref 13.0–17.0)
MCH: 31.9 pg (ref 26.0–34.0)
MCHC: 33.8 g/dL (ref 30.0–36.0)
MCV: 94.1 fL (ref 80.0–100.0)
Platelets: 156 10*3/uL (ref 150–400)
RBC: 4.27 MIL/uL (ref 4.22–5.81)
RDW: 14 % (ref 11.5–15.5)
WBC: 8.7 10*3/uL (ref 4.0–10.5)
nRBC: 0 % (ref 0.0–0.2)

## 2023-02-01 LAB — TROPONIN I (HIGH SENSITIVITY)
Troponin I (High Sensitivity): 4 ng/L (ref <18)
Troponin I (High Sensitivity): 4 ng/L (ref <18)

## 2023-02-01 NOTE — ED Notes (Signed)
Patient is being discharged from the Urgent Care and sent to the Emergency Department via POV . Per Lurena Joiner, RN, patient is in need of higher level of care due to abnormal EKD. Patient is aware and verbalizes understanding of plan of care.  Vitals:   02/01/23 1706 02/01/23 1712  BP:    Pulse:    Resp:    Temp:    SpO2: 94% 97%

## 2023-02-01 NOTE — ED Notes (Signed)
Patient requesting to go home as all symptoms have resolved

## 2023-02-01 NOTE — Discharge Instructions (Addendum)
We evaluated you for your lightheadedness.  Your symptoms could have been due to mild dehydration or positional after bending over.  Your EKG did show some changes compared to your most recent EKG, but I believe that you have had similar findings on older EKGs.  Your cardiac enzymes were negative x 2.  I think the chance that your symptoms today were due to a heart attack is extremely low.  I would still recommend following up with a cardiologist.  We have placed a referral to help schedule this.  Please keep an eye on your symptoms at home.  If you develop any severe chest pain, difficulty breathing, recurrent or persistent lightheadedness, fainting, nausea or vomiting, numbness or tingling, weakness, facial droop, trouble swallowing, visual disturbances, or any other concerning symptoms please return to the emergency department.

## 2023-02-01 NOTE — ED Triage Notes (Signed)
"  I was shopping, bent over to check some things out, stood up and dizziness started, had to grab cart before falling, blurry vision just after". "Now still a little dizzy". No fall. No injury. No vomiting. No Chest pain.

## 2023-02-01 NOTE — ED Triage Notes (Signed)
PT sent from UC for abnormal EKG. Pt states he was at the grocery store and got dizzy held onto grocery cart to check out and go out to car to have sometone come pick him up and take him to UC. Pt states he just didn't feel right and didn't think he was safe to drive. Pt states he had a little chest discomfort with the dizziness but no longer feels dizzy or chest pain.

## 2023-02-01 NOTE — ED Provider Notes (Signed)
[place] EMERGENCY DEPARTMENT AT [facility] Provider Note  CSN: [id number] Arrival date & time: 02/01/23 1755  Chief Complaint(s) Dizziness and Abnormal ECG  HPI Matthew Sullivan is a 77 y.o. male with history of atrial fibrillation on Xarelto, hypertension, hyperlipidemia, coronary artery disease presenting to the emergency department with episode of lightheadedness.  He reports that he was at the grocery store, bent over to look at some deli meat, and then stood up and felt lightheaded.  He reports that he had persistent lightheadedness for a period and felt too weak to drive so family took him to the urgent care.  He reports that his symptoms are currently resolved.  At the urgent care he had an EKG which was concerning for some T wave changes so they sent him to the emergency department.  The urgent care note reported that he had been complaining of chest pain however the patient adamantly denies this to me and reports that he only had lightheadedness.  He denies any shortness of breath, leg swelling, fevers or chills, headaches, numbness or tingling, weakness, trouble swallowing, nausea vomiting, or any other new symptoms.  Currently he reports he feels at baseline.   Past Medical History Past Medical History:  Diagnosis Date   Ascending aortic aneurysm (HCC) 09/02/2021   Atrial fibrillation, currently in sinus rhythm    Atrial myxoma    Cervical osteoarthritis    History of TIA (transient ischemic attack)    History of tinnitus    Hyperlipidemia    Hypertension    Melanoma (HCC)    per Dr. Terri Piedra, local excision, no chemo/no rady tx.    Patient Active Problem List   Diagnosis Date Noted   Persistent atrial fibrillation (HCC) 03/31/2022   Hypercoagulable state due to persistent atrial fibrillation (HCC) 03/27/2022   Health care maintenance 11/16/2021   Hypothyroidism 11/16/2021   Ascending aortic aneurysm (HCC) 09/02/2021   Abnormal TSH 11/07/2020   Hyperlipidemia  09/18/2019   Pulmonary embolism (HCC) 07/13/2019   Paresthesia 06/22/2019   Medicare annual wellness visit, subsequent 07/22/2017   Advance care planning 07/22/2017   Current use of long term anticoagulation 01/23/2017   Shingles 11/13/2016   PAF (paroxysmal atrial fibrillation) (HCC) 04/29/2013   Myxoma of heart 04/07/2013   NSTEMI  04/06/2013   HTN (hypertension) 04/05/2013   Syncope 04/05/2013   Benign hypertensive heart disease without heart failure 06/22/2011   Pure hypercholesterolemia 06/22/2011   Home Medication(s) Prior to Admission medications   Medication Sig Start Date End Date Taking? Authorizing Provider  acetaminophen (TYLENOL) 500 MG tablet Take 1,000 mg by mouth every 6 (six) hours as needed for moderate pain.   Yes [provider]  atorvastatin (LIPITOR) 80 MG tablet TAKE 1/2 TABLET (40 MG TOTAL) BY MOUTH DAILY. Patient taking differently: Take 40 mg by mouth every evening. 04/20/22  Yes Nahser, Deloris Ping, MD  Fluticasone Propionate (ALLERGY RELIEF NA) Place 1 spray into the nose at bedtime as needed (allergies).   Yes [provider]  hydrochlorothiazide (HYDRODIURIL) 25 MG tablet TAKE 1 TABLET (25 MG TOTAL) BY MOUTH DAILY. 09/11/22  Yes Chilton Si, MD  levothyroxine (SYNTHROID) 25 MCG tablet Take 1 tablet (25 mcg total) by mouth daily. 04/04/22  Yes [name], MD  Menthol-Methyl Salicylate (MUSCLE RUB) 10-15 % CREA Apply 1 Application topically daily as needed for muscle pain.   Yes [provider]  metoprolol tartrate (LOPRESSOR) 25 MG tablet TAKE 1 TABLET BY MOUTH 2 TIMES DAILY. Patient  taking differently: Take 12.5 mg by mouth 2 (two) times daily. 09/11/22  Yes Chilton Si, MD  Multiple Vitamin (MULTIVITAMIN WITH MINERALS) TABS tablet Take 1 tablet by mouth daily.   Yes [provider]  potassium chloride SA (KLOR-CON M) 20 MEQ tablet Take 1 (20 mg) tablet by mouth in the morning and 2 (40 mg) tablets at  bedtime Patient taking differently: Take 20-40 mEq by mouth See admin instructions. Take 1 (20 mg) tablet by mouth in the morning and 2 (40 mg) tablets at bedtime 07/27/22  Yes Chilton Si, MD  Saw Palmetto 450 MG CAPS Take 450 mg by mouth 2 (two) times daily.   Yes [provider]  XARELTO 20 MG TABS tablet TAKE 1 TABLET BY MOUTH DAILY WITH SUPPER. Patient taking differently: Take 20 mg by mouth daily with supper. 01/17/23  Yes Regan Lemming, MD                                                                                                                                    Past Surgical History Past Surgical History:  Procedure Laterality Date   ANKLE ARTHROSCOPY WITH ARTHRODESIS Left 08/26/2015   Procedure: LEFT ANKLE ARTHROSCOPY TIBIOTALAR ARTHRODESIS;  Surgeon: Loreta Ave, MD;  Location: [facility];  Service: Orthopedics;  Laterality: Left;   APPENDECTOMY     ATRIAL FIBRILLATION ABLATION N/A 09/19/2022   Procedure: ATRIAL FIBRILLATION ABLATION;  Surgeon: Regan Lemming, MD;  Location: [facility] INVASIVE CV LAB;  Service: Cardiovascular;  Laterality: N/A;   BACK SURGERY     CARDIOVERSION N/A 06/25/2013   Procedure: CARDIOVERSION;  Surgeon: Cassell Clement, MD;  Location: [facility] ENDOSCOPY;  Service: Cardiovascular;  Laterality: N/A;   CARDIOVERSION N/A 06/23/2021   Procedure: CARDIOVERSION;  Surgeon: Vesta Mixer, MD;  Location: [facility] ENDOSCOPY;  Service: Cardiovascular;  Laterality: N/A;   CARDIOVERSION N/A 03/20/2022   Procedure: CARDIOVERSION;  Surgeon: Chilton Si, MD;  Location: [facility] ENDOSCOPY;  Service: Cardiovascular;  Laterality: N/A;   CARDIOVERSION N/A 04/12/2022   Procedure: CARDIOVERSION;  Surgeon: Parke Poisson, MD;  Location: [facility] ENDOSCOPY;  Service: Cardiovascular;  Laterality: N/A;   CARDIOVERSION N/A 10/30/2022   Procedure: CARDIOVERSION;  Surgeon: [name], MD;  Location: [facility] INVASIVE CV LAB;  Service: Cardiovascular;   Laterality: N/A;   CARDIOVERSION  10/30/2022   EXCISION OF ATRIAL MYXOMA Left 04/09/2013   Procedure: EXCISION OF ATRIAL MYXOMA;  Surgeon: Loreli Slot, MD;  Location: [facility] OR;  Service: Open Heart Surgery;  Laterality: Left;   INTRAOPERATIVE TRANSESOPHAGEAL ECHOCARDIOGRAM N/A 04/09/2013   Procedure: INTRAOPERATIVE TRANSESOPHAGEAL ECHOCARDIOGRAM;  Surgeon: Loreli Slot, MD;  Location: [facility] OR;  Service: Open Heart Surgery;  Laterality: N/A;   LEFT HEART CATHETERIZATION WITH CORONARY ANGIOGRAM N/A 04/07/2013   Procedure: LEFT HEART CATHETERIZATION WITH CORONARY ANGIOGRAM;  Surgeon: Micheline Chapman, MD;  Location: [facility] CATH LAB;  Service: Cardiovascular;  Laterality: N/A;  NASAL SEPTUM SURGERY     NOSE SURGERY     ROTATOR CUFF REPAIR     both shoulders   Family History Family History  Problem Relation Age of Onset   Colon cancer Mother        patient reported that his mother didn't have colon cancer.   Liver cancer Mother    Cancer Father    Brain cancer Father        glioblastoma   Prostate cancer Neg Hx     Social History Social History   Tobacco Use   Smoking status: Former    Current packs/day: 0.00    Types: Cigarettes    Quit date: 07/10/1977    Years since quitting: 45.5   Smokeless tobacco: Never   Tobacco comments:    Former smoker 04/20/22  Vaping Use   Vaping status: Never Used  Substance Use Topics   Alcohol use: Yes    Comment: rare   Drug use: No   Allergies Patient has no known allergies.  Review of Systems Review of Systems  All other systems reviewed and are negative.   Physical Exam Vital Signs  I have reviewed the triage vital signs BP (!) 156/88   Pulse 65   Temp 97.8 F (36.6 C) (Oral)   Resp 16   Ht 6' (1.829 m)   Wt 99.8 kg   SpO2 98%   BMI 29.84 kg/m  Physical Exam Vitals and nursing note reviewed.  Constitutional:      General: He is not in acute distress.    Appearance: Normal appearance.  HENT:     Mouth/Throat:      Mouth: Mucous membranes are moist.  Eyes:     Conjunctiva/sclera: Conjunctivae normal.  Cardiovascular:     Rate and Rhythm: Normal rate and regular rhythm.  Pulmonary:     Effort: Pulmonary effort is normal. No respiratory distress.     Breath sounds: Normal breath sounds.  Abdominal:     General: Abdomen is flat.     Palpations: Abdomen is soft.     Tenderness: There is no abdominal tenderness.  Musculoskeletal:     Right lower leg: No edema.     Left lower leg: No edema.  Skin:    General: Skin is warm and dry.     Capillary Refill: Capillary refill takes less than 2 seconds.  Neurological:     Mental Status: He is alert and oriented to person, place, and time. Mental status is at baseline.     Comments: Cranial nerves II through XII intact, strength 5 out of 5 in the bilateral upper and lower extremities, no sensory deficit to light touch, no dysmetria on finger-nose-finger testing, normal heel/shin, no dysdiadochokinesia   Psychiatric:        Mood and Affect: Mood normal.        Behavior: Behavior normal.     ED Results and Treatments Labs (all labs ordered are listed, but only abnormal results are displayed) Labs Reviewed  BASIC METABOLIC PANEL - Abnormal; Notable for the following components:      Result Value   Glucose, Bld 109 (*)    Calcium 8.8 (*)    All other components within normal limits  CBC  TROPONIN I (HIGH SENSITIVITY)  TROPONIN I (HIGH SENSITIVITY)  Radiology DG Chest 2 View  Result Date: 02/01/2023 CLINICAL DATA:  Abnormal EKG EXAM: CHEST - 2 VIEW COMPARISON:  Chest x-ray 08/19/2019 FINDINGS: Sternotomy wires are present. The heart is mildly enlarged. The lungs are clear. There is no pleural effusion or pneumothorax. No acute fractures are seen. IMPRESSION: No active cardiopulmonary disease. Mild cardiomegaly. Electronically  Signed   By: Darliss Cheney M.D.   On: 02/01/2023 19:09    Pertinent labs & imaging results that were available during my care of the patient were reviewed by me and considered in my medical decision making (see MDM for details).  Medications Ordered in ED Medications - No data to display                                                                                                                                   Procedures Procedures  (including critical care time)  Medical Decision Making / ED Course   MDM:  77 year old male presenting to the emergency department with episode of lightheadedness.  Patient well-appearing, physical exam reassuring with normal findings including normal neurologic exam.  EKG has some T wave abnormalities in anterior leads.  On review, this has been seen on previous EKGs dating back to 2020, 2021.  Technical error prevents me from finalizing EKG interpretation but appears normal sinus rhythm, nonspecific ST abnormality leads V2 through V5.  No STEMI.  Symptoms seem most likely orthostatic in nature as they occurred after bending over and standing back up and are now completely resolved.  He denies having any chest pain to me or other concerning symptoms like diaphoresis, shortness of breath, syncope.  His troponin is negative x 2.  Low concern for other process such as pulmonary embolism as the patient is on Xarelto, denies shortness of breath, not tachycardic or hypoxic.  Chest x-ray is unremarkable.  He reports sensation of dizziness/lightheadedness which sounds more consistent with the lightheadedness rather than a vertiginous symptom and his neurologic exam is reassuring with no abnormal cerebellar findings.  Given that his symptoms are all resolved, laboratory workup is reassuring, EKG appears consistent with prior, feel safe for discharge.  Recommended follow-up with cardiology for abnormal EKG finding. Will discharge patient to home. All questions  answered. Patient comfortable with plan of discharge. Return precautions discussed with patient and specified on the after visit summary.       Additional history obtained: -Additional history obtained from family -External records from outside source obtained and reviewed including: Chart review including previous notes, labs, imaging, consultation notes including UC ntoe   Lab Tests: -I ordered, reviewed, and interpreted labs.   The pertinent results include:   Labs Reviewed  BASIC METABOLIC PANEL - Abnormal; Notable for the following components:      Result Value   Glucose, Bld 109 (*)    Calcium 8.8 (*)    All other components within normal limits  CBC  TROPONIN I (HIGH  SENSITIVITY)  TROPONIN I (HIGH SENSITIVITY)    Notable for normal troponin  EKG  See MDM  Imaging Studies ordered: I ordered imaging studies including CXR On my interpretation imaging demonstrates no acute process I independently visualized and interpreted imaging. I agree with the radiologist interpretation   Medicines ordered and prescription drug management: No orders of the defined types were placed in this encounter.   -I have reviewed the patients home medicines and have made adjustments as needed Cardiac Monitoring: The patient was maintained on a cardiac monitor.  I personally viewed and interpreted the cardiac monitored which showed an underlying rhythm of: NSR  Social Determinants of Health:  Diagnosis or treatment significantly limited by social determinants of health: obesity   Reevaluation: After the interventions noted above, I reevaluated the patient and found that their symptoms have resolved  Co morbidities that complicate the patient evaluation  Past Medical History:  Diagnosis Date   Ascending aortic aneurysm (HCC) 09/02/2021   Atrial fibrillation, currently in sinus rhythm    Atrial myxoma    Cervical osteoarthritis    History of TIA (transient ischemic attack)     History of tinnitus    Hyperlipidemia    Hypertension    Melanoma (HCC)    per Dr. Terri Piedra, local excision, no chemo/no rady tx.       Dispostion: Disposition decision including need for hospitalization was considered, and patient discharged from emergency department.    Final Clinical Impression(s) / ED Diagnoses Final diagnoses:  Abnormal EKG  Lightheadedness     This chart was dictated using voice recognition software.  Despite best efforts to proofread,  errors can occur which can change the documentation meaning.    Lonell Grandchild, MD 02/01/23 [phone]

## 2023-02-01 NOTE — ED Notes (Signed)
Assumed care of patient who arrived to room via wc. Patient sent from [facility] for abnormal EKG. Patient states he was a t grocery store bent over to pick something up and became dizzy. Patient reports all symptoms have resolved however UC stated he needed to come have further evaluation secondary to symptoms and EKG results. Patient a/o x 4 respirations even and non labored denies dizziness sob and cp at present time.

## 2023-02-01 NOTE — ED Provider Notes (Signed)
[facility]    CSN: [id number] Arrival date & time: 02/01/23  1648      History   Chief Complaint Chief Complaint  Patient presents with   Dizziness    HPI Matthew Sullivan is a 77 y.o. male.   Patient here today with wife and son for evaluation of dizziness that started earlier today.  He states that he was shopping bent over to check some things out and stood up and had acute onset of dizziness.  He states he had to grab the cart before he fell and had some mild blurry vision after.  He states that dizziness has improved somewhat since onset.  He notes some mild chest discomfort with dizziness but this has resolved. No active sweating but states he did feel clammy during symptoms. He denies any shortness of breath.  He denies any fall or injury.  He has not any vomiting.  Past medical history is significant for A-fib with recent cardioversion.  The history is provided by the patient and the spouse.  Dizziness Associated symptoms: chest pain   Associated symptoms: no shortness of breath and no vomiting     Past Medical History:  Diagnosis Date   Ascending aortic aneurysm (HCC) 09/02/2021   Atrial fibrillation, currently in sinus rhythm    Atrial myxoma    Cervical osteoarthritis    History of TIA (transient ischemic attack)    History of tinnitus    Hyperlipidemia    Hypertension    Melanoma (HCC)    per Dr. Terri Piedra, local excision, no chemo/no rady tx.     Patient Active Problem List   Diagnosis Date Noted   Persistent atrial fibrillation (HCC) 03/31/2022   Hypercoagulable state due to persistent atrial fibrillation (HCC) 03/27/2022   Health care maintenance 11/16/2021   Hypothyroidism 11/16/2021   Ascending aortic aneurysm (HCC) 09/02/2021   Abnormal TSH 11/07/2020   Hyperlipidemia 09/18/2019   Pulmonary embolism (HCC) 07/13/2019   Paresthesia 06/22/2019   Medicare annual wellness visit, subsequent 07/22/2017   Advance care planning 07/22/2017    Current use of long term anticoagulation 01/23/2017   Shingles 11/13/2016   PAF (paroxysmal atrial fibrillation) (HCC) 04/29/2013   Myxoma of heart 04/07/2013   NSTEMI  04/06/2013   HTN (hypertension) 04/05/2013   Syncope 04/05/2013   Benign hypertensive heart disease without heart failure 06/22/2011   Pure hypercholesterolemia 06/22/2011    Past Surgical History:  Procedure Laterality Date   ANKLE ARTHROSCOPY WITH ARTHRODESIS Left 08/26/2015   Procedure: LEFT ANKLE ARTHROSCOPY TIBIOTALAR ARTHRODESIS;  Surgeon: Loreta Ave, MD;  Location: [facility];  Service: Orthopedics;  Laterality: Left;   APPENDECTOMY     ATRIAL FIBRILLATION ABLATION N/A 09/19/2022   Procedure: ATRIAL FIBRILLATION ABLATION;  Surgeon: Regan Lemming, MD;  Location: [facility];  Service: Cardiovascular;  Laterality: N/A;   BACK SURGERY     CARDIOVERSION N/A 06/25/2013   Procedure: CARDIOVERSION;  Surgeon: Cassell Clement, MD;  Location: [facility] ENDOSCOPY;  Service: Cardiovascular;  Laterality: N/A;   CARDIOVERSION N/A 06/23/2021   Procedure: CARDIOVERSION;  Surgeon: Vesta Mixer, MD;  Location: [facility] ENDOSCOPY;  Service: Cardiovascular;  Laterality: N/A;   CARDIOVERSION N/A 03/20/2022   Procedure: CARDIOVERSION;  Surgeon: Chilton Si, MD;  Location: [facility] ENDOSCOPY;  Service: Cardiovascular;  Laterality: N/A;   CARDIOVERSION N/A 04/12/2022   Procedure: CARDIOVERSION;  Surgeon: Parke Poisson, MD;  Location: [facility] ENDOSCOPY;  Service: Cardiovascular;  Laterality: N/A;   CARDIOVERSION N/A 10/30/2022  Procedure: CARDIOVERSION;  Surgeon: [name], MD;  Location: [facility]AS[facility] CV LAB;  Service: Cardiovascular;  Laterality: N/A;   CARDIOVERSION  10/30/2022   EXCISION OF ATRIAL MYXOMA Left 04/09/2013   Procedure: EXCISION OF ATRIAL MYXOMA;  Surgeon: Loreli Slot, MD;  Location: [facility] OR;  Service: Open Heart Surgery;  Laterality: Left;   INTRAOPERATIVE TRANSESOPHAGEAL  ECHOCARDIOGRAM N/A 04/09/2013   Procedure: INTRAOPERATIVE TRANSESOPHAGEAL ECHOCARDIOGRAM;  Surgeon: Loreli Slot, MD;  Location: [facility] OR;  Service: Open Heart Surgery;  Laterality: N/A;   LEFT HEART CATHETERIZATION WITH CORONARY ANGIOGRAM N/A 04/07/2013   Procedure: LEFT HEART CATHETERIZATION WITH CORONARY ANGIOGRAM;  Surgeon: Micheline Chapman, MD;  Location: [facility] CATH LAB;  Service: Cardiovascular;  Laterality: N/A;   NASAL SEPTUM SURGERY     NOSE SURGERY     ROTATOR CUFF REPAIR     both shoulders       Home Medications    Prior to Admission medications   Medication Sig Start Date End Date Taking? Authorizing Provider  hydrochlorothiazide (HYDRODIURIL) 25 MG tablet TAKE 1 TABLET (25 MG TOTAL) BY MOUTH DAILY. 09/11/22  Yes Chilton Si, MD  levothyroxine (SYNTHROID) 25 MCG tablet Take 1 tablet (25 mcg total) by mouth daily. 04/04/22  Yes [name], MD  metoprolol tartrate (LOPRESSOR) 25 MG tablet TAKE 1 TABLET BY MOUTH 2 TIMES DAILY. Patient taking differently: Take 12.5 mg by mouth 2 (two) times daily. 09/11/22  Yes Chilton Si, MD  potassium chloride SA (KLOR-CON M) 20 MEQ tablet Take 1 (20 mg) tablet by mouth in the morning and 2 (40 mg) tablets at bedtime 07/27/22  Yes Chilton Si, MD  Saw Palmetto 450 MG CAPS Take 450 mg by mouth 2 (two) times daily.   Yes [provider]  acetaminophen (TYLENOL) 500 MG tablet Take 1,000 mg by mouth every 6 (six) hours as needed for moderate pain.    [provider]  atorvastatin (LIPITOR) 80 MG tablet TAKE 1/2 TABLET (40 MG TOTAL) BY MOUTH DAILY. 04/20/22   Nahser, Deloris Ping, MD  Fluticasone Propionate (ALLERGY RELIEF NA) Place 1 spray into the nose at bedtime as needed (allergies).    [provider]  Menthol-Methyl Salicylate (MUSCLE RUB) 10-15 % CREA Apply 1 Application topically as needed for muscle pain.    [provider]  Multiple Vitamin (MULTIVITAMIN WITH MINERALS) TABS tablet Take 1  tablet by mouth daily.    [provider]  XARELTO 20 MG TABS tablet TAKE 1 TABLET BY MOUTH DAILY WITH SUPPER. 01/17/23   Camnitz, Andree Coss, MD    Family History Family History  Problem Relation Age of Onset   Colon cancer Mother        patient reported that his mother didn't have colon cancer.   Liver cancer Mother    Cancer Father    Brain cancer Father        glioblastoma   Prostate cancer Neg Hx     Social History Social History   Tobacco Use   Smoking status: Former    Current packs/day: 0.00    Types: Cigarettes    Quit date: 07/10/1977    Years since quitting: 45.5   Smokeless tobacco: Never   Tobacco comments:    Former smoker 04/20/22  Vaping Use   Vaping status: Never Used  Substance Use Topics   Alcohol use: Yes    Comment: rare   Drug use: No     Allergies   Patient has no known  allergies.   Review of Systems Review of Systems  Constitutional:  Negative for chills and fever.  Eyes:  Negative for discharge and redness.  Respiratory:  Negative for shortness of breath.   Cardiovascular:  Positive for chest pain.  Gastrointestinal:  Negative for vomiting.  Neurological:  Positive for dizziness. Negative for numbness.     Physical Exam Triage Vital Signs ED Triage Vitals  Encounter Vitals Group     BP 02/01/23 1659 (!) 146/72     Systolic BP Percentile --      Diastolic BP Percentile --      Pulse Rate 02/01/23 1659 (!) 55     Resp 02/01/23 1659 18     Temp 02/01/23 1659 (!) 97.4 F (36.3 C)     Temp Source 02/01/23 1659 Oral     SpO2 02/01/23 1659 94 %     Weight 02/01/23 1708 220 lb (99.8 kg)     Height 02/01/23 1708 6' (1.829 m)     Head Circumference --      Peak Flow --      Pain Score 02/01/23 1707 0     Pain Loc --      Pain Education --      Exclude from Growth Chart --    No data found.  Updated Vital Signs BP (!) 146/72 (BP Location: Left Arm)   Pulse (!) 58   Temp (!) 97.4 F (36.3 C) (Oral)   Resp 18   Ht 6'  (1.829 m)   Wt 220 lb (99.8 kg)   SpO2 97%   BMI 29.84 kg/m      Physical Exam Vitals and nursing note reviewed.  Constitutional:      General: He is not in acute distress.    Appearance: Normal appearance. He is not ill-appearing.  HENT:     Head: Normocephalic and atraumatic.  Eyes:     Conjunctiva/sclera: Conjunctivae normal.  Cardiovascular:     Rate and Rhythm: Bradycardia present.  Pulmonary:     Effort: Pulmonary effort is normal. No respiratory distress.  Neurological:     Mental Status: He is alert.  Psychiatric:        Mood and Affect: Mood normal.        Behavior: Behavior normal.        Thought Content: Thought content normal.      UC Treatments / Results  Labs (all labs ordered are listed, but only abnormal results are displayed) Labs Reviewed - No data to display  EKG   Radiology DG Chest 2 View  Result Date: 02/01/2023 CLINICAL DATA:  Abnormal EKG EXAM: CHEST - 2 VIEW COMPARISON:  Chest x-ray 08/19/2019 FINDINGS: Sternotomy wires are present. The heart is mildly enlarged. The lungs are clear. There is no pleural effusion or pneumothorax. No acute fractures are seen. IMPRESSION: No active cardiopulmonary disease. Mild cardiomegaly. Electronically Signed   By: Darliss Cheney M.D.   On: 02/01/2023 19:09    Procedures Procedures (including critical care time)  Medications Ordered in UC Medications - No data to display  Initial Impression / Assessment and Plan / UC Course  I have reviewed the triage vital signs and the nursing notes.  Pertinent labs & imaging results that were available during my care of the patient were reviewed by me and considered in my medical decision making (see chart for details).    EKG with new T wave inversions to V3 V4 V5 and V6 in comparison to EKG from 2  months ago.  Recommended further evaluation in the emergency room for stat labs, imaging and cardiac monitoring.  Discussed option for EMS transport but family declines as  son was a paramedic and feels comfortable with tranport.  They are to transport via POV immediately to ED from our office.  Final Clinical Impressions(s) / UC Diagnoses   Final diagnoses:  EKG, abnormal   Discharge Instructions   None    ED Prescriptions   None    PDMP not reviewed this encounter.   Tomi Bamberger, PA-C 02/01/23 1911

## 2023-02-05 ENCOUNTER — Telehealth (HOSPITAL_BASED_OUTPATIENT_CLINIC_OR_DEPARTMENT_OTHER): Payer: Self-pay | Admitting: Cardiovascular Disease

## 2023-02-05 NOTE — Telephone Encounter (Signed)
Patient is calling requesting Dr. Duke Salvia review his hospital visit notes from 07/25. He states his vital has been fine, so he feels his equilibrium is off. Due to this he is wanting to know if she should follow up with heart care or reach out to EN&T from here. Please advise.

## 2023-02-05 NOTE — Telephone Encounter (Signed)
Returned call to patient,   Chart reviewed- patient is due for follow up with Dr. Duke Salvia.   Upon calling patient he wants to know if he can see Dr. Elberta Fortis. Advised patient we could ask if he can be added to schedule.   Patient states he also is needing to see ENT due to balance and dizziness problems, recommended he schedule that. Advised patient we would still like to see him due to being in ED with abnormal EKG. Patient scheduled for follow up.

## 2023-02-06 ENCOUNTER — Other Ambulatory Visit: Payer: Self-pay | Admitting: Family Medicine

## 2023-02-06 DIAGNOSIS — E039 Hypothyroidism, unspecified: Secondary | ICD-10-CM

## 2023-02-06 NOTE — Telephone Encounter (Signed)
Refill request for LEVOTHYROXINE 25 MCG TABLET 25 Tablet   LOV - 11/15/21 Next OV - not scheduled yet Last refill - 04/04/22 #90/1

## 2023-02-06 NOTE — Telephone Encounter (Signed)
Patient is due for medicare wellness part 2 with Dr. Para March. Please call patient to schedule appt.

## 2023-02-07 ENCOUNTER — Telehealth: Payer: Self-pay

## 2023-02-07 NOTE — Telephone Encounter (Signed)
Sent. Thanks.  Please schedule.

## 2023-02-07 NOTE — Telephone Encounter (Signed)
Transition Care Management Unsuccessful Follow-up Telephone Call  Date of discharge and from where:  [facility] 7/25  Attempts:  1st Attempt  Reason for unsuccessful TCM follow-up call:  No answer/busy   [name] [facility] Guide, [facility] [phone] [address] [place], [place], [place] [address] Phone: [phone] Email: [email or web address]

## 2023-02-07 NOTE — Telephone Encounter (Signed)
Lvmtcb, sent mychart message  

## 2023-02-08 ENCOUNTER — Telehealth: Payer: Self-pay

## 2023-02-08 NOTE — Telephone Encounter (Signed)
Transition Care Management Unsuccessful Follow-up Telephone Call  Date of discharge and from where:  [facility]  Attempts:  2nd Attempt  Reason for unsuccessful TCM follow-up call:  No answer/busy   [name] [facility] Guide, [facility] [phone] [address] [place], [place], [place] [address] Phone: [phone] Email: [email or web address]

## 2023-02-15 DIAGNOSIS — H832X2 Labyrinthine dysfunction, left ear: Secondary | ICD-10-CM | POA: Insufficient documentation

## 2023-02-15 DIAGNOSIS — H81399 Other peripheral vertigo, unspecified ear: Secondary | ICD-10-CM | POA: Diagnosis not present

## 2023-02-15 DIAGNOSIS — H9120 Sudden idiopathic hearing loss, unspecified ear: Secondary | ICD-10-CM | POA: Diagnosis not present

## 2023-02-16 ENCOUNTER — Ambulatory Visit (HOSPITAL_BASED_OUTPATIENT_CLINIC_OR_DEPARTMENT_OTHER): Payer: Medicare HMO | Admitting: Family

## 2023-02-16 ENCOUNTER — Encounter (HOSPITAL_BASED_OUTPATIENT_CLINIC_OR_DEPARTMENT_OTHER): Payer: Self-pay | Admitting: Family

## 2023-02-16 VITALS — BP 134/86 | HR 59 | Ht 72.0 in | Wt 221.0 lb

## 2023-02-16 DIAGNOSIS — I4819 Other persistent atrial fibrillation: Secondary | ICD-10-CM | POA: Diagnosis not present

## 2023-02-16 DIAGNOSIS — I10 Essential (primary) hypertension: Secondary | ICD-10-CM

## 2023-02-16 DIAGNOSIS — I2510 Atherosclerotic heart disease of native coronary artery without angina pectoris: Secondary | ICD-10-CM | POA: Diagnosis not present

## 2023-02-16 DIAGNOSIS — E785 Hyperlipidemia, unspecified: Secondary | ICD-10-CM | POA: Diagnosis not present

## 2023-02-16 DIAGNOSIS — D6859 Other primary thrombophilia: Secondary | ICD-10-CM | POA: Diagnosis not present

## 2023-02-16 NOTE — Progress Notes (Unsigned)
Cardiology Office Note:  .   Date:  02/16/2023  ID:  Matthew Sullivan, DOB 1945/12/15, MRN [id number] PCP: [name], MD  [facility] Providers Cardiologist:  Kristeen Miss, MD Electrophysiologist:  Will Jorja Loa, MD { Click to update primary MD,subspecialty MD or APP then REFRESH:1}   History of Present Illness: .   Matthew Sullivan is a 77 y.o. male ***   Coronary Arteries: CAC score of 636, which is 69 percentile for age-, race-, and sex-matched controls. Normal coronary origin. Right dominance. The study was performed without use of NTG and is insufficient for plaque evaluation.  Saw urgent care 02/01/23 new TWI V3, V4, V5, V6.  Shopping in the deli and was bending over for a few minutes - stood up felt dizzy and it cleared up some but did not totally resolve.   Went to ear doctor, Dr. Pollyann Kennedy, yesterday due to hearing in his left ear coming and going  03/2022 has had prior TWI Concerned about   BP at home has been at home 120s/78.   Presently half tablet morning and bedroom  Felt better whe nheart rate was 68 bpm.   ROS: Please see the history of present illness.    All other systems reviewed and are negative.   Studies Reviewed: .        Cardiac Studies & Procedures       ECHOCARDIOGRAM  ECHOCARDIOGRAM COMPLETE 06/27/2021  Narrative ECHOCARDIOGRAM REPORT    Patient Name:   Matthew Sullivan Date of Exam: 06/27/2021 Medical Rec #:  [id number]       Height:       72.0 in Accession #:    [id number]      Weight:       220.0 lb Date of Birth:  [birth year]       BSA:          2.219 m Patient Age:    75 years        BP:           134/80 mmHg Patient Gender: M               HR:           57 bpm. Exam Location:  Outpatient  Procedure: 2D Echo, Color Doppler, Cardiac Doppler and Strain Analysis  Indications:    I48.91* Unspeicified atrial fibrillation  History:        Patient has prior history of Echocardiogram examinations, most recent  07/23/2014. Previous Myocardial Infarction, Cardioversion 06/23/2021, TIA, Arrythmias:Atrial Fibrillation; Risk Factors:Hypertension, Dyslipidemia and Former Smoker. Patient denies chest pain, SOB or leg edema. He had a left atrial myxoma removed 2013.  Sonographer:    Carlos American RVT, RDCS (AE), RDMS Referring Phys: [phone] [facility] [place]   Sonographer Comments: Patient has left shoulder pain today and wasn't able to raise it. IMPRESSIONS   1. Left ventricular ejection fraction, by estimation, is 50 to 55%. The left ventricle has low normal function. The left ventricle demonstrates global hypokinesis. The left ventricular internal cavity size was mildly dilated. Left ventricular diastolic parameters are indeterminate. 2. Right ventricular systolic function is normal. The right ventricular size is normal. 3. Left atrial size was severely dilated. 4. Right atrial size was severely dilated. 5. The mitral valve is grossly normal. Mild mitral valve regurgitation. No evidence of mitral stenosis. 6. The aortic valve is normal in structure. Aortic valve regurgitation is not visualized. No aortic stenosis is present. 7. Aortic dilatation noted. There  is borderline dilatation of the ascending aorta, measuring 38 mm.  Comparison(s): EF 50%.  FINDINGS Left Ventricle: Left ventricular ejection fraction, by estimation, is 50 to 55%. The left ventricle has low normal function. The left ventricle demonstrates global hypokinesis. The left ventricular internal cavity size was mildly dilated. There is no left ventricular hypertrophy. Left ventricular diastolic parameters are indeterminate.  Right Ventricle: The right ventricular size is normal. No increase in right ventricular wall thickness. Right ventricular systolic function is normal.  Left Atrium: Left atrial size was severely dilated.  Right Atrium: Right atrial size was severely dilated.  Pericardium: There is no evidence of pericardial  effusion.  Mitral Valve: The mitral valve is grossly normal. Mild mitral valve regurgitation. No evidence of mitral valve stenosis.  Tricuspid Valve: The tricuspid valve is grossly normal. Tricuspid valve regurgitation is trivial.  Aortic Valve: The aortic valve is normal in structure. Aortic valve regurgitation is not visualized. No aortic stenosis is present. Aortic valve mean gradient measures 2.0 mmHg. Aortic valve peak gradient measures 5.6 mmHg. Aortic valve area, by VTI measures 2.95 cm.  Pulmonic Valve: The pulmonic valve was grossly normal. Pulmonic valve regurgitation is not visualized.  Aorta: Aortic dilatation noted. There is borderline dilatation of the ascending aorta, measuring 38 mm.  IAS/Shunts: The atrial septum is grossly normal.   LEFT VENTRICLE PLAX 2D LVIDd:         5.29 cm     Diastology LVIDs:         3.96 cm     LV e' medial:    6.74 cm/s LV PW:         1.01 cm     LV E/e' medial:  17.7 LV IVS:        0.84 cm     LV e' lateral:   16.00 cm/s LVOT diam:     2.20 cm     LV E/e' lateral: 7.4 LV SV:         72 LV SV Index:   33 LVOT Area:     3.80 cm  3D Volume EF: LV Volumes (MOD)           3D EF:        64 % LV vol d, MOD A2C: 78.9 ml LV EDV:       127 ml LV vol d, MOD A4C: 92.5 ml LV ESV:       46 ml LV vol s, MOD A2C: 31.8 ml LV SV:        81 ml LV vol s, MOD A4C: 43.3 ml LV SV MOD A2C:     47.1 ml LV SV MOD A4C:     92.5 ml LV SV MOD BP:      49.3 ml  RIGHT VENTRICLE RV S prime:     12.80 cm/s TAPSE (M-mode): 1.7 cm  LEFT ATRIUM              Index        RIGHT ATRIUM           Index LA diam:        4.70 cm  2.12 cm/m   RA Area:     30.30 cm LA Vol (A2C):   112.5 ml 50.71 ml/m  RA Volume:   111.00 ml 50.03 ml/m LA Vol (A4C):   148.0 ml 66.71 ml/m LA Biplane Vol: 145.0 ml 65.36 ml/m AORTIC VALVE  PULMONIC VALVE AV Area (Vmax):    2.91 cm     PV Vmax:       0.66 m/s AV Area (Vmean):   3.24 cm     PV Peak grad:  1.7  mmHg AV Area (VTI):     2.95 cm AV Vmax:           118.00 cm/s AV Vmean:          67.000 cm/s AV VTI:            0.245 m AV Peak Grad:      5.6 mmHg AV Mean Grad:      2.0 mmHg LVOT Vmax:         90.20 cm/s LVOT Vmean:        57.100 cm/s LVOT VTI:          0.190 m LVOT/AV VTI ratio: 0.78  AORTA Ao Root diam: 3.70 cm Ao Asc diam:  3.80 cm Ao Arch diam: 3.4 cm  MITRAL VALVE                TRICUSPID VALVE MV Area (PHT): 6.48 cm     TR Peak grad:   26.0 mmHg MV Decel Time: 117 msec     TR Vmax:        255.00 cm/s MV E velocity: 119.00 cm/s MV A velocity: 34.60 cm/s   SHUNTS MV E/A ratio:  3.44         Systemic VTI:  0.19 m Systemic Diam: 2.20 cm  Kristeen Miss MD Electronically signed by Kristeen Miss MD Signature Date/Time: 06/27/2021/4:28:56 PM    Final             Risk Assessment/Calculations:   {Does this patient have ATRIAL FIBRILLATION?:[phone]}         Physical Exam:   VS:  BP 134/86   Pulse (!) 59   Ht 6' (1.829 m)   Wt 221 lb (100.2 kg)   BMI 29.97 kg/m    Wt Readings from Last 3 Encounters:  02/16/23 221 lb (100.2 kg)  02/01/23 220 lb (99.8 kg)  02/01/23 220 lb (99.8 kg)    GEN: Well nourished, well developed in no acute distress NECK: No JVD; No carotid bruits CARDIAC: ***RRR, no murmurs, rubs, gallops RESPIRATORY:  Clear to auscultation without rales, wheezing or rhonchi  ABDOMEN: Soft, non-tender, non-distended EXTREMITIES:  No edema; No deformity   ASSESSMENT AND PLAN: .   ***    {Are you ordering a CV Procedure (e.g. stress test, cath, DCCV, TEE, etc)?   Press F2        :[id number]}  Dispo: ***  Signed, Alver Sorrow, NP

## 2023-02-16 NOTE — Patient Instructions (Addendum)
Medication Instructions:  Your physician has recommended you make the following change in your medication:  STOP Metoprolol  We will send you a MyChart message in 2 weeks to check on your heart rate, blood pressure, energy level  *If you need a refill on your cardiac medications before your next appointment, please call your pharmacy*   Lab Work: Your physician recommends that you return for lab work today: direct LDL, lipid panel, hepatic panel  If you have labs (blood work) drawn today and your tests are completely normal, you will receive your results only by: MyChart Message (if you have MyChart) OR A paper copy in the mail If you have any lab test that is abnormal or we need to change your treatment, we will call you to review the results.   Testing/Procedures: Your EKG today looked great  Follow-Up: At [facility], you and your health needs are our priority.  As part of our continuing mission to provide you with exceptional heart care, we have created designated Provider Care Teams.  These Care Teams include your primary Cardiologist (physician) and Advanced Practice Providers (APPs -  Physician Assistants and Nurse Practitioners) who all work together to provide you with the care you need, when you need it.  We recommend signing up for the patient portal called "MyChart".  Sign up information is provided on this After Visit Summary.  MyChart is used to connect with patients for Virtual Visits (Telemedicine).  Patients are able to view lab/test results, encounter notes, upcoming appointments, etc.  Non-urgent messages can be sent to your provider as well.   To learn more about what you can do with MyChart, go to [email or web address].    Your next appointment:   1 year(s)  Provider:   Chilton Si, MD    Other Instructions  Heart Healthy Diet Recommendations: A low-salt diet is recommended. Meats should be grilled, baked, or boiled. Avoid fried foods. Focus  on lean protein sources like fish or chicken with vegetables and fruits. The American Heart Association is a Chief Technology Officer!  American Heart Association Diet and Lifeystyle Recommendations   Exercise recommendations: The American Heart Association recommends 150 minutes of moderate intensity exercise weekly. Try 30 minutes of moderate intensity exercise 4-5 times per week. This could include walking, jogging, or swimming.

## 2023-02-18 ENCOUNTER — Encounter (HOSPITAL_BASED_OUTPATIENT_CLINIC_OR_DEPARTMENT_OTHER): Payer: Self-pay | Admitting: Family

## 2023-03-02 ENCOUNTER — Encounter (HOSPITAL_BASED_OUTPATIENT_CLINIC_OR_DEPARTMENT_OTHER): Payer: Self-pay

## 2023-03-02 NOTE — Telephone Encounter (Signed)
BP log as requested 

## 2023-03-09 DIAGNOSIS — H903 Sensorineural hearing loss, bilateral: Secondary | ICD-10-CM | POA: Insufficient documentation

## 2023-04-12 DIAGNOSIS — R42 Dizziness and giddiness: Secondary | ICD-10-CM | POA: Diagnosis not present

## 2023-04-12 DIAGNOSIS — H9122 Sudden idiopathic hearing loss, left ear: Secondary | ICD-10-CM | POA: Diagnosis not present

## 2023-04-18 ENCOUNTER — Other Ambulatory Visit: Payer: Self-pay | Admitting: Cardiovascular Disease

## 2023-05-10 DIAGNOSIS — Z85820 Personal history of malignant melanoma of skin: Secondary | ICD-10-CM | POA: Diagnosis not present

## 2023-05-10 DIAGNOSIS — L814 Other melanin hyperpigmentation: Secondary | ICD-10-CM | POA: Diagnosis not present

## 2023-05-10 DIAGNOSIS — Z85828 Personal history of other malignant neoplasm of skin: Secondary | ICD-10-CM | POA: Diagnosis not present

## 2023-05-10 DIAGNOSIS — L821 Other seborrheic keratosis: Secondary | ICD-10-CM | POA: Diagnosis not present

## 2023-05-10 DIAGNOSIS — Z08 Encounter for follow-up examination after completed treatment for malignant neoplasm: Secondary | ICD-10-CM | POA: Diagnosis not present

## 2023-05-10 DIAGNOSIS — L570 Actinic keratosis: Secondary | ICD-10-CM | POA: Diagnosis not present

## 2023-05-10 DIAGNOSIS — D225 Melanocytic nevi of trunk: Secondary | ICD-10-CM | POA: Diagnosis not present

## 2023-06-04 DIAGNOSIS — H43811 Vitreous degeneration, right eye: Secondary | ICD-10-CM | POA: Diagnosis not present

## 2023-06-04 DIAGNOSIS — M25562 Pain in left knee: Secondary | ICD-10-CM | POA: Diagnosis not present

## 2023-06-24 ENCOUNTER — Other Ambulatory Visit: Payer: Self-pay | Admitting: Family Medicine

## 2023-06-24 DIAGNOSIS — I10 Essential (primary) hypertension: Secondary | ICD-10-CM

## 2023-06-25 ENCOUNTER — Other Ambulatory Visit (INDEPENDENT_AMBULATORY_CARE_PROVIDER_SITE_OTHER): Payer: Medicare HMO

## 2023-06-25 DIAGNOSIS — I10 Essential (primary) hypertension: Secondary | ICD-10-CM | POA: Diagnosis not present

## 2023-06-25 LAB — COMPREHENSIVE METABOLIC PANEL
ALT: 32 U/L (ref 0–53)
AST: 21 U/L (ref 0–37)
Albumin: 4.3 g/dL (ref 3.5–5.2)
Alkaline Phosphatase: 74 U/L (ref 39–117)
BUN: 16 mg/dL (ref 6–23)
CO2: 31 meq/L (ref 19–32)
Calcium: 8.7 mg/dL (ref 8.4–10.5)
Chloride: 103 meq/L (ref 96–112)
Creatinine, Ser: 0.96 mg/dL (ref 0.40–1.50)
GFR: 75.43 mL/min (ref >60.00)
Glucose, Bld: 106 mg/dL — ABNORMAL HIGH (ref 70–99)
Potassium: 4 meq/L (ref 3.5–5.1)
Sodium: 141 meq/L (ref 135–145)
Total Bilirubin: 0.9 mg/dL (ref 0.2–1.2)
Total Protein: 6.1 g/dL (ref 6.0–8.3)

## 2023-06-25 LAB — TSH: TSH: 3.96 u[IU]/mL (ref 0.35–5.50)

## 2023-07-02 ENCOUNTER — Ambulatory Visit (INDEPENDENT_AMBULATORY_CARE_PROVIDER_SITE_OTHER): Payer: Medicare HMO | Admitting: Family Medicine

## 2023-07-02 ENCOUNTER — Encounter: Payer: Self-pay | Admitting: Family Medicine

## 2023-07-02 VITALS — BP 128/62 | HR 55 | Temp 97.7°F | Ht 71.26 in | Wt 212.2 lb

## 2023-07-02 DIAGNOSIS — Z0000 Encounter for general adult medical examination without abnormal findings: Secondary | ICD-10-CM

## 2023-07-02 DIAGNOSIS — H8109 Meniere's disease, unspecified ear: Secondary | ICD-10-CM

## 2023-07-02 DIAGNOSIS — E782 Mixed hyperlipidemia: Secondary | ICD-10-CM

## 2023-07-02 DIAGNOSIS — R0989 Other specified symptoms and signs involving the circulatory and respiratory systems: Secondary | ICD-10-CM

## 2023-07-02 DIAGNOSIS — Z7189 Other specified counseling: Secondary | ICD-10-CM

## 2023-07-02 DIAGNOSIS — I480 Paroxysmal atrial fibrillation: Secondary | ICD-10-CM

## 2023-07-02 DIAGNOSIS — E039 Hypothyroidism, unspecified: Secondary | ICD-10-CM | POA: Diagnosis not present

## 2023-07-02 DIAGNOSIS — I10 Essential (primary) hypertension: Secondary | ICD-10-CM | POA: Diagnosis not present

## 2023-07-02 DIAGNOSIS — I7121 Aneurysm of the ascending aorta, without rupture: Secondary | ICD-10-CM

## 2023-07-02 DIAGNOSIS — Z23 Encounter for immunization: Secondary | ICD-10-CM

## 2023-07-02 MED ORDER — LEVOTHYROXINE SODIUM 25 MCG PO TABS
25.0000 ug | ORAL_TABLET | Freq: Every day | ORAL | 3 refills | Status: DC
Start: 2023-07-02 — End: 2024-05-27

## 2023-07-02 NOTE — Progress Notes (Signed)
Meniere's dx, d/w pt.  Had ENT eval.  He had some return of hearing in the L ear.  No vertigo now.  Cautions d/w pt.    Hypothyroid.  On replacement.  Labs d/w pt.  Compliant. No neck mass.     Elevated Cholesterol: Using medications without problems: yes Muscle aches: some cramping in the legs at night.  Not exertional.  Diet compliance: yes Exercise: yes   Hypertension:               Using medication without problems or lightheadedness: yes Chest pain with exertion:no Edema:no Short of breath:no Cramping improved with taking potassium.    He had lower pulse on metoprolol and felt better off med.  No heart racing.     H/o AF.  Still on anticoagulation.  No bleeding.  No blood in stool, etc.     H/o melanoma per derm, has routine f/u.     Ascending aorta is mildly dilated, followed by cards.  I will defer.  He agrees.    He doesn't have claudication.  See foot exam below.     Flu 2024 Shingles discussed with patient. PNA 2022 Tetanus discussed with patient.  May be cheaper pharmacy COVID-vaccine encouraged. Cologuard neg 2022, d/w pt.   Prostate cancer screening and PSA options (with potential risks and benefits of testing vs not testing) were discussed along with recent recs/guidelines.  He declined testing PSA at this point. Advance directive-wife and son equally designated if patient were incapacitated.  Meds, vitals, and allergies reviewed.   ROS: Per HPI unless specifically indicated in ROS section   GEN: nad, alert and oriented HEENT: mucous membranes moist NECK: supple w/o LA, no TMG CV: rrr PULM: ctab, no inc wob ABD: soft, +bs EXT: no edema SKIN: well perfused.  Absent L DP and PT pulse.  Normal R DP pulse.   Normal radial pulses.    40 minutes were devoted to patient care in this encounter (this includes time spent reviewing the patient's file/history, interviewing and examining the patient, counseling/reviewing plan with patient).

## 2023-07-02 NOTE — Patient Instructions (Signed)
Check with the pharmacy about getting a Tdap.  Take care.  Glad to see you.

## 2023-07-02 NOTE — Assessment & Plan Note (Signed)
Advance directive- wife and son equally designated if patient were incapacitated.

## 2023-07-07 DIAGNOSIS — R0989 Other specified symptoms and signs involving the circulatory and respiratory systems: Secondary | ICD-10-CM | POA: Insufficient documentation

## 2023-07-07 DIAGNOSIS — H8109 Meniere's disease, unspecified ear: Secondary | ICD-10-CM | POA: Insufficient documentation

## 2023-07-07 NOTE — Assessment & Plan Note (Addendum)
Absent L DP and PT pulse.  Normal R DP pulse.   Normal radial pulses.    No claudication or skin breakdown.  Lower extremity arterial studies ordered.  Rationale for evaluation discussed with patient.

## 2023-07-07 NOTE — Assessment & Plan Note (Signed)
Ascending aorta is mildly dilated, followed by cards.  I will defer.  He agrees.

## 2023-07-07 NOTE — Assessment & Plan Note (Signed)
Flu 2024 Shingles discussed with patient. PNA 2022 Tetanus discussed with patient.  May be cheaper pharmacy COVID-vaccine encouraged. Cologuard neg 2022, d/w pt.   Prostate cancer screening and PSA options (with potential risks and benefits of testing vs not testing) were discussed along with recent recs/guidelines.  He declined testing PSA at this point. Advance directive-wife and son equally designated if patient were incapacitated.

## 2023-07-07 NOTE — Assessment & Plan Note (Signed)
Continue levothyroxine as is. 

## 2023-07-07 NOTE — Assessment & Plan Note (Signed)
Continue anticoagulation with Xarelto.  No bleeding.  He felt better off metoprolol and his blood pressure/pulse are reasonable.

## 2023-07-07 NOTE — Assessment & Plan Note (Signed)
Per ENT

## 2023-07-07 NOTE — Assessment & Plan Note (Signed)
He felt better off metoprolol.  Continue hydrochlorothiazide as is.

## 2023-07-07 NOTE — Assessment & Plan Note (Signed)
Continue work on diet and exercise.  Continue Lipitor.

## 2023-07-10 DIAGNOSIS — Z0100 Encounter for examination of eyes and vision without abnormal findings: Secondary | ICD-10-CM | POA: Diagnosis not present

## 2023-07-18 ENCOUNTER — Other Ambulatory Visit: Payer: Self-pay | Admitting: Cardiology

## 2023-07-18 DIAGNOSIS — I480 Paroxysmal atrial fibrillation: Secondary | ICD-10-CM

## 2023-07-18 NOTE — Telephone Encounter (Signed)
 Prescription refill request for Xarelto received.  Indication: Afib  Last office visit: 02/16/23 Dan Humphreys)  Weight: 96.3kg Age: 78 Scr: 0.96 (06/25/23)  CrCl: 84.60ml/min  Appropriate dose. Refill sent.

## 2023-07-25 ENCOUNTER — Ambulatory Visit (HOSPITAL_COMMUNITY)
Admission: RE | Admit: 2023-07-25 | Discharge: 2023-07-25 | Disposition: A | Discharge status: Home / Self Care | Payer: Medicare Other | Source: Ambulatory Visit | Attending: Cardiovascular Disease | Admitting: Cardiovascular Disease

## 2023-07-25 DIAGNOSIS — R0989 Other specified symptoms and signs involving the circulatory and respiratory systems: Secondary | ICD-10-CM | POA: Diagnosis not present

## 2023-07-26 ENCOUNTER — Telehealth: Payer: Self-pay | Admitting: Family Medicine

## 2023-07-26 NOTE — Telephone Encounter (Signed)
Copied from CRM [id number]. Topic: General - Call Back - No Documentation >> Jul 26, 2023  2:45 PM Samuel Jester B wrote: Reason for CRM: Pt called and stated that he received a call from [facility] and would like to request a callback.

## 2023-07-26 NOTE — Telephone Encounter (Signed)
Returned call to patient actions documented in another note

## 2023-07-26 NOTE — Telephone Encounter (Signed)
Copied from CRM [id number]. Topic: General - Call Back - No Documentation >> Jul 26, 2023 10:11 AM Matthew Sullivan wrote: Reason for CRM: Patient states he missed a call from the clinic but there is nothing in his chart that reflects why this call was made. I advised patient the clinic will call back again when they are available to do so.

## 2023-07-27 ENCOUNTER — Encounter: Payer: Self-pay | Admitting: Family Medicine

## 2023-07-27 LAB — VAS US LOWER EXT ART SEG MULTI (SEGMENTALS & LE RAYNAUDS)
Left ABI: 1.06
Right ABI: 1.13

## 2023-07-27 NOTE — Telephone Encounter (Signed)
Closing encounter spoke with patient and advised of Dr. Armanda Heritage message

## 2023-07-27 NOTE — Telephone Encounter (Signed)
 error

## 2023-08-06 DIAGNOSIS — H40013 Open angle with borderline findings, low risk, bilateral: Secondary | ICD-10-CM | POA: Diagnosis not present

## 2023-08-22 DIAGNOSIS — R42 Dizziness and giddiness: Secondary | ICD-10-CM | POA: Diagnosis not present

## 2023-08-22 DIAGNOSIS — H903 Sensorineural hearing loss, bilateral: Secondary | ICD-10-CM | POA: Diagnosis not present

## 2023-09-11 ENCOUNTER — Encounter: Payer: Self-pay | Admitting: Family Medicine

## 2023-09-11 NOTE — Telephone Encounter (Signed)
 error

## 2023-09-13 ENCOUNTER — Other Ambulatory Visit: Payer: Self-pay | Admitting: Cardiovascular Disease

## 2023-10-15 ENCOUNTER — Other Ambulatory Visit (HOSPITAL_BASED_OUTPATIENT_CLINIC_OR_DEPARTMENT_OTHER): Payer: Self-pay | Admitting: Cardiovascular Disease

## 2023-11-07 DIAGNOSIS — L821 Other seborrheic keratosis: Secondary | ICD-10-CM | POA: Diagnosis not present

## 2023-11-07 DIAGNOSIS — Z08 Encounter for follow-up examination after completed treatment for malignant neoplasm: Secondary | ICD-10-CM | POA: Diagnosis not present

## 2023-11-07 DIAGNOSIS — D225 Melanocytic nevi of trunk: Secondary | ICD-10-CM | POA: Diagnosis not present

## 2023-11-07 DIAGNOSIS — Z85828 Personal history of other malignant neoplasm of skin: Secondary | ICD-10-CM | POA: Diagnosis not present

## 2023-11-07 DIAGNOSIS — L814 Other melanin hyperpigmentation: Secondary | ICD-10-CM | POA: Diagnosis not present

## 2023-11-07 DIAGNOSIS — Z85820 Personal history of malignant melanoma of skin: Secondary | ICD-10-CM | POA: Diagnosis not present

## 2023-11-07 DIAGNOSIS — L570 Actinic keratosis: Secondary | ICD-10-CM | POA: Diagnosis not present

## 2023-11-15 DIAGNOSIS — H401121 Primary open-angle glaucoma, left eye, mild stage: Secondary | ICD-10-CM | POA: Diagnosis not present

## 2023-11-27 ENCOUNTER — Encounter

## 2023-11-28 ENCOUNTER — Telehealth: Payer: Self-pay | Admitting: Family Medicine

## 2023-11-28 NOTE — Telephone Encounter (Signed)
 Copied from CRM [id number]. Topic: General - Other >> Nov 27, 2023  5:01 PM Dewanda Foots wrote: Reason for CRM: Pt is returning phone call to [facility]. States that he was unable to make it to his appt today because his father in law was passing away and he tried to tell them that prior to the appt. Wanted her to know why he did not make it.    Pt is still at the hospital with them now [phone] but may not have reception. Just wanted you to know why.

## 2023-12-17 DIAGNOSIS — H401121 Primary open-angle glaucoma, left eye, mild stage: Secondary | ICD-10-CM | POA: Diagnosis not present

## 2024-01-01 DIAGNOSIS — H401121 Primary open-angle glaucoma, left eye, mild stage: Secondary | ICD-10-CM | POA: Diagnosis not present

## 2024-01-16 ENCOUNTER — Other Ambulatory Visit: Payer: Self-pay | Admitting: Cardiovascular Disease

## 2024-01-16 DIAGNOSIS — I480 Paroxysmal atrial fibrillation: Secondary | ICD-10-CM

## 2024-01-16 NOTE — Telephone Encounter (Signed)
 Prescription refill request for Xarelto  received.  Indication: PAF Last office visit: 02/16/23  JAYSON Finder NP Weight: 100.2kg Age: 78 Scr: 0.96 on 06/25/23 CrCl: 75.16  Based on above findings Xarelto  20mg  daily is the appropriate dose.  Refill approved.

## 2024-01-29 ENCOUNTER — Other Ambulatory Visit: Payer: Self-pay | Admitting: Family

## 2024-04-02 DIAGNOSIS — H401121 Primary open-angle glaucoma, left eye, mild stage: Secondary | ICD-10-CM | POA: Diagnosis not present

## 2024-04-15 ENCOUNTER — Ambulatory Visit (INDEPENDENT_AMBULATORY_CARE_PROVIDER_SITE_OTHER)

## 2024-04-15 VITALS — BP 128/72 | Ht 71.5 in | Wt 206.0 lb

## 2024-04-15 DIAGNOSIS — Z2821 Immunization not carried out because of patient refusal: Secondary | ICD-10-CM | POA: Diagnosis not present

## 2024-04-15 DIAGNOSIS — Z0000 Encounter for general adult medical examination without abnormal findings: Secondary | ICD-10-CM

## 2024-04-15 NOTE — Patient Instructions (Signed)
 [name],  Thank you for taking the time for your Medicare Wellness Visit. I appreciate your continued commitment to your health goals. Please review the care plan we discussed, and feel free to reach out if I can assist you further.  Medicare recommends these wellness visits once per year to help you and your care team stay ahead of potential health issues. These visits are designed to focus on prevention, allowing your provider to concentrate on managing your acute and chronic conditions during your regular appointments.  Please note that Annual Wellness Visits do not include a physical exam. Some assessments may be limited, especially if the visit was conducted virtually. If needed, we may recommend a separate in-person follow-up with your provider.  Ongoing Care Seeing your primary care provider every 3 to 6 months helps us  monitor your health and provide consistent, personalized care.   Referrals If a referral was made during today's visit and you haven't received any updates within two weeks, please contact the referred provider directly to check on the status.  Recommended Screenings:  Health Maintenance  Topic Date Due   COVID-19 Vaccine (1) Never done   Hepatitis C Screening  Never done   DTaP/Tdap/Td vaccine (1 - Tdap) Never done   Zoster (Shingles) Vaccine (1 of 2) Never done   Flu Shot  02/08/2024   Medicare Annual Wellness Visit  04/15/2025   Pneumococcal Vaccine for age over 43  Completed   Meningitis B Vaccine  Aged Out       04/15/2024    8:51 AM  Advanced Directives  Does Patient Have a Medical Advance Directive? Yes  Type of Estate agent of [place];Living will  Does patient want to make changes to medical advance directive? No - Patient declined  Copy of Healthcare Power of Attorney in Chart? Yes - validated most recent copy scanned in chart (See row information)   Advance Care Planning is important because it: Ensures you receive medical  care that aligns with your values, goals, and preferences. Provides guidance to your family and loved ones, reducing the emotional burden of decision-making during critical moments.  Vision: Annual vision screenings are recommended for early detection of glaucoma, cataracts, and diabetic retinopathy. These exams can also reveal signs of chronic conditions such as diabetes and high blood pressure.  Dental: Annual dental screenings help detect early signs of oral cancer, gum disease, and other conditions linked to overall health, including heart disease and diabetes.  Please see the attached documents for additional preventive care recommendations.

## 2024-04-15 NOTE — Progress Notes (Signed)
 Because this visit was a virtual/telehealth visit,  certain criteria was not obtained, such a blood pressure, CBG if applicable, and timed get up and go. Any medications not marked as taking were not mentioned during the medication reconciliation part of the visit. Any vitals not documented were not able to be obtained due to this being a telehealth visit or patient was unable to self-report a recent blood pressure reading due to a lack of equipment at home via telehealth. Vitals that have been documented are verbally provided by the patient.  This visit was performed by a medical professional under my direct supervision. I was immediately available for consultation/collaboration. I have reviewed and agree with the Annual Wellness Visit documentation.  Subjective:   Matthew Sullivan is a 78 y.o. who presents for a Medicare Wellness preventive visit.  As a reminder, Annual Wellness Visits don't include a physical exam, and some assessments may be limited, especially if this visit is performed virtually. We may recommend an in-person follow-up visit with your provider if needed.  Visit Complete: Virtual I connected with  Matthew Sullivan on 04/15/24 by a audio enabled telemedicine application and verified that I am speaking with the correct person using two identifiers.  Patient Location: Home  Provider Location: Home Office  I discussed the limitations of evaluation and management by telemedicine. The patient expressed understanding and agreed to proceed.  Vital Signs: Because this visit was a virtual/telehealth visit, some criteria may be missing or patient reported. Any vitals not documented were not able to be obtained and vitals that have been documented are patient reported.  VideoDeclined- This patient declined Librarian, academic. Therefore the visit was completed with audio only.  Persons Participating in Visit: Patient.  AWV Questionnaire: No: Patient  Medicare AWV questionnaire was not completed prior to this visit.  Cardiac Risk Factors include: advanced age (>54men, >50 women);male gender;hypertension;dyslipidemia;Other (see comment), Risk factor comments: a fib     Objective:    Today's Vitals   04/15/24 0848  BP: 128/72  Weight: 206 lb (93.4 kg)  Height: 5' 11.5 (1.816 m)   Body mass index is 28.33 kg/m.     04/15/2024    8:51 AM 02/01/2023    6:10 PM 11/13/2022    9:38 AM 09/19/2022   11:49 AM 04/12/2022    9:40 AM 03/20/2022   12:16 PM 11/09/2021   10:00 AM  Advanced Directives  Does Patient Have a Medical Advance Directive? Yes No No No No No No  Type of Estate agent of [place];Living will        Does patient want to make changes to medical advance directive? No - Patient declined        Copy of Healthcare Power of Attorney in Chart? Yes - validated most recent copy scanned in chart (See row information)        Would patient like information on creating a medical advance directive?  No - Patient declined No - Patient declined No - Patient declined No - Patient declined No - Patient declined No - Patient declined    Current Medications (verified) Outpatient Encounter Medications as of 04/15/2024  Medication Sig   acetaminophen  (TYLENOL ) 500 MG tablet Take 1,000 mg by mouth every 6 (six) hours as needed for moderate pain.   atorvastatin  (LIPITOR) 80 MG tablet TAKE 1/2 TABLET (40 MG TOTAL) BY MOUTH DAILY.   Fluticasone  Propionate (ALLERGY RELIEF NA) Place 1 spray into the nose at bedtime as needed (  allergies).   hydrochlorothiazide  (HYDRODIURIL ) 25 MG tablet TAKE 1 TABLET (25 MG TOTAL) BY MOUTH DAILY.   levothyroxine  (SYNTHROID ) 25 MCG tablet Take 1 tablet (25 mcg total) by mouth daily.   Menthol-Methyl Salicylate (MUSCLE RUB) 10-15 % CREA Apply 1 Application topically daily as needed for muscle pain.   Multiple Vitamin (MULTIVITAMIN WITH MINERALS) TABS tablet Take 1 tablet by mouth daily.   potassium  chloride SA (KLOR-CON  M) 20 MEQ tablet TAKE 1 TABLET BY MOUTH IN THE MORNING AND 2 TABLETS AT BEDTIME   Saw Palmetto 450 MG CAPS Take 450 mg by mouth 2 (two) times daily.   XARELTO  20 MG TABS tablet TAKE 1 TABLET BY MOUTH DAILY WITH SUPPER.   No facility-administered encounter medications on file as of 04/15/2024.    Allergies (verified) Patient has no known allergies.   History: Past Medical History:  Diagnosis Date   Ascending aortic aneurysm 09/02/2021   Atrial fibrillation, currently in sinus rhythm    Atrial myxoma    Cervical osteoarthritis    History of TIA (transient ischemic attack)    History of tinnitus    Hyperlipidemia    Hypertension    Melanoma (HCC)    per Dr. Ivin, local excision, no chemo/no rady tx.    Past Surgical History:  Procedure Laterality Date   ANKLE ARTHROSCOPY WITH ARTHRODESIS Left 08/26/2015   Procedure: LEFT ANKLE ARTHROSCOPY TIBIOTALAR ARTHRODESIS;  Surgeon: Toribio JULIANNA Chancy, MD;  Location: [facility];  Service: Orthopedics;  Laterality: Left;   APPENDECTOMY     ATRIAL FIBRILLATION ABLATION N/A 09/19/2022   Procedure: ATRIAL FIBRILLATION ABLATION;  Surgeon: Inocencio Soyla Lunger, MD;  Location: [facility] INVASIVE CV LAB;  Service: Cardiovascular;  Laterality: N/A;   BACK SURGERY     CARDIOVERSION N/A 06/25/2013   Procedure: CARDIOVERSION;  Surgeon: Debby Gallop, MD;  Location[facility] ENDOSCOPY;  Service: Cardiovascular;  Laterality: N/A;   CARDIOVERSION N/A 06/23/2021   Procedure: CARDIOVERSION;  Surgeon: Alveta Aleene PARAS, MD;  Location: [facility] ENDOSCOPY;  Service: Cardiovascular;  Laterality: N/A;   CARDIOVERSION N/A 03/20/2022   Procedure: CARDIOVERSION;  Surgeon: Raford Riggs, MD;  Location: [facility] ENDOSCOPY;  Service: Cardiovascular;  Laterality: N/A;   CARDIOVERSION N/A 04/12/2022   Procedure: CARDIOVERSION;  Surgeon: Loni Soyla LABOR, MD;  Location: [facility] ENDOSCOPY;  Service: Cardiovascular;  Laterality: N/A;   CARDIOVERSION N/A  10/30/2022   Procedure: CARDIOVERSION;  Surgeon: Alvan Ronal BRAVO, MD;  Location: [facility] INVASIVE CV LAB;  Service: Cardiovascular;  Laterality: N/A;   CARDIOVERSION  10/30/2022   EXCISION OF ATRIAL MYXOMA Left 04/09/2013   Procedure: EXCISION OF ATRIAL MYXOMA;  Surgeon: Elspeth JAYSON Millers, MD;  Location: [facility] OR;  Service: Open Heart Surgery;  Laterality: Left;   INTRAOPERATIVE TRANSESOPHAGEAL ECHOCARDIOGRAM N/A 04/09/2013   Procedure: INTRAOPERATIVE TRANSESOPHAGEAL ECHOCARDIOGRAM;  Surgeon: Elspeth JAYSON Millers, MD;  Location: [facility] OR;  Service: Open Heart Surgery;  Laterality: N/A;   LEFT HEART CATHETERIZATION WITH CORONARY ANGIOGRAM N/A 04/07/2013   Procedure: LEFT HEART CATHETERIZATION WITH CORONARY ANGIOGRAM;  Surgeon: Ozell JONETTA Fell, MD;  Location: [facility] CATH LAB;  Service: Cardiovascular;  Laterality: N/A;   NASAL SEPTUM SURGERY     NOSE SURGERY     ROTATOR CUFF REPAIR     both shoulders   Family History  Problem Relation Age of Onset   Colon cancer Mother    Liver cancer Mother    Cancer Father    Brain cancer Father        glioblastoma   Prostate  cancer Neg Hx    Social History   Socioeconomic History   Marital status: Married    Spouse name: Not on file   Number of children: Not on file   Years of education: Not on file   Highest education level: Not on file  Occupational History   Not on file  Tobacco Use   Smoking status: Former    Current packs/day: 0.00    Types: Cigarettes    Quit date: 07/10/1977    Years since quitting: 46.7   Smokeless tobacco: Never   Tobacco comments:    Former smoker 04/20/22  Vaping Use   Vaping status: Never Used  Substance and Sexual Activity   Alcohol use: Yes    Comment: rare   Drug use: No   Sexual activity: Not Currently  Other Topics Concern   Not on file  Social History Narrative   Married Sales promotion account executive   Social Drivers of Health   Financial Resource Strain: Low Risk  (04/15/2024)   Overall Financial Resource Strain  (CARDIA)    Difficulty of Paying Living Expenses: Not hard at all  Food Insecurity: No Food Insecurity (04/15/2024)   Hunger Vital Sign    Worried About Running Out of Food in the Last Year: Never true    Ran Out of Food in the Last Year: Never true  Transportation Needs: No Transportation Needs (04/15/2024)   PRAPARE - Administrator, Civil Service (Medical): No    Lack of Transportation (Non-Medical): No  Physical Activity: Sufficiently Active (04/15/2024)   Exercise Vital Sign    Days of Exercise per Week: 7 days    Minutes of Exercise per Session: 30 min  Stress: No Stress Concern Present (04/15/2024)   [employer] of Occupational Health - Occupational Stress Questionnaire    Feeling of Stress: Not at all  Social Connections: Moderately Integrated (04/15/2024)   Social Connection and Isolation Panel    Frequency of Communication with Friends and Family: More than three times a week    Frequency of Social Gatherings with Friends and Family: More than three times a week    Attends Religious Services: More than 4 times per year    Active Member of [employer] or Organizations: No    Attends Banker Meetings: Never    Marital Status: Married    Tobacco Counseling Counseling given: Not Answered Tobacco comments: Former smoker 04/20/22    Clinical Intake:  Pre-visit preparation completed: Yes  Pain : No/denies pain     BMI - recorded: 28.33 Nutritional Status: BMI 25 -29 Overweight Nutritional Risks: None Diabetes: No  Lab Results  Component Value Date   HGBA1C 5.6 04/09/2013     How often do you need to have someone help you when you read instructions, pamphlets, or other written materials from your doctor or pharmacy?: 1 - Never  Interpreter Needed?: No  Information entered by :: Kaladin Noseworthy,CMA   Activities of Daily Living     04/15/2024    8:50 AM  In your present state of health, do you have any difficulty performing the  following activities:  Hearing? 0  Vision? 0  Difficulty concentrating or making decisions? 0  Walking or climbing stairs? 0  Dressing or bathing? 0  Doing errands, shopping? 0  Preparing Food and eating ? N  Using the Toilet? N  In the past six months, have you accidently leaked urine? N  Do you have problems with loss of bowel  control? N  Managing your Medications? N  Managing your Finances? N  Housekeeping or managing your Housekeeping? N    Patient Care Team: Cleatus Arlyss RAMAN, MD as PCP - General (Family Medicine) Nahser, Aleene PARAS, MD (Inactive) as PCP - Cardiology (Cardiology) Inocencio Soyla Lunger, MD as PCP - Electrophysiology (Cardiology)  I have updated your Care Teams any recent Medical Services you may have received from other providers in the past year.     Assessment:   This is a routine wellness examination for [place].  Hearing/Vision screen Hearing Screening - Comments:: No difficulties hearing Vision Screening - Comments:: Patient wears glasses   Goals Addressed             This Visit's Progress    Exercise 3x per week (30 min per time)   On track    Continue to stay healthy and exercise.        Depression Screen     04/15/2024    8:52 AM 07/02/2023    8:17 AM 11/13/2022    9:37 AM 11/09/2021    9:58 AM 11/05/2020    8:08 AM 07/20/2017   10:13 AM 11/13/2016   12:06 PM  PHQ 2/9 Scores  PHQ - 2 Score 0 0 0 0 0 0 0  PHQ- 9 Score 0 0         Fall Risk     04/15/2024    8:51 AM 07/02/2023    8:16 AM 11/13/2022    9:32 AM 11/09/2021   10:00 AM 11/05/2020    8:08 AM  Fall Risk   Falls in the past year? 0 0 0 0 0  Number falls in past yr: 0 0 0 0 0  Injury with Fall? 0 0 0 0 0  Risk for fall due to : No Fall Risks No Fall Risks No Fall Risks No Fall Risks   Follow up Falls evaluation completed Falls evaluation completed Falls prevention discussed;Falls evaluation completed;Education provided Falls prevention discussed  Falls evaluation completed       Data saved with a previous flowsheet row definition    MEDICARE RISK AT HOME:  Medicare Risk at Home Any stairs in or around the home?: Yes If so, are there any without handrails?: No Home free of loose throw rugs in walkways, pet beds, electrical cords, etc?: Yes Adequate lighting in your home to reduce risk of falls?: Yes Life alert?: No Use of a cane, walker or w/c?: No Grab bars in the bathroom?: Yes Shower chair or bench in shower?: Yes Elevated toilet seat or a handicapped toilet?: Yes  TIMED UP AND GO:  Was the test performed?  No  Cognitive Function: 6CIT completed        04/15/2024    8:50 AM 11/13/2022    9:39 AM 11/09/2021   10:02 AM  6CIT Screen  What Year? 0 points 0 points 0 points  What month? 0 points 0 points 0 points  What time? 0 points 0 points 0 points  Count back from 20 0 points 0 points 0 points  Months in reverse 0 points 0 points 0 points  Repeat phrase 0 points 0 points 0 points  Total Score 0 points 0 points 0 points    Immunizations Immunization History  Administered Date(s) Administered   Fluad Quad(high Dose 65+) 06/10/2021   Fluad Trivalent(High Dose 65+) 07/02/2023   INFLUENZA, HIGH DOSE SEASONAL PF 07/16/2018   Influenza,inj,Quad PF,6+ Mos 07/19/2016, 07/20/2017   Influenza-Unspecified 07/20/2018  Pneumococcal Conjugate-13 07/20/2017   Pneumococcal Polysaccharide-23 11/05/2020    Screening Tests Health Maintenance  Topic Date Due   COVID-19 Vaccine (1) Never done   Hepatitis C Screening  Never done   DTaP/Tdap/Td (1 - Tdap) Never done   Zoster Vaccines- Shingrix (1 of 2) Never done   Influenza Vaccine  02/08/2024   Medicare Annual Wellness (AWV)  04/15/2025   Pneumococcal Vaccine: 50+ Years  Completed   Meningococcal B Vaccine  Aged Out    Health Maintenance Items Addressed:patient declined  Additional Screening:  Vision Screening: Recommended annual ophthalmology exams for early detection of glaucoma and other disorders  of the eye. Is the patient up to date with their annual eye exam?  No    Dental Screening: Recommended annual dental exams for proper oral hygiene  Community Resource Referral / Chronic Care Management: CRR required this visit?  No   CCM required this visit?  No   Plan:    I have personally reviewed and noted the following in the patient's chart:   Medical and social history Use of alcohol, tobacco or illicit drugs  Current medications and supplements including opioid prescriptions. Patient is not currently taking opioid prescriptions. Functional ability and status Nutritional status Physical activity Advanced directives List of other physicians Hospitalizations, surgeries, and ER visits in previous 12 months Vitals Screenings to include cognitive, depression, and falls Referrals and appointments  In addition, I have reviewed and discussed with patient certain preventive protocols, quality metrics, and best practice recommendations. A written personalized care plan for preventive services as well as general preventive health recommendations were provided to patient.   Lyle MARLA Right, [place]   04/15/2024   After Visit Summary: (MyChart) Due to this being a telephonic visit, the after visit summary with patients personalized plan was offered to patient via MyChart   Notes: Nothing significant to report at this time.

## 2024-04-29 ENCOUNTER — Other Ambulatory Visit: Payer: Self-pay | Admitting: Cardiovascular Disease

## 2024-05-27 ENCOUNTER — Encounter: Payer: Self-pay | Admitting: Family Medicine

## 2024-05-27 ENCOUNTER — Ambulatory Visit (INDEPENDENT_AMBULATORY_CARE_PROVIDER_SITE_OTHER): Admitting: Family Medicine

## 2024-05-27 VITALS — BP 122/78 | HR 57 | Temp 97.6°F | Ht 71.5 in | Wt 205.0 lb

## 2024-05-27 DIAGNOSIS — E782 Mixed hyperlipidemia: Secondary | ICD-10-CM | POA: Diagnosis not present

## 2024-05-27 DIAGNOSIS — I480 Paroxysmal atrial fibrillation: Secondary | ICD-10-CM | POA: Diagnosis not present

## 2024-05-27 DIAGNOSIS — E039 Hypothyroidism, unspecified: Secondary | ICD-10-CM | POA: Diagnosis not present

## 2024-05-27 DIAGNOSIS — I10 Essential (primary) hypertension: Secondary | ICD-10-CM | POA: Diagnosis not present

## 2024-05-27 DIAGNOSIS — Z7189 Other specified counseling: Secondary | ICD-10-CM

## 2024-05-27 DIAGNOSIS — Z23 Encounter for immunization: Secondary | ICD-10-CM | POA: Diagnosis not present

## 2024-05-27 DIAGNOSIS — I7121 Aneurysm of the ascending aorta, without rupture: Secondary | ICD-10-CM

## 2024-05-27 DIAGNOSIS — Z0000 Encounter for general adult medical examination without abnormal findings: Secondary | ICD-10-CM

## 2024-05-27 DIAGNOSIS — R0989 Other specified symptoms and signs involving the circulatory and respiratory systems: Secondary | ICD-10-CM

## 2024-05-27 LAB — COMPREHENSIVE METABOLIC PANEL WITH GFR
ALT: 27 U/L (ref 0–53)
AST: 20 U/L (ref 0–37)
Albumin: 4.5 g/dL (ref 3.5–5.2)
Alkaline Phosphatase: 67 U/L (ref 39–117)
BUN: 19 mg/dL (ref 6–23)
CO2: 31 meq/L (ref 19–32)
Calcium: 9 mg/dL (ref 8.4–10.5)
Chloride: 101 meq/L (ref 96–112)
Creatinine, Ser: 0.93 mg/dL (ref 0.40–1.50)
GFR: 77.85 mL/min (ref >60.00)
Glucose, Bld: 108 mg/dL — ABNORMAL HIGH (ref 70–99)
Potassium: 4.1 meq/L (ref 3.5–5.1)
Sodium: 139 meq/L (ref 135–145)
Total Bilirubin: 1 mg/dL (ref 0.2–1.2)
Total Protein: 6.8 g/dL (ref 6.0–8.3)

## 2024-05-27 LAB — LIPID PANEL
Cholesterol: 137 mg/dL (ref 0–200)
HDL: 47.9 mg/dL (ref >39.00)
LDL Cholesterol: 75 mg/dL (ref 0–99)
NonHDL: 88.72
Total CHOL/HDL Ratio: 3
Triglycerides: 70 mg/dL (ref 0.0–149.0)
VLDL: 14 mg/dL (ref 0.0–40.0)

## 2024-05-27 LAB — CBC WITH DIFFERENTIAL/PLATELET
Basophils Absolute: 0 K/uL (ref 0.0–0.1)
Basophils Relative: 0.5 % (ref 0.0–3.0)
Eosinophils Absolute: 0.1 K/uL (ref 0.0–0.7)
Eosinophils Relative: 1.5 % (ref 0.0–5.0)
HCT: 42 % (ref 39.0–52.0)
Hemoglobin: 14.4 g/dL (ref 13.0–17.0)
Lymphocytes Relative: 25.6 % (ref 12.0–46.0)
Lymphs Abs: 1.2 K/uL (ref 0.7–4.0)
MCHC: 34.2 g/dL (ref 30.0–36.0)
MCV: 91.7 fl (ref 78.0–100.0)
Monocytes Absolute: 0.4 K/uL (ref 0.1–1.0)
Monocytes Relative: 9.2 % (ref 3.0–12.0)
Neutro Abs: 3 K/uL (ref 1.4–7.7)
Neutrophils Relative %: 63.2 % (ref 43.0–77.0)
Platelets: 165 K/uL (ref 150.0–400.0)
RBC: 4.58 Mil/uL (ref 4.22–5.81)
RDW: 13.5 % (ref 11.5–15.5)
WBC: 4.8 K/uL (ref 4.0–10.5)

## 2024-05-27 LAB — TSH: TSH: 3.66 u[IU]/mL (ref 0.35–5.50)

## 2024-05-27 MED ORDER — LEVOTHYROXINE SODIUM 25 MCG PO TABS
25.0000 ug | ORAL_TABLET | Freq: Every day | ORAL | 3 refills | Status: AC
Start: 2024-05-27 — End: ?

## 2024-05-27 NOTE — Progress Notes (Unsigned)
 Hypothyroid.  On replacement.  Labs pending.  Compliant. No neck mass.     Elevated Cholesterol: Using medications without problems: yes Muscle aches: rare cramping after prolonged work, not during exertion.   Diet compliance: yes Exercise: yes   Hypertension:               Using medication without problems or lightheadedness: yes Chest pain with exertion:no Edema:no Short of breath:no Cramping improved with taking potassium.     H/o AF.  Still on anticoagulation.  No bleeding.  No blood in stool, etc.  Labs pending.  He is back in AF.  He could feel the return of AF.     H/o melanoma followed derm, has routine f/u.     Ascending aorta is mildly dilated, followed by cards.  I will defer.  He agrees.  D/w pt about seeing f/u set, see AVS.     He doesn't have claudication.  See foot exam below. He can walk > 1 miles w/o cramping.     Flu 2025 Shingles discussed with patient. PNA 2022 RSV d/w pt.   Tetanus discussed with patient.  May be cheaper pharmacy COVID-vaccine prev encouraged. Cologuard neg 2022, d/w pt- reasonable not to screen at current age 78.   Prostate cancer screening and PSA options (with potential risks and benefits of testing vs not testing) were discussed along with recent recs/guidelines.  He declined testing PSA at this point. Advance directive-wife and son equally designated if patient were incapacitated.   Meds, vitals, and allergies reviewed.    ROS: Per HPI unless specifically indicated in ROS section    GEN: nad, alert and oriented HEENT: mucous membranes moist NECK: supple w/o LA, no TMG CV: IRR PULM: ctab, no inc wob ABD: soft, +bs EXT: no edema SKIN: well perfused.  Absent L DP and PT pulse.  Normal R DP pulse.   Normal radial pulses.

## 2024-05-27 NOTE — Patient Instructions (Addendum)
 Please call about seeing Dr. Inocencio.  [facility] at [facility] [address] [place], [place] [address] [phone]  Check with the pharmacy about a Tdap.   Flu shot today.    Go to the lab on the way out.   If you have mychart we'll likely use that to update you.    Take care.  Glad to see you.

## 2024-05-28 NOTE — Assessment & Plan Note (Signed)
 Ascending aorta is mildly dilated, followed by cards.  I will defer.  He agrees.  D/w pt about seeing f/u set, see AVS.

## 2024-05-28 NOTE — Assessment & Plan Note (Signed)
See notes on labs.  Continue atorvastatin. 

## 2024-05-28 NOTE — Assessment & Plan Note (Signed)
 Flu 2025 Shingles discussed with patient. PNA 2022 RSV d/w pt.   Tetanus discussed with patient.  May be cheaper pharmacy COVID-vaccine prev encouraged. Cologuard neg 2022, d/w pt- reasonable not to screen at current age 78.   Prostate cancer screening and PSA options (with potential risks and benefits of testing vs not testing) were discussed along with recent recs/guidelines.  He declined testing PSA at this point. Advance directive-wife and son equally designated if patient were incapacitated.

## 2024-05-28 NOTE — Assessment & Plan Note (Signed)
 He can walk > 1 mile w/o cramping.   Exam appears stable.  Routine cautions given to patient.

## 2024-05-28 NOTE — Assessment & Plan Note (Signed)
 Continue potassium along with hydrochlorothiazide .  See notes on labs.

## 2024-05-28 NOTE — Assessment & Plan Note (Signed)
 Continue anticoagulation with Xarelto .  Discussed with patient about cardiology follow-up.  I will send a note to cardiology.  He can also call about an appointment.

## 2024-05-28 NOTE — Assessment & Plan Note (Signed)
 Continue on replacement.  Labs pending.  Compliant. No neck mass.

## 2024-05-28 NOTE — Assessment & Plan Note (Signed)
Advance directive- wife and son equally designated if patient were incapacitated.

## 2024-06-01 ENCOUNTER — Ambulatory Visit: Payer: Self-pay | Admitting: Family Medicine

## 2024-06-11 ENCOUNTER — Ambulatory Visit (HOSPITAL_COMMUNITY)
Admission: RE | Admit: 2024-06-11 | Discharge: 2024-06-11 | Disposition: A | Source: Ambulatory Visit | Attending: Physician Assistant | Admitting: Physician Assistant

## 2024-06-11 VITALS — BP 142/76 | HR 95 | Ht 71.5 in | Wt 209.6 lb

## 2024-06-11 DIAGNOSIS — I4819 Other persistent atrial fibrillation: Secondary | ICD-10-CM

## 2024-06-11 DIAGNOSIS — D6869 Other thrombophilia: Secondary | ICD-10-CM

## 2024-06-11 DIAGNOSIS — I4891 Unspecified atrial fibrillation: Secondary | ICD-10-CM

## 2024-06-11 NOTE — Patient Instructions (Signed)
 Cardioversion scheduled for: Friday December 5th   - Arrive at the [facility] ([address])  and check in with ADMITTING at 10:00 AM   - Do not eat or drink anything after midnight the night prior to your procedure.   - Take all your morning medication (except diabetic medications) with a sip of water prior to arrival.  - Do NOT miss any doses of your blood thinner - if you should miss a dose or take a dose more than 4 hours late -- please notify our office immediately.  - You will not be able to drive home after your procedure. Please ensure you have a responsible adult to drive you home. You will need someone with you for 24 hours post procedure.     - Expect to be in the procedural area approximately 2 hours.   - If you feel as if you go back into normal rhythm prior to scheduled cardioversion, please notify our office immediately.   If your procedure is canceled in the cardioversion suite you will be charged a cancellation fee.

## 2024-06-11 NOTE — H&P (View-Only) (Signed)
 Primary Care Physician: Cleatus Arlyss RAMAN, MD Primary Cardiologist: Dr Raford  Primary Electrophysiologist: Dr Inocencio Referring Physician: Dr Raford Matthew Sullivan is a 78 y.o. male with a history of HTN, HLD, atrial myxoma s/p excision 2013, TIA, atrial fibrillation who presents for follow up in the [facility].  He had postoperative atrial fibrillation and had a cardioversion in 2014.  He was started on amiodarone  which was subsequently discontinued. Patient is on Xarelto  for a CHADS2VASC score of 5. Patient underwent DCCV on 03/20/22 but returned to afib before leaving the hospital. Patient is s/p DCCV on 04/12/22 which lasted at most 48 hours. Seen by Dr Inocencio and underwent afib ablation on 09/19/22.  Patient had R groin site oozing after leaving the hospital. He had been changing his dressing multiple times and his son who is an EMT held pressure twice for 20 minutes without improvement. He came into the office 09/22/22 and was injected with lidocaine  with epi which resolved the oozing. He was found to be in persistent afib again on follow up 10/17/22 and underwent DCCV on 10/30/22.  Patient returns for follow up for atrial fibrillation. He is in afib today with symptoms of fatigue and intermittent lightheadedness. The symptoms began on 05/20/24. There were no specific triggers that he could identify. No bleeding issues on anticoagulation.   Today, he  denies symptoms of palpitations, chest pain, shortness of breath, orthopnea, PND, lower extremity edema, dizziness, presyncope, syncope, snoring, daytime somnolence, bleeding, or neurologic sequela. The patient is tolerating medications without difficulties and is otherwise without complaint today.    Atrial Fibrillation Risk Factors:  he does not have symptoms or diagnosis of sleep apnea. he does not have a history of rheumatic fever.  Atrial Fibrillation Management history:  Previous antiarrhythmic drugs:  amiodarone , flecainide   Previous cardioversions: 2014, 06/23/21, 03/20/22, 04/12/22, 10/30/22 Previous ablations: 09/19/22 Anticoagulation history: Xarelto     Past Medical History:  Diagnosis Date   Ascending aortic aneurysm 09/02/2021   Atrial fibrillation, currently in sinus rhythm    Atrial myxoma    Cervical osteoarthritis    History of TIA (transient ischemic attack)    History of tinnitus    Hyperlipidemia    Hypertension    Melanoma (HCC)    per Dr. Ivin, local excision, no chemo/no rady tx.     Current Outpatient Medications  Medication Sig Dispense Refill   acetaminophen  (TYLENOL ) 500 MG tablet Take 1,000 mg by mouth every 6 (six) hours as needed for moderate pain. (Patient taking differently: Take 1,000 mg by mouth at bedtime.)     atorvastatin  (LIPITOR) 80 MG tablet TAKE 1/2 TABLET (40 MG TOTAL) BY MOUTH DAILY. 45 tablet 1   Fluticasone  Propionate (ALLERGY RELIEF NA) Place 1 spray into the nose at bedtime as needed (allergies).     hydrochlorothiazide  (HYDRODIURIL ) 25 MG tablet TAKE 1 TABLET (25 MG TOTAL) BY MOUTH DAILY. 90 tablet 2   levothyroxine  (SYNTHROID ) 25 MCG tablet Take 1 tablet (25 mcg total) by mouth daily. 90 tablet 3   Menthol-Methyl Salicylate (MUSCLE RUB) 10-15 % CREA Apply 1 Application topically daily as needed for muscle pain.     Multiple Vitamin (MULTIVITAMIN WITH MINERALS) TABS tablet Take 1 tablet by mouth daily.     potassium chloride  SA (KLOR-CON  M) 20 MEQ tablet TAKE 1 TABLET BY MOUTH IN THE MORNING AND 2 TABLETS AT BEDTIME 135 tablet 0   Saw Palmetto 450 MG CAPS Take 450 mg by mouth  2 (two) times daily.     XARELTO  20 MG TABS tablet TAKE 1 TABLET BY MOUTH DAILY WITH SUPPER. 30 tablet 5   No current facility-administered medications for this encounter.    ROS- All systems are reviewed and negative except as per the HPI above.  Physical Exam: Vitals:   06/11/24 0826  BP: (!) 142/76  Pulse: 95  Weight: 95.1 kg  Height: 5' 11.5 (1.816 m)     GEN: Well nourished, well developed in no acute distress CARDIAC: Irregularly irregular rate and rhythm, no murmurs, rubs, gallops RESPIRATORY:  Clear to auscultation without rales, wheezing or rhonchi  ABDOMEN: Soft, non-tender, non-distended EXTREMITIES:  No edema; No deformity    Wt Readings from Last 3 Encounters:  06/11/24 95.1 kg  05/27/24 93 kg  04/15/24 93.4 kg    EKG Interpretation Date/Time:  Wednesday June 11 2024 08:33:27 EST Ventricular Rate:  95 PR Interval:    QRS Duration:  86 QT Interval:  378 QTC Calculation: 475 R Axis:   70  Text Interpretation: Atrial fibrillation Nonspecific ST abnormality Abnormal ECG When compared with ECG of 16-Feb-2023 14:09, Atrial fibrillation has replaced Sinus rhythm Confirmed by Olean Sangster ([id number]) on 06/11/2024 8:35:51 AM    Echo 06/27/21 demonstrated   1. Left ventricular ejection fraction, by estimation, is 50 to 55%. The  left ventricle has low normal function. The left ventricle demonstrates  global hypokinesis. The left ventricular internal cavity size was mildly  dilated. Left ventricular diastolic parameters are indeterminate.   2. Right ventricular systolic function is normal. The right ventricular  size is normal.   3. Left atrial size was severely dilated.   4. Right atrial size was severely dilated.   5. The mitral valve is grossly normal. Mild mitral valve regurgitation.  No evidence of mitral stenosis.   6. The aortic valve is normal in structure. Aortic valve regurgitation is  not visualized. No aortic stenosis is present.   7. Aortic dilatation noted. There is borderline dilatation of the  ascending aorta, measuring 38 mm.   Comparison(s): EF 50%.   Epic records are reviewed at length today   CHA2DS2-VASc Score = 6  The patient's score is based upon: CHF History: 0 HTN History: 1 Diabetes History: 0 Stroke History: 2 (TIA) Vascular Disease History: 1 Age Score: 2 Gender Score: 0        ASSESSMENT AND PLAN: Persistent Atrial Fibrillation (ICD10:  I48.19) The patient's CHA2DS2-VASc score is 6, indicating a 9.7% annual risk of stroke.   S/p afib ablation 09/19/22 He is back in persistent afib today. We discussed rhythm control options today. Will plan for DCCV. Recent labs reviewed. He is hesitant to pursue repeat ablation given his prior groin bleeding. Can consider dofetilide or amiodarone  if he has quick return of afib.  BB discontinued due to fatigue.  Continue Xarelto  20 mg daily  Secondary Hypercoagulable State (ICD10:  D68.69) The patient is at significant risk for stroke/thromboembolism based upon his CHA2DS2-VASc Score of 6.  Continue Rivaroxaban  (Xarelto ). No bleeding issues.   HTN Stable on current regimen  CAD No anginal symptoms   Follow up in the AF clinic post DCCV.    Informed Consent   Shared Decision Making/Informed Consent The risks (stroke, cardiac arrhythmias rarely resulting in the need for a temporary or permanent pacemaker, skin irritation or burns and complications associated with conscious sedation including aspiration, arrhythmia, respiratory failure and death), benefits (restoration of normal sinus rhythm) and alternatives of a direct current  cardioversion were explained in detail to Mr. Palma and he agrees to proceed.       Daril Kicks PA-C [facility] [address] [place], [place] [address] [phone] 06/11/2024 8:59 AM

## 2024-06-11 NOTE — Progress Notes (Signed)
 Primary Care Physician: Cleatus Arlyss RAMAN, MD Primary Cardiologist: Dr Raford  Primary Electrophysiologist: Dr Inocencio Referring Physician: Dr Raford [name] is a 78 y.o. male with a history of HTN, HLD, atrial myxoma s/p excision 2013, TIA, atrial fibrillation who presents for follow up in the [facility].  He had postoperative atrial fibrillation and had a cardioversion in 2014.  He was started on amiodarone  which was subsequently discontinued. Patient is on Xarelto  for a CHADS2VASC score of 5. Patient underwent DCCV on 03/20/22 but returned to afib before leaving the hospital. Patient is s/p DCCV on 04/12/22 which lasted at most 48 hours. Seen by Dr Inocencio and underwent afib ablation on 09/19/22.  Patient had R groin site oozing after leaving the hospital. He had been changing his dressing multiple times and his son who is an EMT held pressure twice for 20 minutes without improvement. He came into the office 09/22/22 and was injected with lidocaine  with epi which resolved the oozing. He was found to be in persistent afib again on follow up 10/17/22 and underwent DCCV on 10/30/22.  Patient returns for follow up for atrial fibrillation. He is in afib today with symptoms of fatigue and intermittent lightheadedness. The symptoms began on 05/20/24. There were no specific triggers that he could identify. No bleeding issues on anticoagulation.   Today, he  denies symptoms of palpitations, chest pain, shortness of breath, orthopnea, PND, lower extremity edema, dizziness, presyncope, syncope, snoring, daytime somnolence, bleeding, or neurologic sequela. The patient is tolerating medications without difficulties and is otherwise without complaint today.    Atrial Fibrillation Risk Factors:  he does not have symptoms or diagnosis of sleep apnea. he does not have a history of rheumatic fever.  Atrial Fibrillation Management history:  Previous antiarrhythmic drugs:  amiodarone , flecainide   Previous cardioversions: 2014, 06/23/21, 03/20/22, 04/12/22, 10/30/22 Previous ablations: 09/19/22 Anticoagulation history: Xarelto     Past Medical History:  Diagnosis Date   Ascending aortic aneurysm 09/02/2021   Atrial fibrillation, currently in sinus rhythm    Atrial myxoma    Cervical osteoarthritis    History of TIA (transient ischemic attack)    History of tinnitus    Hyperlipidemia    Hypertension    Melanoma (HCC)    per Dr. Ivin, local excision, no chemo/no rady tx.     Current Outpatient Medications  Medication Sig Dispense Refill   acetaminophen  (TYLENOL ) 500 MG tablet Take 1,000 mg by mouth every 6 (six) hours as needed for moderate pain. (Patient taking differently: Take 1,000 mg by mouth at bedtime.)     atorvastatin  (LIPITOR) 80 MG tablet TAKE 1/2 TABLET (40 MG TOTAL) BY MOUTH DAILY. 45 tablet 1   Fluticasone  Propionate (ALLERGY RELIEF NA) Place 1 spray into the nose at bedtime as needed (allergies).     hydrochlorothiazide  (HYDRODIURIL ) 25 MG tablet TAKE 1 TABLET (25 MG TOTAL) BY MOUTH DAILY. 90 tablet 2   levothyroxine  (SYNTHROID ) 25 MCG tablet Take 1 tablet (25 mcg total) by mouth daily. 90 tablet 3   Menthol-Methyl Salicylate (MUSCLE RUB) 10-15 % CREA Apply 1 Application topically daily as needed for muscle pain.     Multiple Vitamin (MULTIVITAMIN WITH MINERALS) TABS tablet Take 1 tablet by mouth daily.     potassium chloride  SA (KLOR-CON  M) 20 MEQ tablet TAKE 1 TABLET BY MOUTH IN THE MORNING AND 2 TABLETS AT BEDTIME 135 tablet 0   Saw Palmetto 450 MG CAPS Take 450 mg by mouth  2 (two) times daily.     XARELTO  20 MG TABS tablet TAKE 1 TABLET BY MOUTH DAILY WITH SUPPER. 30 tablet 5   No current facility-administered medications for this encounter.    ROS- All systems are reviewed and negative except as per the HPI above.  Physical Exam: Vitals:   06/11/24 0826  BP: (!) 142/76  Pulse: 95  Weight: 95.1 kg  Height: 5' 11.5 (1.816 m)     GEN: Well nourished, well developed in no acute distress CARDIAC: Irregularly irregular rate and rhythm, no murmurs, rubs, gallops RESPIRATORY:  Clear to auscultation without rales, wheezing or rhonchi  ABDOMEN: Soft, non-tender, non-distended EXTREMITIES:  No edema; No deformity    Wt Readings from Last 3 Encounters:  06/11/24 95.1 kg  05/27/24 93 kg  04/15/24 93.4 kg    EKG Interpretation Date/Time:  Wednesday June 11 2024 08:33:27 EST Ventricular Rate:  95 PR Interval:    QRS Duration:  86 QT Interval:  378 QTC Calculation: 475 R Axis:   70  Text Interpretation: Atrial fibrillation Nonspecific ST abnormality Abnormal ECG When compared with ECG of 16-Feb-2023 14:09, Atrial fibrillation has replaced Sinus rhythm Confirmed by Olean Sangster ([id number]) on 06/11/2024 8:35:51 AM    Echo 06/27/21 demonstrated   1. Left ventricular ejection fraction, by estimation, is 50 to 55%. The  left ventricle has low normal function. The left ventricle demonstrates  global hypokinesis. The left ventricular internal cavity size was mildly  dilated. Left ventricular diastolic parameters are indeterminate.   2. Right ventricular systolic function is normal. The right ventricular  size is normal.   3. Left atrial size was severely dilated.   4. Right atrial size was severely dilated.   5. The mitral valve is grossly normal. Mild mitral valve regurgitation.  No evidence of mitral stenosis.   6. The aortic valve is normal in structure. Aortic valve regurgitation is  not visualized. No aortic stenosis is present.   7. Aortic dilatation noted. There is borderline dilatation of the  ascending aorta, measuring 38 mm.   Comparison(s): EF 50%.   Epic records are reviewed at length today   CHA2DS2-VASc Score = 6  The patient's score is based upon: CHF History: 0 HTN History: 1 Diabetes History: 0 Stroke History: 2 (TIA) Vascular Disease History: 1 Age Score: 2 Gender Score: 0        ASSESSMENT AND PLAN: Persistent Atrial Fibrillation (ICD10:  I48.19) The patient's CHA2DS2-VASc score is 6, indicating a 9.7% annual risk of stroke.   S/p afib ablation 09/19/22 He is back in persistent afib today. We discussed rhythm control options today. Will plan for DCCV. Recent labs reviewed. He is hesitant to pursue repeat ablation given his prior groin bleeding. Can consider dofetilide or amiodarone  if he has quick return of afib.  BB discontinued due to fatigue.  Continue Xarelto  20 mg daily  Secondary Hypercoagulable State (ICD10:  D68.69) The patient is at significant risk for stroke/thromboembolism based upon his CHA2DS2-VASc Score of 6.  Continue Rivaroxaban  (Xarelto ). No bleeding issues.   HTN Stable on current regimen  CAD No anginal symptoms   Follow up in the AF clinic post DCCV.    Informed Consent   Shared Decision Making/Informed Consent The risks (stroke, cardiac arrhythmias rarely resulting in the need for a temporary or permanent pacemaker, skin irritation or burns and complications associated with conscious sedation including aspiration, arrhythmia, respiratory failure and death), benefits (restoration of normal sinus rhythm) and alternatives of a direct current  cardioversion were explained in detail to Mr. Palma and he agrees to proceed.       Daril Kicks PA-C [facility] [address] [place], [place] [address] [phone] 06/11/2024 8:59 AM

## 2024-06-12 NOTE — Progress Notes (Signed)
 Pt called for pre procedure instructions.  Instructions reviewed with wife.  Pt unavailable. Arrival time 0915 NPO after midnight explained Instructed to take am meds with sip of water and confirmed blood thinner consistency. Instructed pt need for ride home tomorrow and have responsible adult with them for 24 hrs post procedure.

## 2024-06-13 ENCOUNTER — Ambulatory Visit (HOSPITAL_COMMUNITY): Admitting: Anesthesiology

## 2024-06-13 ENCOUNTER — Other Ambulatory Visit: Payer: Self-pay

## 2024-06-13 ENCOUNTER — Encounter (HOSPITAL_COMMUNITY): Admission: RE | Disposition: A | Payer: Self-pay | Attending: Internal Medicine

## 2024-06-13 ENCOUNTER — Ambulatory Visit (HOSPITAL_COMMUNITY)
Admission: RE | Admit: 2024-06-13 | Discharge: 2024-06-13 | Disposition: A | Attending: Internal Medicine | Admitting: Internal Medicine

## 2024-06-13 DIAGNOSIS — I4819 Other persistent atrial fibrillation: Secondary | ICD-10-CM

## 2024-06-13 DIAGNOSIS — Z006 Encounter for examination for normal comparison and control in clinical research program: Secondary | ICD-10-CM

## 2024-06-13 HISTORY — PX: CARDIOVERSION: EP1203

## 2024-06-13 SURGERY — CARDIOVERSION (CATH LAB)
Anesthesia: Monitor Anesthesia Care

## 2024-06-13 MED ORDER — SODIUM CHLORIDE 0.9 % IV SOLN: INTRAVENOUS | Status: DC

## 2024-06-13 MED ORDER — PROPOFOL 10 MG/ML IV BOLUS
INTRAVENOUS | Status: DC | PRN
  Administered 2024-06-13: 50 mg via INTRAVENOUS

## 2024-06-13 MED ORDER — LIDOCAINE 2% (20 MG/ML) 5 ML SYRINGE
INTRAMUSCULAR | Status: DC | PRN
  Administered 2024-06-13: 50 mg via INTRAVENOUS

## 2024-06-13 SURGICAL SUPPLY — 1 items: PAD DEFIB RADIO PHYSIO CONN (PAD) ×1 IMPLANT

## 2024-06-13 NOTE — Interval H&P Note (Signed)
 History and Physical Interval Note:  06/13/2024 9:56 AM  Matthew Sullivan  has presented today for surgery, with the diagnosis of AFIB.  The various methods of treatment have been discussed with the patient and family. After consideration of risks, benefits and other options for treatment, the patient has consented to  Procedure(s): CARDIOVERSION (N/A) as a surgical intervention.  The patient's history has been reviewed, patient examined, no change in status, stable for surgery.  I have reviewed the patient's chart and labs.  Questions were answered to the patient's satisfaction.     [name]

## 2024-06-13 NOTE — Transfer of Care (Signed)
 Immediate Anesthesia Transfer of Care Note  Patient: Matthew Sullivan  Procedure(s) Performed: CARDIOVERSION  Patient Location: Cath Lab  Anesthesia Type:General  Level of Consciousness: drowsy  Airway & Oxygen  Therapy: Patient Spontanous Breathing and Patient connected to nasal cannula oxygen   Post-op Assessment: Report given to RN and Post -op Vital signs reviewed and stable  Post vital signs: Reviewed and stable  Last Vitals:  Vitals Value Taken Time  BP 150/86 06/13/24 10:00  Temp    Pulse 82 06/13/24 10:01  Resp 15 06/13/24 10:01  SpO2 96 % 06/13/24 10:01  Vitals shown include unfiled device data.  Last Pain:  Vitals:   06/13/24 1001  TempSrc:   PainSc: 0-No pain         Complications: No notable events documented.

## 2024-06-13 NOTE — Discharge Instructions (Signed)

## 2024-06-13 NOTE — Anesthesia Preprocedure Evaluation (Addendum)
 Anesthesia Evaluation  Patient identified by MRN, date of birth, ID band Patient awake    Reviewed: Allergy & Precautions, NPO status , Patient's Chart, lab work & pertinent test results, reviewed documented beta blocker date and time   Airway Mallampati: II  TM Distance: >3 FB Neck ROM: Full    Dental  (+) Dental Advisory Given, Poor Dentition, Chipped, Missing   Pulmonary former smoker   Pulmonary exam normal breath sounds clear to auscultation       Cardiovascular hypertension, Pt. on medications and Pt. on home beta blockers + Past MI  Normal cardiovascular exam+ dysrhythmias (on xarelto ) Atrial Fibrillation  Rhythm:Regular Rate:Normal  TTE 2022 1. Left ventricular ejection fraction, by estimation, is 50 to 55%. The  left ventricle has low normal function. The left ventricle demonstrates  global hypokinesis. The left ventricular internal cavity size was mildly  dilated. Left ventricular diastolic  parameters are indeterminate.  2. Right ventricular systolic function is normal. The right ventricular  size is normal.  3. Left atrial size was severely dilated.  4. Right atrial size was severely dilated.  5. The mitral valve is grossly normal. Mild mitral valve regurgitation.  No evidence of mitral stenosis.  6. The aortic valve is normal in structure. Aortic valve regurgitation is  not visualized. No aortic stenosis is present.  7. Aortic dilatation noted. There is borderline dilatation of the  ascending aorta, measuring 38 mm.    Neuro/Psych TIA negative psych ROS   GI/Hepatic negative GI ROS, Neg liver ROS,,,  Endo/Other  Hypothyroidism    Renal/GU negative Renal ROS  negative genitourinary   Musculoskeletal  (+) Arthritis ,    Abdominal   Peds  Hematology negative hematology ROS (+)   Anesthesia Other Findings   Reproductive/Obstetrics                               Anesthesia Physical Anesthesia Plan  ASA: 3  Anesthesia Plan: General   Post-op Pain Management:    Induction: Intravenous  PONV Risk Score and Plan: 2 and Propofol  infusion and Treatment may vary due to age or medical condition  Airway Management Planned: Natural Airway, Mask and Simple Face Mask  Additional Equipment: None  Intra-op Plan:   Post-operative Plan:   Informed Consent: I have reviewed the patients History and Physical, chart, labs and discussed the procedure including the risks, benefits and alternatives for the proposed anesthesia with the patient or authorized representative who has indicated his/her understanding and acceptance.     Dental advisory given  Plan Discussed with: CRNA and Anesthesiologist  Anesthesia Plan Comments:          Anesthesia Quick Evaluation

## 2024-06-13 NOTE — Research (Signed)
 [facility] Cardioversion Informed Consent   Subject Name: Matthew Sullivan  Subject met inclusion and exclusion criteria.  The informed consent form, study requirements and expectations were reviewed with the subject and questions and concerns were addressed prior to the signing of the consent form.  The subject verbalized understanding of the trial requirements.  The subject agreed to participate in the [facility] Cardioversion trial and signed the informed consent at 0924 on 05/Dec/2025.  The informed consent was obtained prior to performance of any protocol-specific procedures for the subject.  A copy of the signed informed consent was given to the subject and a copy was placed in the subject's medical record.   Rosaline BIRCH Myeasha Ballowe

## 2024-06-13 NOTE — Anesthesia Postprocedure Evaluation (Signed)
 Anesthesia Post Note  Patient: Matthew Sullivan  Procedure(s) Performed: CARDIOVERSION     Patient location during evaluation: PACU Anesthesia Type: General Level of consciousness: awake and alert Pain management: pain level controlled Vital Signs Assessment: post-procedure vital signs reviewed and stable Respiratory status: spontaneous breathing, nonlabored ventilation, respiratory function stable and patient connected to nasal cannula oxygen  Cardiovascular status: blood pressure returned to baseline and stable Postop Assessment: no apparent nausea or vomiting Anesthetic complications: no   No notable events documented.  Last Vitals:  Vitals:   06/13/24 0916 06/13/24 1013  BP: (!) 144/93 118/75  Pulse: (!) 113 (!) 59  Resp: 10 16  Temp: (!) 36.3 C (!) 36.3 C  SpO2: 95% 100%    Last Pain:  Vitals:   06/13/24 1013  TempSrc: Tympanic  PainSc: Asleep                 Jaimeson Gopal

## 2024-06-13 NOTE — CV Procedure (Signed)
 Post Cardioversion Procedure Note  Procedure: Electrical Cardioversion Indications:  Atrial Fibrillation  Procedure Details:  Consent: Risks of procedure as well as the alternatives and risks of each were explained to the (patient/caregiver).  Consent for procedure obtained.  Time Out: Verified patient identification, verified procedure, site/side was marked, verified correct patient position, special equipment/implants available, medications/allergies/relevent history reviewed, required imaging and test results available.  Performed  Patient placed on cardiac monitor, pulse oximetry, supplemental oxygen  as necessary.  Sedation given by anesthesia team.  Pacer pads placed anterior and posterior chest.  Cardioverted 1 time(s).  Cardioversion with synchronized biphasic 200J shock.  Evaluation: Findings: Post procedure EKG shows: sinus bradycardia Complications: None Patient did tolerate procedure well.  Time Spent Directly with the Patient:  25 minutes   Matthew Sullivan Calender 06/13/2024, 10:10 AM

## 2024-06-16 ENCOUNTER — Encounter (HOSPITAL_COMMUNITY): Payer: Self-pay | Admitting: Internal Medicine

## 2024-06-23 ENCOUNTER — Other Ambulatory Visit: Payer: Self-pay | Admitting: Cardiovascular Disease

## 2024-07-07 ENCOUNTER — Ambulatory Visit (HOSPITAL_COMMUNITY)
Admission: RE | Admit: 2024-07-07 | Discharge: 2024-07-07 | Disposition: A | Discharge status: Home / Self Care | Source: Ambulatory Visit | Attending: Physician Assistant | Admitting: Physician Assistant

## 2024-07-07 VITALS — BP 160/76 | HR 56 | Ht 71.5 in | Wt 214.4 lb

## 2024-07-07 DIAGNOSIS — D6869 Other thrombophilia: Secondary | ICD-10-CM | POA: Diagnosis not present

## 2024-07-07 DIAGNOSIS — I4819 Other persistent atrial fibrillation: Secondary | ICD-10-CM

## 2024-07-07 DIAGNOSIS — I10 Essential (primary) hypertension: Secondary | ICD-10-CM | POA: Diagnosis not present

## 2024-07-07 DIAGNOSIS — I4891 Unspecified atrial fibrillation: Secondary | ICD-10-CM | POA: Diagnosis not present

## 2024-07-07 NOTE — Progress Notes (Signed)
 "   Primary Care Physician: Cleatus Arlyss RAMAN, MD Primary Cardiologist: Dr Raford  Primary Electrophysiologist: Dr Inocencio Referring Physician: Dr Raford Matthew Sullivan is a 78 y.o. male with a history of HTN, HLD, atrial myxoma s/p excision 2013, TIA, atrial fibrillation who presents for follow up in the [facility].  He had postoperative atrial fibrillation and had a cardioversion in 2014.  He was started on amiodarone  which was subsequently discontinued. Patient is on Xarelto  for stroke prevention. Patient underwent DCCV on 03/20/22 but returned to afib before leaving the hospital. Patient is s/p DCCV on 04/12/22 which lasted at most 48 hours. Seen by Dr Inocencio and underwent afib ablation on 09/19/22.  Patient had R groin site oozing after leaving the hospital. He had been changing his dressing multiple times and his son who is an EMT held pressure twice for 20 minutes without improvement. He came into the office 09/22/22 and was injected with lidocaine  with epi which resolved the oozing. He was found to be in persistent afib again on follow up 10/17/22 and underwent DCCV on 10/30/22.  Patient returns for follow up for atrial fibrillation. He is s/p DCCV 06/13/24 and remains in SR. His symptoms have resolved. No bleeding issues on anticoagulation.   Today, he  denies symptoms of palpitations, chest pain, shortness of breath, orthopnea, PND, lower extremity edema, dizziness, presyncope, syncope, snoring, daytime somnolence, bleeding, or neurologic sequela. The patient is tolerating medications without difficulties and is otherwise without complaint today.    Atrial Fibrillation Risk Factors:  he does not have symptoms or diagnosis of sleep apnea. he does not have a history of rheumatic fever.  Atrial Fibrillation Management history:  Previous antiarrhythmic drugs: amiodarone , flecainide   Previous cardioversions: 2014, 06/23/21, 03/20/22, 04/12/22, 10/30/22,  06/13/24 Previous ablations: 09/19/22 Anticoagulation history: Xarelto     Past Medical History:  Diagnosis Date   Ascending aortic aneurysm 09/02/2021   Atrial fibrillation, currently in sinus rhythm    Atrial myxoma    Cervical osteoarthritis    History of TIA (transient ischemic attack)    History of tinnitus    Hyperlipidemia    Hypertension    Melanoma (HCC)    per Dr. Ivin, local excision, no chemo/no rady tx.     Current Outpatient Medications  Medication Sig Dispense Refill   acetaminophen  (TYLENOL ) 500 MG tablet Take 1,000 mg by mouth every 6 (six) hours as needed for moderate pain. (Patient taking differently: Take 1,000 mg by mouth at bedtime as needed for mild pain (pain score 1-3) or moderate pain (pain score 4-6).)     atorvastatin  (LIPITOR) 80 MG tablet TAKE 1/2 TABLET (40 MG TOTAL) BY MOUTH DAILY. 45 tablet 1   Fluticasone  Propionate (ALLERGY RELIEF NA) Place 1 spray into the nose at bedtime as needed (allergies).     hydrochlorothiazide  (HYDRODIURIL ) 25 MG tablet TAKE 1 TABLET (25 MG TOTAL) BY MOUTH DAILY. 90 tablet 2   levothyroxine  (SYNTHROID ) 25 MCG tablet Take 1 tablet (25 mcg total) by mouth daily. 90 tablet 3   Menthol-Methyl Salicylate (MUSCLE RUB) 10-15 % CREA Apply 1 Application topically daily as needed for muscle pain.     Multiple Vitamin (MULTIVITAMIN WITH MINERALS) TABS tablet Take 1 tablet by mouth daily.     potassium chloride  SA (KLOR-CON  M) 20 MEQ tablet TAKE 1 TABLET BY MOUTH IN THE MORNING AND 2 TABLETS AT BEDTIME. 45 tablet 0   Saw Palmetto 450 MG CAPS Take 450 mg by mouth  2 (two) times daily.     VYZULTA 0.024 % SOLN Place 1 drop into the left eye at bedtime.     XARELTO  20 MG TABS tablet TAKE 1 TABLET BY MOUTH DAILY WITH SUPPER. 30 tablet 5   No current facility-administered medications for this encounter.    ROS- All systems are reviewed and negative except as per the HPI above.  Physical Exam: Vitals:   07/07/24 0854  BP: (!) 160/76   Pulse: (!) 56  Weight: 97.3 kg  Height: 5' 11.5 (1.816 m)    GEN: Well nourished, well developed in no acute distress CARDIAC: Regular rate and rhythm, no murmurs, rubs, gallops RESPIRATORY:  Clear to auscultation without rales, wheezing or rhonchi  ABDOMEN: Soft, non-tender, non-distended EXTREMITIES:  No edema; No deformity    Wt Readings from Last 3 Encounters:  07/07/24 97.3 kg  06/11/24 95.1 kg  05/27/24 93 kg    EKG Interpretation Date/Time:  Monday July 07 2024 09:01:29 EST Ventricular Rate:  56 PR Interval:  206 QRS Duration:  88 QT Interval:  460 QTC Calculation: 443 R Axis:   54  Text Interpretation: Sinus bradycardia with 1st degree A-V block Otherwise normal ECG When compared with ECG of 13-Jun-2024 10:14, Premature supraventricular complexes are no longer Present Nonspecific T wave abnormality, improved in Lateral leads Confirmed by [employer] ([id number]) on 07/07/2024 9:48:33 AM    Echo 06/27/21 demonstrated   1. Left ventricular ejection fraction, by estimation, is 50 to 55%. The  left ventricle has low normal function. The left ventricle demonstrates  global hypokinesis. The left ventricular internal cavity size was mildly  dilated. Left ventricular diastolic parameters are indeterminate.   2. Right ventricular systolic function is normal. The right ventricular  size is normal.   3. Left atrial size was severely dilated.   4. Right atrial size was severely dilated.   5. The mitral valve is grossly normal. Mild mitral valve regurgitation.  No evidence of mitral stenosis.   6. The aortic valve is normal in structure. Aortic valve regurgitation is  not visualized. No aortic stenosis is present.   7. Aortic dilatation noted. There is borderline dilatation of the  ascending aorta, measuring 38 mm.   Comparison(s): EF 50%.   Epic records are reviewed at length today   CHA2DS2-VASc Score = 6  The patient's score is based upon: CHF History: 0 HTN  History: 1 Diabetes History: 0 Stroke History: 2 (TIA) Vascular Disease History: 1 Age Score: 2 Gender Score: 0       ASSESSMENT AND PLAN: Persistent Atrial Fibrillation (ICD10:  I48.19) The patient's CHA2DS2-VASc score is 6, indicating a 9.7% annual risk of stroke.   S/p afib ablation 09/19/22 S/p DCCV 06/13/24 He appears to be maintaining SR If he has quick return of afib, could consider dofetilide, amiodarone , or repeat ablation. He is hesitant to pursue repeat ablation given prior bleeding but is willing to consider.  BB discontinued previously due to bradycardia and fatigue.  Continue Xarelto  20 mg daily  Secondary Hypercoagulable State (ICD10:  D68.69) The patient is at significant risk for stroke/thromboembolism based upon his CHA2DS2-VASc Score of 6.  Continue Rivaroxaban  (Xarelto ). No bleeding issues.   HTN Elevated today. Well controlled on home readings.  No changes today, continue to monitor.   CAD No anginal symptoms   Follow up in the AF clinic in 6 months.    Daril Kicks PA-C Afib Clinic [facility] [address] [place], [place] [address] [phone]  07/07/2024 10:22 AM "

## 2024-07-14 ENCOUNTER — Other Ambulatory Visit: Payer: Self-pay | Admitting: Cardiovascular Disease

## 2024-07-16 ENCOUNTER — Telehealth: Payer: Self-pay | Admitting: Family

## 2024-07-16 ENCOUNTER — Other Ambulatory Visit: Payer: Self-pay | Admitting: Family Medicine

## 2024-07-16 DIAGNOSIS — I480 Paroxysmal atrial fibrillation: Secondary | ICD-10-CM

## 2024-07-16 NOTE — Telephone Encounter (Signed)
 Copied from CRM [id number]. Topic: Clinical - Medication Refill >> Jul 16, 2024  4:36 PM Matthew Sullivan wrote: Medication: XARELTO  20 MG TABS tablet  Has the patient contacted their pharmacy? Yes (Agent: If no, request that the patient contact the pharmacy for the refill. If patient does not wish to contact the pharmacy document the reason why and proceed with request.) (Agent: If yes, when and what did the pharmacy advise?)  This is the patient's preferred pharmacy:  [facility] - [place], [place] - [address] Matthew Sullivan [place] [address] Phone: [phone] Fax: [phone]  Is this the correct pharmacy for this prescription? Yes If no, delete pharmacy and type the correct one.   Has the prescription been filled recently? Yes  Is the patient out of the medication? Yes  Has the patient been seen for an appointment in the last year OR does the patient have an upcoming appointment? Yes  Can we respond through MyChart? Yes  Agent: Please be advised that Rx refills may take up to 3 business days. We ask that you follow-up with your pharmacy.

## 2024-07-16 NOTE — Telephone Encounter (Signed)
 Copied from CRM [id number]. Topic: Clinical - Medication Refill >> Jul 16, 2024  1:16 PM Pinkey ORN wrote: Medication: XARELTO  20 MG TABS tablet  Has the patient contacted their pharmacy? Yes (Agent: If no, request that the patient contact the pharmacy for the refill. If patient does not wish to contact the pharmacy document the reason why and proceed with request.) (Agent: If yes, when and what did the pharmacy advise?)  This is the patient's preferred pharmacy:  [facility] - [place], [place] - [address] [place] [address] Phone: [phone] Fax: [phone]  Is this the correct pharmacy for this prescription? Yes If no, delete pharmacy and type the correct one.   Has the prescription been filled recently? No  Is the patient out of the medication? Yes  Has the patient been seen for an appointment in the last year OR does the patient have an upcoming appointment? Yes  Can we respond through MyChart? Yes  Agent: Please be advised that Rx refills may take up to 3 business days. We ask that you follow-up with your pharmacy.

## 2024-07-16 NOTE — Telephone Encounter (Signed)
 Xarelto  20mg  refill request received. Pt is 79 years old, weight-97.3kg, Crea-0.93 on 05/27/24, last seen by Quita Kicks on 07/07/24, Diagnosis-Afib, CrCl-87.19 mL/min; Dose is appropriate based on dosing criteria. Will send in refill to requested pharmacy.

## 2024-07-16 NOTE — Telephone Encounter (Signed)
 Error

## 2024-07-28 ENCOUNTER — Other Ambulatory Visit: Payer: Self-pay | Admitting: Family Medicine

## 2024-07-28 ENCOUNTER — Other Ambulatory Visit: Payer: Self-pay | Admitting: Family

## 2024-07-28 DIAGNOSIS — C439 Malignant melanoma of skin, unspecified: Secondary | ICD-10-CM

## 2024-07-28 NOTE — Progress Notes (Signed)
 Patient told me he had melanoma on biopsy and needed derm referral.  I put in referral today. D/w pt.  I asked him to sign a record release today.

## 2024-07-30 ENCOUNTER — Ambulatory Visit: Payer: Self-pay | Admitting: Family Medicine

## 2024-08-01 NOTE — Telephone Encounter (Signed)
 Pt had a lipid panel done on 05/27/2024 within 365 days. Medication sent to pt's pharmacy as requested. Confirmation received.

## 2024-08-02 ENCOUNTER — Telehealth: Payer: Self-pay | Admitting: Cardiovascular Disease

## 2024-08-08 NOTE — Telephone Encounter (Signed)
 Pharmacy calling to f/u on refill. Pt scheduled 4/17

## 2024-08-11 ENCOUNTER — Telehealth: Payer: Self-pay | Admitting: *Deleted

## 2024-08-11 MED ORDER — POTASSIUM CHLORIDE CRYS ER 20 MEQ PO TBCR: EXTENDED_RELEASE_TABLET | ORAL | 0 refills | Status: AC

## 2024-08-11 NOTE — Addendum Note (Signed)
 Addended by: MEMORY DELON POUR on: 08/11/2024 07:25 AM   Modules accepted: Orders

## 2024-08-13 ENCOUNTER — Ambulatory Visit (HOSPITAL_COMMUNITY)
Admission: RE | Admit: 2024-08-13 | Discharge: 2024-08-13 | Disposition: A | Source: Ambulatory Visit | Attending: Physician Assistant | Admitting: Physician Assistant

## 2024-08-13 VITALS — BP 150/62 | HR 100 | Ht 71.5 in | Wt 211.4 lb

## 2024-08-13 DIAGNOSIS — Z7189 Other specified counseling: Secondary | ICD-10-CM

## 2024-08-13 DIAGNOSIS — D6869 Other thrombophilia: Secondary | ICD-10-CM

## 2024-08-13 DIAGNOSIS — I4891 Unspecified atrial fibrillation: Secondary | ICD-10-CM

## 2024-08-13 DIAGNOSIS — I4819 Other persistent atrial fibrillation: Secondary | ICD-10-CM | POA: Diagnosis not present

## 2024-08-13 NOTE — Patient Instructions (Signed)
 Dr Inocencio office will contact you regarding ablation scheduling/office visit

## 2024-08-13 NOTE — Progress Notes (Signed)
 "   Primary Care Physician: Cleatus Arlyss RAMAN, MD Primary Cardiologist: Dr Raford  Primary Electrophysiologist: Dr Inocencio Referring Physician: Dr Raford Matthew Sullivan is a 79 y.o. male with a history of HTN, HLD, atrial myxoma s/p excision 2013, TIA, atrial fibrillation who presents for follow up in the [facility].  He had postoperative atrial fibrillation and had a cardioversion in 2014.  He was started on amiodarone  which was subsequently discontinued. Patient is on Xarelto  for stroke prevention. Patient underwent DCCV on 03/20/22 but returned to afib before leaving the hospital. Patient is s/p DCCV on 04/12/22 which lasted at most 48 hours. Seen by Dr Inocencio and underwent afib ablation on 09/19/22.  Patient had R groin site oozing after leaving the hospital. He had been changing his dressing multiple times and his son who is an EMT held pressure twice for 20 minutes without improvement. He came into the office 09/22/22 and was injected with lidocaine  with epi which resolved the oozing. He was found to be in persistent afib again on follow up 10/17/22 and underwent DCCV on 10/30/22. Patient went back into persistent afib and is s/p DCCV 06/13/24.  Patient returns for follow up for atrial fibrillation. Discussed the use of AI scribe software for clinical note transcription with the patient, who gave verbal consent to proceed.  He experienced a sudden onset of symptoms last Friday while watching TV, including a sensation of his heart racing and dizziness on standing. He contacted his son, [name], who is a radiation protection practitioner, for advice. He previously underwent cardioversion in December and maintained normal rhythm until the recent episode.  He continues to engage in physical activities such as shoveling snow. He wants to continue working despite his symptoms.  He has an upcoming melanoma surgery scheduled for February 23rd but does not need to hold anticoagulation.      Today,  he  denies symptoms of chest pain, shortness of breath, orthopnea, PND, lower extremity edema, presyncope, syncope, snoring, daytime somnolence, bleeding, or neurologic sequela. The patient is tolerating medications without difficulties and is otherwise without complaint today.    Atrial Fibrillation Risk Factors:  he does not have symptoms or diagnosis of sleep apnea. he does not have a history of rheumatic fever.  Atrial Fibrillation Management history:  Previous antiarrhythmic drugs: amiodarone , flecainide   Previous cardioversions: 2014, 06/23/21, 03/20/22, 04/12/22, 10/30/22, 06/13/24 Previous ablations: 09/19/22 Anticoagulation history: Xarelto     Past Medical History:  Diagnosis Date   Ascending aortic aneurysm 09/02/2021   Atrial fibrillation, currently in sinus rhythm    Atrial myxoma    Cervical osteoarthritis    History of TIA (transient ischemic attack)    History of tinnitus    Hyperlipidemia    Hypertension    Melanoma (HCC)    per Dr. Ivin, local excision, no chemo/no rady tx.     Current Outpatient Medications  Medication Sig Dispense Refill   acetaminophen  (TYLENOL ) 500 MG tablet Take 1,000 mg by mouth every 6 (six) hours as needed for moderate pain. (Patient taking differently: Take 1,000 mg by mouth as needed for mild pain (pain score 1-3) or moderate pain (pain score 4-6).)     atorvastatin  (LIPITOR) 80 MG tablet TAKE 1/2 TABLET (40 MG TOTAL) BY MOUTH DAILY. 45 tablet 0   Fluticasone  Propionate (ALLERGY RELIEF NA) Place 1 spray into the nose at bedtime as needed (allergies). (Patient taking differently: Place 1 spray into the nose as needed (allergies).)     hydrochlorothiazide  (  HYDRODIURIL ) 25 MG tablet TAKE 1 TABLET (25 MG TOTAL) BY MOUTH DAILY. 90 tablet 2   levothyroxine  (SYNTHROID ) 25 MCG tablet Take 1 tablet (25 mcg total) by mouth daily. 90 tablet 3   Menthol-Methyl Salicylate (MUSCLE RUB) 10-15 % CREA Apply 1 Application topically daily as needed for muscle  pain. (Patient taking differently: Apply 1 Application topically as needed for muscle pain.)     Multiple Vitamin (MULTIVITAMIN WITH MINERALS) TABS tablet Take 1 tablet by mouth daily.     Saw Palmetto 450 MG CAPS Take 450 mg by mouth 2 (two) times daily.     VYZULTA 0.024 % SOLN Place 1 drop into the left eye at bedtime.     XARELTO  20 MG TABS tablet TAKE 1 TABLET BY MOUTH DAILY WITH SUPPER. 30 tablet 5   potassium chloride  SA (KLOR-CON  M) 20 MEQ tablet TAKE 1 TABLET BY MOUTH IN THE MORNING AND 2 TABLETS AT BEDTIME. NEED TO SCHEDULE FOLLOW UP APPOINTMENT FOR REFILLS 225 tablet 0   No current facility-administered medications for this encounter.    ROS- All systems are reviewed and negative except as per the HPI above.  Physical Exam: Vitals:   08/13/24 1513  BP: (!) 150/62  Pulse: 100  Weight: 95.9 kg  Height: 5' 11.5 (1.816 m)    GEN: Well nourished, well developed in no acute distress CARDIAC: Irregularly irregular rate and rhythm, no murmurs, rubs, gallops RESPIRATORY:  Clear to auscultation without rales, wheezing or rhonchi  ABDOMEN: Soft, non-tender, non-distended EXTREMITIES:  No edema; No deformity    Wt Readings from Last 3 Encounters:  08/13/24 95.9 kg  07/07/24 97.3 kg  06/11/24 95.1 kg    EKG Interpretation Date/Time:  Wednesday August 13 2024 15:25:48 EST Ventricular Rate:  100 PR Interval:    QRS Duration:  86 QT Interval:  372 QTC Calculation: 479 R Axis:   66  Text Interpretation: Atrial fibrillation Nonspecific ST and T wave abnormality Abnormal ECG When compared with ECG of 07-Jul-2024 09:01, Atrial fibrillation has replaced Sinus rhythm Confirmed by Shikha Bibb ([id number]) on 08/13/2024 3:27:34 PM    Echo 06/27/21 demonstrated   1. Left ventricular ejection fraction, by estimation, is 50 to 55%. The  left ventricle has low normal function. The left ventricle demonstrates  global hypokinesis. The left ventricular internal cavity size was mildly   dilated. Left ventricular diastolic parameters are indeterminate.   2. Right ventricular systolic function is normal. The right ventricular  size is normal.   3. Left atrial size was severely dilated.   4. Right atrial size was severely dilated.   5. The mitral valve is grossly normal. Mild mitral valve regurgitation.  No evidence of mitral stenosis.   6. The aortic valve is normal in structure. Aortic valve regurgitation is  not visualized. No aortic stenosis is present.   7. Aortic dilatation noted. There is borderline dilatation of the  ascending aorta, measuring 38 mm.   Comparison(s): EF 50%.   Epic records are reviewed at length today   CHA2DS2-VASc Score = 6  The patient's score is based upon: CHF History: 0 HTN History: 1 Diabetes History: 0 Stroke History: 2 (TIA) Vascular Disease History: 1 Age Score: 2 Gender Score: 0       ASSESSMENT AND PLAN: Persistent Atrial Fibrillation (ICD10:  I48.19) The patient's CHA2DS2-VASc score is 6, indicating a 9.7% annual risk of stroke.   S/p afib ablation 09/19/22 S/p DCCV 06/13/24, now back in afib.  We discussed rhythm control  options including dofetilide, amiodarone  and repeat ablation. He would favor repeat ablation. Will reach out to EP office to get him on Dr Inocencio ablation schedule. Patient would prefer an in person office visit with Dr Inocencio prior to the ablation.  Continue Xarelto  20 mg daily BB previously discontinued due to bradycardia and fatigue.   Secondary Hypercoagulable State (ICD10:  D68.69) The patient is at significant risk for stroke/thromboembolism based upon his CHA2DS2-VASc Score of 6.  Continue Rivaroxaban  (Xarelto ). No bleeding issues.   HTN Mildly elevated today, will reassess in SR.   CAD No anginal symptoms   Follow up pending scheduling of ablation.    Daril Kicks PA-C Afib Clinic [facility] [address] [place], [place] [address] [phone] 08/13/2024 3:29 PM "

## 2024-10-24 ENCOUNTER — Ambulatory Visit (HOSPITAL_BASED_OUTPATIENT_CLINIC_OR_DEPARTMENT_OTHER): Admitting: Family
